# Patient Record
Sex: Male | Born: 1940
Health system: Southern US, Community
[De-identification: ages and names within clinical notes are randomized; demographics above are authoritative.]

## PROBLEM LIST (undated history)

## (undated) DIAGNOSIS — D494 Neoplasm of unspecified behavior of bladder: Secondary | ICD-10-CM

## (undated) DIAGNOSIS — C61 Malignant neoplasm of prostate: Secondary | ICD-10-CM

## (undated) DIAGNOSIS — A770 Spotted fever due to Rickettsia rickettsii: Secondary | ICD-10-CM

## (undated) DIAGNOSIS — C801 Malignant (primary) neoplasm, unspecified: Secondary | ICD-10-CM

## (undated) DIAGNOSIS — K08109 Complete loss of teeth, unspecified cause, unspecified class: Secondary | ICD-10-CM

## (undated) DIAGNOSIS — Z8619 Personal history of other infectious and parasitic diseases: Secondary | ICD-10-CM

## (undated) DIAGNOSIS — K219 Gastro-esophageal reflux disease without esophagitis: Secondary | ICD-10-CM

## (undated) DIAGNOSIS — M1990 Unspecified osteoarthritis, unspecified site: Secondary | ICD-10-CM

## (undated) DIAGNOSIS — H9192 Unspecified hearing loss, left ear: Secondary | ICD-10-CM

## (undated) DIAGNOSIS — Z973 Presence of spectacles and contact lenses: Secondary | ICD-10-CM

## (undated) DIAGNOSIS — I10 Essential (primary) hypertension: Secondary | ICD-10-CM

## (undated) DIAGNOSIS — B029 Zoster without complications: Secondary | ICD-10-CM

## (undated) DIAGNOSIS — R7303 Prediabetes: Secondary | ICD-10-CM

## (undated) DIAGNOSIS — Z974 Presence of external hearing-aid: Secondary | ICD-10-CM

## (undated) DIAGNOSIS — N401 Enlarged prostate with lower urinary tract symptoms: Secondary | ICD-10-CM

## (undated) HISTORY — PX: SHOULDER ARTHROSCOPY WITH ROTATOR CUFF REPAIR AND SUBACROMIAL DECOMPRESSION: SHX5686

## (undated) HISTORY — PX: TRANSURETHRAL RESECTION OF PROSTATE: SHX73

## (undated) HISTORY — PX: SHOULDER ARTHROSCOPY: SHX128

---
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
## 2008-06-14 ENCOUNTER — Emergency Department (HOSPITAL_COMMUNITY): Admission: EM | Admit: 2008-06-14 | Discharge: 2008-06-14 | Payer: Self-pay | Admitting: Emergency Medicine

## 2015-12-26 DIAGNOSIS — D2239 Melanocytic nevi of other parts of face: Secondary | ICD-10-CM | POA: Diagnosis not present

## 2015-12-26 DIAGNOSIS — D224 Melanocytic nevi of scalp and neck: Secondary | ICD-10-CM | POA: Diagnosis not present

## 2015-12-26 DIAGNOSIS — L821 Other seborrheic keratosis: Secondary | ICD-10-CM | POA: Diagnosis not present

## 2015-12-26 DIAGNOSIS — D225 Melanocytic nevi of trunk: Secondary | ICD-10-CM | POA: Diagnosis not present

## 2015-12-26 DIAGNOSIS — L570 Actinic keratosis: Secondary | ICD-10-CM | POA: Diagnosis not present

## 2015-12-26 DIAGNOSIS — Z85828 Personal history of other malignant neoplasm of skin: Secondary | ICD-10-CM | POA: Diagnosis not present

## 2015-12-26 DIAGNOSIS — L814 Other melanin hyperpigmentation: Secondary | ICD-10-CM | POA: Diagnosis not present

## 2015-12-26 DIAGNOSIS — C44519 Basal cell carcinoma of skin of other part of trunk: Secondary | ICD-10-CM | POA: Diagnosis not present

## 2016-02-12 DIAGNOSIS — E559 Vitamin D deficiency, unspecified: Secondary | ICD-10-CM | POA: Diagnosis not present

## 2016-02-12 DIAGNOSIS — I10 Essential (primary) hypertension: Secondary | ICD-10-CM | POA: Diagnosis not present

## 2016-02-12 DIAGNOSIS — R7301 Impaired fasting glucose: Secondary | ICD-10-CM | POA: Diagnosis not present

## 2016-02-14 DIAGNOSIS — K219 Gastro-esophageal reflux disease without esophagitis: Secondary | ICD-10-CM | POA: Diagnosis not present

## 2016-02-14 DIAGNOSIS — R7301 Impaired fasting glucose: Secondary | ICD-10-CM | POA: Diagnosis not present

## 2016-02-14 DIAGNOSIS — I10 Essential (primary) hypertension: Secondary | ICD-10-CM | POA: Diagnosis not present

## 2016-06-10 DIAGNOSIS — Z0100 Encounter for examination of eyes and vision without abnormal findings: Secondary | ICD-10-CM | POA: Diagnosis not present

## 2016-06-10 DIAGNOSIS — H524 Presbyopia: Secondary | ICD-10-CM | POA: Diagnosis not present

## 2016-06-24 DIAGNOSIS — Z85828 Personal history of other malignant neoplasm of skin: Secondary | ICD-10-CM | POA: Diagnosis not present

## 2016-06-24 DIAGNOSIS — L821 Other seborrheic keratosis: Secondary | ICD-10-CM | POA: Diagnosis not present

## 2016-06-24 DIAGNOSIS — L814 Other melanin hyperpigmentation: Secondary | ICD-10-CM | POA: Diagnosis not present

## 2016-06-24 DIAGNOSIS — L570 Actinic keratosis: Secondary | ICD-10-CM | POA: Diagnosis not present

## 2016-08-24 DIAGNOSIS — E559 Vitamin D deficiency, unspecified: Secondary | ICD-10-CM | POA: Diagnosis not present

## 2016-08-24 DIAGNOSIS — E782 Mixed hyperlipidemia: Secondary | ICD-10-CM | POA: Diagnosis not present

## 2016-08-24 DIAGNOSIS — R7301 Impaired fasting glucose: Secondary | ICD-10-CM | POA: Diagnosis not present

## 2016-08-26 DIAGNOSIS — I10 Essential (primary) hypertension: Secondary | ICD-10-CM | POA: Diagnosis not present

## 2016-08-26 DIAGNOSIS — Z6821 Body mass index (BMI) 21.0-21.9, adult: Secondary | ICD-10-CM | POA: Diagnosis not present

## 2016-08-26 DIAGNOSIS — R7301 Impaired fasting glucose: Secondary | ICD-10-CM | POA: Diagnosis not present

## 2016-08-26 DIAGNOSIS — K219 Gastro-esophageal reflux disease without esophagitis: Secondary | ICD-10-CM | POA: Diagnosis not present

## 2016-11-11 DIAGNOSIS — R05 Cough: Secondary | ICD-10-CM | POA: Diagnosis not present

## 2016-11-11 DIAGNOSIS — J060 Acute laryngopharyngitis: Secondary | ICD-10-CM | POA: Diagnosis not present

## 2016-12-29 DIAGNOSIS — D692 Other nonthrombocytopenic purpura: Secondary | ICD-10-CM | POA: Diagnosis not present

## 2016-12-29 DIAGNOSIS — D225 Melanocytic nevi of trunk: Secondary | ICD-10-CM | POA: Diagnosis not present

## 2016-12-29 DIAGNOSIS — L814 Other melanin hyperpigmentation: Secondary | ICD-10-CM | POA: Diagnosis not present

## 2016-12-29 DIAGNOSIS — L570 Actinic keratosis: Secondary | ICD-10-CM | POA: Diagnosis not present

## 2016-12-29 DIAGNOSIS — L821 Other seborrheic keratosis: Secondary | ICD-10-CM | POA: Diagnosis not present

## 2016-12-29 DIAGNOSIS — Z85828 Personal history of other malignant neoplasm of skin: Secondary | ICD-10-CM | POA: Diagnosis not present

## 2017-02-22 DIAGNOSIS — R7301 Impaired fasting glucose: Secondary | ICD-10-CM | POA: Diagnosis not present

## 2017-02-22 DIAGNOSIS — E559 Vitamin D deficiency, unspecified: Secondary | ICD-10-CM | POA: Diagnosis not present

## 2017-02-22 DIAGNOSIS — I10 Essential (primary) hypertension: Secondary | ICD-10-CM | POA: Diagnosis not present

## 2017-02-22 DIAGNOSIS — R972 Elevated prostate specific antigen [PSA]: Secondary | ICD-10-CM | POA: Diagnosis not present

## 2017-02-24 DIAGNOSIS — R7301 Impaired fasting glucose: Secondary | ICD-10-CM | POA: Diagnosis not present

## 2017-02-24 DIAGNOSIS — I10 Essential (primary) hypertension: Secondary | ICD-10-CM | POA: Diagnosis not present

## 2017-02-24 DIAGNOSIS — K219 Gastro-esophageal reflux disease without esophagitis: Secondary | ICD-10-CM | POA: Diagnosis not present

## 2017-02-24 DIAGNOSIS — Z0000 Encounter for general adult medical examination without abnormal findings: Secondary | ICD-10-CM | POA: Diagnosis not present

## 2017-02-24 DIAGNOSIS — Z6821 Body mass index (BMI) 21.0-21.9, adult: Secondary | ICD-10-CM | POA: Diagnosis not present

## 2017-06-10 DIAGNOSIS — H524 Presbyopia: Secondary | ICD-10-CM | POA: Diagnosis not present

## 2017-06-29 DIAGNOSIS — L814 Other melanin hyperpigmentation: Secondary | ICD-10-CM | POA: Diagnosis not present

## 2017-06-29 DIAGNOSIS — L578 Other skin changes due to chronic exposure to nonionizing radiation: Secondary | ICD-10-CM | POA: Diagnosis not present

## 2017-06-29 DIAGNOSIS — D692 Other nonthrombocytopenic purpura: Secondary | ICD-10-CM | POA: Diagnosis not present

## 2017-06-29 DIAGNOSIS — L821 Other seborrheic keratosis: Secondary | ICD-10-CM | POA: Diagnosis not present

## 2017-06-29 DIAGNOSIS — L570 Actinic keratosis: Secondary | ICD-10-CM | POA: Diagnosis not present

## 2017-06-29 DIAGNOSIS — L430 Hypertrophic lichen planus: Secondary | ICD-10-CM | POA: Diagnosis not present

## 2017-06-29 DIAGNOSIS — D225 Melanocytic nevi of trunk: Secondary | ICD-10-CM | POA: Diagnosis not present

## 2017-06-29 DIAGNOSIS — Z85828 Personal history of other malignant neoplasm of skin: Secondary | ICD-10-CM | POA: Diagnosis not present

## 2017-06-29 DIAGNOSIS — D485 Neoplasm of uncertain behavior of skin: Secondary | ICD-10-CM | POA: Diagnosis not present

## 2017-06-29 DIAGNOSIS — L720 Epidermal cyst: Secondary | ICD-10-CM | POA: Diagnosis not present

## 2017-08-25 DIAGNOSIS — I10 Essential (primary) hypertension: Secondary | ICD-10-CM | POA: Diagnosis not present

## 2017-08-25 DIAGNOSIS — R972 Elevated prostate specific antigen [PSA]: Secondary | ICD-10-CM | POA: Diagnosis not present

## 2017-08-25 DIAGNOSIS — R7301 Impaired fasting glucose: Secondary | ICD-10-CM | POA: Diagnosis not present

## 2017-08-27 DIAGNOSIS — R972 Elevated prostate specific antigen [PSA]: Secondary | ICD-10-CM | POA: Diagnosis not present

## 2017-08-27 DIAGNOSIS — I10 Essential (primary) hypertension: Secondary | ICD-10-CM | POA: Diagnosis not present

## 2017-08-27 DIAGNOSIS — K219 Gastro-esophageal reflux disease without esophagitis: Secondary | ICD-10-CM | POA: Diagnosis not present

## 2017-08-27 DIAGNOSIS — R7301 Impaired fasting glucose: Secondary | ICD-10-CM | POA: Diagnosis not present

## 2018-01-05 DIAGNOSIS — L821 Other seborrheic keratosis: Secondary | ICD-10-CM | POA: Diagnosis not present

## 2018-01-05 DIAGNOSIS — L570 Actinic keratosis: Secondary | ICD-10-CM | POA: Diagnosis not present

## 2018-01-05 DIAGNOSIS — Z85828 Personal history of other malignant neoplasm of skin: Secondary | ICD-10-CM | POA: Diagnosis not present

## 2018-01-05 DIAGNOSIS — D225 Melanocytic nevi of trunk: Secondary | ICD-10-CM | POA: Diagnosis not present

## 2018-01-05 DIAGNOSIS — L814 Other melanin hyperpigmentation: Secondary | ICD-10-CM | POA: Diagnosis not present

## 2018-02-23 DIAGNOSIS — R972 Elevated prostate specific antigen [PSA]: Secondary | ICD-10-CM | POA: Diagnosis not present

## 2018-02-23 DIAGNOSIS — R7301 Impaired fasting glucose: Secondary | ICD-10-CM | POA: Diagnosis not present

## 2018-02-23 DIAGNOSIS — I10 Essential (primary) hypertension: Secondary | ICD-10-CM | POA: Diagnosis not present

## 2018-02-23 DIAGNOSIS — E559 Vitamin D deficiency, unspecified: Secondary | ICD-10-CM | POA: Diagnosis not present

## 2018-02-25 DIAGNOSIS — K219 Gastro-esophageal reflux disease without esophagitis: Secondary | ICD-10-CM | POA: Diagnosis not present

## 2018-02-25 DIAGNOSIS — Z0000 Encounter for general adult medical examination without abnormal findings: Secondary | ICD-10-CM | POA: Diagnosis not present

## 2018-02-25 DIAGNOSIS — R972 Elevated prostate specific antigen [PSA]: Secondary | ICD-10-CM | POA: Diagnosis not present

## 2018-02-25 DIAGNOSIS — I10 Essential (primary) hypertension: Secondary | ICD-10-CM | POA: Diagnosis not present

## 2018-02-25 DIAGNOSIS — R7301 Impaired fasting glucose: Secondary | ICD-10-CM | POA: Diagnosis not present

## 2018-03-11 DIAGNOSIS — K219 Gastro-esophageal reflux disease without esophagitis: Secondary | ICD-10-CM | POA: Diagnosis not present

## 2018-03-11 DIAGNOSIS — R972 Elevated prostate specific antigen [PSA]: Secondary | ICD-10-CM | POA: Diagnosis not present

## 2018-03-11 DIAGNOSIS — I10 Essential (primary) hypertension: Secondary | ICD-10-CM | POA: Diagnosis not present

## 2018-03-11 DIAGNOSIS — Z0000 Encounter for general adult medical examination without abnormal findings: Secondary | ICD-10-CM | POA: Diagnosis not present

## 2018-03-11 DIAGNOSIS — R7301 Impaired fasting glucose: Secondary | ICD-10-CM | POA: Diagnosis not present

## 2018-03-11 DIAGNOSIS — Z6821 Body mass index (BMI) 21.0-21.9, adult: Secondary | ICD-10-CM | POA: Diagnosis not present

## 2018-03-11 DIAGNOSIS — R509 Fever, unspecified: Secondary | ICD-10-CM | POA: Diagnosis not present

## 2018-03-24 DIAGNOSIS — I10 Essential (primary) hypertension: Secondary | ICD-10-CM | POA: Diagnosis not present

## 2018-03-24 DIAGNOSIS — R7301 Impaired fasting glucose: Secondary | ICD-10-CM | POA: Diagnosis not present

## 2018-03-24 DIAGNOSIS — Z0000 Encounter for general adult medical examination without abnormal findings: Secondary | ICD-10-CM | POA: Diagnosis not present

## 2018-03-24 DIAGNOSIS — R972 Elevated prostate specific antigen [PSA]: Secondary | ICD-10-CM | POA: Diagnosis not present

## 2018-03-24 DIAGNOSIS — K219 Gastro-esophageal reflux disease without esophagitis: Secondary | ICD-10-CM | POA: Diagnosis not present

## 2018-03-27 ENCOUNTER — Encounter (HOSPITAL_COMMUNITY): Payer: Self-pay | Admitting: Emergency Medicine

## 2018-03-27 ENCOUNTER — Emergency Department (HOSPITAL_COMMUNITY)
Admission: EM | Admit: 2018-03-27 | Discharge: 2018-03-27 | Disposition: A | Discharge status: Home / Self Care | Payer: Medicare HMO | Attending: Emergency Medicine | Admitting: Emergency Medicine

## 2018-03-27 ENCOUNTER — Emergency Department (HOSPITAL_COMMUNITY): Payer: Medicare HMO

## 2018-03-27 ENCOUNTER — Other Ambulatory Visit: Payer: Self-pay

## 2018-03-27 DIAGNOSIS — R202 Paresthesia of skin: Secondary | ICD-10-CM | POA: Insufficient documentation

## 2018-03-27 DIAGNOSIS — Z79899 Other long term (current) drug therapy: Secondary | ICD-10-CM | POA: Diagnosis not present

## 2018-03-27 DIAGNOSIS — Z87891 Personal history of nicotine dependence: Secondary | ICD-10-CM | POA: Insufficient documentation

## 2018-03-27 DIAGNOSIS — I10 Essential (primary) hypertension: Secondary | ICD-10-CM | POA: Insufficient documentation

## 2018-03-27 DIAGNOSIS — R200 Anesthesia of skin: Secondary | ICD-10-CM | POA: Diagnosis present

## 2018-03-27 HISTORY — DX: Gastro-esophageal reflux disease without esophagitis: K21.9

## 2018-03-27 HISTORY — DX: Spotted fever due to Rickettsia rickettsii: A77.0

## 2018-03-27 HISTORY — DX: Essential (primary) hypertension: I10

## 2018-03-27 LAB — COMPREHENSIVE METABOLIC PANEL
ALBUMIN: 4.2 g/dL (ref 3.5–5.0)
ALK PHOS: 97 U/L (ref 38–126)
ALT: 19 U/L (ref 17–63)
AST: 17 U/L (ref 15–41)
Anion gap: 8 (ref 5–15)
BILIRUBIN TOTAL: 0.9 mg/dL (ref 0.3–1.2)
BUN: 14 mg/dL (ref 6–20)
CALCIUM: 9 mg/dL (ref 8.9–10.3)
CO2: 31 mmol/L (ref 22–32)
Chloride: 94 mmol/L — ABNORMAL LOW (ref 101–111)
Creatinine, Ser: 0.72 mg/dL (ref 0.61–1.24)
GFR calc Af Amer: >60 mL/min (ref >60)
Glucose, Bld: 117 mg/dL — ABNORMAL HIGH (ref 65–99)
Potassium: 3.5 mmol/L (ref 3.5–5.1)
Sodium: 133 mmol/L — ABNORMAL LOW (ref 135–145)
TOTAL PROTEIN: 7.1 g/dL (ref 6.5–8.1)

## 2018-03-27 LAB — CBC WITH DIFFERENTIAL/PLATELET
BASOS PCT: 0 %
Basophils Absolute: 0 10*3/uL (ref 0.0–0.1)
Eosinophils Absolute: 0.1 10*3/uL (ref 0.0–0.7)
Eosinophils Relative: 2 %
HEMATOCRIT: 45.3 % (ref 39.0–52.0)
HEMOGLOBIN: 15.2 g/dL (ref 13.0–17.0)
LYMPHS PCT: 28 %
Lymphs Abs: 1.7 10*3/uL (ref 0.7–4.0)
MCH: 30.2 pg (ref 26.0–34.0)
MCHC: 33.6 g/dL (ref 30.0–36.0)
MCV: 90.1 fL (ref 78.0–100.0)
MONOS PCT: 14 %
Monocytes Absolute: 0.9 10*3/uL (ref 0.1–1.0)
NEUTROS ABS: 3.5 10*3/uL (ref 1.7–7.7)
NEUTROS PCT: 56 %
Platelets: 337 10*3/uL (ref 150–400)
RBC: 5.03 MIL/uL (ref 4.22–5.81)
RDW: 13.2 % (ref 11.5–15.5)
WBC: 6.2 10*3/uL (ref 4.0–10.5)

## 2018-03-27 NOTE — Discharge Instructions (Addendum)
Blood work and CT scan of head and neck showed no life-threatening condition.  Continue your antibiotic.  Follow-up with primary care for further testing

## 2018-03-27 NOTE — ED Notes (Signed)
EDP at bedside  

## 2018-03-27 NOTE — ED Provider Notes (Signed)
[facility] EMERGENCY DEPARTMENT Provider Note   CSN: [id number] Arrival date & time: 03/27/18  0350     History   Chief Complaint Chief Complaint  Patient presents with  . Numbness    HPI Noah Burnett is a 77 y.o. male.  Patient presents with intermittent tingling/numbness in his left fingers/upper extremity for several days.  Past medical history significant for a tick bite on April 24 and subsequent fever and headache on May 2.  He had primary care doctor visits on May 2 May 16.  Blood work was drawn at that time.  He was started on doxycycline this past Thursday (3 days ago) because his "blood work was positive for RMSF".  No fever, chills, rash, headache at this time.  He is alert with no facial asymmetry, confusion, stiff neck, lower extremity weakness.  Family is wondering if this could be related to his recent diagnosis.  Severity of symptoms is mild to moderate.  Nothing makes symptoms better or worse.     Past Medical History:  Diagnosis Date  . GERD (gastroesophageal reflux disease)   . Hypertension   . [facility] spotted fever     There are no active problems to display for this patient.   Past Surgical History:  Procedure Laterality Date  . SHOULDER ARTHROSCOPY          Home Medications    Prior to Admission medications   Medication Sig Start Date End Date Taking? Authorizing Provider  amLODipine (NORVASC) 2.5 MG tablet Take 2.5 mg by mouth daily.  02/25/18  Yes [provider]  Cholecalciferol (VITAMIN D3) 1000 units CAPS Take 1 capsule by mouth daily.   Yes [provider]  doxycycline (ADOXA) 100 MG tablet Take 100 mg by mouth every 12 (twelve) hours. For 20 days. 03/24/18  Yes [provider]  hydrochlorothiazide (MICROZIDE) 12.5 MG capsule Take 12.5 mg by mouth daily.  02/25/18  Yes [provider]  omeprazole (PRILOSEC) 40 MG capsule Take 40 mg by mouth daily.  02/25/18  Yes [provider]     Family History History reviewed. No pertinent family history.  Social History Social History   Tobacco Use  . Smoking status: Former Research scientist (life sciences)  . Smokeless tobacco: Never Used  Substance Use Topics  . Alcohol use: Never    Frequency: Never  . Drug use: Never     Allergies   Patient has no known allergies.   Review of Systems Review of Systems  All other systems reviewed and are negative.    Physical Exam Updated Vital Signs BP 138/74   Pulse (!) 52   Temp (!) 97.3 F (36.3 C) (Oral)   Resp 16   Ht 5\' 7"  (1.702 m)   Wt 63.5 kg (140 lb)   SpO2 100%   BMI 21.93 kg/m   Physical Exam  Constitutional: He is oriented to person, place, and time. He appears well-developed and well-nourished.  HENT:  Head: Normocephalic and atraumatic.  Eyes: Conjunctivae are normal.  Neck: Neck supple.  Cardiovascular: Normal rate and regular rhythm.  Pulmonary/Chest: Effort normal and breath sounds normal.  Abdominal: Soft. Bowel sounds are normal.  Musculoskeletal: Normal range of motion.  Neurological: He is alert and oriented to person, place, and time.  Motor and sensory intact throughout.  Full range of motion of the left upper extremity.  Sensation intact.  Skin: Skin is warm and dry.  Psychiatric: He has a normal mood and affect. His behavior is normal.  Nursing note and vitals reviewed.    ED Treatments / Results  Labs (all labs ordered are listed, but only abnormal results are displayed) Labs Reviewed  COMPREHENSIVE METABOLIC PANEL - Abnormal; Notable for the following components:      Result Value   Sodium 133 (*)    Chloride 94 (*)    Glucose, Bld 117 (*)    All other components within normal limits  CBC WITH DIFFERENTIAL/PLATELET  ROCKY MTN SPOTTED FVR ABS PNL(IGG+IGM)  B. BURGDORFI ANTIBODIES    EKG EKG Interpretation  Date/Time:  Sunday Mar 27 2018 09:57:56 EDT Ventricular Rate:  63 PR Interval:    QRS Duration: 108 QT Interval:  426 QTC  Calculation: 437 R Axis:   63 Text Interpretation:  Sinus rhythm Baseline wander in lead(s) V1 Confirmed by Nat Christen [phone]) on 03/27/2018 10:32:53 AM   Radiology Ct Head Wo Contrast  Result Date: 03/27/2018 CLINICAL DATA:  Reports diagnosed with [place] spotted fever 1 week ago. Left arm numbness. EXAM: CT HEAD WITHOUT CONTRAST CT CERVICAL SPINE WITHOUT CONTRAST TECHNIQUE: Multidetector CT imaging of the head and cervical spine was performed following the standard protocol without intravenous contrast. Multiplanar CT image reconstructions of the cervical spine were also generated. COMPARISON:  None. FINDINGS: CT HEAD FINDINGS Brain: Ventricles, cisterns and other CSF spaces are within normal. There is mild chronic ischemic microvascular disease. Small old lacunar infarct over the inferior left lentiform nucleus. No mass, mass effect, shift of midline structures or acute hemorrhage. No evidence of acute infarction. Vascular: No hyperdense vessel or unexpected calcification. Skull: Normal. Negative for fracture or focal lesion. Sinuses/Orbits: No acute finding. Other: None. CT CERVICAL SPINE FINDINGS Alignment: Normal. Skull base and vertebrae: Vertebral body heights are normal. There is mild spondylosis throughout the cervical spine. Atlantoaxial articulation is within normal. There is uncovertebral joint spurring and mild facet arthropathy. No evidence of acute fracture or subluxation. Moderate bilateral neural foraminal narrowing at several levels due to adjacent bony spurring. Soft tissues and spinal canal: No prevertebral fluid or swelling. No visible canal hematoma. Disc levels:  Moderate disc space narrowing at the C5-6 level. Upper chest: Negative. Other: None. IMPRESSION: No acute intracranial findings. Mild chronic ischemic microvascular disease. No acute cervical spine findings. Mild spondylosis throughout the cervical spine with moderate bilateral neural foraminal narrowing at multiple  levels due to adjacent bony spurring. Moderate disc disease at the C5-6 level. Electronically Signed   By: Marin Olp M.D.   On: 03/27/2018 11:40   Ct Cervical Spine Wo Contrast  Result Date: 03/27/2018 CLINICAL DATA:  Reports diagnosed with [place] spotted fever 1 week ago. Left arm numbness. EXAM: CT HEAD WITHOUT CONTRAST CT CERVICAL SPINE WITHOUT CONTRAST TECHNIQUE: Multidetector CT imaging of the head and cervical spine was performed following the standard protocol without intravenous contrast. Multiplanar CT image reconstructions of the cervical spine were also generated. COMPARISON:  None. FINDINGS: CT HEAD FINDINGS Brain: Ventricles, cisterns and other CSF spaces are within normal. There is mild chronic ischemic microvascular disease. Small old lacunar infarct over the inferior left lentiform nucleus. No mass, mass effect, shift of midline structures or acute hemorrhage. No evidence of acute infarction. Vascular: No hyperdense vessel or unexpected calcification. Skull: Normal. Negative for fracture or focal lesion. Sinuses/Orbits: No acute finding. Other: None. CT CERVICAL SPINE FINDINGS Alignment: Normal. Skull base and vertebrae: Vertebral body heights are normal. There is mild spondylosis throughout the cervical spine. Atlantoaxial articulation is within normal. There is uncovertebral joint spurring and  mild facet arthropathy. No evidence of acute fracture or subluxation. Moderate bilateral neural foraminal narrowing at several levels due to adjacent bony spurring. Soft tissues and spinal canal: No prevertebral fluid or swelling. No visible canal hematoma. Disc levels:  Moderate disc space narrowing at the C5-6 level. Upper chest: Negative. Other: None. IMPRESSION: No acute intracranial findings. Mild chronic ischemic microvascular disease. No acute cervical spine findings. Mild spondylosis throughout the cervical spine with moderate bilateral neural foraminal narrowing at multiple levels due  to adjacent bony spurring. Moderate disc disease at the C5-6 level. Electronically Signed   By: Marin Olp M.D.   On: 03/27/2018 11:40    Procedures Procedures (including critical care time)  Medications Ordered in ED Medications - No data to display   Initial Impression / Assessment and Plan / ED Course  I have reviewed the triage vital signs and the nursing notes.  Pertinent labs & imaging results that were available during my care of the patient were reviewed by me and considered in my medical decision making (see chart for details).     Patient appears well.  He is concerned that his symptoms are related to the recent diagnosis of RMSF.  Blood work, CT head, CT cervical spine showed no acute pathology today.  This could be related to a cervical radiculopathy.  Could be sequelae of a stroke, but unlikely.  These findings were discussed with the patient and his children.  He will continue doxycycline and follow-up with his primary care doctor for possible MRI of his brain and/or cervical spine  Final Clinical Impressions(s) / ED Diagnoses   Final diagnoses:  Tingling of left upper extremity    ED Discharge Orders    None       Nat Christen, MD 03/27/18 1302

## 2018-03-27 NOTE — ED Notes (Signed)
EDP at bedside updating patient and family. 

## 2018-03-27 NOTE — ED Notes (Signed)
Patient transported to CT 

## 2018-03-27 NOTE — ED Triage Notes (Signed)
PT reports recent dx with [facility] Spotted Fever about a week ago.  States his left arm has been numb since his diagnosis and is unable to feel as well on that arm.  No focal neuro weakness noted.

## 2018-03-29 LAB — ROCKY MTN SPOTTED FVR ABS PNL(IGG+IGM)
RMSF IGM: 0.21 {index} (ref 0.00–0.89)
RMSF IgG: POSITIVE — AB

## 2018-03-29 LAB — RMSF, IGG, IFA: RMSF, IGG, IFA: 1:64 {titer} — ABNORMAL HIGH

## 2018-03-29 LAB — B. BURGDORFI ANTIBODIES: B burgdorferi Ab IgG+IgM: <0.91 {ISR} (ref 0.00–0.90)

## 2018-04-08 DIAGNOSIS — M25512 Pain in left shoulder: Secondary | ICD-10-CM | POA: Diagnosis not present

## 2018-06-17 DIAGNOSIS — H524 Presbyopia: Secondary | ICD-10-CM | POA: Diagnosis not present

## 2018-07-06 DIAGNOSIS — D485 Neoplasm of uncertain behavior of skin: Secondary | ICD-10-CM | POA: Diagnosis not present

## 2018-07-06 DIAGNOSIS — L739 Follicular disorder, unspecified: Secondary | ICD-10-CM | POA: Diagnosis not present

## 2018-07-06 DIAGNOSIS — L570 Actinic keratosis: Secondary | ICD-10-CM | POA: Diagnosis not present

## 2018-07-06 DIAGNOSIS — C44622 Squamous cell carcinoma of skin of right upper limb, including shoulder: Secondary | ICD-10-CM | POA: Diagnosis not present

## 2018-07-06 DIAGNOSIS — L814 Other melanin hyperpigmentation: Secondary | ICD-10-CM | POA: Diagnosis not present

## 2018-07-06 DIAGNOSIS — Z85828 Personal history of other malignant neoplasm of skin: Secondary | ICD-10-CM | POA: Diagnosis not present

## 2018-07-06 DIAGNOSIS — C44329 Squamous cell carcinoma of skin of other parts of face: Secondary | ICD-10-CM | POA: Diagnosis not present

## 2018-07-06 DIAGNOSIS — L821 Other seborrheic keratosis: Secondary | ICD-10-CM | POA: Diagnosis not present

## 2018-08-10 DIAGNOSIS — C44329 Squamous cell carcinoma of skin of other parts of face: Secondary | ICD-10-CM | POA: Diagnosis not present

## 2018-08-10 DIAGNOSIS — Z85828 Personal history of other malignant neoplasm of skin: Secondary | ICD-10-CM | POA: Diagnosis not present

## 2018-08-17 DIAGNOSIS — Z4802 Encounter for removal of sutures: Secondary | ICD-10-CM | POA: Diagnosis not present

## 2018-08-25 DIAGNOSIS — R509 Fever, unspecified: Secondary | ICD-10-CM | POA: Diagnosis not present

## 2018-08-25 DIAGNOSIS — K219 Gastro-esophageal reflux disease without esophagitis: Secondary | ICD-10-CM | POA: Diagnosis not present

## 2018-08-25 DIAGNOSIS — R7301 Impaired fasting glucose: Secondary | ICD-10-CM | POA: Diagnosis not present

## 2018-08-25 DIAGNOSIS — I10 Essential (primary) hypertension: Secondary | ICD-10-CM | POA: Diagnosis not present

## 2018-08-25 DIAGNOSIS — R972 Elevated prostate specific antigen [PSA]: Secondary | ICD-10-CM | POA: Diagnosis not present

## 2018-08-25 DIAGNOSIS — Z6821 Body mass index (BMI) 21.0-21.9, adult: Secondary | ICD-10-CM | POA: Diagnosis not present

## 2018-08-25 DIAGNOSIS — Z0000 Encounter for general adult medical examination without abnormal findings: Secondary | ICD-10-CM | POA: Diagnosis not present

## 2018-08-30 DIAGNOSIS — C4492 Squamous cell carcinoma of skin, unspecified: Secondary | ICD-10-CM | POA: Diagnosis not present

## 2018-08-30 DIAGNOSIS — Z6822 Body mass index (BMI) 22.0-22.9, adult: Secondary | ICD-10-CM | POA: Diagnosis not present

## 2018-08-30 DIAGNOSIS — K219 Gastro-esophageal reflux disease without esophagitis: Secondary | ICD-10-CM | POA: Diagnosis not present

## 2018-08-30 DIAGNOSIS — I10 Essential (primary) hypertension: Secondary | ICD-10-CM | POA: Diagnosis not present

## 2018-08-30 DIAGNOSIS — R972 Elevated prostate specific antigen [PSA]: Secondary | ICD-10-CM | POA: Diagnosis not present

## 2018-08-30 DIAGNOSIS — R7301 Impaired fasting glucose: Secondary | ICD-10-CM | POA: Diagnosis not present

## 2018-09-01 DIAGNOSIS — N402 Nodular prostate without lower urinary tract symptoms: Secondary | ICD-10-CM | POA: Diagnosis not present

## 2018-09-01 DIAGNOSIS — R972 Elevated prostate specific antigen [PSA]: Secondary | ICD-10-CM | POA: Diagnosis not present

## 2018-11-21 DIAGNOSIS — R972 Elevated prostate specific antigen [PSA]: Secondary | ICD-10-CM | POA: Diagnosis not present

## 2018-11-30 DIAGNOSIS — C61 Malignant neoplasm of prostate: Secondary | ICD-10-CM | POA: Diagnosis not present

## 2019-01-06 DIAGNOSIS — L570 Actinic keratosis: Secondary | ICD-10-CM | POA: Diagnosis not present

## 2019-01-06 DIAGNOSIS — Z85828 Personal history of other malignant neoplasm of skin: Secondary | ICD-10-CM | POA: Diagnosis not present

## 2019-01-06 DIAGNOSIS — L821 Other seborrheic keratosis: Secondary | ICD-10-CM | POA: Diagnosis not present

## 2019-01-06 DIAGNOSIS — C44519 Basal cell carcinoma of skin of other part of trunk: Secondary | ICD-10-CM | POA: Diagnosis not present

## 2019-01-06 DIAGNOSIS — L814 Other melanin hyperpigmentation: Secondary | ICD-10-CM | POA: Diagnosis not present

## 2019-01-06 DIAGNOSIS — D225 Melanocytic nevi of trunk: Secondary | ICD-10-CM | POA: Diagnosis not present

## 2019-03-06 DIAGNOSIS — K219 Gastro-esophageal reflux disease without esophagitis: Secondary | ICD-10-CM | POA: Diagnosis not present

## 2019-03-06 DIAGNOSIS — I10 Essential (primary) hypertension: Secondary | ICD-10-CM | POA: Diagnosis not present

## 2019-03-06 DIAGNOSIS — R972 Elevated prostate specific antigen [PSA]: Secondary | ICD-10-CM | POA: Diagnosis not present

## 2019-03-06 DIAGNOSIS — R7301 Impaired fasting glucose: Secondary | ICD-10-CM | POA: Diagnosis not present

## 2019-03-06 DIAGNOSIS — Z0001 Encounter for general adult medical examination with abnormal findings: Secondary | ICD-10-CM | POA: Diagnosis not present

## 2019-04-13 DIAGNOSIS — C61 Malignant neoplasm of prostate: Secondary | ICD-10-CM | POA: Diagnosis not present

## 2019-04-19 DIAGNOSIS — I10 Essential (primary) hypertension: Secondary | ICD-10-CM | POA: Diagnosis not present

## 2019-04-19 DIAGNOSIS — R7301 Impaired fasting glucose: Secondary | ICD-10-CM | POA: Diagnosis not present

## 2019-04-19 DIAGNOSIS — R972 Elevated prostate specific antigen [PSA]: Secondary | ICD-10-CM | POA: Diagnosis not present

## 2019-04-19 DIAGNOSIS — C61 Malignant neoplasm of prostate: Secondary | ICD-10-CM | POA: Diagnosis not present

## 2019-04-25 DIAGNOSIS — L258 Unspecified contact dermatitis due to other agents: Secondary | ICD-10-CM | POA: Diagnosis not present

## 2019-04-25 DIAGNOSIS — R7303 Prediabetes: Secondary | ICD-10-CM | POA: Diagnosis not present

## 2019-04-25 DIAGNOSIS — I10 Essential (primary) hypertension: Secondary | ICD-10-CM | POA: Diagnosis not present

## 2019-04-25 DIAGNOSIS — R972 Elevated prostate specific antigen [PSA]: Secondary | ICD-10-CM | POA: Diagnosis not present

## 2019-04-25 DIAGNOSIS — W450XXS Nail entering through skin, sequela: Secondary | ICD-10-CM | POA: Diagnosis not present

## 2019-04-25 DIAGNOSIS — W57XXXA Bitten or stung by nonvenomous insect and other nonvenomous arthropods, initial encounter: Secondary | ICD-10-CM | POA: Diagnosis not present

## 2019-04-25 DIAGNOSIS — C61 Malignant neoplasm of prostate: Secondary | ICD-10-CM | POA: Diagnosis not present

## 2019-04-25 DIAGNOSIS — K219 Gastro-esophageal reflux disease without esophagitis: Secondary | ICD-10-CM | POA: Diagnosis not present

## 2019-04-25 DIAGNOSIS — Z23 Encounter for immunization: Secondary | ICD-10-CM | POA: Diagnosis not present

## 2019-04-25 DIAGNOSIS — R7301 Impaired fasting glucose: Secondary | ICD-10-CM | POA: Diagnosis not present

## 2019-06-28 ENCOUNTER — Other Ambulatory Visit: Payer: Self-pay | Admitting: Urology

## 2019-06-28 DIAGNOSIS — C61 Malignant neoplasm of prostate: Secondary | ICD-10-CM

## 2019-07-11 DIAGNOSIS — L821 Other seborrheic keratosis: Secondary | ICD-10-CM | POA: Diagnosis not present

## 2019-07-11 DIAGNOSIS — L814 Other melanin hyperpigmentation: Secondary | ICD-10-CM | POA: Diagnosis not present

## 2019-07-11 DIAGNOSIS — L570 Actinic keratosis: Secondary | ICD-10-CM | POA: Diagnosis not present

## 2019-07-11 DIAGNOSIS — Z85828 Personal history of other malignant neoplasm of skin: Secondary | ICD-10-CM | POA: Diagnosis not present

## 2019-07-21 ENCOUNTER — Other Ambulatory Visit: Payer: Self-pay

## 2019-07-21 ENCOUNTER — Ambulatory Visit
Admission: EM | Admit: 2019-07-21 | Discharge: 2019-07-21 | Disposition: A | Discharge status: Home / Self Care | Payer: Medicare HMO | Attending: Emergency Medicine | Admitting: Emergency Medicine

## 2019-07-21 DIAGNOSIS — W57XXXA Bitten or stung by nonvenomous insect and other nonvenomous arthropods, initial encounter: Secondary | ICD-10-CM | POA: Diagnosis not present

## 2019-07-21 DIAGNOSIS — M791 Myalgia, unspecified site: Secondary | ICD-10-CM | POA: Diagnosis not present

## 2019-07-21 DIAGNOSIS — Z8619 Personal history of other infectious and parasitic diseases: Secondary | ICD-10-CM | POA: Diagnosis not present

## 2019-07-21 DIAGNOSIS — S30861A Insect bite (nonvenomous) of abdominal wall, initial encounter: Secondary | ICD-10-CM | POA: Diagnosis not present

## 2019-07-21 DIAGNOSIS — M2550 Pain in unspecified joint: Secondary | ICD-10-CM

## 2019-07-21 DIAGNOSIS — R519 Headache, unspecified: Secondary | ICD-10-CM

## 2019-07-21 DIAGNOSIS — R51 Headache: Secondary | ICD-10-CM | POA: Diagnosis not present

## 2019-07-21 HISTORY — DX: Malignant neoplasm of prostate: C61

## 2019-07-21 MED ORDER — DOXYCYCLINE HYCLATE 100 MG PO CAPS: 100.0000 mg | ORAL_CAPSULE | Freq: Two times a day (BID) | ORAL | 0 refills | Status: AC

## 2019-07-21 NOTE — ED Provider Notes (Signed)
[facility]   [id number] 07/21/19 Arrival Time: 1253  CC: tick bite  SUBJECTIVE:  Noah Burnett is a 78 y.o. male hx significant for cancer, GERD, HTN, RMSF, complains of removing a tick from his belly button 2 weeks ago.  Localizes the bite to abdomen.  Removed tick at home.  Reports previous hx of tick bite and was treated for RMSF.  Complains of feeling achy, HA, myalgias, and arthralgias.  Denies fever, chills, nausea, vomiting, dizziness, weakness, rash, or abdominal pain.   ROS: As per HPI.  All other pertinent ROS negative.     Past Medical History:  Diagnosis Date  . Cancer ([place])   . GERD (gastroesophageal reflux disease)   . Hypertension   . Prostate cancer ([place])   . [place] spotted fever    Past Surgical History:  Procedure Laterality Date  . SHOULDER ARTHROSCOPY     No Known Allergies No current facility-administered medications on file prior to encounter.    Current Outpatient Medications on File Prior to Encounter  Medication Sig Dispense Refill  . hydrochlorothiazide (MICROZIDE) 12.5 MG capsule Take 12.5 mg by mouth daily.   1  . omeprazole (PRILOSEC) 40 MG capsule Take 40 mg by mouth daily.   1  . [DISCONTINUED] amLODipine (NORVASC) 2.5 MG tablet Take 2.5 mg by mouth daily.   1   Social History   Socioeconomic History  . Marital status: Single    Spouse name: Not on file  . Number of children: Not on file  . Years of education: Not on file  . Highest education level: Not on file  Occupational History  . Not on file  Social Needs  . Financial resource strain: Not on file  . Food insecurity    Worry: Not on file    Inability: Not on file  . Transportation needs    Medical: Not on file    Non-medical: Not on file  Tobacco Use  . Smoking status: Former Research scientist (life sciences)  . Smokeless tobacco: Never Used  Substance and Sexual Activity  . Alcohol use: Never    Frequency: Never  . Drug use: Never  . Sexual activity: Not on file  Lifestyle   . Physical activity    Days per week: Not on file    Minutes per session: Not on file  . Stress: Not on file  Relationships  . Social Herbalist on phone: Not on file    Gets together: Not on file    Attends religious service: Not on file    Active member of club or organization: Not on file    Attends meetings of clubs or organizations: Not on file    Relationship status: Not on file  . Intimate partner violence    Fear of current or ex partner: Not on file    Emotionally abused: Not on file    Physically abused: Not on file    Forced sexual activity: Not on file  Other Topics Concern  . Not on file  Social History Narrative  . Not on file   No family history on file.  OBJECTIVE: Vitals:   07/21/19 1307  BP: 137/73  Pulse: 64  Resp: 18  Temp: 98.3 F (36.8 C)  TempSrc: Oral  SpO2: 97%    General appearance: alert; no distress Head: NCAT ENT: PERRL, EOMI grossly; oropharynx clear Lungs: clear to auscultation bilaterally Heart: regular rate and rhythm.  Radial pulse 2+ bilaterally Extremities: no edema Abdomen:  soft, nondistended, normal active bowel sounds; nontender to palpation; no guarding  Skin: warm and dry; small punctate lesion with mild surrounding erythema over lower anterior abdomen; no obvious rashes, bleeding, or discharge Psychological: alert and cooperative; normal mood and affect  ASSESSMENT & PLAN:  1. Tick bite, initial encounter   2. Arthralgia, unspecified joint   3. Myalgia   4. Acute nonintractable headache, unspecified headache type     Meds ordered this encounter  Medications  . doxycycline (VIBRAMYCIN) 100 MG capsule    Sig: Take 1 capsule (100 mg total) by mouth 2 (two) times daily for 14 days.    Dispense:  28 capsule    Refill:  0    Order Specific Question:   Supervising Provider    Answer:   Raylene Everts [id number]   Based on hx of tick bite and symptoms will treat for possible tick born illness Doxycycline  prescribed.  Take as directed and to completion To prevent tick bites, wear long sleeves, long pants, and light colors. Use insect repellent. Follow the instructions on the bottle. If the tick is biting, do not try to remove it with heat, alcohol, petroleum jelly, or fingernail polish. Use tweezers, curved forceps, or a tick-removal tool to grasp the tick. Gently pull up until the tick lets go. Do not twist or jerk the tick. Do not squeeze or crush the tick. Follow up with PCP next week for recheck and to ensure your symptoms are improving Return here or go to ER if you have any new or worsening symptoms (rash, nausea, vomiting, fever, chills, headache, fatigue, etc...)   Reviewed expectations re: course of current medical issues. Questions answered. Outlined signs and symptoms indicating need for more acute intervention. Patient verbalized understanding. After Visit Summary given.   Lestine Box, PA-C 07/21/19 1338

## 2019-07-21 NOTE — Discharge Instructions (Signed)
Based on hx of tick bite and symptoms will treat for possible tick born illness Doxycycline prescribed.  Take as directed and to completion To prevent tick bites, wear long sleeves, long pants, and light colors. Use insect repellent. Follow the instructions on the bottle. If the tick is biting, do not try to remove it with heat, alcohol, petroleum jelly, or fingernail polish. Use tweezers, curved forceps, or a tick-removal tool to grasp the tick. Gently pull up until the tick lets go. Do not twist or jerk the tick. Do not squeeze or crush the tick. Follow up with PCP next week for recheck and to ensure your symptoms are improving Return here or go to ER if you have any new or worsening symptoms (rash, nausea, vomiting, fever, chills, headache, fatigue, etc...)

## 2019-07-21 NOTE — ED Triage Notes (Signed)
Pt presents to UC w/ c/o feeling achy, headache, abdominal pain x1 week. Pt reports he pulled a tick from his bellybutton 2 weeks ago. Pt reports insect bite in groin area. Pt reports having [place] spotted fever this time last year.

## 2019-09-15 DIAGNOSIS — Z0000 Encounter for general adult medical examination without abnormal findings: Secondary | ICD-10-CM | POA: Diagnosis not present

## 2019-09-15 DIAGNOSIS — R7301 Impaired fasting glucose: Secondary | ICD-10-CM | POA: Diagnosis not present

## 2019-09-15 DIAGNOSIS — R972 Elevated prostate specific antigen [PSA]: Secondary | ICD-10-CM | POA: Diagnosis not present

## 2019-09-15 DIAGNOSIS — I10 Essential (primary) hypertension: Secondary | ICD-10-CM | POA: Diagnosis not present

## 2019-09-15 DIAGNOSIS — K219 Gastro-esophageal reflux disease without esophagitis: Secondary | ICD-10-CM | POA: Diagnosis not present

## 2019-10-19 ENCOUNTER — Ambulatory Visit
Admission: RE | Admit: 2019-10-19 | Discharge: 2019-10-19 | Disposition: A | Discharge status: Home / Self Care | Payer: Medicare HMO | Source: Ambulatory Visit | Attending: Urology | Admitting: Urology

## 2019-10-19 ENCOUNTER — Other Ambulatory Visit: Payer: Self-pay

## 2019-10-19 DIAGNOSIS — C61 Malignant neoplasm of prostate: Secondary | ICD-10-CM

## 2019-10-19 MED ORDER — GADOBENATE DIMEGLUMINE 529 MG/ML IV SOLN
13.0000 mL | Freq: Once | INTRAVENOUS | Status: AC | PRN
  Administered 2019-10-19: 13 mL via INTRAVENOUS

## 2019-10-23 ENCOUNTER — Other Ambulatory Visit: Payer: Self-pay

## 2019-10-23 ENCOUNTER — Encounter (INDEPENDENT_AMBULATORY_CARE_PROVIDER_SITE_OTHER): Payer: Self-pay | Admitting: Otolaryngology

## 2019-10-23 ENCOUNTER — Ambulatory Visit (INDEPENDENT_AMBULATORY_CARE_PROVIDER_SITE_OTHER): Payer: Medicare HMO | Admitting: Otolaryngology

## 2019-10-23 VITALS — Temp 97.9°F

## 2019-10-23 DIAGNOSIS — H903 Sensorineural hearing loss, bilateral: Secondary | ICD-10-CM | POA: Diagnosis not present

## 2019-10-23 DIAGNOSIS — H6123 Impacted cerumen, bilateral: Secondary | ICD-10-CM

## 2019-10-23 NOTE — Progress Notes (Signed)
HPI: Noah Burnett is a 78 y.o. male who presents for evaluation of cerumen buildup.  [facility].  He noticed sudden loss of hearing in the left ear over 20 years ago.  He has noticed gradual decline in his hearing.  No ear pain.  [facility] and referred here to have the ears cleaned first..  Past Medical History:  Diagnosis Date  . Cancer ([place])   . GERD (gastroesophageal reflux disease)   . Hypertension   . Prostate cancer ([place])   . [place] spotted fever    Past Surgical History:  Procedure Laterality Date  . SHOULDER ARTHROSCOPY     Social History   Socioeconomic History  . Marital status: Single    Spouse name: Not on file  . Number of children: Not on file  . Years of education: Not on file  . Highest education level: Not on file  Occupational History  . Not on file  Tobacco Use  . Smoking status: Former Smoker    Packs/day: 1.00    Years: 36.00    Pack years: 36.00    Types: Cigarettes    Start date: 1964    Quit date: 2000    Years since quitting: 20.9  . Smokeless tobacco: Never Used  Substance and Sexual Activity  . Alcohol use: Never  . Drug use: Never  . Sexual activity: Not on file  Other Topics Concern  . Not on file  Social History Narrative  . Not on file   Social Determinants of Health   Financial Resource Strain:   . Difficulty of Paying Living Expenses: Not on file  Food Insecurity:   . Worried About Charity fundraiser in the Last Year: Not on file  . Ran Out of Food in the Last Year: Not on file  Transportation Needs:   . Lack of Transportation (Medical): Not on file  . Lack of Transportation (Non-Medical): Not on file  Physical Activity:   . Days of Exercise per Week: Not on file  . Minutes of Exercise per Session: Not on file  Stress:   . Feeling of Stress : Not on file  Social Connections:   . Frequency of Communication with Friends and Family: Not on file  .  Frequency of Social Gatherings with Friends and Family: Not on file  . Attends Religious Services: Not on file  . Active Member of Clubs or Organizations: Not on file  . Attends Archivist Meetings: Not on file  . Marital Status: Not on file   No family history on file. No Known Allergies Prior to Admission medications   Medication Sig Start Date End Date Taking? Authorizing Provider  hydrochlorothiazide (MICROZIDE) 12.5 MG capsule Take 12.5 mg by mouth daily.  02/25/18  Yes [provider]  omeprazole (PRILOSEC) 40 MG capsule Take 40 mg by mouth daily.  02/25/18  Yes [provider]  amLODipine (NORVASC) 2.5 MG tablet Take 2.5 mg by mouth daily.  02/25/18 07/21/19  [provider]     Positive ROS: Negative  All other systems have been reviewed and were otherwise negative with the exception of those mentioned in the HPI and as above.  Physical Exam: Constitutional: Alert, well-appearing, no acute distress Ears: External ears without lesions or tenderness. Ear canals he has large amount of wax in the right ear canal that was cleaned with suction and curettes.  He had a small  amount of wax in the left ear canal that was cleaned with curettes.  Both TMs were clear. Nasal: External nose without lesions. Clear nasal passages Oral: Oropharynx clear. Neck: No palpable adenopathy or masses Respiratory: Breathing comfortably  Skin: No facial/neck lesions or rash noted.  Cerumen impaction removal  Date/Time: 10/23/2019 1:32 PM Performed by: Rozetta Nunnery, MD Authorized by: Rozetta Nunnery, MD   Consent:    Consent obtained:  Verbal   Consent given by:  Patient   Risks discussed:  Pain and bleeding Procedure details:    Location:  L ear and R ear   Procedure type: curette and suction   Post-procedure details:    Inspection:  TM intact and canal normal   Hearing quality:  Improved   Patient tolerance of procedure:  Tolerated well, no  immediate complications    Assessment: Cerumen impaction right worse than left. SNHL left worse than right  Plan: He will follow-up with audiology concerning possible hearing aids.  Radene Journey, MD2

## 2019-10-24 DIAGNOSIS — R7301 Impaired fasting glucose: Secondary | ICD-10-CM | POA: Diagnosis not present

## 2019-10-24 DIAGNOSIS — I10 Essential (primary) hypertension: Secondary | ICD-10-CM | POA: Diagnosis not present

## 2019-10-24 DIAGNOSIS — R7303 Prediabetes: Secondary | ICD-10-CM | POA: Diagnosis not present

## 2019-10-25 DIAGNOSIS — R972 Elevated prostate specific antigen [PSA]: Secondary | ICD-10-CM | POA: Diagnosis not present

## 2019-10-25 DIAGNOSIS — R351 Nocturia: Secondary | ICD-10-CM | POA: Diagnosis not present

## 2019-10-25 DIAGNOSIS — N403 Nodular prostate with lower urinary tract symptoms: Secondary | ICD-10-CM | POA: Diagnosis not present

## 2019-10-25 DIAGNOSIS — C61 Malignant neoplasm of prostate: Secondary | ICD-10-CM | POA: Diagnosis not present

## 2019-10-31 DIAGNOSIS — K219 Gastro-esophageal reflux disease without esophagitis: Secondary | ICD-10-CM | POA: Diagnosis not present

## 2019-10-31 DIAGNOSIS — R7301 Impaired fasting glucose: Secondary | ICD-10-CM | POA: Diagnosis not present

## 2019-10-31 DIAGNOSIS — R7303 Prediabetes: Secondary | ICD-10-CM | POA: Diagnosis not present

## 2019-10-31 DIAGNOSIS — C61 Malignant neoplasm of prostate: Secondary | ICD-10-CM | POA: Diagnosis not present

## 2019-10-31 DIAGNOSIS — I10 Essential (primary) hypertension: Secondary | ICD-10-CM | POA: Diagnosis not present

## 2019-10-31 DIAGNOSIS — R972 Elevated prostate specific antigen [PSA]: Secondary | ICD-10-CM | POA: Diagnosis not present

## 2020-01-03 DIAGNOSIS — D075 Carcinoma in situ of prostate: Secondary | ICD-10-CM | POA: Diagnosis not present

## 2020-01-03 DIAGNOSIS — C61 Malignant neoplasm of prostate: Secondary | ICD-10-CM | POA: Diagnosis not present

## 2020-01-03 DIAGNOSIS — N281 Cyst of kidney, acquired: Secondary | ICD-10-CM | POA: Diagnosis not present

## 2020-01-08 DIAGNOSIS — L570 Actinic keratosis: Secondary | ICD-10-CM | POA: Diagnosis not present

## 2020-01-08 DIAGNOSIS — L814 Other melanin hyperpigmentation: Secondary | ICD-10-CM | POA: Diagnosis not present

## 2020-01-08 DIAGNOSIS — L821 Other seborrheic keratosis: Secondary | ICD-10-CM | POA: Diagnosis not present

## 2020-01-08 DIAGNOSIS — L84 Corns and callosities: Secondary | ICD-10-CM | POA: Diagnosis not present

## 2020-01-08 DIAGNOSIS — Z85828 Personal history of other malignant neoplasm of skin: Secondary | ICD-10-CM | POA: Diagnosis not present

## 2020-01-17 DIAGNOSIS — C670 Malignant neoplasm of trigone of bladder: Secondary | ICD-10-CM | POA: Diagnosis not present

## 2020-01-17 DIAGNOSIS — C61 Malignant neoplasm of prostate: Secondary | ICD-10-CM | POA: Diagnosis not present

## 2020-01-18 ENCOUNTER — Other Ambulatory Visit: Payer: Self-pay | Admitting: Urology

## 2020-01-25 ENCOUNTER — Encounter (HOSPITAL_BASED_OUTPATIENT_CLINIC_OR_DEPARTMENT_OTHER): Payer: Self-pay | Admitting: Urology

## 2020-01-25 ENCOUNTER — Other Ambulatory Visit: Payer: Self-pay

## 2020-01-25 NOTE — Progress Notes (Signed)
Spoke w/ via phone for pre-op interview--- PT Lab needs dos---- Istat 8  And EKG             Lab results------ no COVID test ------ 01-29-2020 @ 0800 @AP  Arrive at ------- 0915 NPO after ------ [place] Medications to take morning of surgery ----- Prilosed  W/ sips of water Diabetic medication ----- does not take any Patient Special Instructions ----- n/a Pre-Op special Istructions ----- n/a Patient verbalized understanding of instructions that were given at this phone interview. Patient denies shortness of breath, chest pain, fever, cough a this phone interview.

## 2020-01-29 ENCOUNTER — Other Ambulatory Visit (HOSPITAL_COMMUNITY)
Admission: RE | Admit: 2020-01-29 | Discharge: 2020-01-29 | Disposition: A | Discharge status: Home / Self Care | Payer: Medicare HMO | Source: Ambulatory Visit | Attending: Urology | Admitting: Urology

## 2020-01-29 ENCOUNTER — Other Ambulatory Visit: Payer: Self-pay

## 2020-01-29 DIAGNOSIS — Z01812 Encounter for preprocedural laboratory examination: Secondary | ICD-10-CM | POA: Diagnosis not present

## 2020-01-29 DIAGNOSIS — Z20822 Contact with and (suspected) exposure to covid-19: Secondary | ICD-10-CM | POA: Insufficient documentation

## 2020-01-30 LAB — SARS CORONAVIRUS 2 (TAT 6-24 HRS): SARS Coronavirus 2: NEGATIVE

## 2020-01-31 NOTE — H&P (Signed)
CC: I have prostate cancer.  HPI: Noah Burnett is a 79 year-old male established patient who is here evaluation for treatment of prostate cancer.  His prostate cancer was diagnosed 11/21/2018. He does have the pathology report from his biopsy. His PSA at his time of diagnosis was 7.8. His most recent PSA is 7.7.   Noah Burnett returns today in f/u from his recent prostate biospy for his history of prostate cancer on active surveillance. His PSA is back up to 7.7. It was 7.8 prior to the biopsy. He had an 60mm right apical nodule on his exam. His IPSS is 6. An MRIP prior to this visit showed 3 small PIRADS 3 lesions. He has no associated signs or symptoms.   Path: T2a Nx Mx Gleason 7(3+4) prostate cancer with 2 cores of low volume gleason 7(3+4) disease at the right base on from the standard template and one from an ROI. He has a single core with Gleason 6 in the right apex with 5% involvement in the right apical ROI. This is essentially unchanged from his prior biopsy. He was noted to have a bladder lesion on the [place] and returns for cystoscopy today. He has >60 RBCs/HPF on UA today.   Prostate volume: 172ml which is up from 189ml   PSAD: 0.077   SHIM 23   IPSS 13   [facility] nomogram OCD 42% ECE 54% LNI 5% SVI 4%.     AUA Symptom Score: He never has the sensation of not emptying his bladder completely after finishing urinating. Less than 50% of the time he has to urinate again fewer than two hours after he has finished urinating. He does not have to stop and start again several times when he urinates. Less than 20% of the time he finds it difficult to postpone urination. Less than 20% of the time he has a weak urinary stream. He never has to push or strain to begin urination. He has to get up to urinate 2 times from the time he goes to bed until the time he gets up in the morning.   Calculated AUA Symptom Score: 6    ALLERGIES: None   MEDICATIONS: Hydrochlorothiazide 12.5 mg tablet  Omeprazole 40 mg  capsule,delayed release  Amlodipine Besylate 2.5 mg tablet  Diazepam 10 mg tablet 1-2 tablet PO Daily Take one hour prior to procedure.  Levofloxacin 750 mg tablet 1 po 1 hour prior to the procedure     GU PSH: Prostate Needle Biopsy - 01/03/2020, 11/21/2018     NON-GU PSH: Removal of skin cancer Shoulder Surgery (Unspecified) Surgical Pathology, Gross And Microscopic Examination For Prostate Needle - 01/03/2020, 11/21/2018     GU PMH: Bladder tumor/neoplasm, He has a small lesion in the area of the right UO that is worrisome for a bladder neoplasm. I will schedule cystoscopy at f/u. - 01/03/2020 Renal cyst, There is a RUP complex cyst that will need further imaging with either CT or MRI. I will make that determination once the prostate biopsy results are back. - 01/03/2020 Prostate Cancer, His PSA is back up a bit but below his prior biopsy level and the right apical nodule feels smaller. The MRIP showed 3 small PIRADS 3 lesions. He is due for surveillance biopsy so I will get it set up as an MR Fusion biopsy. Levaquin and Vailum sent. Risks reviewed. - 10/25/2019, He is doing well with a stable nodule and a slight decrease in the PSA. He has mild/moderate LUTS. He would like to  continue surveillance. I will have him return in 6 months with a PSA and MRIP. , - 04/19/2019, 79 yo with T2a Nx Mx Gleason 7(3+4) low volume favorable intermediate risk prostate cancer with mild/mod LUTS and minimal ED in otherwise good health. I discussed the options with him including AS, RALP and EXRT. His prostate is too large for seeds, cryo or HIFU. I reviewed the [facility] and [employer] nomogram data with him and feel his best options are either Active Surveillance or EXRT without ADT. He is most interested in surveillance at this time which I think is very appropriate. He will return in 3 months with a PSA. . , - 11/30/2018 Prostate nodule w/ LUTS (Stable) - 04/19/2019 Elevated PSA - 11/21/2018, He has an elevated PSA with a  prostate nodule. I am going to have him return for a prostate [place] and biopsy. I reviewed the risks of bleeding, infection or voiding difficulty. Levaquin sent. I will request his records from [facility]. , - 09/01/2018 Prostate nodule w/o LUTS - 09/01/2018    NON-GU PMH: GERD Hypertension Skin Cancer, History    FAMILY HISTORY: Colon Cancer - Mother Kidney Stones - Sister Prostate Cancer - Brother   SOCIAL HISTORY: Marital Status: Single Preferred Language: English; Race: White Current Smoking Status: Patient does not smoke anymore.   Tobacco Use Assessment Completed: Used Tobacco in last 30 days? Drinks 4+ caffeinated drinks per day.     Notes: 2 sons, 1 daughter    REVIEW OF SYSTEMS:    GU Review Male:   Patient reports get up at night to urinate, leakage of urine, and stream starts and stops. Patient denies frequent urination, hard to postpone urination, burning/ pain with urination, trouble starting your stream, have to strain to urinate , erection problems, and penile pain.  Gastrointestinal (Upper):   Patient denies nausea, vomiting, and indigestion/ heartburn.  Gastrointestinal (Lower):   Patient denies diarrhea and constipation.  Constitutional:   Patient denies fever, night sweats, weight loss, and fatigue.  Skin:   Patient denies itching and skin rash/ lesion.  Eyes:   Patient denies blurred vision and double vision.  Ears/ Nose/ Throat:   Patient denies sore throat and sinus problems.  Hematologic/Lymphatic:   Patient denies swollen glands and easy bruising.  Cardiovascular:   Patient denies leg swelling and chest pains.  Respiratory:   Patient denies cough and shortness of breath.  Endocrine:   Patient denies excessive thirst.  Musculoskeletal:   Patient denies back pain and joint pain.  Neurological:   Patient denies headaches and dizziness.  Psychologic:   Patient denies depression and anxiety.   VITAL SIGNS:      01/17/2020 01:28 PM  Weight 130 lb / 58.97  kg  Height 67 in / 170.18 cm  BP 193/81 mmHg  Pulse 68 /min  Temperature 98.4 F / 36.8 C  BMI 20.4 kg/m   MULTI-SYSTEM PHYSICAL EXAMINATION:    Constitutional: Well-nourished. No physical deformities. Normally developed. Good grooming.  Respiratory: Normal breath sounds. No labored breathing, no use of accessory muscles.   Cardiovascular: Regular rate and rhythm. No murmur, no gallop.     PAST DATA REVIEWED:  Source Of History:  Patient  Records Review:   Pathology Reports  Urine Test Review:   Urinalysis   10/20/19 04/13/19  PSA  Total PSA 7.70 ng/mL 7.24 ng/mL    PROCEDURES:         Flexible Cystoscopy - 52000  Risks, benefits, and some of the potential  complications of the procedure were discussed. 17ml of 2% lidocaine jelly was instilled intraurethrally.  Cipro 500mg  given for antibiotic prophylaxis.     Meatus:  Normal size. Normal location. Normal condition.  Urethra:  No strictures.  External Sphincter:  Normal.  Verumontanum:  Normal.  Prostate:  Obstructing. Enlarged median lobe. Severe hyperplasia.  Bladder Neck:  Non-obstructing.  Ureteral Orifices:  Normal location. Normal size. Normal shape. Effluxed clear urine.  Bladder:  Mild trabeculation. A trigone tumor. 1 cm tumor beneath the middle lobe. Normal mucosa. No stones.      The procedure was well tolerated and there were no complications.         Urinalysis w/Scope Dipstick Dipstick Cont'd Micro  Color: Yellow Bilirubin: Neg mg/dL WBC/hpf: NS (Not Seen)  Appearance: Clear Ketones: Neg mg/dL RBC/hpf: >60/hpf  Specific Gravity: 1.015 Blood: 3+ ery/uL Bacteria: NS (Not Seen)  pH: 7.5 Protein: Trace mg/dL Cystals: NS (Not Seen)  Glucose: Neg mg/dL Urobilinogen: 0.2 mg/dL Casts: NS (Not Seen)    Nitrites: Neg Trichomonas: Not Present    Leukocyte Esterase: Neg leu/uL Mucous: Not Present      Epithelial Cells: 0 - 5/hpf      Yeast: NS (Not Seen)      Sperm: Not Present    ASSESSMENT:      ICD-10  Details  1 GU:   Prostate Cancer - C61 Chronic, Stable - He has stable prostate cancer and will need a PSA in 6 months.   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 I will set him up for a TURBT and instillation of gemcitabine. I have reviewed the risks of the procedure in detail.    PLAN:           Schedule Return Visit/Planned Activity: Next Available Appointment - Schedule Surgery          Document

## 2020-02-01 ENCOUNTER — Ambulatory Visit (HOSPITAL_BASED_OUTPATIENT_CLINIC_OR_DEPARTMENT_OTHER): Payer: Medicare HMO | Admitting: Anesthesiology

## 2020-02-01 ENCOUNTER — Other Ambulatory Visit: Payer: Self-pay

## 2020-02-01 ENCOUNTER — Encounter (HOSPITAL_BASED_OUTPATIENT_CLINIC_OR_DEPARTMENT_OTHER): Payer: Self-pay | Admitting: Urology

## 2020-02-01 ENCOUNTER — Ambulatory Visit (HOSPITAL_BASED_OUTPATIENT_CLINIC_OR_DEPARTMENT_OTHER)
Admission: RE | Admit: 2020-02-01 | Discharge: 2020-02-01 | Disposition: A | Discharge status: Home / Self Care | Payer: Medicare HMO | Source: Ambulatory Visit | Attending: Urology | Admitting: Urology

## 2020-02-01 ENCOUNTER — Encounter (HOSPITAL_BASED_OUTPATIENT_CLINIC_OR_DEPARTMENT_OTHER)
Admission: RE | Disposition: A | Discharge status: Home / Self Care | Payer: Self-pay | Source: Ambulatory Visit | Attending: Urology

## 2020-02-01 DIAGNOSIS — C61 Malignant neoplasm of prostate: Secondary | ICD-10-CM | POA: Insufficient documentation

## 2020-02-01 DIAGNOSIS — C679 Malignant neoplasm of bladder, unspecified: Secondary | ICD-10-CM | POA: Diagnosis not present

## 2020-02-01 DIAGNOSIS — Z79899 Other long term (current) drug therapy: Secondary | ICD-10-CM | POA: Insufficient documentation

## 2020-02-01 DIAGNOSIS — M1990 Unspecified osteoarthritis, unspecified site: Secondary | ICD-10-CM | POA: Diagnosis not present

## 2020-02-01 DIAGNOSIS — C670 Malignant neoplasm of trigone of bladder: Secondary | ICD-10-CM | POA: Diagnosis not present

## 2020-02-01 DIAGNOSIS — K219 Gastro-esophageal reflux disease without esophagitis: Secondary | ICD-10-CM | POA: Insufficient documentation

## 2020-02-01 DIAGNOSIS — Z87891 Personal history of nicotine dependence: Secondary | ICD-10-CM | POA: Insufficient documentation

## 2020-02-01 DIAGNOSIS — D090 Carcinoma in situ of bladder: Secondary | ICD-10-CM | POA: Diagnosis not present

## 2020-02-01 DIAGNOSIS — D494 Neoplasm of unspecified behavior of bladder: Secondary | ICD-10-CM | POA: Diagnosis not present

## 2020-02-01 DIAGNOSIS — I10 Essential (primary) hypertension: Secondary | ICD-10-CM | POA: Insufficient documentation

## 2020-02-01 HISTORY — DX: Prediabetes: R73.03

## 2020-02-01 HISTORY — DX: Neoplasm of unspecified behavior of bladder: D49.4

## 2020-02-01 HISTORY — DX: Unspecified hearing loss, left ear: H91.92

## 2020-02-01 HISTORY — DX: Benign prostatic hyperplasia with lower urinary tract symptoms: N40.1

## 2020-02-01 HISTORY — DX: Complete loss of teeth, unspecified cause, unspecified class: K08.109

## 2020-02-01 HISTORY — PX: TRANSURETHRAL RESECTION OF BLADDER TUMOR WITH MITOMYCIN-C: SHX6459

## 2020-02-01 HISTORY — DX: Presence of spectacles and contact lenses: Z97.3

## 2020-02-01 HISTORY — DX: Personal history of other infectious and parasitic diseases: Z86.19

## 2020-02-01 HISTORY — DX: Unspecified osteoarthritis, unspecified site: M19.90

## 2020-02-01 HISTORY — DX: Presence of external hearing-aid: Z97.4

## 2020-02-01 LAB — POCT I-STAT, CHEM 8
BUN: 12 mg/dL (ref 8–23)
Calcium, Ion: 1.19 mmol/L (ref 1.15–1.40)
Chloride: 100 mmol/L (ref 98–111)
Creatinine, Ser: 0.7 mg/dL (ref 0.61–1.24)
Glucose, Bld: 105 mg/dL — ABNORMAL HIGH (ref 70–99)
HCT: 46 % (ref 39.0–52.0)
Hemoglobin: 15.6 g/dL (ref 13.0–17.0)
Potassium: 3.9 mmol/L (ref 3.5–5.1)
Sodium: 139 mmol/L (ref 135–145)
TCO2: 28 mmol/L (ref 22–32)

## 2020-02-01 SURGERY — TRANSURETHRAL RESECTION OF BLADDER TUMOR WITH MITOMYCIN-C
Anesthesia: General | Site: Bladder

## 2020-02-01 MED ORDER — CEFAZOLIN SODIUM-DEXTROSE 2-4 GM/100ML-% IV SOLN
INTRAVENOUS | Status: AC
  Filled 2020-02-01: qty 100

## 2020-02-01 MED ORDER — ONDANSETRON HCL 4 MG/2ML IJ SOLN
INTRAMUSCULAR | Status: DC | PRN
  Administered 2020-02-01: 4 mg via INTRAVENOUS

## 2020-02-01 MED ORDER — PROPOFOL 10 MG/ML IV BOLUS
INTRAVENOUS | Status: AC
  Filled 2020-02-01: qty 40

## 2020-02-01 MED ORDER — ACETAMINOPHEN 650 MG RE SUPP
650.0000 mg | RECTAL | Status: DC | PRN
  Filled 2020-02-01: qty 1

## 2020-02-01 MED ORDER — LACTATED RINGERS IV SOLN
INTRAVENOUS | Status: DC
  Filled 2020-02-01: qty 1000

## 2020-02-01 MED ORDER — CEFAZOLIN SODIUM-DEXTROSE 2-4 GM/100ML-% IV SOLN
2.0000 g | INTRAVENOUS | Status: AC
  Administered 2020-02-01: 2 g via INTRAVENOUS
  Filled 2020-02-01: qty 100

## 2020-02-01 MED ORDER — HYDROCODONE-ACETAMINOPHEN 5-325 MG PO TABS: 1.0000 | ORAL_TABLET | Freq: Four times a day (QID) | ORAL | 0 refills | Status: DC | PRN

## 2020-02-01 MED ORDER — FENTANYL CITRATE (PF) 100 MCG/2ML IJ SOLN
INTRAMUSCULAR | Status: DC | PRN
  Administered 2020-02-01: 50 ug via INTRAVENOUS

## 2020-02-01 MED ORDER — SODIUM CHLORIDE 0.9% FLUSH
3.0000 mL | Freq: Two times a day (BID) | INTRAVENOUS | Status: DC
  Filled 2020-02-01: qty 3

## 2020-02-01 MED ORDER — ACETAMINOPHEN 325 MG PO TABS
650.0000 mg | ORAL_TABLET | ORAL | Status: DC | PRN
  Filled 2020-02-01: qty 2

## 2020-02-01 MED ORDER — SODIUM CHLORIDE 0.9% FLUSH
3.0000 mL | INTRAVENOUS | Status: DC | PRN
  Filled 2020-02-01: qty 3

## 2020-02-01 MED ORDER — PROMETHAZINE HCL 25 MG/ML IJ SOLN
6.2500 mg | INTRAMUSCULAR | Status: DC | PRN
  Filled 2020-02-01: qty 1

## 2020-02-01 MED ORDER — FENTANYL CITRATE (PF) 100 MCG/2ML IJ SOLN
INTRAMUSCULAR | Status: AC
  Filled 2020-02-01: qty 2

## 2020-02-01 MED ORDER — CELECOXIB 200 MG PO CAPS
ORAL_CAPSULE | ORAL | Status: AC
  Filled 2020-02-01: qty 1

## 2020-02-01 MED ORDER — LIDOCAINE 2% (20 MG/ML) 5 ML SYRINGE
INTRAMUSCULAR | Status: DC | PRN
  Administered 2020-02-01: 80 mg via INTRAVENOUS

## 2020-02-01 MED ORDER — EPHEDRINE 5 MG/ML INJ
INTRAVENOUS | Status: AC
  Filled 2020-02-01: qty 10

## 2020-02-01 MED ORDER — FENTANYL CITRATE (PF) 100 MCG/2ML IJ SOLN
25.0000 ug | INTRAMUSCULAR | Status: DC | PRN
  Filled 2020-02-01: qty 1

## 2020-02-01 MED ORDER — MORPHINE SULFATE (PF) 2 MG/ML IV SOLN
1.0000 mg | INTRAVENOUS | Status: DC | PRN
  Filled 2020-02-01: qty 0.5

## 2020-02-01 MED ORDER — CELECOXIB 200 MG PO CAPS
200.0000 mg | ORAL_CAPSULE | Freq: Once | ORAL | Status: AC
  Administered 2020-02-01: 200 mg via ORAL
  Filled 2020-02-01: qty 1

## 2020-02-01 MED ORDER — SODIUM CHLORIDE 0.9 % IR SOLN
Status: DC | PRN
  Administered 2020-02-01 (×2): 3000 mL

## 2020-02-01 MED ORDER — PROPOFOL 10 MG/ML IV BOLUS
INTRAVENOUS | Status: DC | PRN
  Administered 2020-02-01: 150 mg via INTRAVENOUS

## 2020-02-01 MED ORDER — OXYCODONE HCL 5 MG PO TABS
5.0000 mg | ORAL_TABLET | ORAL | Status: DC | PRN
  Filled 2020-02-01: qty 2

## 2020-02-01 MED ORDER — ACETAMINOPHEN 500 MG PO TABS
ORAL_TABLET | ORAL | Status: AC
  Filled 2020-02-01: qty 2

## 2020-02-01 MED ORDER — SODIUM CHLORIDE 0.9 % IV SOLN
250.0000 mL | INTRAVENOUS | Status: DC | PRN
  Filled 2020-02-01: qty 250

## 2020-02-01 MED ORDER — DEXAMETHASONE SODIUM PHOSPHATE 10 MG/ML IJ SOLN
INTRAMUSCULAR | Status: DC | PRN
  Administered 2020-02-01: 4 mg via INTRAVENOUS

## 2020-02-01 MED ORDER — ACETAMINOPHEN 500 MG PO TABS
1000.0000 mg | ORAL_TABLET | Freq: Once | ORAL | Status: AC
  Administered 2020-02-01: 1000 mg via ORAL
  Filled 2020-02-01: qty 2

## 2020-02-01 MED ORDER — EPHEDRINE SULFATE-NACL 50-0.9 MG/10ML-% IV SOSY
PREFILLED_SYRINGE | INTRAVENOUS | Status: DC | PRN
  Administered 2020-02-01: 15 mg via INTRAVENOUS

## 2020-02-01 MED ORDER — GEMCITABINE CHEMO FOR BLADDER INSTILLATION 2000 MG
2000.0000 mg | Freq: Once | INTRAVENOUS | Status: DC
  Filled 2020-02-01: qty 52.6

## 2020-02-01 MED ORDER — MIDAZOLAM HCL 2 MG/2ML IJ SOLN
INTRAMUSCULAR | Status: AC
  Filled 2020-02-01: qty 2

## 2020-02-01 SURGICAL SUPPLY — 26 items
BAG DRAIN URO-CYSTO SKYTR STRL (DRAIN) ×2 IMPLANT
BAG URINE DRAIN 2000ML AR STRL (UROLOGICAL SUPPLIES) ×1 IMPLANT
BAG URINE LEG 500ML (DRAIN) IMPLANT
CATH FOLEY 2WAY SLVR  5CC 20FR (CATHETERS) ×1
CATH FOLEY 2WAY SLVR  5CC 22FR (CATHETERS)
CATH FOLEY 2WAY SLVR 5CC 20FR (CATHETERS) IMPLANT
CATH FOLEY 2WAY SLVR 5CC 22FR (CATHETERS) IMPLANT
CLOTH BEACON ORANGE TIMEOUT ST (SAFETY) ×2 IMPLANT
ELECT REM PT RETURN 9FT ADLT (ELECTROSURGICAL)
ELECTRODE REM PT RTRN 9FT ADLT (ELECTROSURGICAL) ×1 IMPLANT
GLOVE INDICATOR 6.5 STRL GRN (GLOVE) ×2 IMPLANT
GLOVE SURG SS PI 8.0 STRL IVOR (GLOVE) ×2 IMPLANT
GOWN SRG LRG 43XLVL 3 (GOWN DISPOSABLE) IMPLANT
GOWN STRL NON-REIN LRG LVL3 (GOWN DISPOSABLE) ×1
GOWN STRL REUS W/TWL XL LVL3 (GOWN DISPOSABLE) ×2 IMPLANT
HOLDER FOLEY CATH W/STRAP (MISCELLANEOUS) ×1 IMPLANT
IV NS IRRIG 3000ML ARTHROMATIC (IV SOLUTION) ×2 IMPLANT
KIT TURNOVER CYSTO (KITS) ×2 IMPLANT
LOOP CUT BIPOLAR 24F LRG (ELECTROSURGICAL) ×1 IMPLANT
MANIFOLD NEPTUNE II (INSTRUMENTS) ×2 IMPLANT
PACK CYSTO (CUSTOM PROCEDURE TRAY) ×2 IMPLANT
PLUG CATH AND CAP STER (CATHETERS) IMPLANT
SYR TOOMEY IRRIG 70ML (MISCELLANEOUS)
SYRINGE TOOMEY IRRIG 70ML (MISCELLANEOUS) IMPLANT
TUBE CONNECTING 12X1/4 (SUCTIONS) ×2 IMPLANT
TUBING UROLOGY SET (TUBING) ×2 IMPLANT

## 2020-02-01 NOTE — Interval H&P Note (Signed)
History and Physical Interval Note:  02/01/2020 10:57 AM  Noah Burnett  has presented today for surgery, with the diagnosis of BLADDER TUMOR.  The various methods of treatment have been discussed with the patient and family. After consideration of risks, benefits and other options for treatment, the patient has consented to  Procedure(s): TRANSURETHRAL RESECTION OF BLADDER TUMOR WITH GENCITABINE (N/A) as a surgical intervention.  The patient's history has been reviewed, patient examined, no change in status, stable for surgery.  I have reviewed the patient's chart and labs.  Questions were answered to the patient's satisfaction.     [name]

## 2020-02-01 NOTE — Discharge Instructions (Addendum)
Transurethral Resection of Bladder Tumor, Care After This sheet gives you information about how to care for yourself after your procedure. Your health care provider may also give you more specific instructions. If you have problems or questions, contact your health care provider. What can I expect after the procedure? After the procedure, it is common to have:  A small amount of blood in your urine for up to 2 weeks.  Soreness or mild pain from your catheter. After your catheter is removed, you may have mild soreness, especially when urinating.  Pain in your lower abdomen. Follow these instructions at home: Medicines   Take over-the-counter and prescription medicines only as told by your health care provider.  If you were prescribed an antibiotic medicine, take it as told by your health care provider. Do not stop taking the antibiotic even if you start to feel better.  Do not drive for 24 hours if you were given a sedative during your procedure.  Ask your health care provider if the medicine prescribed to you: ? Requires you to avoid driving or using heavy machinery. ? Can cause constipation. You may need to take these actions to prevent or treat constipation:  Take over-the-counter or prescription medicines.  Eat foods that are high in fiber, such as beans, whole grains, and fresh fruits and vegetables.  Limit foods that are high in fat and processed sugars, such as fried or sweet foods. Activity  Return to your normal activities as told by your health care provider. Ask your health care provider what activities are safe for you.  Do not lift anything that is heavier than 10 lb (4.5 kg), or the limit that you are told, until your health care provider says that it is safe.  Avoid intense physical activity for as long as told by your health care provider.  Rest as told by your health care provider.  Avoid sitting for a long time without moving. Get up to take short walks every  1-2 hours. This is important to improve blood flow and breathing. Ask for help if you feel weak or unsteady. General instructions   Do not drink alcohol for as long as told by your health care provider. This is especially important if you are taking prescription pain medicines.  Do not take baths, swim, or use a hot tub until your health care provider approves. Ask your health care provider if you may take showers. You may only be allowed to take sponge baths.  If you have a catheter, follow instructions from your health care provider about caring for your catheter and your drainage bag.  Drink enough fluid to keep your urine pale yellow.  Wear compression stockings as told by your health care provider. These stockings help to prevent blood clots and reduce swelling in your legs.  Keep all follow-up visits as told by your health care provider. This is important. ? You will need to be followed closely with regular checks of your bladder and urethra (cystoscopies) to make sure that the cancer does not come back. Contact a health care provider if:  You have pain that gets worse or does not improve with medicine.  You have blood in your urine for more than 2 weeks.  You have cloudy or bad-smelling urine.  You become constipated. Signs of constipation may include having: ? Fewer than three bowel movements in a week. ? Difficulty having a bowel movement. ? Stools that are dry, hard, or larger than normal.  You have  a fever. Get help right away if:  You have: ? Severe pain. ? Bright red blood in your urine. ? Blood clots in your urine. ? A lot of blood in your urine.  Your catheter has been removed and you are not able to urinate.  You have a catheter in place and the catheter is not draining urine. Summary  After your procedure, it is common to have a small amount of blood in your urine, soreness or mild pain from your catheter, and pain in your lower abdomen.  Take  over-the-counter and prescription medicines only as told by your health care provider.  Rest as told by your health care provider. Follow your health care provider's instructions about returning to normal activities. Ask what activities are safe for you.  If you have a catheter, follow instructions from your health care provider about caring for your catheter and your drainage bag.  Get help right away if you cannot urinate, you have severe pain, or you have bright red blood or blood clots in your urine.  You may remove the catheter in the morning and if you don't feel comfortable doing that, please call the office to have it removed.   This information is not intended to replace advice given to you by your health care provider. Make sure you discuss any questions you have with your health care provider. Document Revised: 05/26/2018 Document Reviewed: 05/26/2018 Elsevier Patient Education  [place] Instructions  Activity: Get plenty of rest for the remainder of the day. A responsible individual must stay with you for 24 hours following the procedure.  For the next 24 hours, DO NOT: -Drive a car -Paediatric nurse -Drink alcoholic beverages -Take any medication unless instructed by your physician -Make any legal decisions or sign important papers.  Meals: Start with liquid foods such as gelatin or soup. Progress to regular foods as tolerated. Avoid greasy, spicy, heavy foods. If nausea and/or vomiting occur, drink only clear liquids until the nausea and/or vomiting subsides. Call your physician if vomiting continues.  Special Instructions/Symptoms: Your throat may feel dry or sore from the anesthesia or the breathing tube placed in your throat during surgery. If this causes discomfort, gargle with warm salt water. The discomfort should disappear within 24 hours.  If you had a scopolamine patch placed behind your ear for the management of post-  operative nausea and/or vomiting:  1. The medication in the patch is effective for 72 hours, after which it should be removed.  Wrap patch in a tissue and discard in the trash. Wash hands thoroughly with soap and water. 2. You may remove the patch earlier than 72 hours if you experience unpleasant side effects which may include dry mouth, dizziness or visual disturbances. 3. Avoid touching the patch. Wash your hands with soap and water after contact with the patch.

## 2020-02-01 NOTE — Transfer of Care (Signed)
    Last Vitals:  Vitals Value Taken Time  BP 135/65 02/01/20 1149  Temp    Pulse 54 02/01/20 1153  Resp 17 02/01/20 1153  SpO2 100 % 02/01/20 1153  Vitals shown include unvalidated device data.  Last Pain:  Vitals:   02/01/20 0915  TempSrc: Oral        Immediate Anesthesia Transfer of Care Note  Patient: Noah Burnett  Procedure(s) Performed: Procedure(s) (LRB): TRANSURETHRAL RESECTION OF BLADDER TUMOR (N/A)  Patient Location: PACU  Anesthesia Type: General  Level of Consciousness: awake, alert  and oriented  Airway & Oxygen Therapy: Patient Spontanous Breathing and Patient connected to nasal cannula oxygen  Post-op Assessment: Report given to PACU RN and Post -op Vital signs reviewed and stable  Post vital signs: Reviewed and stable  Complications: No apparent anesthesia complications

## 2020-02-01 NOTE — Anesthesia Preprocedure Evaluation (Addendum)
Anesthesia Evaluation  Patient identified by MRN, date of birth, ID band Patient awake    Reviewed: Allergy & Precautions, NPO status , Patient's Chart, lab work & pertinent test results  Airway Mallampati: I  TM Distance: >3 FB Neck ROM: Full    Dental  (+) Upper Dentures, Lower Dentures, Dental Advisory Given   Pulmonary neg pulmonary ROS, former smoker,    Pulmonary exam normal        Cardiovascular hypertension, Pt. on medications Normal cardiovascular exam     Neuro/Psych negative neurological ROS     GI/Hepatic Neg liver ROS, GERD  Medicated,  Endo/Other  negative endocrine ROS  Renal/GU negative Renal ROS     Musculoskeletal negative musculoskeletal ROS (+)   Abdominal   Peds  Hematology negative hematology ROS (+)   Anesthesia Other Findings Day of surgery medications reviewed with the patient.  Reproductive/Obstetrics                            Anesthesia Physical Anesthesia Plan  ASA: II  Anesthesia Plan: General   Post-op Pain Management:    Induction: Intravenous  PONV Risk Score and Plan: 3 and Ondansetron, Dexamethasone and Diphenhydramine  Airway Management Planned: LMA  Additional Equipment:   Intra-op Plan:   Post-operative Plan: Extubation in OR  Informed Consent: I have reviewed the patients History and Physical, chart, labs and discussed the procedure including the risks, benefits and alternatives for the proposed anesthesia with the patient or authorized representative who has indicated his/her understanding and acceptance.     Dental advisory given  Plan Discussed with: Anesthesiologist and CRNA  Anesthesia Plan Comments:        Anesthesia Quick Evaluation

## 2020-02-01 NOTE — Anesthesia Procedure Notes (Signed)
Procedure Name: LMA Insertion Date/Time: 02/01/2020 11:11 AM Performed by: Mechele Claude, CRNA Pre-anesthesia Checklist: Patient identified, Emergency Drugs available, Suction available and Patient being monitored Patient Re-evaluated:Patient Re-evaluated prior to induction Oxygen Delivery Method: Circle system utilized Preoxygenation: Pre-oxygenation with 100% oxygen Induction Type: IV induction Ventilation: Mask ventilation without difficulty LMA: LMA inserted LMA Size: 4.0 Number of attempts: 1 Airway Equipment and Method: Bite block Placement Confirmation: positive ETCO2 Tube secured with: Tape Dental Injury: Teeth and Oropharynx as per pre-operative assessment

## 2020-02-01 NOTE — Op Note (Signed)
Procedure: 1.  Cystoscopy with transurethral resection of a 3 cm right trigone bladder tumor. 2.  Fulguration of bladder neck.  Preop diagnosis: Right trigone tumor.  Postop diagnosis: Same with involvement of the right ureteral orifice requiring resection and a vascular prostate middle lobe requiring fulguration.  Surgeon: Dr. [name].  Anesthesia: General.  Specimen: Bladder tumor chips.  Drains: 26 French Foley catheter.  EBL: 2 mL.  Complications: None.  Indications: Noah Burnett is a 79 year old male with a history of prostate cancer on surveillance who was found to have microhematuria on a recent urinalysis.  CT scanning demonstrated a possible tumor in the trigone and that was confirmed at cystoscopy with the tumor located on the trigone behind the large middle lobe of the prostate.  Procedure: Noah Burnett was taken operating room where general anesthetic was induced.  Noah Burnett was given 2 g of Ancef.  Noah Burnett was placed in lithotomy position and fitted with PAS hose.  Noah Burnett perineum and genitalia were prepped with Betadine solution Noah Burnett was draped in usual sterile fashion.  Cystoscopy was performed using the 63 [place] scope with a 30 and 70 degree lenses.  Examination revealed a normal urethra.  The external sphincter was intact.  The prostatic urethra was approximately 4 to 5 cm in length with trilobar hyperplasia with a large middle lobe with obstruction.  Examination of bladder demonstrated moderate to severe trabeculation with several cellules and small diverticuli.  Initially the tumor was not seen but with the aid of the 70 degree lens I was able to find it near the right trigone behind the middle lobe and then reassess it using the 30 degree lens.  The right ureteral orifice was not clearly seen and it was felt to be within the tumor mass.  Noah Burnett was also noted to have several peripheral papillary fronds with a total diameter of the tumor area of approximately 3 cm.  The left trigone and ureter were  unremarkable and no other mucosal lesions were identified in the bladder.  The urethra was then calibrated to 30 [place] with [employer] and a 26 French continuous-flow resectoscope sheath was inserted.  This was fitted with a Beatrix Fetters handle, a 30 degree lens and a bipolar loop.  Saline was used as the irrigant.  The tumor was then resected and to complete the resection, the right ureteral orifice was resected.  Some residual tumor on the periphery was fulgurated to complete the tumor destruction in the tumor bed was fulgurated with care being taken to avoid excessive cautery around the ureteral orifice.  Inspection of the ureteral orifice at the end of the procedure demonstrated what appeared to be a clean resection and I did not feel that the stent was indicated, particularly since removal of the stent in the office would be problematic due to the patient's very large middle lobe.  The bladder was evacuated free of tumor chips and final hemostasis was achieved in the tumor bed.  It did appear that muscle was resected but no bladder wall perforation was noted.  Because of the manipulation required to get to the tumor there was some bleeding from the very vascular middle lobe and that required fulguration of the anterior mucosa of the middle lobe as well as some additional fulguration of the proximal posterior lateral lobes.  Once hemostasis was achieved the resectoscope was removed and a 22 French Foley catheter was inserted.  The balloon was filled with 10 mL of sterile fluid and the catheter was placed  to straight drainage with clear return.  Noah Burnett was then taken down from lithotomy position, Noah Burnett anesthetic was reversed and Noah Burnett was moved recovery room in stable condition.  There were no complications.  Because of the need to resect the right ureteral orifice I felt that gemcitabine was not appropriate as it can complicate the healing process and potentially lead to a higher risk of ureteral stricture.

## 2020-02-01 NOTE — Anesthesia Postprocedure Evaluation (Signed)
Anesthesia Post Note  Patient: Noah Burnett  Procedure(s) Performed: TRANSURETHRAL RESECTION OF BLADDER TUMOR (N/A Bladder)     Patient location during evaluation: PACU Anesthesia Type: General Level of consciousness: sedated Pain management: pain level controlled Vital Signs Assessment: post-procedure vital signs reviewed and stable Respiratory status: spontaneous breathing and respiratory function stable Cardiovascular status: stable Postop Assessment: no apparent nausea or vomiting Anesthetic complications: no    Last Vitals:  Vitals:   02/01/20 1200 02/01/20 1215  BP: (!) 143/70 (!) 148/73  Pulse: (!) 57 (!) 51  Resp: 16 11  Temp:    SpO2: 99% 99%    Last Pain:  Vitals:   02/01/20 0915  TempSrc: Oral                 [name] DANIEL

## 2020-02-02 LAB — SURGICAL PATHOLOGY

## 2020-02-05 ENCOUNTER — Encounter (HOSPITAL_COMMUNITY): Payer: Self-pay | Admitting: Emergency Medicine

## 2020-02-05 ENCOUNTER — Emergency Department (HOSPITAL_COMMUNITY)
Admission: EM | Admit: 2020-02-05 | Discharge: 2020-02-05 | Disposition: A | Discharge status: Home / Self Care | Payer: Medicare HMO | Attending: Emergency Medicine | Admitting: Emergency Medicine

## 2020-02-05 ENCOUNTER — Other Ambulatory Visit: Payer: Self-pay

## 2020-02-05 DIAGNOSIS — I10 Essential (primary) hypertension: Secondary | ICD-10-CM | POA: Insufficient documentation

## 2020-02-05 DIAGNOSIS — Z79899 Other long term (current) drug therapy: Secondary | ICD-10-CM | POA: Diagnosis not present

## 2020-02-05 DIAGNOSIS — R339 Retention of urine, unspecified: Secondary | ICD-10-CM | POA: Diagnosis not present

## 2020-02-05 DIAGNOSIS — Z87891 Personal history of nicotine dependence: Secondary | ICD-10-CM | POA: Diagnosis not present

## 2020-02-05 LAB — URINALYSIS, ROUTINE W REFLEX MICROSCOPIC
Bacteria, UA: NONE SEEN
Bilirubin Urine: NEGATIVE
Glucose, UA: 50 mg/dL — AB
Ketones, ur: NEGATIVE mg/dL
Leukocytes,Ua: NEGATIVE
Nitrite: POSITIVE — AB
Protein, ur: 100 mg/dL — AB
RBC / HPF: >50 RBC/hpf — ABNORMAL HIGH (ref 0–5)
Specific Gravity, Urine: 1.008 (ref 1.005–1.030)
pH: 8 (ref 5.0–8.0)

## 2020-02-05 MED ORDER — CEPHALEXIN 500 MG PO CAPS: 500.0000 mg | ORAL_CAPSULE | Freq: Two times a day (BID) | ORAL | 0 refills | Status: DC

## 2020-02-05 MED ORDER — ACETAMINOPHEN 500 MG PO TABS
1000.0000 mg | ORAL_TABLET | Freq: Once | ORAL | Status: AC
  Administered 2020-02-05: 1000 mg via ORAL
  Filled 2020-02-05: qty 2

## 2020-02-05 NOTE — Discharge Instructions (Addendum)
Maintain catheter until you see your specialist early this week call for an appointment. Use Tylenol every 4-6 hours as needed for discomfort. Follow up culture result as you may not need antibiotics.  Return to see clinician if you develop uncontrolled pain, persistent vomiting, urine stops flowing out into the bag or new concerns.

## 2020-02-05 NOTE — ED Triage Notes (Signed)
Pt here with urinary retention x 3-4 hrs and bladder pain since TURP on 02/01/20.

## 2020-02-05 NOTE — ED Provider Notes (Signed)
[facility] EMERGENCY DEPARTMENT Provider Note   CSN: [id number] Arrival date & time: 02/05/20  0134     History Chief Complaint  Patient presents with  . Urinary Retention    Noah Burnett is a 79 y.o. male.  Patient with history of bladder tumor, high blood pressure, prostate enlargement, recent prostate procedure on Thursday presents with urinary retention and abdominal pain.  Patient is not urinated since 8:00 yesterday evening.  Patient had a catheter however removed it on Friday and was doing well until this evening.  No fevers chills or vomiting.  Gradually worsening abdominal/pelvic discomfort.  Patient has urologist to follow-up with.        Past Medical History:  Diagnosis Date  . Arthritis   . Bladder tumor   . Deafness, left   . Full dentures   . GERD (gastroesophageal reflux disease)   . History of [place] spotted fever    01-25-2020  per pt had in 2018 and was treated w/ no residual  . Hyperplasia of prostate with lower urinary tract symptoms (LUTS)   . Hypertension    followed by pcp   (01-25-2020  per pt had stress test approx. 2005, told normal , done in [place] somewhere)  . Pre-diabetes    followed by pcp ,   watches diet  . Prostate cancer [facility]) urologist--- dr Jeffie Pollock   dx by bx01-13-2020,  Gleason 3+4 ;  MRI 12-19-2018, vol 96.26;   active survillance  . Wears glasses   . Wears hearing aid in right ear     There are no problems to display for this patient.   Past Surgical History:  Procedure Laterality Date  . SHOULDER ARTHROSCOPY WITH ROTATOR CUFF REPAIR AND SUBACROMIAL DECOMPRESSION Right 05-20-2009  @[facility]   . TRANSURETHRAL RESECTION OF BLADDER TUMOR WITH MITOMYCIN-C N/A 02/01/2020   Procedure: TRANSURETHRAL RESECTION OF BLADDER TUMOR;  Surgeon: [name], MD;  Location: [facility];  Service: Urology;  Laterality: N/A;  . TRANSURETHRAL RESECTION OF PROSTATE         History reviewed. No pertinent family  history.  Social History   Tobacco Use  . Smoking status: Former Smoker    Packs/day: 1.00    Years: 36.00    Pack years: 36.00    Types: Cigarettes    Start date: 1964    Quit date: 2000    Years since quitting: 21.2  . Smokeless tobacco: Never Used  Substance Use Topics  . Alcohol use: Not Currently  . Drug use: Never    Home Medications Prior to Admission medications   Medication Sig Start Date End Date Taking? Authorizing Provider  amLODipine (NORVASC) 2.5 MG tablet Take 2.5 mg by mouth every evening.    [provider]  cephALEXin (KEFLEX) 500 MG capsule Take 1 capsule (500 mg total) by mouth 2 (two) times daily. 02/05/20   [name], MD  hydrochlorothiazide (MICROZIDE) 12.5 MG capsule Take 12.5 mg by mouth daily.  02/25/18   [provider]  HYDROcodone-acetaminophen (NORCO) 5-325 MG tablet Take 1 tablet by mouth every 6 (six) hours as needed for moderate pain. 02/01/20   [name], MD  omeprazole (PRILOSEC) 40 MG capsule Take 40 mg by mouth daily.  02/25/18   [provider]    Allergies    Haemophilus influenzae vaccines  Review of Systems   Review of Systems  Constitutional: Negative for chills and fever.  HENT: Negative for congestion.   Respiratory: Negative for shortness of  breath.   Cardiovascular: Negative for chest pain.  Gastrointestinal: Positive for abdominal pain. Negative for vomiting.  Genitourinary: Positive for difficulty urinating. Negative for dysuria and flank pain.  Musculoskeletal: Negative for back pain, neck pain and neck stiffness.  Skin: Negative for rash.  Neurological: Negative for light-headedness and headaches.    Physical Exam Updated Vital Signs BP 132/62   Pulse (!) 56   Temp 97.6 F (36.4 C) (Oral)   Resp 14   Ht 5\' 7"  (1.702 m)   Wt 60.3 kg   SpO2 95%   BMI 20.82 kg/m   Physical Exam Vitals and nursing note reviewed.  Constitutional:      Appearance: He is well-developed.  HENT:      Head: Normocephalic and atraumatic.  Eyes:     General:        Right eye: No discharge.        Left eye: No discharge.     Conjunctiva/sclera: Conjunctivae normal.  Neck:     Trachea: No tracheal deviation.  Cardiovascular:     Rate and Rhythm: Normal rate.  Pulmonary:     Effort: Pulmonary effort is normal.  Abdominal:     General: There is distension.     Palpations: Abdomen is soft.     Tenderness: There is abdominal tenderness (suprapubic). There is no guarding.  Musculoskeletal:     Cervical back: Normal range of motion and neck supple.  Skin:    General: Skin is warm.     Findings: No rash.  Neurological:     Mental Status: He is alert and oriented to person, place, and time.     ED Results / Procedures / Treatments   Labs (all labs ordered are listed, but only abnormal results are displayed) Labs Reviewed  URINALYSIS, ROUTINE W REFLEX MICROSCOPIC - Abnormal; Notable for the following components:      Result Value   Color, Urine AMBER (*)    APPearance CLOUDY (*)    Glucose, UA 50 (*)    Hgb urine dipstick MODERATE (*)    Protein, ur 100 (*)    Nitrite POSITIVE (*)    RBC / HPF >50 (*)    All other components within normal limits  URINE CULTURE    EKG None  Radiology No results found.  Procedures Procedures (including critical care time)  Medications Ordered in ED Medications  acetaminophen (TYLENOL) tablet 1,000 mg (1,000 mg Oral Given 02/05/20 0221)    ED Course  I have reviewed the triage vital signs and the nursing notes.  Pertinent labs & imaging results that were available during my care of the patient were reviewed by me and considered in my medical decision making (see chart for details).    MDM Rules/Calculators/A&P                     Patient presents with acute urinary retention with recent prostate procedure.  Nursing staff performed bladder scan with greater than 500 cc.  Instructed for catheter placement, will check urine and send  culture and discussed follow-up with patient's urologist this week.  Patient has no other symptoms aside from mild elevated blood pressure likely from pain vital signs normal. Urinalysis was reviewed showing hemoglobin and positive nitrite.  Plan to start Keflex tomorrow and follow-up urine culture specialist on Tuesday Wednesday.  Patient well-appearing while in the emergency room.  Final Clinical Impression(s) / ED Diagnoses Final diagnoses:  Urinary retention    Rx / DC  Orders ED Discharge Orders         Ordered    cephALEXin (KEFLEX) 500 MG capsule  2 times daily     02/05/20 [id number]           [name], MD 02/05/20 [phone]

## 2020-02-06 LAB — URINE CULTURE: Culture: NO GROWTH

## 2020-02-08 DIAGNOSIS — C670 Malignant neoplasm of trigone of bladder: Secondary | ICD-10-CM | POA: Diagnosis not present

## 2020-02-08 DIAGNOSIS — N403 Nodular prostate with lower urinary tract symptoms: Secondary | ICD-10-CM | POA: Diagnosis not present

## 2020-02-08 DIAGNOSIS — R338 Other retention of urine: Secondary | ICD-10-CM | POA: Diagnosis not present

## 2020-03-12 DIAGNOSIS — C61 Malignant neoplasm of prostate: Secondary | ICD-10-CM | POA: Diagnosis not present

## 2020-03-12 DIAGNOSIS — R338 Other retention of urine: Secondary | ICD-10-CM | POA: Diagnosis not present

## 2020-03-12 DIAGNOSIS — N281 Cyst of kidney, acquired: Secondary | ICD-10-CM | POA: Diagnosis not present

## 2020-03-12 DIAGNOSIS — C670 Malignant neoplasm of trigone of bladder: Secondary | ICD-10-CM | POA: Diagnosis not present

## 2020-03-15 ENCOUNTER — Other Ambulatory Visit: Payer: Self-pay | Admitting: *Deleted

## 2020-03-15 DIAGNOSIS — N281 Cyst of kidney, acquired: Secondary | ICD-10-CM

## 2020-03-20 DIAGNOSIS — I10 Essential (primary) hypertension: Secondary | ICD-10-CM | POA: Diagnosis not present

## 2020-03-20 DIAGNOSIS — R7301 Impaired fasting glucose: Secondary | ICD-10-CM | POA: Diagnosis not present

## 2020-03-20 DIAGNOSIS — K219 Gastro-esophageal reflux disease without esophagitis: Secondary | ICD-10-CM | POA: Diagnosis not present

## 2020-03-20 DIAGNOSIS — R972 Elevated prostate specific antigen [PSA]: Secondary | ICD-10-CM | POA: Diagnosis not present

## 2020-04-11 ENCOUNTER — Ambulatory Visit
Admission: RE | Admit: 2020-04-11 | Discharge: 2020-04-11 | Disposition: A | Discharge status: Home / Self Care | Payer: Medicare HMO | Source: Ambulatory Visit | Attending: Nurse Practitioner | Admitting: Nurse Practitioner

## 2020-04-11 ENCOUNTER — Other Ambulatory Visit: Payer: Self-pay

## 2020-04-11 DIAGNOSIS — N281 Cyst of kidney, acquired: Secondary | ICD-10-CM | POA: Diagnosis not present

## 2020-04-11 MED ORDER — GADOBENATE DIMEGLUMINE 529 MG/ML IV SOLN
12.0000 mL | Freq: Once | INTRAVENOUS | Status: AC | PRN
  Administered 2020-04-11: 12 mL via INTRAVENOUS

## 2020-05-03 DIAGNOSIS — R7303 Prediabetes: Secondary | ICD-10-CM | POA: Diagnosis not present

## 2020-05-03 DIAGNOSIS — C4492 Squamous cell carcinoma of skin, unspecified: Secondary | ICD-10-CM | POA: Diagnosis not present

## 2020-05-03 DIAGNOSIS — R7301 Impaired fasting glucose: Secondary | ICD-10-CM | POA: Diagnosis not present

## 2020-05-03 DIAGNOSIS — L258 Unspecified contact dermatitis due to other agents: Secondary | ICD-10-CM | POA: Diagnosis not present

## 2020-05-03 DIAGNOSIS — I10 Essential (primary) hypertension: Secondary | ICD-10-CM | POA: Diagnosis not present

## 2020-05-03 DIAGNOSIS — C61 Malignant neoplasm of prostate: Secondary | ICD-10-CM | POA: Diagnosis not present

## 2020-05-03 DIAGNOSIS — S80819A Abrasion, unspecified lower leg, initial encounter: Secondary | ICD-10-CM | POA: Diagnosis not present

## 2020-05-03 DIAGNOSIS — R509 Fever, unspecified: Secondary | ICD-10-CM | POA: Diagnosis not present

## 2020-05-03 DIAGNOSIS — K219 Gastro-esophageal reflux disease without esophagitis: Secondary | ICD-10-CM | POA: Diagnosis not present

## 2020-05-03 DIAGNOSIS — R972 Elevated prostate specific antigen [PSA]: Secondary | ICD-10-CM | POA: Diagnosis not present

## 2020-05-07 DIAGNOSIS — H524 Presbyopia: Secondary | ICD-10-CM | POA: Diagnosis not present

## 2020-05-07 DIAGNOSIS — Z0100 Encounter for examination of eyes and vision without abnormal findings: Secondary | ICD-10-CM | POA: Diagnosis not present

## 2020-05-08 DIAGNOSIS — I10 Essential (primary) hypertension: Secondary | ICD-10-CM | POA: Diagnosis not present

## 2020-05-08 DIAGNOSIS — K219 Gastro-esophageal reflux disease without esophagitis: Secondary | ICD-10-CM | POA: Diagnosis not present

## 2020-05-08 DIAGNOSIS — R7303 Prediabetes: Secondary | ICD-10-CM | POA: Diagnosis not present

## 2020-05-08 DIAGNOSIS — Z0001 Encounter for general adult medical examination with abnormal findings: Secondary | ICD-10-CM | POA: Diagnosis not present

## 2020-05-08 DIAGNOSIS — R972 Elevated prostate specific antigen [PSA]: Secondary | ICD-10-CM | POA: Diagnosis not present

## 2020-05-08 DIAGNOSIS — C61 Malignant neoplasm of prostate: Secondary | ICD-10-CM | POA: Diagnosis not present

## 2020-05-08 DIAGNOSIS — R7301 Impaired fasting glucose: Secondary | ICD-10-CM | POA: Diagnosis not present

## 2020-05-15 DIAGNOSIS — Z8551 Personal history of malignant neoplasm of bladder: Secondary | ICD-10-CM | POA: Diagnosis not present

## 2020-06-19 DIAGNOSIS — M25561 Pain in right knee: Secondary | ICD-10-CM | POA: Diagnosis not present

## 2020-07-04 DIAGNOSIS — Z0000 Encounter for general adult medical examination without abnormal findings: Secondary | ICD-10-CM | POA: Diagnosis not present

## 2020-07-04 DIAGNOSIS — R509 Fever, unspecified: Secondary | ICD-10-CM | POA: Diagnosis not present

## 2020-07-04 DIAGNOSIS — I10 Essential (primary) hypertension: Secondary | ICD-10-CM | POA: Diagnosis not present

## 2020-07-04 DIAGNOSIS — R972 Elevated prostate specific antigen [PSA]: Secondary | ICD-10-CM | POA: Diagnosis not present

## 2020-07-04 DIAGNOSIS — R7301 Impaired fasting glucose: Secondary | ICD-10-CM | POA: Diagnosis not present

## 2020-07-04 DIAGNOSIS — K219 Gastro-esophageal reflux disease without esophagitis: Secondary | ICD-10-CM | POA: Diagnosis not present

## 2020-07-04 DIAGNOSIS — Z6821 Body mass index (BMI) 21.0-21.9, adult: Secondary | ICD-10-CM | POA: Diagnosis not present

## 2020-07-10 DIAGNOSIS — L570 Actinic keratosis: Secondary | ICD-10-CM | POA: Diagnosis not present

## 2020-07-10 DIAGNOSIS — L578 Other skin changes due to chronic exposure to nonionizing radiation: Secondary | ICD-10-CM | POA: Diagnosis not present

## 2020-07-10 DIAGNOSIS — Z85828 Personal history of other malignant neoplasm of skin: Secondary | ICD-10-CM | POA: Diagnosis not present

## 2020-07-10 DIAGNOSIS — D224 Melanocytic nevi of scalp and neck: Secondary | ICD-10-CM | POA: Diagnosis not present

## 2020-07-10 DIAGNOSIS — L720 Epidermal cyst: Secondary | ICD-10-CM | POA: Diagnosis not present

## 2020-07-10 DIAGNOSIS — L821 Other seborrheic keratosis: Secondary | ICD-10-CM | POA: Diagnosis not present

## 2020-07-10 DIAGNOSIS — L814 Other melanin hyperpigmentation: Secondary | ICD-10-CM | POA: Diagnosis not present

## 2020-07-31 DIAGNOSIS — R7301 Impaired fasting glucose: Secondary | ICD-10-CM | POA: Diagnosis not present

## 2020-07-31 DIAGNOSIS — Z0000 Encounter for general adult medical examination without abnormal findings: Secondary | ICD-10-CM | POA: Diagnosis not present

## 2020-07-31 DIAGNOSIS — I10 Essential (primary) hypertension: Secondary | ICD-10-CM | POA: Diagnosis not present

## 2020-07-31 DIAGNOSIS — K219 Gastro-esophageal reflux disease without esophagitis: Secondary | ICD-10-CM | POA: Diagnosis not present

## 2020-07-31 DIAGNOSIS — R972 Elevated prostate specific antigen [PSA]: Secondary | ICD-10-CM | POA: Diagnosis not present

## 2020-07-31 DIAGNOSIS — R509 Fever, unspecified: Secondary | ICD-10-CM | POA: Diagnosis not present

## 2020-07-31 DIAGNOSIS — Z6821 Body mass index (BMI) 21.0-21.9, adult: Secondary | ICD-10-CM | POA: Diagnosis not present

## 2020-08-16 DIAGNOSIS — C61 Malignant neoplasm of prostate: Secondary | ICD-10-CM | POA: Diagnosis not present

## 2020-08-21 DIAGNOSIS — Z8551 Personal history of malignant neoplasm of bladder: Secondary | ICD-10-CM | POA: Diagnosis not present

## 2020-08-21 DIAGNOSIS — C61 Malignant neoplasm of prostate: Secondary | ICD-10-CM | POA: Diagnosis not present

## 2020-08-21 DIAGNOSIS — R972 Elevated prostate specific antigen [PSA]: Secondary | ICD-10-CM | POA: Diagnosis not present

## 2020-09-02 DIAGNOSIS — E785 Hyperlipidemia, unspecified: Secondary | ICD-10-CM | POA: Diagnosis not present

## 2020-09-02 DIAGNOSIS — J449 Chronic obstructive pulmonary disease, unspecified: Secondary | ICD-10-CM | POA: Diagnosis not present

## 2020-09-02 DIAGNOSIS — J019 Acute sinusitis, unspecified: Secondary | ICD-10-CM | POA: Diagnosis not present

## 2020-12-08 IMAGING — MR MR ABDOMEN WO/W CM
16 of 17 series · 45 of 48 positions shown · IV contrast (multihance)
Comparison: [HOSPITAL] renal ultrasound dated 03/12/2020

CLINICAL DATA: Renal cyst.  History of prostate cancer.

EXAM:
MRI ABDOMEN WITHOUT AND WITH CONTRAST
TECHNIQUE: Multiplanar multisequence MR imaging of the abdomen was performed
both before and after the administration of intravenous contrast.
CONTRAST:  12mL MULTIHANCE GADOBENATE DIMEGLUMINE 529 MG/ML IV SOLN

[Series 3: T2 · coronal · 5.0mm · 0.62mm/px · 2 of 16 slices shown (1 of 3)]
[im 1/16]
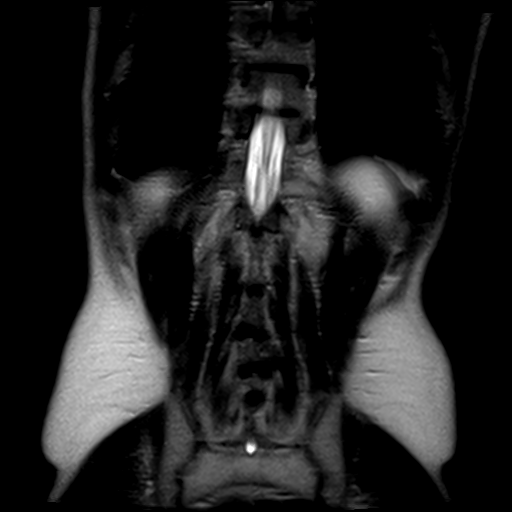
[im 16/16]
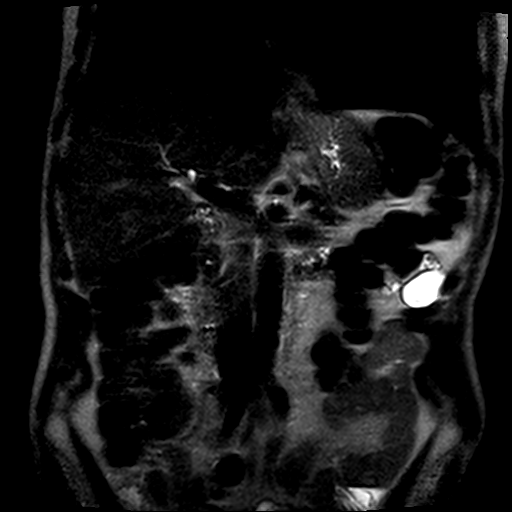

[Series 4: T2 · axial · 5.0mm · 0.62mm/px · z∈[-66,+66]mm · 2 of 23 slices shown (2 of 3)]
[im 1/23]
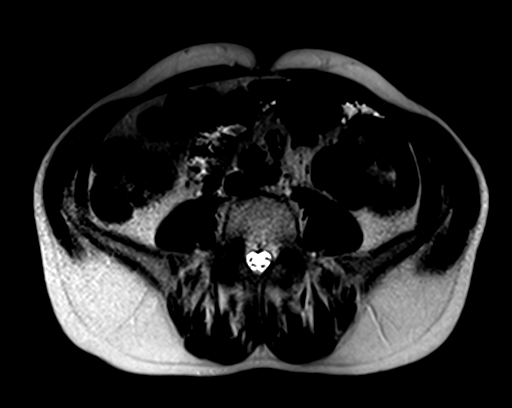
[im 23/23]
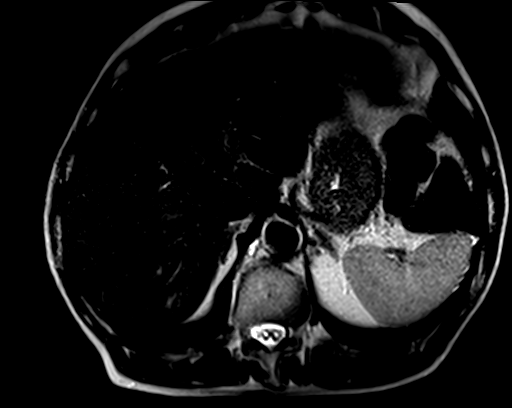

[Series 5: ep2d_diff_b50_500_800_p2_trig · axial · 6.0mm · 1.67mm/px · z∈[-66,+165]mm · 5 of 98 slices shown]
[im 1/98]
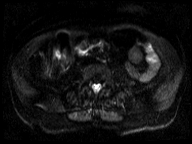
[im 25/98]
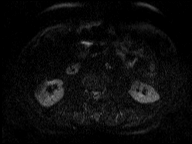
[im 49/98]
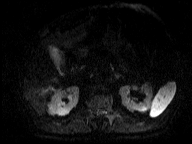
[im 73/98]
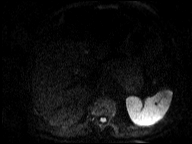
[im 98/98]
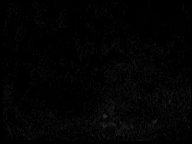

[Series 6: ep2d_diff_b50_500_800_p2_trig_adc · axial · 6.0mm · 1.67mm/px · z∈[-66,+165]mm · 2 of 33 slices shown]
[im 1/33]
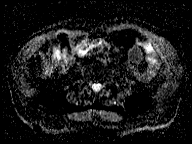
[im 33/33]
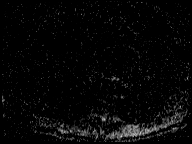

[Series 7: T2 · axial · 6.0mm · 1.00mm/px · z∈[-66,+165]mm · 2 of 33 slices shown (3 of 3)]
[im 1/33]
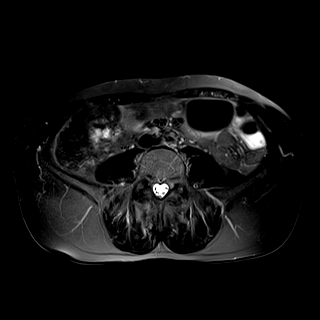
[im 33/33]
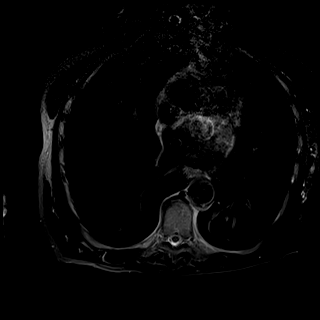

[Series 8: bSSFP · axial · 4.0mm · 0.62mm/px · z∈[-74,+74]mm · 2 of 38 slices shown]
[im 1/38]
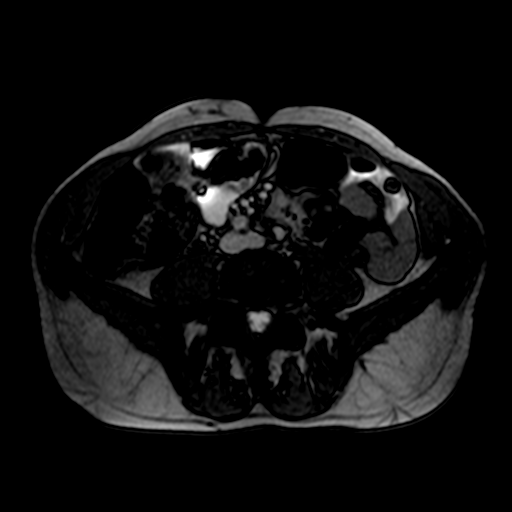
[im 38/38]
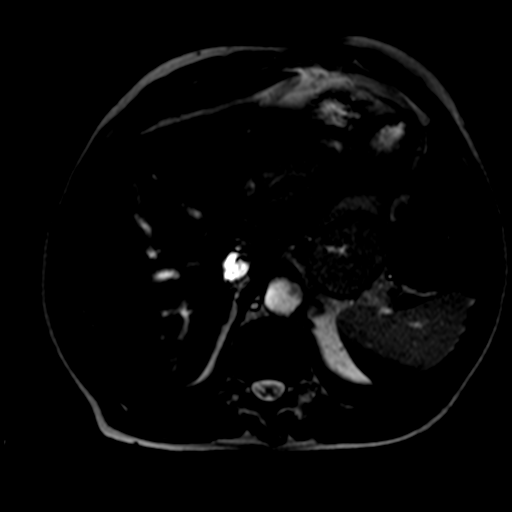

[Series 9: T1 · axial · 5.0mm · 0.62mm/px · z∈[-69,+69]mm · 3 of 48 slices shown]
[im 1/48]
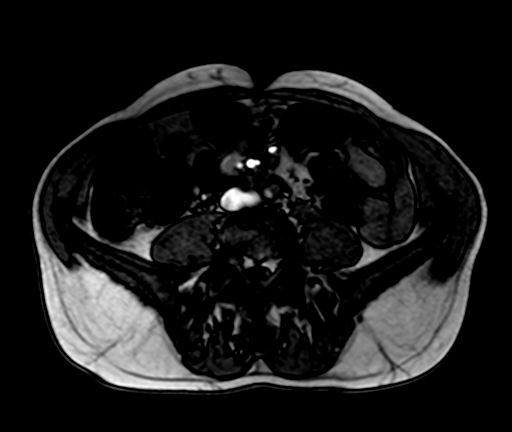
[im 24/48]
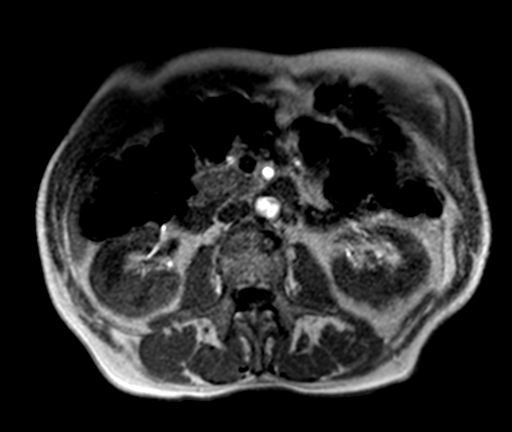
[im 48/48]
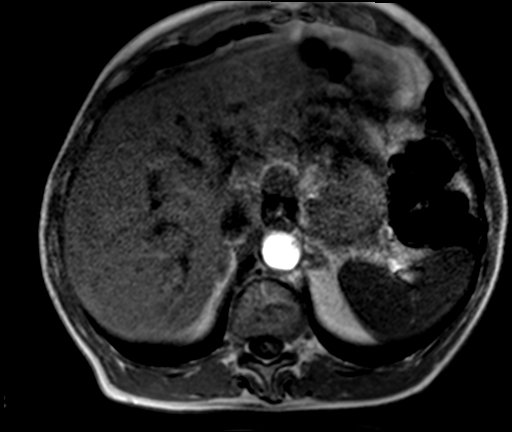

[Series 10: T1 dynamic · axial · non-contrast · 2.3mm · 1.25mm/px · z∈[-68,+67]mm · 3 of 60 slices shown]
[im 1/60]
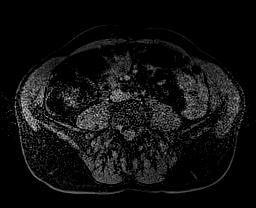
[im 30/60]
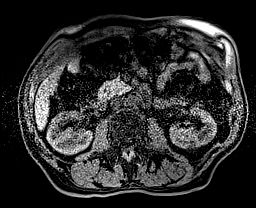
[im 60/60]
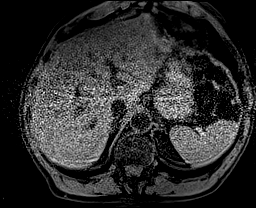

[Series 11: post 25 sec · axial · 2.3mm · 1.25mm/px · z∈[-68,+67]mm · 3 of 60 slices shown]
[im 1/60]
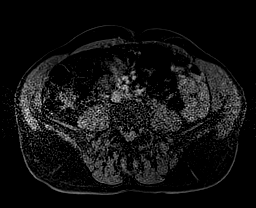
[im 30/60]
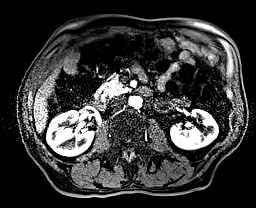
[im 60/60]
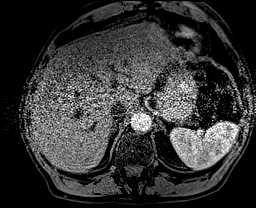

[Series 12: post 25 sec_sub · axial · 2.3mm · 1.25mm/px · z∈[-68,+67]mm · 3 of 60 slices shown]
[im 1/60]
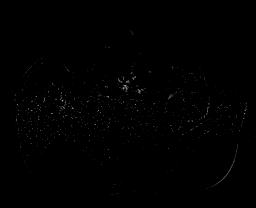
[im 30/60]
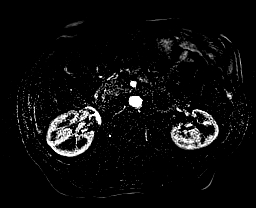
[im 60/60]
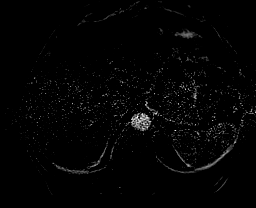

[Series 13: post 45 sec · axial · 2.3mm · 1.25mm/px · z∈[-68,+67]mm · 3 of 60 slices shown]
[im 1/60]
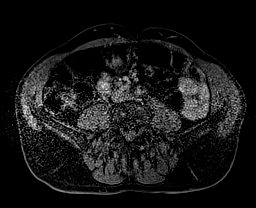
[im 30/60]
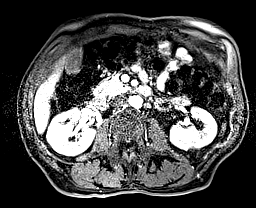
[im 60/60]
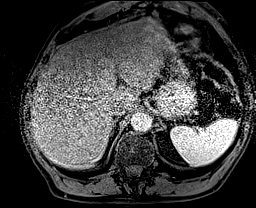

[Series 14: post 45 sec_sub · axial · 2.3mm · 1.25mm/px · z∈[-68,+67]mm · 3 of 60 slices shown]
[im 1/60]
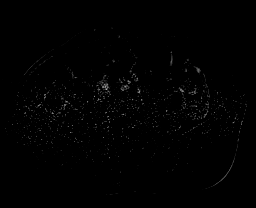
[im 30/60]
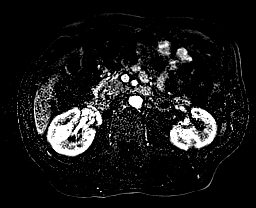
[im 60/60]
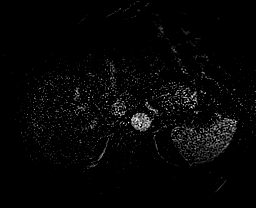

[Series 15: post 90 sec · axial · 2.3mm · 1.25mm/px · z∈[-68,+67]mm · 3 of 60 slices shown]
[im 1/60]
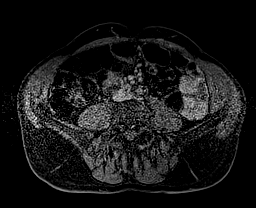
[im 30/60]
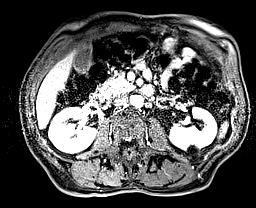
[im 60/60]
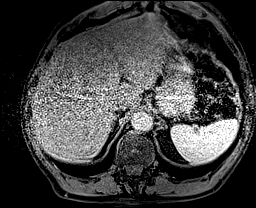

[Series 16: post 90 sec_sub · axial · 2.3mm · 1.25mm/px · z∈[-68,+67]mm · 3 of 60 slices shown]
[im 1/60]
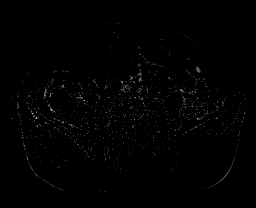
[im 30/60]
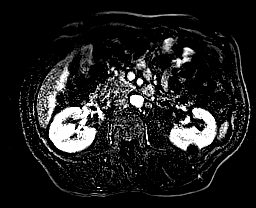
[im 60/60]
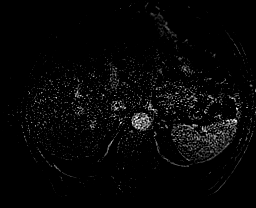

[Series 17: T1 dynamic post-contrast · coronal · 2.6mm · 0.62mm/px · 3 of 52 slices shown]
[im 1/52]
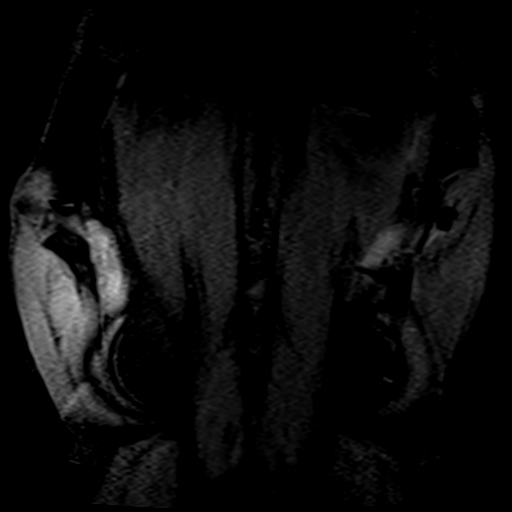
[im 26/52]
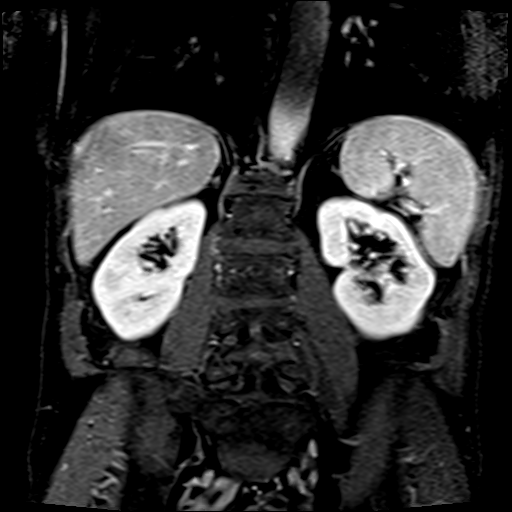
[im 52/52]
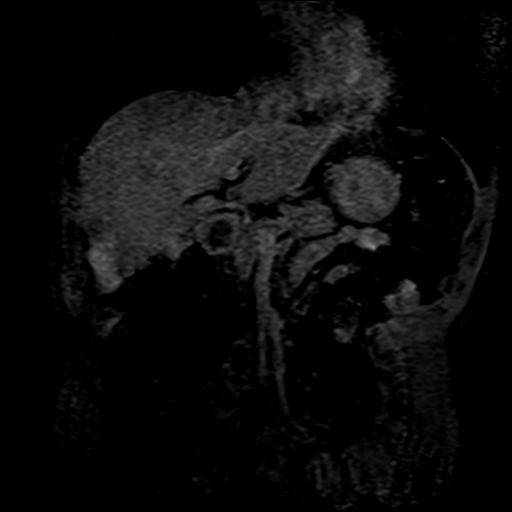

[Series 18: post axial 3+ · axial · 2.3mm · 1.25mm/px · z∈[-68,+67]mm · 3 of 60 slices shown]
[im 1/60]
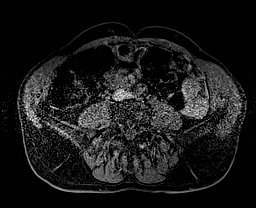
[im 30/60]
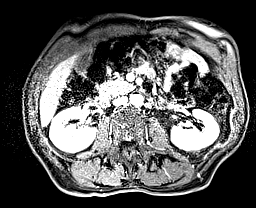
[im 60/60]
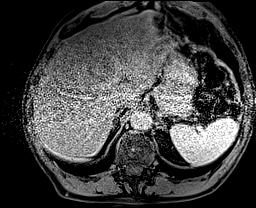

[45 of 48 positions shown; findings below may reference images not displayed]

FINDINGS: Lower chest: Lung bases are clear.

Hepatobiliary: 11 mm cyst in segment 2 (series 7/image 11), benign.
Liver is otherwise within normal limits.

Gallbladder is unremarkable. No intrahepatic or extrahepatic ductal
dilatation.

Pancreas:  Within normal limits.

Spleen:  Within normal limits.

Adrenals/Urinary Tract:  Adrenal glands are within normal limits.

1.9 cm cyst in the posterior left upper kidney (series 4/image 10),
benign (Bosniak I). 1.6 cm cyst in the posterior left upper kidney
(series 4/image 8), with a single thin septation (series 3/image 4),
benign (Bosniak II). Additional 5 mm cyst in the left upper kidney
(series 4/image 6), benign (Bosniak I).

2.5 cm cyst with a single thin septation in the posterior right
upper kidney (series 4/image 10), benign (Bosniak II). No enhancing
renal lesions. No hydronephrosis.

Stomach/Bowel: Stomach is within normal limits.

Visualized bowel is unremarkable.

Vascular/Lymphatic:  No evidence of abdominal aortic aneurysm.

No suspicious abdominal lymphadenopathy.

Other:  No abdominal ascites.

Musculoskeletal: No focal osseous lesions.
IMPRESSION: Bilateral renal cysts, measuring up to 2.5 cm in the posterior right
upper kidney, benign (Bosniak I-II). No enhancing renal lesions.

## 2021-01-08 DIAGNOSIS — L720 Epidermal cyst: Secondary | ICD-10-CM | POA: Diagnosis not present

## 2021-01-08 DIAGNOSIS — D225 Melanocytic nevi of trunk: Secondary | ICD-10-CM | POA: Diagnosis not present

## 2021-01-08 DIAGNOSIS — L438 Other lichen planus: Secondary | ICD-10-CM | POA: Diagnosis not present

## 2021-01-08 DIAGNOSIS — L853 Xerosis cutis: Secondary | ICD-10-CM | POA: Diagnosis not present

## 2021-01-08 DIAGNOSIS — Z85828 Personal history of other malignant neoplasm of skin: Secondary | ICD-10-CM | POA: Diagnosis not present

## 2021-01-08 DIAGNOSIS — L570 Actinic keratosis: Secondary | ICD-10-CM | POA: Diagnosis not present

## 2021-01-08 DIAGNOSIS — L821 Other seborrheic keratosis: Secondary | ICD-10-CM | POA: Diagnosis not present

## 2021-01-08 DIAGNOSIS — L814 Other melanin hyperpigmentation: Secondary | ICD-10-CM | POA: Diagnosis not present

## 2021-02-19 DIAGNOSIS — C61 Malignant neoplasm of prostate: Secondary | ICD-10-CM | POA: Diagnosis not present

## 2021-02-26 DIAGNOSIS — R3915 Urgency of urination: Secondary | ICD-10-CM | POA: Diagnosis not present

## 2021-02-26 DIAGNOSIS — Z8551 Personal history of malignant neoplasm of bladder: Secondary | ICD-10-CM | POA: Diagnosis not present

## 2021-02-26 DIAGNOSIS — C61 Malignant neoplasm of prostate: Secondary | ICD-10-CM | POA: Diagnosis not present

## 2021-02-26 DIAGNOSIS — N403 Nodular prostate with lower urinary tract symptoms: Secondary | ICD-10-CM | POA: Diagnosis not present

## 2021-04-02 DIAGNOSIS — C44529 Squamous cell carcinoma of skin of other part of trunk: Secondary | ICD-10-CM | POA: Diagnosis not present

## 2021-05-08 DIAGNOSIS — I10 Essential (primary) hypertension: Secondary | ICD-10-CM | POA: Diagnosis present

## 2021-05-09 DIAGNOSIS — R7301 Impaired fasting glucose: Secondary | ICD-10-CM | POA: Diagnosis not present

## 2021-05-09 DIAGNOSIS — I10 Essential (primary) hypertension: Secondary | ICD-10-CM | POA: Diagnosis not present

## 2021-05-09 DIAGNOSIS — C61 Malignant neoplasm of prostate: Secondary | ICD-10-CM | POA: Diagnosis not present

## 2021-05-14 DIAGNOSIS — L570 Actinic keratosis: Secondary | ICD-10-CM | POA: Diagnosis not present

## 2021-05-23 DIAGNOSIS — R972 Elevated prostate specific antigen [PSA]: Secondary | ICD-10-CM | POA: Diagnosis not present

## 2021-05-23 DIAGNOSIS — K219 Gastro-esophageal reflux disease without esophagitis: Secondary | ICD-10-CM | POA: Diagnosis not present

## 2021-05-23 DIAGNOSIS — R7303 Prediabetes: Secondary | ICD-10-CM | POA: Diagnosis not present

## 2021-05-23 DIAGNOSIS — I10 Essential (primary) hypertension: Secondary | ICD-10-CM | POA: Diagnosis not present

## 2021-05-23 DIAGNOSIS — C61 Malignant neoplasm of prostate: Secondary | ICD-10-CM | POA: Diagnosis not present

## 2021-05-23 DIAGNOSIS — C4492 Squamous cell carcinoma of skin, unspecified: Secondary | ICD-10-CM | POA: Diagnosis not present

## 2021-06-03 DIAGNOSIS — C61 Malignant neoplasm of prostate: Secondary | ICD-10-CM | POA: Diagnosis not present

## 2021-07-16 DIAGNOSIS — L821 Other seborrheic keratosis: Secondary | ICD-10-CM | POA: Diagnosis not present

## 2021-07-16 DIAGNOSIS — D224 Melanocytic nevi of scalp and neck: Secondary | ICD-10-CM | POA: Diagnosis not present

## 2021-07-16 DIAGNOSIS — L570 Actinic keratosis: Secondary | ICD-10-CM | POA: Diagnosis not present

## 2021-07-16 DIAGNOSIS — Z85828 Personal history of other malignant neoplasm of skin: Secondary | ICD-10-CM | POA: Diagnosis not present

## 2021-07-16 DIAGNOSIS — L814 Other melanin hyperpigmentation: Secondary | ICD-10-CM | POA: Diagnosis not present

## 2021-07-16 DIAGNOSIS — L720 Epidermal cyst: Secondary | ICD-10-CM | POA: Diagnosis not present

## 2021-07-16 DIAGNOSIS — C44519 Basal cell carcinoma of skin of other part of trunk: Secondary | ICD-10-CM | POA: Diagnosis not present

## 2021-08-27 DIAGNOSIS — Z8551 Personal history of malignant neoplasm of bladder: Secondary | ICD-10-CM | POA: Diagnosis not present

## 2021-08-27 DIAGNOSIS — C61 Malignant neoplasm of prostate: Secondary | ICD-10-CM | POA: Diagnosis not present

## 2021-09-03 DIAGNOSIS — R972 Elevated prostate specific antigen [PSA]: Secondary | ICD-10-CM | POA: Diagnosis not present

## 2021-09-03 DIAGNOSIS — N403 Nodular prostate with lower urinary tract symptoms: Secondary | ICD-10-CM | POA: Diagnosis not present

## 2021-09-03 DIAGNOSIS — C61 Malignant neoplasm of prostate: Secondary | ICD-10-CM | POA: Diagnosis not present

## 2021-09-03 DIAGNOSIS — R3915 Urgency of urination: Secondary | ICD-10-CM | POA: Diagnosis not present

## 2021-09-15 DIAGNOSIS — M25562 Pain in left knee: Secondary | ICD-10-CM | POA: Diagnosis not present

## 2021-11-20 DIAGNOSIS — R7303 Prediabetes: Secondary | ICD-10-CM | POA: Diagnosis present

## 2021-11-20 DIAGNOSIS — I10 Essential (primary) hypertension: Secondary | ICD-10-CM | POA: Diagnosis not present

## 2021-11-25 DIAGNOSIS — K219 Gastro-esophageal reflux disease without esophagitis: Secondary | ICD-10-CM | POA: Diagnosis not present

## 2021-11-25 DIAGNOSIS — R7303 Prediabetes: Secondary | ICD-10-CM | POA: Diagnosis not present

## 2021-11-25 DIAGNOSIS — I10 Essential (primary) hypertension: Secondary | ICD-10-CM | POA: Diagnosis not present

## 2021-11-25 DIAGNOSIS — C61 Malignant neoplasm of prostate: Secondary | ICD-10-CM | POA: Diagnosis not present

## 2021-12-03 DIAGNOSIS — C61 Malignant neoplasm of prostate: Secondary | ICD-10-CM | POA: Diagnosis not present

## 2021-12-16 DIAGNOSIS — I10 Essential (primary) hypertension: Secondary | ICD-10-CM | POA: Diagnosis not present

## 2022-01-13 DIAGNOSIS — L570 Actinic keratosis: Secondary | ICD-10-CM | POA: Diagnosis not present

## 2022-01-13 DIAGNOSIS — D485 Neoplasm of uncertain behavior of skin: Secondary | ICD-10-CM | POA: Diagnosis not present

## 2022-01-13 DIAGNOSIS — L814 Other melanin hyperpigmentation: Secondary | ICD-10-CM | POA: Diagnosis not present

## 2022-01-13 DIAGNOSIS — Z85828 Personal history of other malignant neoplasm of skin: Secondary | ICD-10-CM | POA: Diagnosis not present

## 2022-01-13 DIAGNOSIS — L821 Other seborrheic keratosis: Secondary | ICD-10-CM | POA: Diagnosis not present

## 2022-01-13 DIAGNOSIS — D225 Melanocytic nevi of trunk: Secondary | ICD-10-CM | POA: Diagnosis not present

## 2022-02-25 DIAGNOSIS — C61 Malignant neoplasm of prostate: Secondary | ICD-10-CM | POA: Diagnosis not present

## 2022-03-04 DIAGNOSIS — R3915 Urgency of urination: Secondary | ICD-10-CM | POA: Diagnosis not present

## 2022-03-04 DIAGNOSIS — Z8551 Personal history of malignant neoplasm of bladder: Secondary | ICD-10-CM | POA: Diagnosis not present

## 2022-03-05 ENCOUNTER — Other Ambulatory Visit: Payer: Self-pay | Admitting: Urology

## 2022-03-05 DIAGNOSIS — C61 Malignant neoplasm of prostate: Secondary | ICD-10-CM

## 2022-03-05 DIAGNOSIS — R972 Elevated prostate specific antigen [PSA]: Secondary | ICD-10-CM

## 2022-03-21 ENCOUNTER — Ambulatory Visit
Admission: RE | Admit: 2022-03-21 | Discharge: 2022-03-21 | Disposition: A | Discharge status: Home / Self Care | Payer: Medicare HMO | Source: Ambulatory Visit | Attending: Urology | Admitting: Urology

## 2022-03-21 DIAGNOSIS — C61 Malignant neoplasm of prostate: Secondary | ICD-10-CM

## 2022-03-21 DIAGNOSIS — R972 Elevated prostate specific antigen [PSA]: Secondary | ICD-10-CM | POA: Diagnosis not present

## 2022-03-21 MED ORDER — GADOBENATE DIMEGLUMINE 529 MG/ML IV SOLN
13.0000 mL | Freq: Once | INTRAVENOUS | Status: AC | PRN
  Administered 2022-03-21: 13 mL via INTRAVENOUS

## 2022-05-18 DIAGNOSIS — I10 Essential (primary) hypertension: Secondary | ICD-10-CM | POA: Diagnosis not present

## 2022-05-18 DIAGNOSIS — R7303 Prediabetes: Secondary | ICD-10-CM | POA: Diagnosis not present

## 2022-05-26 DIAGNOSIS — Z6820 Body mass index (BMI) 20.0-20.9, adult: Secondary | ICD-10-CM | POA: Diagnosis not present

## 2022-05-26 DIAGNOSIS — K219 Gastro-esophageal reflux disease without esophagitis: Secondary | ICD-10-CM | POA: Diagnosis not present

## 2022-05-26 DIAGNOSIS — C61 Malignant neoplasm of prostate: Secondary | ICD-10-CM | POA: Diagnosis not present

## 2022-05-26 DIAGNOSIS — I10 Essential (primary) hypertension: Secondary | ICD-10-CM | POA: Diagnosis not present

## 2022-05-26 DIAGNOSIS — R7303 Prediabetes: Secondary | ICD-10-CM | POA: Diagnosis not present

## 2022-06-04 DIAGNOSIS — H524 Presbyopia: Secondary | ICD-10-CM | POA: Diagnosis not present

## 2022-06-15 DIAGNOSIS — C61 Malignant neoplasm of prostate: Secondary | ICD-10-CM | POA: Diagnosis not present

## 2022-06-22 DIAGNOSIS — Z8551 Personal history of malignant neoplasm of bladder: Secondary | ICD-10-CM | POA: Diagnosis not present

## 2022-06-22 DIAGNOSIS — R3912 Poor urinary stream: Secondary | ICD-10-CM | POA: Diagnosis not present

## 2022-06-22 DIAGNOSIS — N403 Nodular prostate with lower urinary tract symptoms: Secondary | ICD-10-CM | POA: Diagnosis not present

## 2022-06-22 DIAGNOSIS — R972 Elevated prostate specific antigen [PSA]: Secondary | ICD-10-CM | POA: Diagnosis not present

## 2022-06-22 DIAGNOSIS — C61 Malignant neoplasm of prostate: Secondary | ICD-10-CM | POA: Diagnosis not present

## 2022-07-14 DIAGNOSIS — Z0100 Encounter for examination of eyes and vision without abnormal findings: Secondary | ICD-10-CM | POA: Diagnosis not present

## 2022-07-16 DIAGNOSIS — L821 Other seborrheic keratosis: Secondary | ICD-10-CM | POA: Diagnosis not present

## 2022-07-16 DIAGNOSIS — L570 Actinic keratosis: Secondary | ICD-10-CM | POA: Diagnosis not present

## 2022-07-16 DIAGNOSIS — L814 Other melanin hyperpigmentation: Secondary | ICD-10-CM | POA: Diagnosis not present

## 2022-07-16 DIAGNOSIS — Z85828 Personal history of other malignant neoplasm of skin: Secondary | ICD-10-CM | POA: Diagnosis not present

## 2022-07-16 DIAGNOSIS — D044 Carcinoma in situ of skin of scalp and neck: Secondary | ICD-10-CM | POA: Diagnosis not present

## 2022-10-12 DIAGNOSIS — M25512 Pain in left shoulder: Secondary | ICD-10-CM | POA: Diagnosis not present

## 2022-10-12 DIAGNOSIS — M7502 Adhesive capsulitis of left shoulder: Secondary | ICD-10-CM | POA: Diagnosis not present

## 2022-11-20 DIAGNOSIS — I10 Essential (primary) hypertension: Secondary | ICD-10-CM | POA: Diagnosis not present

## 2022-11-20 DIAGNOSIS — R7303 Prediabetes: Secondary | ICD-10-CM | POA: Diagnosis not present

## 2022-11-26 DIAGNOSIS — Z87891 Personal history of nicotine dependence: Secondary | ICD-10-CM | POA: Diagnosis not present

## 2022-11-26 DIAGNOSIS — I10 Essential (primary) hypertension: Secondary | ICD-10-CM | POA: Diagnosis not present

## 2022-11-26 DIAGNOSIS — Z0000 Encounter for general adult medical examination without abnormal findings: Secondary | ICD-10-CM | POA: Diagnosis not present

## 2022-11-26 DIAGNOSIS — K219 Gastro-esophageal reflux disease without esophagitis: Secondary | ICD-10-CM | POA: Diagnosis not present

## 2022-11-26 DIAGNOSIS — R7303 Prediabetes: Secondary | ICD-10-CM | POA: Diagnosis not present

## 2022-11-26 DIAGNOSIS — Z79899 Other long term (current) drug therapy: Secondary | ICD-10-CM | POA: Diagnosis not present

## 2022-11-26 DIAGNOSIS — C61 Malignant neoplasm of prostate: Secondary | ICD-10-CM | POA: Diagnosis not present

## 2023-01-15 DIAGNOSIS — L570 Actinic keratosis: Secondary | ICD-10-CM | POA: Diagnosis not present

## 2023-01-15 DIAGNOSIS — C44529 Squamous cell carcinoma of skin of other part of trunk: Secondary | ICD-10-CM | POA: Diagnosis not present

## 2023-01-15 DIAGNOSIS — Z85828 Personal history of other malignant neoplasm of skin: Secondary | ICD-10-CM | POA: Diagnosis not present

## 2023-01-15 DIAGNOSIS — L814 Other melanin hyperpigmentation: Secondary | ICD-10-CM | POA: Diagnosis not present

## 2023-01-15 DIAGNOSIS — D045 Carcinoma in situ of skin of trunk: Secondary | ICD-10-CM | POA: Diagnosis not present

## 2023-01-15 DIAGNOSIS — L720 Epidermal cyst: Secondary | ICD-10-CM | POA: Diagnosis not present

## 2023-01-15 DIAGNOSIS — L821 Other seborrheic keratosis: Secondary | ICD-10-CM | POA: Diagnosis not present

## 2023-01-20 DIAGNOSIS — C61 Malignant neoplasm of prostate: Secondary | ICD-10-CM | POA: Diagnosis not present

## 2023-01-25 DIAGNOSIS — D045 Carcinoma in situ of skin of trunk: Secondary | ICD-10-CM | POA: Diagnosis not present

## 2023-01-27 DIAGNOSIS — Z8551 Personal history of malignant neoplasm of bladder: Secondary | ICD-10-CM | POA: Diagnosis not present

## 2023-01-27 DIAGNOSIS — R3915 Urgency of urination: Secondary | ICD-10-CM | POA: Diagnosis not present

## 2023-01-27 DIAGNOSIS — C61 Malignant neoplasm of prostate: Secondary | ICD-10-CM | POA: Diagnosis not present

## 2023-01-27 DIAGNOSIS — N403 Nodular prostate with lower urinary tract symptoms: Secondary | ICD-10-CM | POA: Diagnosis not present

## 2023-03-11 ENCOUNTER — Other Ambulatory Visit: Payer: Self-pay | Admitting: Urology

## 2023-03-11 DIAGNOSIS — C61 Malignant neoplasm of prostate: Secondary | ICD-10-CM

## 2023-03-11 DIAGNOSIS — N403 Nodular prostate with lower urinary tract symptoms: Secondary | ICD-10-CM

## 2023-03-18 DIAGNOSIS — H25811 Combined forms of age-related cataract, right eye: Secondary | ICD-10-CM | POA: Diagnosis not present

## 2023-03-18 DIAGNOSIS — H25812 Combined forms of age-related cataract, left eye: Secondary | ICD-10-CM | POA: Diagnosis not present

## 2023-03-18 DIAGNOSIS — H353131 Nonexudative age-related macular degeneration, bilateral, early dry stage: Secondary | ICD-10-CM | POA: Diagnosis not present

## 2023-04-01 DIAGNOSIS — H25812 Combined forms of age-related cataract, left eye: Secondary | ICD-10-CM | POA: Diagnosis not present

## 2023-04-15 DIAGNOSIS — H25811 Combined forms of age-related cataract, right eye: Secondary | ICD-10-CM | POA: Diagnosis not present

## 2023-04-15 DIAGNOSIS — H2511 Age-related nuclear cataract, right eye: Secondary | ICD-10-CM | POA: Diagnosis not present

## 2023-04-19 DIAGNOSIS — C61 Malignant neoplasm of prostate: Secondary | ICD-10-CM | POA: Diagnosis not present

## 2023-04-30 ENCOUNTER — Encounter: Payer: Self-pay | Admitting: Urology

## 2023-05-05 ENCOUNTER — Ambulatory Visit
Admission: RE | Admit: 2023-05-05 | Discharge: 2023-05-05 | Disposition: A | Discharge status: Home / Self Care | Payer: Medicare HMO | Source: Ambulatory Visit | Attending: Urology | Admitting: Urology

## 2023-05-05 DIAGNOSIS — C61 Malignant neoplasm of prostate: Secondary | ICD-10-CM

## 2023-05-05 DIAGNOSIS — N400 Enlarged prostate without lower urinary tract symptoms: Secondary | ICD-10-CM | POA: Diagnosis not present

## 2023-05-05 DIAGNOSIS — N403 Nodular prostate with lower urinary tract symptoms: Secondary | ICD-10-CM

## 2023-05-05 MED ORDER — GADOPICLENOL 0.5 MMOL/ML IV SOLN
6.0000 mL | Freq: Once | INTRAVENOUS | Status: AC | PRN
  Administered 2023-05-05: 6 mL via INTRAVENOUS

## 2023-05-11 DIAGNOSIS — Z0100 Encounter for examination of eyes and vision without abnormal findings: Secondary | ICD-10-CM | POA: Diagnosis not present

## 2023-05-19 DIAGNOSIS — R3912 Poor urinary stream: Secondary | ICD-10-CM | POA: Diagnosis not present

## 2023-05-19 DIAGNOSIS — Z8551 Personal history of malignant neoplasm of bladder: Secondary | ICD-10-CM | POA: Diagnosis not present

## 2023-05-19 DIAGNOSIS — C61 Malignant neoplasm of prostate: Secondary | ICD-10-CM | POA: Diagnosis not present

## 2023-05-19 DIAGNOSIS — N403 Nodular prostate with lower urinary tract symptoms: Secondary | ICD-10-CM | POA: Diagnosis not present

## 2023-05-21 DIAGNOSIS — I10 Essential (primary) hypertension: Secondary | ICD-10-CM | POA: Diagnosis not present

## 2023-05-21 DIAGNOSIS — R7303 Prediabetes: Secondary | ICD-10-CM | POA: Diagnosis not present

## 2023-05-27 DIAGNOSIS — C61 Malignant neoplasm of prostate: Secondary | ICD-10-CM | POA: Diagnosis not present

## 2023-05-27 DIAGNOSIS — I10 Essential (primary) hypertension: Secondary | ICD-10-CM | POA: Diagnosis not present

## 2023-05-27 DIAGNOSIS — Z6820 Body mass index (BMI) 20.0-20.9, adult: Secondary | ICD-10-CM | POA: Diagnosis not present

## 2023-05-27 DIAGNOSIS — Z713 Dietary counseling and surveillance: Secondary | ICD-10-CM | POA: Diagnosis not present

## 2023-05-27 DIAGNOSIS — K219 Gastro-esophageal reflux disease without esophagitis: Secondary | ICD-10-CM | POA: Diagnosis not present

## 2023-05-27 DIAGNOSIS — Z79899 Other long term (current) drug therapy: Secondary | ICD-10-CM | POA: Diagnosis not present

## 2023-05-27 DIAGNOSIS — R7303 Prediabetes: Secondary | ICD-10-CM | POA: Diagnosis not present

## 2023-05-27 DIAGNOSIS — Z87891 Personal history of nicotine dependence: Secondary | ICD-10-CM | POA: Diagnosis not present

## 2023-06-03 DIAGNOSIS — H903 Sensorineural hearing loss, bilateral: Secondary | ICD-10-CM | POA: Diagnosis not present

## 2023-06-03 DIAGNOSIS — Z822 Family history of deafness and hearing loss: Secondary | ICD-10-CM | POA: Diagnosis not present

## 2023-06-03 DIAGNOSIS — Z570 Occupational exposure to noise: Secondary | ICD-10-CM | POA: Diagnosis not present

## 2023-07-15 DIAGNOSIS — L821 Other seborrheic keratosis: Secondary | ICD-10-CM | POA: Diagnosis not present

## 2023-07-15 DIAGNOSIS — L720 Epidermal cyst: Secondary | ICD-10-CM | POA: Diagnosis not present

## 2023-07-15 DIAGNOSIS — L570 Actinic keratosis: Secondary | ICD-10-CM | POA: Diagnosis not present

## 2023-07-15 DIAGNOSIS — L814 Other melanin hyperpigmentation: Secondary | ICD-10-CM | POA: Diagnosis not present

## 2023-07-15 DIAGNOSIS — Z85828 Personal history of other malignant neoplasm of skin: Secondary | ICD-10-CM | POA: Diagnosis not present

## 2023-07-15 DIAGNOSIS — C44329 Squamous cell carcinoma of skin of other parts of face: Secondary | ICD-10-CM | POA: Diagnosis not present

## 2023-11-23 DIAGNOSIS — I10 Essential (primary) hypertension: Secondary | ICD-10-CM | POA: Diagnosis not present

## 2023-11-23 DIAGNOSIS — R7303 Prediabetes: Secondary | ICD-10-CM | POA: Diagnosis not present

## 2023-11-30 DIAGNOSIS — I10 Essential (primary) hypertension: Secondary | ICD-10-CM | POA: Diagnosis not present

## 2023-11-30 DIAGNOSIS — K219 Gastro-esophageal reflux disease without esophagitis: Secondary | ICD-10-CM | POA: Diagnosis not present

## 2023-11-30 DIAGNOSIS — R7303 Prediabetes: Secondary | ICD-10-CM | POA: Diagnosis not present

## 2023-11-30 DIAGNOSIS — C61 Malignant neoplasm of prostate: Secondary | ICD-10-CM | POA: Diagnosis not present

## 2023-12-08 ENCOUNTER — Ambulatory Visit (HOSPITAL_COMMUNITY)
Admission: RE | Admit: 2023-12-08 | Discharge: 2023-12-08 | Disposition: A | Discharge status: Home / Self Care | Payer: Medicare HMO | Source: Ambulatory Visit | Attending: Urology | Admitting: Urology

## 2023-12-08 ENCOUNTER — Other Ambulatory Visit (HOSPITAL_COMMUNITY): Payer: Self-pay | Admitting: Urology

## 2023-12-08 DIAGNOSIS — M8580 Other specified disorders of bone density and structure, unspecified site: Secondary | ICD-10-CM | POA: Diagnosis not present

## 2023-12-08 DIAGNOSIS — M47816 Spondylosis without myelopathy or radiculopathy, lumbar region: Secondary | ICD-10-CM | POA: Diagnosis not present

## 2023-12-08 DIAGNOSIS — R319 Hematuria, unspecified: Secondary | ICD-10-CM | POA: Diagnosis not present

## 2023-12-08 DIAGNOSIS — M47814 Spondylosis without myelopathy or radiculopathy, thoracic region: Secondary | ICD-10-CM | POA: Diagnosis not present

## 2023-12-08 DIAGNOSIS — M5136 Other intervertebral disc degeneration, lumbar region with discogenic back pain only: Secondary | ICD-10-CM | POA: Diagnosis not present

## 2023-12-08 DIAGNOSIS — M545 Low back pain, unspecified: Secondary | ICD-10-CM | POA: Insufficient documentation

## 2024-01-20 DIAGNOSIS — Z85828 Personal history of other malignant neoplasm of skin: Secondary | ICD-10-CM | POA: Diagnosis not present

## 2024-01-20 DIAGNOSIS — L814 Other melanin hyperpigmentation: Secondary | ICD-10-CM | POA: Diagnosis not present

## 2024-01-20 DIAGNOSIS — L821 Other seborrheic keratosis: Secondary | ICD-10-CM | POA: Diagnosis not present

## 2024-01-20 DIAGNOSIS — D0439 Carcinoma in situ of skin of other parts of face: Secondary | ICD-10-CM | POA: Diagnosis not present

## 2024-01-20 DIAGNOSIS — L570 Actinic keratosis: Secondary | ICD-10-CM | POA: Diagnosis not present

## 2024-01-20 DIAGNOSIS — D692 Other nonthrombocytopenic purpura: Secondary | ICD-10-CM | POA: Diagnosis not present

## 2024-01-20 DIAGNOSIS — D485 Neoplasm of uncertain behavior of skin: Secondary | ICD-10-CM | POA: Diagnosis not present

## 2024-01-20 DIAGNOSIS — D044 Carcinoma in situ of skin of scalp and neck: Secondary | ICD-10-CM | POA: Diagnosis not present

## 2024-05-31 DIAGNOSIS — Z8551 Personal history of malignant neoplasm of bladder: Secondary | ICD-10-CM | POA: Diagnosis not present

## 2024-05-31 DIAGNOSIS — C61 Malignant neoplasm of prostate: Secondary | ICD-10-CM | POA: Diagnosis not present

## 2024-05-31 DIAGNOSIS — R351 Nocturia: Secondary | ICD-10-CM | POA: Diagnosis not present

## 2024-05-31 DIAGNOSIS — N402 Nodular prostate without lower urinary tract symptoms: Secondary | ICD-10-CM | POA: Diagnosis not present

## 2024-06-05 DIAGNOSIS — I10 Essential (primary) hypertension: Secondary | ICD-10-CM | POA: Diagnosis not present

## 2024-06-05 DIAGNOSIS — R7303 Prediabetes: Secondary | ICD-10-CM | POA: Diagnosis not present

## 2024-06-08 DIAGNOSIS — R7303 Prediabetes: Secondary | ICD-10-CM | POA: Diagnosis not present

## 2024-06-08 DIAGNOSIS — K219 Gastro-esophageal reflux disease without esophagitis: Secondary | ICD-10-CM | POA: Diagnosis not present

## 2024-06-08 DIAGNOSIS — C61 Malignant neoplasm of prostate: Secondary | ICD-10-CM | POA: Diagnosis not present

## 2024-06-08 DIAGNOSIS — I10 Essential (primary) hypertension: Secondary | ICD-10-CM | POA: Diagnosis not present

## 2024-06-08 DIAGNOSIS — Z87891 Personal history of nicotine dependence: Secondary | ICD-10-CM | POA: Diagnosis not present

## 2024-06-16 ENCOUNTER — Other Ambulatory Visit (HOSPITAL_COMMUNITY): Payer: Self-pay | Admitting: Internal Medicine

## 2024-06-16 DIAGNOSIS — R109 Unspecified abdominal pain: Secondary | ICD-10-CM

## 2024-06-17 ENCOUNTER — Ambulatory Visit (HOSPITAL_BASED_OUTPATIENT_CLINIC_OR_DEPARTMENT_OTHER)
Admission: RE | Admit: 2024-06-17 | Discharge: 2024-06-17 | Disposition: A | Discharge status: Home / Self Care | Source: Ambulatory Visit | Attending: Internal Medicine | Admitting: Internal Medicine

## 2024-06-17 DIAGNOSIS — R109 Unspecified abdominal pain: Secondary | ICD-10-CM | POA: Diagnosis not present

## 2024-06-17 DIAGNOSIS — K7689 Other specified diseases of liver: Secondary | ICD-10-CM | POA: Diagnosis not present

## 2024-06-17 DIAGNOSIS — N289 Disorder of kidney and ureter, unspecified: Secondary | ICD-10-CM | POA: Diagnosis not present

## 2024-06-17 DIAGNOSIS — R1084 Generalized abdominal pain: Secondary | ICD-10-CM | POA: Diagnosis not present

## 2024-06-17 DIAGNOSIS — N281 Cyst of kidney, acquired: Secondary | ICD-10-CM | POA: Diagnosis not present

## 2024-06-20 ENCOUNTER — Emergency Department (HOSPITAL_COMMUNITY)
Admission: EM | Admit: 2024-06-20 | Discharge: 2024-06-20 | Disposition: A | Discharge status: Home / Self Care | Source: Ambulatory Visit | Attending: Emergency Medicine | Admitting: Emergency Medicine

## 2024-06-20 ENCOUNTER — Other Ambulatory Visit (HOSPITAL_COMMUNITY): Payer: Self-pay | Admitting: Internal Medicine

## 2024-06-20 ENCOUNTER — Emergency Department (HOSPITAL_COMMUNITY)

## 2024-06-20 DIAGNOSIS — E876 Hypokalemia: Secondary | ICD-10-CM | POA: Insufficient documentation

## 2024-06-20 DIAGNOSIS — E871 Hypo-osmolality and hyponatremia: Secondary | ICD-10-CM | POA: Insufficient documentation

## 2024-06-20 DIAGNOSIS — Z79899 Other long term (current) drug therapy: Secondary | ICD-10-CM | POA: Diagnosis not present

## 2024-06-20 DIAGNOSIS — N400 Enlarged prostate without lower urinary tract symptoms: Secondary | ICD-10-CM | POA: Diagnosis not present

## 2024-06-20 DIAGNOSIS — K2980 Duodenitis without bleeding: Secondary | ICD-10-CM | POA: Diagnosis not present

## 2024-06-20 DIAGNOSIS — K449 Diaphragmatic hernia without obstruction or gangrene: Secondary | ICD-10-CM | POA: Diagnosis not present

## 2024-06-20 DIAGNOSIS — K529 Noninfective gastroenteritis and colitis, unspecified: Secondary | ICD-10-CM

## 2024-06-20 DIAGNOSIS — R109 Unspecified abdominal pain: Secondary | ICD-10-CM | POA: Diagnosis not present

## 2024-06-20 DIAGNOSIS — Z8546 Personal history of malignant neoplasm of prostate: Secondary | ICD-10-CM | POA: Diagnosis not present

## 2024-06-20 DIAGNOSIS — R739 Hyperglycemia, unspecified: Secondary | ICD-10-CM | POA: Insufficient documentation

## 2024-06-20 DIAGNOSIS — I10 Essential (primary) hypertension: Secondary | ICD-10-CM | POA: Diagnosis not present

## 2024-06-20 DIAGNOSIS — K7689 Other specified diseases of liver: Secondary | ICD-10-CM | POA: Diagnosis not present

## 2024-06-20 LAB — CBC WITH DIFFERENTIAL/PLATELET
Abs Granulocyte: 7.3 K/uL — ABNORMAL HIGH (ref 1.5–6.5)
Abs Immature Granulocytes: 0.04 K/uL (ref 0.00–0.07)
Basophils Absolute: 0 K/uL (ref 0.0–0.1)
Basophils Relative: 0 %
Eosinophils Absolute: 0.1 K/uL (ref 0.0–0.5)
Eosinophils Relative: 1 %
HCT: 43.4 % (ref 39.0–52.0)
Hemoglobin: 14.8 g/dL (ref 13.0–17.0)
Immature Granulocytes: 0 %
Lymphocytes Relative: 7 %
Lymphs Abs: 0.7 K/uL (ref 0.7–4.0)
MCH: 29.9 pg (ref 26.0–34.0)
MCHC: 34.1 g/dL (ref 30.0–36.0)
MCV: 87.7 fL (ref 80.0–100.0)
Monocytes Absolute: 1.1 K/uL — ABNORMAL HIGH (ref 0.1–1.0)
Monocytes Relative: 12 %
Neutro Abs: 7.3 K/uL (ref 1.7–7.7)
Neutrophils Relative %: 80 %
Platelets: 325 K/uL (ref 150–400)
RBC: 4.95 MIL/uL (ref 4.22–5.81)
RDW: 13.2 % (ref 11.5–15.5)
Smear Review: NORMAL
WBC: 9.3 K/uL (ref 4.0–10.5)
nRBC: 0 % (ref 0.0–0.2)

## 2024-06-20 LAB — LACTIC ACID, PLASMA: Lactic Acid, Venous: 1.3 mmol/L (ref 0.5–1.9)

## 2024-06-20 LAB — URINALYSIS, ROUTINE W REFLEX MICROSCOPIC
Bilirubin Urine: NEGATIVE
Glucose, UA: NEGATIVE mg/dL
Hgb urine dipstick: NEGATIVE
Ketones, ur: NEGATIVE mg/dL
Leukocytes,Ua: NEGATIVE
Nitrite: NEGATIVE
Protein, ur: NEGATIVE mg/dL
Specific Gravity, Urine: 1.021 (ref 1.005–1.030)
pH: 6 (ref 5.0–8.0)

## 2024-06-20 LAB — COMPREHENSIVE METABOLIC PANEL WITH GFR
ALT: 21 U/L (ref 0–44)
AST: 20 U/L (ref 15–41)
Albumin: 4 g/dL (ref 3.5–5.0)
Alkaline Phosphatase: 108 U/L (ref 38–126)
Anion gap: 15 (ref 5–15)
BUN: 11 mg/dL (ref 8–23)
CO2: 25 mmol/L (ref 22–32)
Calcium: 8.7 mg/dL — ABNORMAL LOW (ref 8.9–10.3)
Chloride: 89 mmol/L — ABNORMAL LOW (ref 98–111)
Creatinine, Ser: 0.79 mg/dL (ref 0.61–1.24)
GFR, Estimated: >60 mL/min (ref >60)
Glucose, Bld: 148 mg/dL — ABNORMAL HIGH (ref 70–99)
Potassium: 3.3 mmol/L — ABNORMAL LOW (ref 3.5–5.1)
Sodium: 129 mmol/L — ABNORMAL LOW (ref 135–145)
Total Bilirubin: 0.7 mg/dL (ref 0.0–1.2)
Total Protein: 7.2 g/dL (ref 6.5–8.1)

## 2024-06-20 LAB — MAGNESIUM: Magnesium: 1.9 mg/dL (ref 1.7–2.4)

## 2024-06-20 MED ORDER — ONDANSETRON HCL 4 MG PO TABS: 4.0000 mg | ORAL_TABLET | Freq: Four times a day (QID) | ORAL | 0 refills | Status: DC

## 2024-06-20 MED ORDER — IOHEXOL 300 MG/ML  SOLN
100.0000 mL | Freq: Once | INTRAMUSCULAR | Status: AC | PRN
  Administered 2024-06-20 (×2): 100 mL via INTRAVENOUS

## 2024-06-20 MED ORDER — PANTOPRAZOLE SODIUM 40 MG PO TBEC: 40.0000 mg | DELAYED_RELEASE_TABLET | Freq: Every day | ORAL | 1 refills | Status: DC

## 2024-06-20 MED ORDER — DICYCLOMINE HCL 20 MG PO TABS: 20.0000 mg | ORAL_TABLET | Freq: Two times a day (BID) | ORAL | 0 refills | Status: DC

## 2024-06-20 MED ORDER — AMOXICILLIN-POT CLAVULANATE 875-125 MG PO TABS: 1.0000 | ORAL_TABLET | Freq: Two times a day (BID) | ORAL | 0 refills | Status: DC

## 2024-06-20 MED ORDER — PIPERACILLIN-TAZOBACTAM 3.375 G IVPB 30 MIN
3.3750 g | Freq: Once | INTRAVENOUS | Status: AC
  Administered 2024-06-20 (×2): 3.375 g via INTRAVENOUS
  Filled 2024-06-20: qty 50

## 2024-06-20 NOTE — ED Triage Notes (Addendum)
 Pt comes with abd pain for the past 3 weeks with fevers of 102. Pt has never had anything like this before. Pt is peeing pooping okay.   Pt went to PCP Friday, did u/s but it was negative. Pt is waiting to do a CT.   Last Bm was this morning. Pt is A&Ox4.

## 2024-06-20 NOTE — Discharge Instructions (Addendum)
 Your testing today shows that you have some inflammation and likely infection of your small bowel, this is something that needs to be treated with an antibiotic.  We have prescribed this for you, it is called Augmentin  twice a day, drink plenty of clear liquids and make sure that you are following up with the GI specialist Dr. Cinderella or the GI specialist of your choice.  I have given you the phone number above, please call in the morning to make an appointment  Please change your acid reflux medicine to Protonix  1 capsule/day  Zofran  as needed for nausea every 6 hours  Thank you for allowing us  to treat you in the emergency department today.  After reviewing your examination and potential testing that was done it appears that you are safe to go home.  I would like for you to follow-up with your doctor within the next several days, have them obtain your records and follow-up with them to review all potential tests and results from your visit.  If you should develop severe or worsening symptoms return to the emergency department immediately

## 2024-06-20 NOTE — ED Provider Notes (Signed)
 [place] EMERGENCY DEPARTMENT AT [facility] Provider Note   CSN: [id number] Arrival date & time: 06/20/24  1714     Patient presents with: Nausea, Abdominal Pain, and Fever (102)   Noah Burnett is a 83 y.o. male.    Abdominal Pain Associated symptoms: fever   Fever  This patient is an 83 year old male with a history of hypertension, he has a history of acid reflux and has a history of a inguinal hernia in the past, he has never had any abdominal surgery.  He does have a history of a tumor removal off of his bladder which he was told was noncancerous but also has a history of malignant prostate cancer which up to this point they are not concerned about metastasizing.  He has recently seen his urologist within the last month.  He presents with approximately 1 month of intermittent abdominal pain, intermittent fevers, no vomiting or diarrhea, no consistent dysuria, no back pain no rashes no swelling no coughing no shortness of breath no chest pain.  He was seen by his family doctor, ultrasound revealed that there was some type of abnormal finding on one of his kidneys that was concerning and a CT scan was recommended, it has been ordered but not completed.  Because his abdominal pain was present today he requested to come to the ER.    They are reporting temperatures as high as 102 at home, he was placed on an antibiotic by his family doctor treating for possible infection although they report that they did not know specifically what they were treating but only because of the fever they started the antibiotic.  Prior to Admission medications   Medication Sig Start Date End Date Taking? Authorizing Provider  amLODipine (NORVASC) 2.5 MG tablet Take 2.5 mg by mouth every evening.    [provider]  cephALEXin  (KEFLEX ) 500 MG capsule Take 1 capsule (500 mg total) by mouth 2 (two) times daily. 02/05/20   Zavitz, Joshua, MD  hydrochlorothiazide (MICROZIDE) 12.5 MG capsule  Take 12.5 mg by mouth daily.  02/25/18   [provider]  HYDROcodone -acetaminophen  (NORCO) 5-325 MG tablet Take 1 tablet by mouth every 6 (six) hours as needed for moderate pain. 02/01/20   Wrenn, John, MD  omeprazole (PRILOSEC) 40 MG capsule Take 40 mg by mouth daily.  02/25/18   [provider]    Allergies: Haemophilus influenzae vaccines    Review of Systems  Constitutional:  Positive for fever.  Gastrointestinal:  Positive for abdominal pain.  All other systems reviewed and are negative.   Updated Vital Signs BP (!) 144/62 (BP Location: Right Arm)   Pulse 61   Temp 99.9 F (37.7 C) (Oral)   Resp 17   Ht 1.702 m (5' 7)   Wt 55.8 kg   SpO2 100%   BMI 19.26 kg/m   Physical Exam Vitals and nursing note reviewed.  Constitutional:      General: He is not in acute distress.    Appearance: He is well-developed.  HENT:     Head: Normocephalic and atraumatic.     Mouth/Throat:     Pharynx: No oropharyngeal exudate.  Eyes:     General: No scleral icterus.       Right eye: No discharge.        Left eye: No discharge.     Conjunctiva/sclera: Conjunctivae normal.     Pupils: Pupils are equal, round, and reactive to light.  Neck:  Thyroid: No thyromegaly.     Vascular: No JVD.  Cardiovascular:     Rate and Rhythm: Normal rate and regular rhythm.     Heart sounds: Normal heart sounds. No murmur heard.    No friction rub. No gallop.  Pulmonary:     Effort: Pulmonary effort is normal. No respiratory distress.     Breath sounds: Normal breath sounds. No wheezing or rales.  Abdominal:     General: Bowel sounds are normal. There is no distension.     Palpations: Abdomen is soft. There is no mass.     Tenderness: There is abdominal tenderness.     Comments: Mild mid and left-sided abdominal tenderness without guarding, no right-sided tenderness, no Murphy sign  Musculoskeletal:        General: No tenderness. Normal range of motion.     Cervical back: Normal  range of motion and neck supple.  Lymphadenopathy:     Cervical: No cervical adenopathy.  Skin:    General: Skin is warm and dry.     Findings: No erythema or rash.  Neurological:     Mental Status: He is alert.     Coordination: Coordination normal.  Psychiatric:        Behavior: Behavior normal.     (all labs ordered are listed, but only abnormal results are displayed) Labs Reviewed  CBC WITH DIFFERENTIAL/PLATELET - Abnormal; Notable for the following components:      Result Value   Monocytes Absolute 1.1 (*)    Abs Granulocyte 7.3 (*)    All other components within normal limits  COMPREHENSIVE METABOLIC PANEL WITH GFR - Abnormal; Notable for the following components:   Sodium 129 (*)    Potassium 3.3 (*)    Chloride 89 (*)    Glucose, Bld 148 (*)    Calcium 8.7 (*)    All other components within normal limits  MAGNESIUM  URINALYSIS, ROUTINE W REFLEX MICROSCOPIC  LACTIC ACID, PLASMA  LACTIC ACID, PLASMA    EKG: None  Radiology: No results found.   Procedures   Medications Ordered in the ED - No data to display                                  Medical Decision Making Amount and/or Complexity of Data Reviewed Labs: ordered. Radiology: ordered.  Risk Prescription drug management.    This patient presents to the ED for concern of abdominal pain with intermittent fevers, this involves an extensive number of treatment options, and is a complaint that carries with it a high risk of complications and morbidity.  The differential diagnosis includes diverticulitis, abscess, appendicitis, pyelonephritis, cystitis, fevers could be coming from other source such as a lymphoproliferative type illness, denies any rashes or tick bites but would consider Lyme's type exposure   Co morbidities / Chronic conditions that complicate the patient evaluation  Hypertension   Additional history obtained:  Additional history obtained from EMR External records from outside  source obtained and reviewed including medical record including prior ultrasound performed at outside facility   Lab Tests:  I Ordered, and personally interpreted labs.  The pertinent results include: Urinalysis without acute findings, lactate is normal, CBC is normal without leukocytosis, metabolic panel shows mild hyponatremia and hypokalemia and mild hyperglycemia, magnesium is normal   Imaging Studies ordered:  I ordered imaging studies including CT scan of the abdomen and pelvis with contrast I independently visualized  and interpreted imaging which showed there is bowel wall thickening of the 1st, 2nd and 3rd parts of the duodenum, thought to be infectious or inflammatory duodenitis, the patient likely needs upper endoscopy, he does not have a fever or leukocytosis, no perforation, no abscess I agree with the radiologist interpretation   Cardiac Monitoring: / EKG:  The patient was maintained on a cardiac monitor.  I personally viewed and interpreted the cardiac monitored which showed an underlying rhythm of: Normal sinus rhythm   Problem List / ED Course / Critical interventions / Medication management  Patient was given Zosyn , he is felt okay to go home, minimal abdominal pain on exam, nonsurgical abdomen I ordered medication including Zosyn  Reevaluation of the patient after these medicines showed that the patient proved I have reviewed the patients home medicines and have made adjustments as needed   Consultations Obtained:  Outpatient referral to gastroenterology  Social Determinants of Health:  Considered admission but the patient has done well and is okay with discharge Elderly   Test / Admission - Considered:   I have discussed with the patient at the bedside the results, and the meaning of these results.  They have had opportunity to ask questions,  expressed their understanding to the need for follow-up with primary care physician  Home with Augmentin  and  Zofran  and Protonix       Final diagnoses:  None    ED Discharge Orders     None          Cleotilde Rogue, MD 06/20/24 2210

## 2024-06-26 DIAGNOSIS — K5649 Other impaction of intestine: Secondary | ICD-10-CM | POA: Diagnosis not present

## 2024-06-26 DIAGNOSIS — R338 Other retention of urine: Secondary | ICD-10-CM | POA: Diagnosis not present

## 2024-06-26 DIAGNOSIS — K567 Ileus, unspecified: Secondary | ICD-10-CM | POA: Diagnosis not present

## 2024-06-26 DIAGNOSIS — K9189 Other postprocedural complications and disorders of digestive system: Secondary | ICD-10-CM | POA: Diagnosis not present

## 2024-06-26 DIAGNOSIS — K259 Gastric ulcer, unspecified as acute or chronic, without hemorrhage or perforation: Secondary | ICD-10-CM | POA: Diagnosis not present

## 2024-06-26 DIAGNOSIS — K2980 Duodenitis without bleeding: Secondary | ICD-10-CM | POA: Diagnosis not present

## 2024-06-26 DIAGNOSIS — R109 Unspecified abdominal pain: Secondary | ICD-10-CM | POA: Diagnosis not present

## 2024-06-26 DIAGNOSIS — E876 Hypokalemia: Secondary | ICD-10-CM | POA: Diagnosis not present

## 2024-06-26 DIAGNOSIS — I700 Atherosclerosis of aorta: Secondary | ICD-10-CM | POA: Diagnosis not present

## 2024-06-26 DIAGNOSIS — I10 Essential (primary) hypertension: Secondary | ICD-10-CM | POA: Diagnosis not present

## 2024-06-26 DIAGNOSIS — N401 Enlarged prostate with lower urinary tract symptoms: Secondary | ICD-10-CM | POA: Diagnosis not present

## 2024-06-26 DIAGNOSIS — R001 Bradycardia, unspecified: Secondary | ICD-10-CM | POA: Diagnosis not present

## 2024-06-26 DIAGNOSIS — K6389 Other specified diseases of intestine: Secondary | ICD-10-CM | POA: Diagnosis not present

## 2024-06-26 DIAGNOSIS — K529 Noninfective gastroenteritis and colitis, unspecified: Secondary | ICD-10-CM | POA: Diagnosis not present

## 2024-06-26 DIAGNOSIS — E871 Hypo-osmolality and hyponatremia: Secondary | ICD-10-CM | POA: Diagnosis present

## 2024-06-26 DIAGNOSIS — Z8546 Personal history of malignant neoplasm of prostate: Secondary | ICD-10-CM | POA: Diagnosis not present

## 2024-06-26 DIAGNOSIS — K5289 Other specified noninfective gastroenteritis and colitis: Secondary | ICD-10-CM | POA: Diagnosis not present

## 2024-06-26 DIAGNOSIS — Z8711 Personal history of peptic ulcer disease: Secondary | ICD-10-CM | POA: Diagnosis not present

## 2024-06-26 DIAGNOSIS — K269 Duodenal ulcer, unspecified as acute or chronic, without hemorrhage or perforation: Secondary | ICD-10-CM | POA: Diagnosis not present

## 2024-06-26 DIAGNOSIS — Z8551 Personal history of malignant neoplasm of bladder: Secondary | ICD-10-CM | POA: Diagnosis not present

## 2024-06-26 DIAGNOSIS — R140 Abdominal distension (gaseous): Secondary | ICD-10-CM | POA: Diagnosis not present

## 2024-06-26 DIAGNOSIS — R112 Nausea with vomiting, unspecified: Secondary | ICD-10-CM | POA: Diagnosis not present

## 2024-06-26 DIAGNOSIS — K219 Gastro-esophageal reflux disease without esophagitis: Secondary | ICD-10-CM | POA: Diagnosis not present

## 2024-06-26 DIAGNOSIS — K315 Obstruction of duodenum: Secondary | ICD-10-CM | POA: Diagnosis not present

## 2024-06-26 DIAGNOSIS — D62 Acute posthemorrhagic anemia: Secondary | ICD-10-CM | POA: Diagnosis not present

## 2024-06-26 DIAGNOSIS — Z87891 Personal history of nicotine dependence: Secondary | ICD-10-CM | POA: Diagnosis not present

## 2024-06-26 DIAGNOSIS — R1084 Generalized abdominal pain: Secondary | ICD-10-CM | POA: Diagnosis not present

## 2024-06-26 DIAGNOSIS — R1013 Epigastric pain: Secondary | ICD-10-CM | POA: Diagnosis not present

## 2024-06-26 DIAGNOSIS — N281 Cyst of kidney, acquired: Secondary | ICD-10-CM | POA: Diagnosis not present

## 2024-06-26 DIAGNOSIS — Z4659 Encounter for fitting and adjustment of other gastrointestinal appliance and device: Secondary | ICD-10-CM | POA: Diagnosis not present

## 2024-06-26 DIAGNOSIS — K311 Adult hypertrophic pyloric stenosis: Secondary | ICD-10-CM | POA: Diagnosis not present

## 2024-06-26 DIAGNOSIS — K56699 Other intestinal obstruction unspecified as to partial versus complete obstruction: Secondary | ICD-10-CM | POA: Diagnosis not present

## 2024-06-26 DIAGNOSIS — K449 Diaphragmatic hernia without obstruction or gangrene: Secondary | ICD-10-CM | POA: Diagnosis not present

## 2024-06-26 DIAGNOSIS — K55029 Acute infarction of small intestine, extent unspecified: Secondary | ICD-10-CM | POA: Diagnosis not present

## 2024-06-26 DIAGNOSIS — Y848 Other medical procedures as the cause of abnormal reaction of the patient, or of later complication, without mention of misadventure at the time of the procedure: Secondary | ICD-10-CM | POA: Diagnosis not present

## 2024-06-26 DIAGNOSIS — K3189 Other diseases of stomach and duodenum: Secondary | ICD-10-CM | POA: Diagnosis not present

## 2024-06-27 DIAGNOSIS — K269 Duodenal ulcer, unspecified as acute or chronic, without hemorrhage or perforation: Secondary | ICD-10-CM | POA: Diagnosis not present

## 2024-06-27 DIAGNOSIS — K529 Noninfective gastroenteritis and colitis, unspecified: Secondary | ICD-10-CM | POA: Diagnosis not present

## 2024-06-27 DIAGNOSIS — K449 Diaphragmatic hernia without obstruction or gangrene: Secondary | ICD-10-CM | POA: Diagnosis not present

## 2024-06-27 DIAGNOSIS — K3189 Other diseases of stomach and duodenum: Secondary | ICD-10-CM | POA: Diagnosis not present

## 2024-06-27 DIAGNOSIS — K311 Adult hypertrophic pyloric stenosis: Secondary | ICD-10-CM | POA: Diagnosis present

## 2024-06-27 DIAGNOSIS — R109 Unspecified abdominal pain: Secondary | ICD-10-CM | POA: Diagnosis not present

## 2024-06-27 DIAGNOSIS — E871 Hypo-osmolality and hyponatremia: Secondary | ICD-10-CM | POA: Diagnosis not present

## 2024-06-27 DIAGNOSIS — N281 Cyst of kidney, acquired: Secondary | ICD-10-CM | POA: Diagnosis not present

## 2024-06-28 ENCOUNTER — Encounter: Payer: Self-pay | Admitting: Internal Medicine

## 2024-06-28 DIAGNOSIS — R109 Unspecified abdominal pain: Secondary | ICD-10-CM | POA: Diagnosis not present

## 2024-06-28 DIAGNOSIS — Z4659 Encounter for fitting and adjustment of other gastrointestinal appliance and device: Secondary | ICD-10-CM | POA: Diagnosis not present

## 2024-06-29 DIAGNOSIS — K259 Gastric ulcer, unspecified as acute or chronic, without hemorrhage or perforation: Secondary | ICD-10-CM | POA: Diagnosis not present

## 2024-06-29 DIAGNOSIS — K3189 Other diseases of stomach and duodenum: Secondary | ICD-10-CM | POA: Diagnosis not present

## 2024-06-29 DIAGNOSIS — K449 Diaphragmatic hernia without obstruction or gangrene: Secondary | ICD-10-CM | POA: Diagnosis not present

## 2024-06-29 DIAGNOSIS — R109 Unspecified abdominal pain: Secondary | ICD-10-CM | POA: Diagnosis not present

## 2024-06-29 DIAGNOSIS — R001 Bradycardia, unspecified: Secondary | ICD-10-CM | POA: Diagnosis not present

## 2024-06-30 DIAGNOSIS — R109 Unspecified abdominal pain: Secondary | ICD-10-CM | POA: Diagnosis not present

## 2024-07-01 DIAGNOSIS — E871 Hypo-osmolality and hyponatremia: Secondary | ICD-10-CM | POA: Diagnosis not present

## 2024-07-02 DIAGNOSIS — E871 Hypo-osmolality and hyponatremia: Secondary | ICD-10-CM | POA: Diagnosis not present

## 2024-07-02 DIAGNOSIS — R001 Bradycardia, unspecified: Secondary | ICD-10-CM | POA: Diagnosis not present

## 2024-07-20 DIAGNOSIS — Z980 Intestinal bypass and anastomosis status: Secondary | ICD-10-CM | POA: Diagnosis not present

## 2024-07-20 DIAGNOSIS — K3189 Other diseases of stomach and duodenum: Secondary | ICD-10-CM | POA: Diagnosis not present

## 2024-07-24 DIAGNOSIS — Z980 Intestinal bypass and anastomosis status: Secondary | ICD-10-CM

## 2024-08-03 DIAGNOSIS — K269 Duodenal ulcer, unspecified as acute or chronic, without hemorrhage or perforation: Secondary | ICD-10-CM | POA: Diagnosis not present

## 2024-08-03 DIAGNOSIS — K449 Diaphragmatic hernia without obstruction or gangrene: Secondary | ICD-10-CM | POA: Diagnosis not present

## 2024-08-03 DIAGNOSIS — K3189 Other diseases of stomach and duodenum: Secondary | ICD-10-CM | POA: Diagnosis not present

## 2024-08-03 DIAGNOSIS — K263 Acute duodenal ulcer without hemorrhage or perforation: Secondary | ICD-10-CM | POA: Diagnosis not present

## 2024-08-03 DIAGNOSIS — K2980 Duodenitis without bleeding: Secondary | ICD-10-CM | POA: Diagnosis not present

## 2024-08-09 DIAGNOSIS — L0889 Other specified local infections of the skin and subcutaneous tissue: Secondary | ICD-10-CM | POA: Diagnosis not present

## 2024-08-09 DIAGNOSIS — B029 Zoster without complications: Secondary | ICD-10-CM | POA: Diagnosis not present

## 2024-08-17 DIAGNOSIS — Z9889 Other specified postprocedural states: Secondary | ICD-10-CM | POA: Diagnosis not present

## 2024-08-17 DIAGNOSIS — K3189 Other diseases of stomach and duodenum: Secondary | ICD-10-CM | POA: Diagnosis not present

## 2024-08-29 ENCOUNTER — Emergency Department (HOSPITAL_COMMUNITY)
Admission: EM | Admit: 2024-08-29 | Discharge: 2024-08-29 | Disposition: A | Discharge status: Home / Self Care | Attending: Emergency Medicine | Admitting: Emergency Medicine

## 2024-08-29 ENCOUNTER — Encounter (HOSPITAL_COMMUNITY): Payer: Self-pay

## 2024-08-29 ENCOUNTER — Other Ambulatory Visit: Payer: Self-pay

## 2024-08-29 ENCOUNTER — Emergency Department (HOSPITAL_COMMUNITY)

## 2024-08-29 DIAGNOSIS — R410 Disorientation, unspecified: Secondary | ICD-10-CM | POA: Diagnosis not present

## 2024-08-29 DIAGNOSIS — E871 Hypo-osmolality and hyponatremia: Secondary | ICD-10-CM | POA: Diagnosis not present

## 2024-08-29 DIAGNOSIS — I10 Essential (primary) hypertension: Secondary | ICD-10-CM | POA: Diagnosis not present

## 2024-08-29 DIAGNOSIS — R5383 Other fatigue: Secondary | ICD-10-CM | POA: Diagnosis not present

## 2024-08-29 DIAGNOSIS — R531 Weakness: Secondary | ICD-10-CM | POA: Insufficient documentation

## 2024-08-29 DIAGNOSIS — Z8546 Personal history of malignant neoplasm of prostate: Secondary | ICD-10-CM | POA: Diagnosis not present

## 2024-08-29 DIAGNOSIS — R4182 Altered mental status, unspecified: Secondary | ICD-10-CM | POA: Diagnosis not present

## 2024-08-29 DIAGNOSIS — R001 Bradycardia, unspecified: Secondary | ICD-10-CM | POA: Diagnosis not present

## 2024-08-29 HISTORY — DX: Zoster without complications: B02.9

## 2024-08-29 LAB — URINALYSIS, COMPLETE (UACMP) WITH MICROSCOPIC
Bacteria, UA: NONE SEEN
Bilirubin Urine: NEGATIVE
Glucose, UA: NEGATIVE mg/dL
Hgb urine dipstick: NEGATIVE
Ketones, ur: NEGATIVE mg/dL
Leukocytes,Ua: NEGATIVE
Nitrite: NEGATIVE
Protein, ur: NEGATIVE mg/dL
Specific Gravity, Urine: 1.023 (ref 1.005–1.030)
pH: 5 (ref 5.0–8.0)

## 2024-08-29 LAB — URINALYSIS, ROUTINE W REFLEX MICROSCOPIC
Bilirubin Urine: NEGATIVE
Glucose, UA: NEGATIVE mg/dL
Hgb urine dipstick: NEGATIVE
Ketones, ur: NEGATIVE mg/dL
Leukocytes,Ua: NEGATIVE
Nitrite: NEGATIVE
Protein, ur: NEGATIVE mg/dL
Specific Gravity, Urine: 1.021 (ref 1.005–1.030)
pH: 5 (ref 5.0–8.0)

## 2024-08-29 LAB — CBC WITH DIFFERENTIAL/PLATELET
Abs Immature Granulocytes: 0.02 K/uL (ref 0.00–0.07)
Basophils Absolute: 0 K/uL (ref 0.0–0.1)
Basophils Relative: 0 %
Eosinophils Absolute: 0.3 K/uL (ref 0.0–0.5)
Eosinophils Relative: 5 %
HCT: 39.4 % (ref 39.0–52.0)
Hemoglobin: 13.8 g/dL (ref 13.0–17.0)
Immature Granulocytes: 0 %
Lymphocytes Relative: 18 %
Lymphs Abs: 1.2 K/uL (ref 0.7–4.0)
MCH: 30.5 pg (ref 26.0–34.0)
MCHC: 35 g/dL (ref 30.0–36.0)
MCV: 87.2 fL (ref 80.0–100.0)
Monocytes Absolute: 0.8 K/uL (ref 0.1–1.0)
Monocytes Relative: 13 %
Neutro Abs: 4 K/uL (ref 1.7–7.7)
Neutrophils Relative %: 64 %
Platelets: 304 K/uL (ref 150–400)
RBC: 4.52 MIL/uL (ref 4.22–5.81)
RDW: 15.8 % — ABNORMAL HIGH (ref 11.5–15.5)
WBC: 6.4 K/uL (ref 4.0–10.5)
nRBC: 0 % (ref 0.0–0.2)

## 2024-08-29 LAB — COMPREHENSIVE METABOLIC PANEL WITH GFR
ALT: 12 U/L (ref 0–44)
AST: 14 U/L — ABNORMAL LOW (ref 15–41)
Albumin: 4.1 g/dL (ref 3.5–5.0)
Alkaline Phosphatase: 78 U/L (ref 38–126)
Anion gap: 12 (ref 5–15)
BUN: 20 mg/dL (ref 8–23)
CO2: 27 mmol/L (ref 22–32)
Calcium: 9 mg/dL (ref 8.9–10.3)
Chloride: 87 mmol/L — ABNORMAL LOW (ref 98–111)
Creatinine, Ser: 0.83 mg/dL (ref 0.61–1.24)
GFR, Estimated: >60 mL/min (ref >60)
Glucose, Bld: 107 mg/dL — ABNORMAL HIGH (ref 70–99)
Potassium: 3.8 mmol/L (ref 3.5–5.1)
Sodium: 126 mmol/L — ABNORMAL LOW (ref 135–145)
Total Bilirubin: 1.1 mg/dL (ref 0.0–1.2)
Total Protein: 6.3 g/dL — ABNORMAL LOW (ref 6.5–8.1)

## 2024-08-29 LAB — MAGNESIUM: Magnesium: 2.1 mg/dL (ref 1.7–2.4)

## 2024-08-29 MED ORDER — SODIUM CHLORIDE 0.9 % IV BOLUS
1000.0000 mL | Freq: Once | INTRAVENOUS | Status: AC
  Administered 2024-08-29: 1000 mL via INTRAVENOUS

## 2024-08-29 NOTE — ED Notes (Signed)
 ED Provider at bedside.

## 2024-08-29 NOTE — Discharge Instructions (Signed)
 Your lab tests, imaging and your exam is reassuring today.  Your sodium level is low today but it appears to have been for the past several months, so this is not likely the source of your symptoms this week.  You have received some sodium here in the IV fluids we gave you.

## 2024-08-29 NOTE — ED Notes (Signed)
 Patient transported to CT via stretcher.

## 2024-08-29 NOTE — ED Triage Notes (Signed)
 Pt arrived via POV from home with his daughter [name] who brings the Pt to [facility] for evaluation of worsening confusion, AMS, weakness, fatigue and reports symptoms began shortly after getting over a Shingles infection. Family report the Pts Shingles have now all dried up.

## 2024-08-31 DIAGNOSIS — K3189 Other diseases of stomach and duodenum: Secondary | ICD-10-CM | POA: Diagnosis not present

## 2024-08-31 NOTE — ED Provider Notes (Signed)
 [place] [facility]ERGENC[facility] DEPARTMENT AT [facility] Provider Note   CSN: [id number] Arrival date & time: 08/29/24  1509     Patient presents with: Altered Mental Status   Noah Burnett is a 83 y.o. male with a history including hypertension, history of prostate cancer, GERD, recent shingles flare across to his left forehead and scalp who is also being worked up for a duodenal mass at [facility], family and outside records indicate he has had 2 biopsies both benign but he is being scheduled for third as they suspect this is a cancer, presenting for generalized weakness and episodes of confusion per children at bedside.  They describe 2 distinct episodes this week, on Sunday he walked out to the mailbox to get his mail not realizing it was Sunday and then yesterday he thought it was Sunday and got upset when he could not find football games on TV.  He does have continued discomfort at the site of his shingles which is helped by Tylenol .  He endorses sleeping well but has had poor p.o. intake since July when this duodenal mass was originally diagnosed.  Family states he has had poor p.o. intake and is not hydrating either and is concerned about being dehydrated.  He denies nausea or vomiting, simply states he has no appetite.  He denies abdominal pain, also denies chest pain or shortness of breath.  Noted upon first arrival he is bradycardic, he and family confirmed that this is his baseline.  He denies dizziness with standing, shortness of breath, palpitations.   The history is provided by the patient and a relative (son and daughter at bedside).       Prior to Admission medications   Medication Sig Start Date End Date Taking? Authorizing Provider  acetaminophen  (TYLENOL ) 500 MG tablet Take 1,000 mg by mouth every 6 (six) hours as needed for headache or mild pain (pain score 1-3).   Yes [provider]  bisacodyl (DULCOLAX) 5 MG EC tablet Take 5 mg by mouth daily as needed for  moderate constipation.   Yes [provider]  hydrochlorothiazide (HYDRODIURIL) 12.5 MG tablet Take 12.5 mg by mouth daily. 02/16/23  Yes [provider]  magnesium hydroxide (MILK OF MAGNESIA) 400 MG/5ML suspension Take 15 mLs by mouth daily as needed for mild constipation.   Yes [provider]  omeprazole (PRILOSEC) 40 MG capsule Take 40 mg by mouth daily. 02/16/23  Yes [provider]  valACYclovir (VALTREX) 1000 MG tablet Take 1,000 mg by mouth 3 (three) times daily. Patient not taking: Reported on 08/29/2024 08/09/24   [provider]    Allergies: Haemophilus influenzae vaccines    Review of Systems  Constitutional:  Positive for activity change, appetite change and fatigue. Negative for fever.  HENT:  Negative for congestion and sore throat.   Eyes: Negative.   Respiratory:  Negative for chest tightness and shortness of breath.   Cardiovascular:  Negative for chest pain.  Gastrointestinal:  Negative for abdominal pain, nausea and vomiting.  Genitourinary: Negative.   Musculoskeletal:  Negative for arthralgias, joint swelling and neck pain.  Skin: Negative.  Negative for rash and wound.  Neurological:  Positive for weakness. Negative for dizziness, light-headedness, numbness and headaches.  Psychiatric/Behavioral: Negative.      Updated Vital Signs BP (!) 155/70   Pulse (!) 46   Temp 98 F (36.7 C)   Resp 16   Ht 5' 7 (1.702 m)   Wt 51.3 kg   SpO2  100%   BMI 17.70 kg/m   Physical Exam Vitals and nursing note reviewed.  Constitutional:      Appearance: Normal appearance. He is well-developed.     Comments: Patient is awake and oriented x 3 during his ED stay.  He does look fatigued.  HENT:     Head: Normocephalic and atraumatic.     Comments: Scaly, dry appearing skin left forehead and into his scalp at location from his recent shingles, no scabs or other lesions still present.    Mouth/Throat:     Mouth: Mucous membranes are  moist.  Eyes:     Conjunctiva/sclera: Conjunctivae normal.  Cardiovascular:     Rate and Rhythm: Regular rhythm. Bradycardia present.     Heart sounds: Normal heart sounds.  Pulmonary:     Effort: Pulmonary effort is normal.     Breath sounds: Normal breath sounds. No wheezing.  Abdominal:     General: Bowel sounds are normal.     Palpations: Abdomen is soft.     Tenderness: There is no abdominal tenderness.  Musculoskeletal:        General: Normal range of motion.     Cervical back: Normal range of motion.  Skin:    General: Skin is warm and dry.  Neurological:     Mental Status: He is alert.     (all labs ordered are listed, but only abnormal results are displayed) Labs Reviewed  URINALYSIS, ROUTINE W REFLEX MICROSCOPIC - Abnormal; Notable for the following components:      Result Value   Color, Urine AMBER (*)    APPearance HAZY (*)    All other components within normal limits  CBC WITH DIFFERENTIAL/PLATELET - Abnormal; Notable for the following components:   RDW 15.8 (*)    All other components within normal limits  COMPREHENSIVE METABOLIC PANEL WITH GFR - Abnormal; Notable for the following components:   Sodium 126 (*)    Chloride 87 (*)    Glucose, Bld 107 (*)    Total Protein 6.3 (*)    AST 14 (*)    All other components within normal limits  URINALYSIS, COMPLETE (UACMP) WITH MICROSCOPIC - Abnormal; Notable for the following components:   Color, Urine AMBER (*)    APPearance HAZY (*)    All other components within normal limits  MAGNESIUM    EKG: None  Radiology: CT Head Wo Contrast Result Date: 08/29/2024 EXAM: CT HEAD WITHOUT CONTRAST 08/29/2024 07:59:42 PM TECHNIQUE: CT of the head was performed without the administration of intravenous contrast. Automated exposure control, iterative reconstruction, and/or weight based adjustment of the mA/kV was utilized to reduce the radiation dose to as low as reasonably achievable. COMPARISON: 03/27/2018 CLINICAL  HISTORY: Mental status change, unknown cause. Worsening confusion, AMS, weakness, and fatigue, reportedly beginning after a Shingles infection. FINDINGS: BRAIN AND VENTRICLES: No acute hemorrhage. No evidence of acute infarct. No hydrocephalus. No extra-axial collection. No mass effect or midline shift. Age-related cerebral volume loss and mild periventricular and deep cerebral white matter disease. Moderate calcific atheromatous disease within the carotid siphons. ORBITS: No acute abnormality. Bilateral lens replacement noted. SINUSES: No acute abnormality. SOFT TISSUES AND SKULL: No acute soft tissue abnormality. No skull fracture. IMPRESSION: 1. No acute intracranial abnormality. Electronically signed by: Norman Gatlin MD 08/29/2024 08:05 PM EDT RP Workstation: [id number]     Procedures   Medications Ordered in the ED  sodium chloride  0.9 % bolus 1,000 mL (0 mLs Intravenous Stopped 08/29/24 2236)  Medical Decision Making Patient presenting with complaint of generalized fatigue and weakness, slowly worsening condition since he was diagnosed with duodenal mass in July.  According to family at bedside before July he was mowing his lawn very active, now he is very sedentary with no interest in activities including loss of appetite.  Although the endorse several episodes where he was confused to the day of the week does not have any confusion here.  Differential diagnosis would include electrolyte abnormality, dehydration, infection, deconditioning.  Imaging and labs as outlined below.  He does have hyponatremia at 126 today, however review of his labs from [facility] suggest this is chronic and not an acutely new condition, favoring this is not the source of his weakness.  However he was given normal saline 1 L during his ED stay which should have helped to improve this number.  We discussed pros and cons of admission versus follow-up with his provider at [facility] which he  is scheduled to see in 2 days.  He was offered admission but he prefers to go home so that he does not have to reschedule his appointment at [facility].  Family agrees with his decision and I think this is reasonable.  Amount and/or Complexity of Data Reviewed Labs: ordered.    Details: Labs were reviewed including urinalysis, no infection, magnesium normal at 2.1, potassium normal at 3.8, his total protein is 6.3, sodium of 126, this was 136 weeks ago. Radiology: ordered.    Details: CT head is negative for acute intracranial abnormalities. Discussion of management or test interpretation with external provider(s): EKG was not completed today, however review of recent EKGs with [facility] confirming rates in the 48-51 range.  Risk Decision regarding hospitalization.        Final diagnoses:  Chronic hyponatremia  Weakness    ED Discharge Orders     None          Birdena Mliss RIGGERS 08/31/24 1346    Towana Ozell BROCKS, MD 09/01/24 [phone]

## 2024-09-04 DIAGNOSIS — K219 Gastro-esophageal reflux disease without esophagitis: Secondary | ICD-10-CM | POA: Diagnosis not present

## 2024-09-04 DIAGNOSIS — K3189 Other diseases of stomach and duodenum: Secondary | ICD-10-CM | POA: Diagnosis not present

## 2024-09-04 DIAGNOSIS — I700 Atherosclerosis of aorta: Secondary | ICD-10-CM | POA: Diagnosis not present

## 2024-09-04 DIAGNOSIS — R531 Weakness: Secondary | ICD-10-CM | POA: Diagnosis not present

## 2024-09-04 DIAGNOSIS — I10 Essential (primary) hypertension: Secondary | ICD-10-CM | POA: Diagnosis not present

## 2024-09-04 DIAGNOSIS — Z8619 Personal history of other infectious and parasitic diseases: Secondary | ICD-10-CM | POA: Diagnosis not present

## 2024-09-04 DIAGNOSIS — E871 Hypo-osmolality and hyponatremia: Secondary | ICD-10-CM | POA: Diagnosis not present

## 2024-09-04 DIAGNOSIS — K5900 Constipation, unspecified: Secondary | ICD-10-CM | POA: Diagnosis not present

## 2024-09-04 DIAGNOSIS — E43 Unspecified severe protein-calorie malnutrition: Secondary | ICD-10-CM | POA: Diagnosis not present

## 2024-09-04 NOTE — Progress Notes (Signed)
 Surgery Follow-Up Visit  Reason for Visit:  1. Duodenal mass      Problem List[1]   Subjective Noah Burnett. is a 83 y.o. male with past medical history of prostate cancer (currently observed), tobacco abuse, HTN, and GERD who presents to the surgical oncology clinic for follow-up of gastric outlet obstruction due to duodenal mass concerning for malignancy.  He is now s/p 07/03/24 robotic gastrojejunostomy, performed during 8/18 - 8/31 admission.    Noah Burnett was expereincing intermittent abdominal pain and nausea with emesis for one month and ultimately presented to the hospital with progressive symptoms.  He underwent a CT scan that was concerning for a mass involving the second part of the duodenum/periampullary mass with potential involvement of the pancreatic head.  He then underwent an EGD that showed a hiatal hernia and extrinsic compression of the second portion of the duodenum with possible intraluminal neoplasm visualized.  Multiple biopsies were obtained with pathology resulting a severely inflamed and ulcerated duodenal mucosa without evidence of malignancy.   There remained a high suspicion for malignancy and he underwent an EGD with EUS guided biopsy on 06/29/24.  Pathology of the duodenal lesion resulted extensive necrosis. Surgical Oncology consulted and he underwent a robotic gastrojejunostomy on 8/25.  Return of bowel function was delayed and he underwent PICC placement and TPN administered for several days until diet could be advanced.  He was discharged on 8/31.    08/31/2024: He returns to clinic for follow-up. He is not doing very well. Patient reports worsening weakness and fatigue. Denies abdominal pain, nausea, emesis and tolerates diet but has low appetite and has lost some weight since last time I saw him. He is not drinking the protein shakes. His level of energy has progressively been declining and he is using a walker to ambulate. His ambulation is limited and  just to the house.    Home: [place] [id number]        Objective  BP (!) 101/52 (BP Location: Left arm, Patient Position: Sitting)   Pulse 67   Temp 97 F (36.1 C) (Temporal)   Ht 1.676 m (5' 6)   Wt 48.2 kg (106 lb 4.8 oz)   SpO2 93%   BMI 17.16 kg/m   Physical Exam Vitals reviewed.  Constitutional:      General: He is not in acute distress.    Appearance: Normal appearance. He is not ill-appearing.  HENT:     Head: Normocephalic and atraumatic.  Eyes:     General: No scleral icterus. Abdominal:     General: Abdomen is flat. There is no distension.     Palpations: Abdomen is soft. There is no mass.     Tenderness: There is no abdominal tenderness. There is no guarding or rebound.     Comments: Lap incisions healing well, glue in place   Skin:    General: Skin is warm and dry.     Coloration: Skin is not jaundiced.  Neurological:     General: No focal deficit present.     Mental Status: He is alert and oriented to person, place, and time.       Laboratory Studies Lab Results  Component Value Date   WBC 6.00 08/31/2024   HGB 13.6 (L) 08/31/2024   HCT 39.2 (L) 08/31/2024   PLT 287 08/31/2024   NA 126 (L) 08/31/2024   K 3.6 08/31/2024   CREATININE 0.74 08/31/2024   BUN 18 08/31/2024   ALBUMIN 3.6 08/31/2024  BILITOT 0.9 08/31/2024   ALT 8 08/31/2024   AST 10 (L) 08/31/2024   CEA 1.6 06/27/2024   CA199 24 08/03/2024     Additional Studies 8/21 EUS FNA  Final Diagnosis  A.  DUODENAL LESION, CORE BIOPSY WITH TOUCH PREP:              Extensive necrosis.              See comment.   8/21 EGD  Final Diagnosis  A. Small intestine, duodenum, biopsy: Acute ulcerative duodenitis.  No dysplasia or carcinoma is seen. Immunohistochemistry for CMV and H. pylori was performed to assess the etiology of the injury and the result is NEGATIVE.     8/19 Duodenum bx  Final Diagnosis  A.  Duodenal mass, biopsies: Fragments of severely inflamed and ulcerated  duodenal mucosa. No evidence of carcinoma or any neoplasm identified.    Assessment/Plan Noah Milian Mykael Batz. is a 83 y.o. male with past medical history of prostate cancer (currently observed), tobacco abuse, HTN, and GERD who presents to the surgical oncology clinic for follow-up of gastric outlet obstruction due to duodenal mass concerning for malignancy.  He is now s/p 07/03/24 robotic gastrojejunostomy, performed during 8/18 - 8/31 admission.    He is not doing very well. Patient reports worsening weakness and fatigue. Denies abdominal pain, nausea, emesis and tolerates diet but has low appetite and has lost some weight since last time I saw him. He is not drinking the protein shakes. His level of energy has progressively been declining and he is using a walker to ambulate. His ambulation is limited and just to the house.   We discussed again that although all so far biopsies indicate ulcerative duodenitis with some necrosis, our suspicion based on previous imaging and endoscopic findings appearance is high for malignant duodenal mass and we need to ensure malignant etiology is absolutely ruled out. I recommended to focus on nutrition and protein uptake and continue ambulating to try to improve functional status. I have ordered a new MRI abdomen/pelvis and new PET CT to assess evolution of duodenal lesion/findings. We discussed, if there is improvement in the imaging findings that would indicate duodenal lesion likely not malignant. If there is progression and significant lymphadenopathy we will attempt re-biopsy, endoscopic vs IR biopsy of target lymph nodes to obtain diagnosis. Patient appeared to understand and agreed with this plan. All questions were answered to patient's satisfaction. I am planning to see this patient again in 3-4 weeks after completion of this workup to discuss next steps. Patient appeared to understand and agreed with this plan. All questions were answered to patient's  satisfaction.   All questions answered and he and his children are in agreement with plan.   No problem-specific Assessment & Plan notes found for this encounter.   Orders Placed This Encounter  Procedures  . MRI Abdomen And Pelvis W And WO Contrast  . PET CT ANY SITE NOT SPECIFIED  . CBC without Differential  . Comprehensive Metabolic Panel  . CBC without Differential     Follow-up Plan Return in about 3 weeks (around 09/21/2024) for with MRI, with PET, to see Dr Bertrand in 3 weeks .   Future Appointments  Date Time Provider Department Center  09/06/2024  5:30 PM [facility] MRI1 [facility]  09/14/2024  4:00 PM [facility] PETINJ1 [facility] PET RT [facility]  09/14/2024  5:00 PM [facility] PET1 [facility] PET RT [facility]  10/12/2024  3:45  PM Eleftherios Bertrand, MD [id number]             [1] Patient Active Problem List Diagnosis  . Hyponatremia  . Abdominal pain  . Nausea and vomiting  . Enteritis  . Gastric outlet obstruction (CMD)  . Duodenal mass  . H/O bypass gastrojejunostomy

## 2024-09-06 DIAGNOSIS — K3189 Other diseases of stomach and duodenum: Secondary | ICD-10-CM | POA: Diagnosis not present

## 2024-09-06 DIAGNOSIS — N281 Cyst of kidney, acquired: Secondary | ICD-10-CM | POA: Diagnosis not present

## 2024-09-06 DIAGNOSIS — K7689 Other specified diseases of liver: Secondary | ICD-10-CM | POA: Diagnosis not present

## 2024-09-06 DIAGNOSIS — K769 Liver disease, unspecified: Secondary | ICD-10-CM | POA: Diagnosis not present

## 2024-09-06 DIAGNOSIS — R601 Generalized edema: Secondary | ICD-10-CM | POA: Diagnosis not present

## 2024-09-08 ENCOUNTER — Other Ambulatory Visit: Payer: Self-pay

## 2024-09-08 ENCOUNTER — Encounter (HOSPITAL_COMMUNITY): Payer: Self-pay

## 2024-09-08 ENCOUNTER — Emergency Department (HOSPITAL_COMMUNITY)

## 2024-09-08 ENCOUNTER — Inpatient Hospital Stay (HOSPITAL_COMMUNITY)
Admission: EM | Admit: 2024-09-08 | Discharge: 2024-09-21 | DRG: 073 | Disposition: A | Discharge status: Rehabilitation Facility | Attending: Family Medicine | Admitting: Family Medicine

## 2024-09-08 DIAGNOSIS — R64 Cachexia: Secondary | ICD-10-CM | POA: Diagnosis not present

## 2024-09-08 DIAGNOSIS — K319 Disease of stomach and duodenum, unspecified: Secondary | ICD-10-CM | POA: Diagnosis not present

## 2024-09-08 DIAGNOSIS — J9601 Acute respiratory failure with hypoxia: Secondary | ICD-10-CM | POA: Diagnosis not present

## 2024-09-08 DIAGNOSIS — E222 Syndrome of inappropriate secretion of antidiuretic hormone: Secondary | ICD-10-CM | POA: Diagnosis not present

## 2024-09-08 DIAGNOSIS — N139 Obstructive and reflux uropathy, unspecified: Secondary | ICD-10-CM | POA: Diagnosis present

## 2024-09-08 DIAGNOSIS — R1310 Dysphagia, unspecified: Secondary | ICD-10-CM | POA: Diagnosis not present

## 2024-09-08 DIAGNOSIS — R7303 Prediabetes: Secondary | ICD-10-CM | POA: Diagnosis not present

## 2024-09-08 DIAGNOSIS — R5381 Other malaise: Secondary | ICD-10-CM | POA: Diagnosis not present

## 2024-09-08 DIAGNOSIS — I609 Nontraumatic subarachnoid hemorrhage, unspecified: Secondary | ICD-10-CM | POA: Diagnosis not present

## 2024-09-08 DIAGNOSIS — B0229 Other postherpetic nervous system involvement: Secondary | ICD-10-CM | POA: Diagnosis not present

## 2024-09-08 DIAGNOSIS — G9341 Metabolic encephalopathy: Secondary | ICD-10-CM | POA: Diagnosis present

## 2024-09-08 DIAGNOSIS — I7789 Other specified disorders of arteries and arterioles: Secondary | ICD-10-CM | POA: Diagnosis not present

## 2024-09-08 DIAGNOSIS — Z8719 Personal history of other diseases of the digestive system: Secondary | ICD-10-CM

## 2024-09-08 DIAGNOSIS — E871 Hypo-osmolality and hyponatremia: Secondary | ICD-10-CM | POA: Diagnosis not present

## 2024-09-08 DIAGNOSIS — N401 Enlarged prostate with lower urinary tract symptoms: Secondary | ICD-10-CM | POA: Diagnosis not present

## 2024-09-08 DIAGNOSIS — B0189 Other varicella complications: Secondary | ICD-10-CM | POA: Diagnosis present

## 2024-09-08 DIAGNOSIS — Z538 Procedure and treatment not carried out for other reasons: Secondary | ICD-10-CM | POA: Diagnosis present

## 2024-09-08 DIAGNOSIS — R197 Diarrhea, unspecified: Secondary | ICD-10-CM | POA: Diagnosis not present

## 2024-09-08 DIAGNOSIS — R54 Age-related physical debility: Secondary | ICD-10-CM | POA: Diagnosis present

## 2024-09-08 DIAGNOSIS — I69022 Dysarthria following nontraumatic subarachnoid hemorrhage: Secondary | ICD-10-CM | POA: Diagnosis not present

## 2024-09-08 DIAGNOSIS — Z9884 Bariatric surgery status: Secondary | ICD-10-CM | POA: Diagnosis not present

## 2024-09-08 DIAGNOSIS — R627 Adult failure to thrive: Secondary | ICD-10-CM | POA: Diagnosis not present

## 2024-09-08 DIAGNOSIS — G9340 Encephalopathy, unspecified: Secondary | ICD-10-CM | POA: Diagnosis not present

## 2024-09-08 DIAGNOSIS — Z743 Need for continuous supervision: Secondary | ICD-10-CM | POA: Diagnosis not present

## 2024-09-08 DIAGNOSIS — R140 Abdominal distension (gaseous): Secondary | ICD-10-CM | POA: Diagnosis not present

## 2024-09-08 DIAGNOSIS — R059 Cough, unspecified: Secondary | ICD-10-CM | POA: Diagnosis not present

## 2024-09-08 DIAGNOSIS — Z87891 Personal history of nicotine dependence: Secondary | ICD-10-CM | POA: Diagnosis not present

## 2024-09-08 DIAGNOSIS — I69091 Dysphagia following nontraumatic subarachnoid hemorrhage: Secondary | ICD-10-CM | POA: Diagnosis not present

## 2024-09-08 DIAGNOSIS — N179 Acute kidney failure, unspecified: Secondary | ICD-10-CM | POA: Diagnosis not present

## 2024-09-08 DIAGNOSIS — I6381 Other cerebral infarction due to occlusion or stenosis of small artery: Secondary | ICD-10-CM | POA: Diagnosis present

## 2024-09-08 DIAGNOSIS — K269 Duodenal ulcer, unspecified as acute or chronic, without hemorrhage or perforation: Secondary | ICD-10-CM | POA: Diagnosis present

## 2024-09-08 DIAGNOSIS — R4182 Altered mental status, unspecified: Secondary | ICD-10-CM | POA: Diagnosis not present

## 2024-09-08 DIAGNOSIS — K219 Gastro-esophageal reflux disease without esophagitis: Secondary | ICD-10-CM | POA: Diagnosis not present

## 2024-09-08 DIAGNOSIS — E785 Hyperlipidemia, unspecified: Secondary | ICD-10-CM | POA: Diagnosis not present

## 2024-09-08 DIAGNOSIS — I69098 Other sequelae following nontraumatic subarachnoid hemorrhage: Secondary | ICD-10-CM | POA: Diagnosis not present

## 2024-09-08 DIAGNOSIS — T508X5A Adverse effect of diagnostic agents, initial encounter: Secondary | ICD-10-CM | POA: Diagnosis not present

## 2024-09-08 DIAGNOSIS — Z8546 Personal history of malignant neoplasm of prostate: Secondary | ICD-10-CM | POA: Diagnosis not present

## 2024-09-08 DIAGNOSIS — Z681 Body mass index (BMI) 19 or less, adult: Secondary | ICD-10-CM | POA: Diagnosis not present

## 2024-09-08 DIAGNOSIS — R410 Disorientation, unspecified: Secondary | ICD-10-CM | POA: Diagnosis not present

## 2024-09-08 DIAGNOSIS — E860 Dehydration: Secondary | ICD-10-CM | POA: Diagnosis not present

## 2024-09-08 DIAGNOSIS — B020 Zoster encephalitis: Principal | ICD-10-CM | POA: Diagnosis present

## 2024-09-08 DIAGNOSIS — Z980 Intestinal bypass and anastomosis status: Secondary | ICD-10-CM

## 2024-09-08 DIAGNOSIS — Z4682 Encounter for fitting and adjustment of non-vascular catheter: Secondary | ICD-10-CM | POA: Diagnosis not present

## 2024-09-08 DIAGNOSIS — B0111 Varicella encephalitis and encephalomyelitis: Secondary | ICD-10-CM | POA: Diagnosis not present

## 2024-09-08 DIAGNOSIS — G9389 Other specified disorders of brain: Secondary | ICD-10-CM | POA: Diagnosis present

## 2024-09-08 DIAGNOSIS — E43 Unspecified severe protein-calorie malnutrition: Secondary | ICD-10-CM | POA: Diagnosis present

## 2024-09-08 DIAGNOSIS — Z7189 Other specified counseling: Secondary | ICD-10-CM | POA: Diagnosis not present

## 2024-09-08 DIAGNOSIS — Z79899 Other long term (current) drug therapy: Secondary | ICD-10-CM | POA: Diagnosis not present

## 2024-09-08 DIAGNOSIS — G0490 Encephalitis and encephalomyelitis, unspecified: Secondary | ICD-10-CM | POA: Diagnosis not present

## 2024-09-08 DIAGNOSIS — K559 Vascular disorder of intestine, unspecified: Secondary | ICD-10-CM | POA: Diagnosis not present

## 2024-09-08 DIAGNOSIS — E876 Hypokalemia: Secondary | ICD-10-CM | POA: Diagnosis not present

## 2024-09-08 DIAGNOSIS — R338 Other retention of urine: Secondary | ICD-10-CM | POA: Diagnosis not present

## 2024-09-08 DIAGNOSIS — R29707 NIHSS score 7: Secondary | ICD-10-CM | POA: Diagnosis not present

## 2024-09-08 DIAGNOSIS — Z974 Presence of external hearing-aid: Secondary | ICD-10-CM

## 2024-09-08 DIAGNOSIS — R0989 Other specified symptoms and signs involving the circulatory and respiratory systems: Secondary | ICD-10-CM | POA: Diagnosis not present

## 2024-09-08 DIAGNOSIS — H9192 Unspecified hearing loss, left ear: Secondary | ICD-10-CM | POA: Diagnosis present

## 2024-09-08 DIAGNOSIS — R29810 Facial weakness: Secondary | ICD-10-CM | POA: Diagnosis not present

## 2024-09-08 DIAGNOSIS — I10 Essential (primary) hypertension: Secondary | ICD-10-CM | POA: Diagnosis not present

## 2024-09-08 DIAGNOSIS — B028 Zoster with other complications: Secondary | ICD-10-CM | POA: Diagnosis not present

## 2024-09-08 DIAGNOSIS — I69019 Unspecified symptoms and signs involving cognitive functions following nontraumatic subarachnoid hemorrhage: Secondary | ICD-10-CM | POA: Diagnosis not present

## 2024-09-08 DIAGNOSIS — I682 Cerebral arteritis in other diseases classified elsewhere: Secondary | ICD-10-CM | POA: Diagnosis not present

## 2024-09-08 DIAGNOSIS — K567 Ileus, unspecified: Secondary | ICD-10-CM | POA: Diagnosis not present

## 2024-09-08 DIAGNOSIS — Z515 Encounter for palliative care: Secondary | ICD-10-CM

## 2024-09-08 DIAGNOSIS — I69034 Monoplegia of upper limb following nontraumatic subarachnoid hemorrhage affecting left non-dominant side: Secondary | ICD-10-CM | POA: Diagnosis not present

## 2024-09-08 DIAGNOSIS — A86 Unspecified viral encephalitis: Secondary | ICD-10-CM | POA: Diagnosis not present

## 2024-09-08 DIAGNOSIS — K311 Adult hypertrophic pyloric stenosis: Secondary | ICD-10-CM | POA: Diagnosis not present

## 2024-09-08 DIAGNOSIS — I351 Nonrheumatic aortic (valve) insufficiency: Secondary | ICD-10-CM | POA: Diagnosis not present

## 2024-09-08 DIAGNOSIS — R001 Bradycardia, unspecified: Secondary | ICD-10-CM | POA: Diagnosis not present

## 2024-09-08 DIAGNOSIS — R414 Neurologic neglect syndrome: Secondary | ICD-10-CM | POA: Diagnosis not present

## 2024-09-08 DIAGNOSIS — R471 Dysarthria and anarthria: Secondary | ICD-10-CM | POA: Diagnosis not present

## 2024-09-08 DIAGNOSIS — I776 Arteritis, unspecified: Secondary | ICD-10-CM | POA: Diagnosis not present

## 2024-09-08 DIAGNOSIS — I700 Atherosclerosis of aorta: Secondary | ICD-10-CM | POA: Diagnosis not present

## 2024-09-08 DIAGNOSIS — K469 Unspecified abdominal hernia without obstruction or gangrene: Secondary | ICD-10-CM | POA: Diagnosis not present

## 2024-09-08 DIAGNOSIS — I639 Cerebral infarction, unspecified: Secondary | ICD-10-CM | POA: Diagnosis not present

## 2024-09-08 DIAGNOSIS — R569 Unspecified convulsions: Secondary | ICD-10-CM | POA: Diagnosis not present

## 2024-09-08 DIAGNOSIS — R198 Other specified symptoms and signs involving the digestive system and abdomen: Secondary | ICD-10-CM | POA: Diagnosis not present

## 2024-09-08 DIAGNOSIS — M318 Other specified necrotizing vasculopathies: Secondary | ICD-10-CM | POA: Diagnosis not present

## 2024-09-08 DIAGNOSIS — N281 Cyst of kidney, acquired: Secondary | ICD-10-CM | POA: Diagnosis present

## 2024-09-08 DIAGNOSIS — Z887 Allergy status to serum and vaccine status: Secondary | ICD-10-CM

## 2024-09-08 LAB — CBC WITH DIFFERENTIAL/PLATELET
Abs Immature Granulocytes: 0.03 K/uL (ref 0.00–0.07)
Basophils Absolute: 0 K/uL (ref 0.0–0.1)
Basophils Relative: 1 %
Eosinophils Absolute: 0.4 K/uL (ref 0.0–0.5)
Eosinophils Relative: 5 %
HCT: 41.5 % (ref 39.0–52.0)
Hemoglobin: 14.2 g/dL (ref 13.0–17.0)
Immature Granulocytes: 0 %
Lymphocytes Relative: 17 %
Lymphs Abs: 1.3 K/uL (ref 0.7–4.0)
MCH: 30.3 pg (ref 26.0–34.0)
MCHC: 34.2 g/dL (ref 30.0–36.0)
MCV: 88.7 fL (ref 80.0–100.0)
Monocytes Absolute: 0.9 K/uL (ref 0.1–1.0)
Monocytes Relative: 13 %
Neutro Abs: 4.9 K/uL (ref 1.7–7.7)
Neutrophils Relative %: 64 %
Platelets: 229 K/uL (ref 150–400)
RBC: 4.68 MIL/uL (ref 4.22–5.81)
RDW: 16.2 % — ABNORMAL HIGH (ref 11.5–15.5)
WBC: 7.5 K/uL (ref 4.0–10.5)
nRBC: 0 % (ref 0.0–0.2)

## 2024-09-08 LAB — COMPREHENSIVE METABOLIC PANEL WITH GFR
ALT: 12 U/L (ref 0–44)
AST: 14 U/L — ABNORMAL LOW (ref 15–41)
Albumin: 4 g/dL (ref 3.5–5.0)
Alkaline Phosphatase: 88 U/L (ref 38–126)
Anion gap: 10 (ref 5–15)
BUN: 11 mg/dL (ref 8–23)
CO2: 29 mmol/L (ref 22–32)
Calcium: 8.7 mg/dL — ABNORMAL LOW (ref 8.9–10.3)
Chloride: 90 mmol/L — ABNORMAL LOW (ref 98–111)
Creatinine, Ser: 0.74 mg/dL (ref 0.61–1.24)
GFR, Estimated: >60 mL/min (ref >60)
Glucose, Bld: 96 mg/dL (ref 70–99)
Potassium: 3.4 mmol/L — ABNORMAL LOW (ref 3.5–5.1)
Sodium: 128 mmol/L — ABNORMAL LOW (ref 135–145)
Total Bilirubin: 0.8 mg/dL (ref 0.0–1.2)
Total Protein: 6 g/dL — ABNORMAL LOW (ref 6.5–8.1)

## 2024-09-08 LAB — URINALYSIS, ROUTINE W REFLEX MICROSCOPIC
Bilirubin Urine: NEGATIVE
Glucose, UA: NEGATIVE mg/dL
Hgb urine dipstick: NEGATIVE
Ketones, ur: NEGATIVE mg/dL
Leukocytes,Ua: NEGATIVE
Nitrite: NEGATIVE
Protein, ur: NEGATIVE mg/dL
Specific Gravity, Urine: 1.009 (ref 1.005–1.030)
pH: 8 (ref 5.0–8.0)

## 2024-09-08 LAB — MAGNESIUM: Magnesium: 2.1 mg/dL (ref 1.7–2.4)

## 2024-09-08 LAB — TSH: TSH: 3.75 u[IU]/mL (ref 0.350–4.500)

## 2024-09-08 LAB — LIPASE, BLOOD: Lipase: 27 U/L (ref 11–51)

## 2024-09-08 MED ORDER — ENSURE PLUS HIGH PROTEIN PO LIQD: 237.0000 mL | Freq: Two times a day (BID) | ORAL | Status: DC

## 2024-09-08 MED ORDER — ONDANSETRON HCL 4 MG/2ML IJ SOLN
4.0000 mg | Freq: Four times a day (QID) | INTRAMUSCULAR | Status: DC | PRN
  Administered 2024-09-17 – 2024-09-18 (×2): 4 mg via INTRAVENOUS
  Filled 2024-09-08 (×2): qty 2

## 2024-09-08 MED ORDER — POLYETHYLENE GLYCOL 3350 17 G PO PACK
17.0000 g | PACK | Freq: Every day | ORAL | Status: DC
  Administered 2024-09-08 – 2024-09-19 (×10): 17 g via ORAL
  Filled 2024-09-08 (×13): qty 1

## 2024-09-08 MED ORDER — ONDANSETRON HCL 4 MG PO TABS: 4.0000 mg | ORAL_TABLET | Freq: Four times a day (QID) | ORAL | Status: DC | PRN

## 2024-09-08 MED ORDER — ENOXAPARIN SODIUM 40 MG/0.4ML IJ SOSY
40.0000 mg | PREFILLED_SYRINGE | INTRAMUSCULAR | Status: DC
  Administered 2024-09-08: 40 mg via SUBCUTANEOUS
  Filled 2024-09-08: qty 0.4

## 2024-09-08 MED ORDER — PANTOPRAZOLE SODIUM 40 MG PO TBEC
40.0000 mg | DELAYED_RELEASE_TABLET | Freq: Every day | ORAL | Status: DC
  Administered 2024-09-08 – 2024-09-12 (×5): 40 mg via ORAL
  Filled 2024-09-08 (×5): qty 1

## 2024-09-08 MED ORDER — MIRTAZAPINE 15 MG PO TBDP
15.0000 mg | ORAL_TABLET | Freq: Every day | ORAL | Status: DC
  Administered 2024-09-08: 15 mg via ORAL
  Filled 2024-09-08: qty 1

## 2024-09-08 MED ORDER — LACTATED RINGERS IV BOLUS
1000.0000 mL | Freq: Once | INTRAVENOUS | Status: AC
  Administered 2024-09-08: 1000 mL via INTRAVENOUS

## 2024-09-08 MED ORDER — LACTATED RINGERS IV SOLN: INTRAVENOUS | Status: AC

## 2024-09-08 NOTE — ED Triage Notes (Signed)
 Pt BIB [facility] for failure to thrive. Pt has not been eating or drinking normally for a while. HR was in the 40s with EMS. Denies thinners and no BP meds  CBG 100

## 2024-09-08 NOTE — Progress Notes (Signed)
 Inocente, RN made aware that PICC would be placed 11-1.

## 2024-09-08 NOTE — ED Notes (Signed)
 Patient transported to CT

## 2024-09-08 NOTE — Progress Notes (Signed)
 Alert and oriented x 4 but family at bedside said that he has periods of confusion.  Able to give clear answers and converse clearly but has some intermittent mumbling. Had been independent until this decline.  Still living alone with family very near by.  Family brought in special supper for him.

## 2024-09-08 NOTE — ED Provider Notes (Signed)
 [place] EMERGENCY DEPARTMENT AT [facility] Provider Note   CSN: [id number] Arrival date & time: 09/08/24  1130     Patient presents with: Failure To Thrive   Noah Burnett is a 83 y.o. male.   HPI Patient with history of duodenal disease.  Cancer versus otherwise.  Had recent surgery with bypass.  Had been seen at [facility] for that.  Has come home and did well for a week but then worsened.  Had been seen in the ER also.  Patient cannot really provide much history.  Difficulty hearing at baseline but also much more confused per family members.   Past Medical History:  Diagnosis Date   Arthritis    Bladder tumor    Deafness, left    Full dentures    GERD (gastroesophageal reflux disease)    History of [place] spotted fever    01-25-2020  per pt had in 2018 and was treated w/ no residual   Hyperplasia of prostate with lower urinary tract symptoms (LUTS)    Hypertension    followed by pcp   (01-25-2020  per pt had stress test approx. 2005, told normal , done in [place] somewhere)   Pre-diabetes    followed by pcp ,   watches diet   Prostate cancer [facility]) urologist--- dr watt   dx by bx01-13-2020,  Gleason 3+4 ;  MRI 12-19-2018, vol 96.26;   active survillance   Shingles    Wears glasses    Wears hearing aid in right ear     Prior to Admission medications   Medication Sig Start Date End Date Taking? Authorizing Provider  acetaminophen  (TYLENOL ) 500 MG tablet Take 1,000 mg by mouth every 6 (six) hours as needed for headache or mild pain (pain score 1-3).    [provider]  bisacodyl (DULCOLAX) 5 MG EC tablet Take 5 mg by mouth daily as needed for moderate constipation.    [provider]  hydrochlorothiazide (HYDRODIURIL) 12.5 MG tablet Take 12.5 mg by mouth daily. 02/16/23   [provider]  magnesium hydroxide (MILK OF MAGNESIA) 400 MG/5ML suspension Take 15 mLs by mouth daily as needed for mild constipation.    [provider]  omeprazole (PRILOSEC) 40 MG capsule Take 40 mg by mouth daily. 02/16/23   [provider]  valACYclovir (VALTREX) 1000 MG tablet Take 1,000 mg by mouth 3 (three) times daily. Patient not taking: Reported on 08/29/2024 08/09/24   [provider]    Allergies: Haemophilus influenzae vaccines    Review of Systems  Updated Vital Signs BP (!) 165/63   Pulse (!) 45   Temp 98.6 F (37 C) (Rectal)   Resp 15   Wt 51.2 kg   SpO2 99%   BMI 17.68 kg/m   Physical Exam Vitals and nursing note reviewed.  HENT:     Mouth/Throat:     Mouth: Mucous membranes are dry.  Abdominal:     Tenderness: There is no abdominal tenderness.  Musculoskeletal:     Right lower leg: No edema.     Left lower leg: No edema.  Neurological:     Mental Status: He is alert.     (all labs ordered are listed, but only abnormal results are displayed) Labs Reviewed  COMPREHENSIVE METABOLIC PANEL WITH GFR - Abnormal; Notable for the following components:      Result Value   Sodium 128 (*)    Potassium 3.4 (*)    Chloride 90 (*)  Calcium 8.7 (*)    Total Protein 6.0 (*)    AST 14 (*)    All other components within normal limits  CBC WITH DIFFERENTIAL/PLATELET - Abnormal; Notable for the following components:   RDW 16.2 (*)    All other components within normal limits  TSH  LIPASE, BLOOD  MAGNESIUM  URINALYSIS, ROUTINE W REFLEX MICROSCOPIC    EKG: EKG Interpretation Date/Time:  Friday September 08 2024 11:45:48 EDT Ventricular Rate:  46 PR Interval:  205 QRS Duration:  108 QT Interval:  436 QTC Calculation: 382 R Axis:   51  Text Interpretation: Sinus or ectopic atrial bradycardia Low voltage, extremity leads Abnormal T, consider ischemia, lateral leads Minimal ST elevation, lateral leads Confirmed by Patsey Lot [phone]) on 09/08/2024 11:58:18 AM  Radiology: CT Head Wo Contrast Result Date: 09/08/2024 EXAM: CT HEAD WITHOUT CONTRAST 09/08/2024 02:16:00 PM  TECHNIQUE: CT of the head was performed without the administration of intravenous contrast. Automated exposure control, iterative reconstruction, and/or weight based adjustment of the mA/kV was utilized to reduce the radiation dose to as low as reasonably achievable. COMPARISON: 08/29/2024 CLINICAL HISTORY: Head trauma, minor (Age >= 65y). FINDINGS: BRAIN AND VENTRICLES: No acute hemorrhage. No evidence of acute infarct. No hydrocephalus. No extra-axial collection. No mass effect or midline shift. Age-related volume loss. Mild to moderate periventricular and deep cerebral white matter hypodensities, likely chronic small vessel ischemic disease. ORBITS: Status post bilateral lens replacement. SINUSES: No acute abnormality. SOFT TISSUES AND SKULL: No acute soft tissue abnormality. No skull fracture. VASCULATURE: Moderate vascular calcifications. IMPRESSION: 1. No acute intracranial abnormality. 2. Age-related volume loss. 3. Mild to moderate periventricular and deep cerebral white matter hypodensities, likely chronic small vessel ischemic disease. Electronically signed by: Franky Stanford MD 09/08/2024 03:03 PM EDT RP Workstation: [id number]   DG Abdomen Acute W/Chest Result Date: 09/08/2024 EXAM: [facility] XRAY VIEWS OF THE ABDOMEN AND _VIEWS_ VIEW(S) OF THE CHEST 09/08/2024 12:19:28 PM COMPARISON: 06/20/2024 CLINICAL HISTORY: obstruction FINDINGS: LUNGS AND PLEURA: No consolidation or pulmonary edema. No pleural effusion or pneumothorax. HEART AND MEDIASTINUM: No acute abnormality of the cardiac and mediastinal silhouettes. BOWEL: The bowel gas pattern is nonspecific. No bowel obstruction. PERITONEUM AND SOFT TISSUES: No abnormal calcifications. No free air. BONES: No acute osseous abnormality. IMPRESSION: 1. No radiographic evidence of bowel obstruction or pneumoperitoneum. Electronically signed by: Lynwood Seip MD 09/08/2024 12:44 PM EDT RP Workstation: [id number]     Procedures   Medications  Ordered in the ED  lactated ringers  bolus 1,000 mL (0 mLs Intravenous Stopped 09/08/24 1427)                                    Medical Decision Making Amount and/or Complexity of Data Reviewed Labs: ordered. Radiology: ordered.   Patient with decreased mental status.  Had duodenal abnormalities postsurgery at [facility].  Now has decreased oral intake and decreased mental status with it.  Has somewhat stable laboratory abnormalities.  Has a hyponatremia it is actually a little better than prior.  However with increasing confusion decreased oral intake and previous need for TPN I think patient benefit from admission in the hospital for further evaluation and treatment.  X-ray does not show obstruction.  Benign exam from abdominal pathology standpoint.  Will discuss with hospitalist.     Final diagnoses:  Encephalopathy, unspecified type    ED Discharge Orders     None  Patsey Lot, MD 09/08/24 [phone]

## 2024-09-08 NOTE — H&P (Signed)
 History and Physical    Patient: Noah Burnett FMW:[id number] DOB: June 07, 1941 DOA: 09/08/2024 DOS: the patient was seen and examined on 09/08/2024 PCP: [name], MD  Patient coming from: Home  Chief Complaint:  Chief Complaint  Patient presents with   Failure To Thrive   HPI: KAIREN Burnett is a 83 y.o. 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  This was diagnosed at [facility].  He had an EGD that showed an hiatal hernia and compression of the second portion of the duodenum with a possible intraluminal neoplasm.  Biopsies were obtained which showed severely inflamed and ulcerated duodenal mucosa, but no evidence of malignancy.  A second EGD was done with ultrasound guidance, but again pathology of the lesion extensive necrosis.  Surgical oncology was consulted and he underwent a robotic gastrojejunostomy on 8/25.  He did have a delayed return of bowel function and had a PICC line with TPN for several days until he had advancement of his diet.  Since his procedure, he has had a fairly steady decline with decreasing appetite, significant weight loss, inability to walk.  He had follow-up with surgical oncology.  He had a repeat MRI which did not show the duodenal mass.  Today, the patient began to exhibit confusion and had an unwitnessed fall.  He was brought in to the hospital for evaluation.  Here, the patient continues to be altered with no clear speech.  He mumbles and is unable to give any meaningful history.  The history is obtained by the patient's son who relayed all the above information.  There is no fevers, chills, nausea, vomiting.  He does not complain of any abdominal pain.  In the ED, the patient had an abdominal x-ray which showed no evidence of obstruction.  Labs showed chronic and stable hyponatremia.  Kidney function is normal with a creatinine of 0.74.  Review of Systems: As mentioned in  the history of present illness. All other systems reviewed and are negative. Past Medical History:  Diagnosis Date   Arthritis    Bladder tumor    Deafness, left    Full dentures    GERD (gastroesophageal reflux disease)    History of [place] spotted fever    01-25-2020  per pt had in 2018 and was treated w/ no residual   Hyperplasia of prostate with lower urinary tract symptoms (LUTS)    Hypertension    followed by pcp   (01-25-2020  per pt had stress test approx. 2005, told normal , done in [place] somewhere)   Pre-diabetes    followed by pcp ,   watches diet   Prostate cancer [facility]) urologist--- dr watt   dx by bx01-13-2020,  Gleason 3+4 ;  MRI 12-19-2018, vol 96.26;   active survillance   Shingles    Wears glasses    Wears hearing aid in right ear    Past Surgical History:  Procedure Laterality Date   SHOULDER ARTHROSCOPY WITH ROTATOR CUFF REPAIR AND SUBACROMIAL DECOMPRESSION Right 05-20-2009  @[facility]    TRANSURETHRAL RESECTION OF BLADDER TUMOR WITH MITOMYCIN -C N/A 02/01/2020   Procedure: TRANSURETHRAL RESECTION OF BLADDER TUMOR;  Surgeon: Watt Norleen, MD;  Location[facility];  Service: Urology;  Laterality: N/A;   TRANSURETHRAL RESECTION OF PROSTATE     Social History:  reports that he quit smoking about 25 years ago. His smoking use included cigarettes. He started smoking about 61 years  ago. He has a 36 pack-year smoking history. He has been exposed to tobacco smoke. He has never used smokeless tobacco. He reports that he does not currently use alcohol. He reports that he does not use drugs.  Allergies  Allergen Reactions   Haemophilus Influenzae Vaccines Other (See Comments)    I get deathly sick    History reviewed. No pertinent family history.  Prior to Admission medications   Medication Sig Start Date End Date Taking? Authorizing Provider  acetaminophen  (TYLENOL ) 500 MG tablet Take 1,000 mg by mouth every 6 (six) hours as needed for  headache or mild pain (pain score 1-3).   Yes [provider]  docusate sodium (COLACE) 100 MG capsule Take 100 mg by mouth 2 (two) times daily.   Yes [provider]  hydrochlorothiazide (HYDRODIURIL) 12.5 MG tablet Take 12.5 mg by mouth daily. 02/16/23  Yes [provider]  megestrol (MEGACE) 40 MG/ML suspension Take 400 mg by mouth every morning. 09/04/24  Yes [provider]  omeprazole (PRILOSEC) 40 MG capsule Take 40 mg by mouth daily. 02/16/23  Yes [provider]  polyethylene glycol (MIRALAX / GLYCOLAX) 17 g packet Take 17 g by mouth daily.   Yes [provider]  valACYclovir (VALTREX) 1000 MG tablet Take 1,000 mg by mouth 3 (three) times daily. Patient not taking: Reported on 08/29/2024 08/09/24   [provider]    Physical Exam: Vitals:   09/08/24 1400 09/08/24 1445 09/08/24 1515 09/08/24 1626  BP: (!) 167/68 (!) 165/63 133/65 124/65  Pulse: (!) 46 (!) 45 (!) 45 (!) 44  Resp: 14 15 12 17   Temp:    97.6 F (36.4 C)  TempSrc:    Axillary  SpO2: 99% 99% 100% 100%  Weight:    45.6 kg  Height:    5' 7 (1.702 m)   General: Elderly male. Awake and alert and oriented x3. No acute cardiopulmonary distress.  HEENT: Normocephalic atraumatic.  Right and left ears normal in appearance.  Pupils equal, round, reactive to light. Extraocular muscles are intact. Sclerae anicteric and noninjected.  Moist mucosal membranes. No mucosal lesions.  Neck: Neck supple without lymphadenopathy. No carotid bruits. No masses palpated.  Cardiovascular: Regular rate with normal S1-S2 sounds. No murmurs, rubs, gallops auscultated. No JVD.  Respiratory: Good respiratory effort with no wheezes, rales, rhonchi. Lungs clear to auscultation bilaterally.  No accessory muscle use. Abdomen: Soft, nontender, nondistended. Active bowel sounds. No masses or hepatosplenomegaly  Skin: No rashes, lesions, or ulcerations.  Dry, warm to touch. 2+ dorsalis pedis and  radial pulses. Musculoskeletal: No calf or leg pain. All major joints not erythematous nontender.  No upper or lower joint deformation.  Good ROM.  No contractures  Psychiatric: Intact judgment and insight. Pleasant and cooperative. Neurologic: No focal neurological deficits. Strength is 5/5 and symmetric in upper and lower extremities.  Cranial nerves II through XII are grossly intact.  Data Reviewed: Labs and imaging reviewed by me  Assessment and Plan: No notes have been filed under this hospital service. Service: Hospitalist  Principal Problem:   Failure to thrive in adult Active Problems:   Essential hypertension   Gastric outlet obstruction   H/O bypass gastrojejunostomy   Hyponatremia   Prediabetes  Failure to thrive in adult Patient has been started on Megace by PCP for the failure to thrive. Will trial Remeron to see if this will be helpful I had a very frank and open conversation with the patient's family.  They  would like to have a PICC line placed and to try TPN to see if nutrition will improve his mentation and allow him to start eating again on his own.  We discussed that there are potential problems with TPN including the need for PICC line.  There is significant risk of long-term PICC lines including infection, DBE's, etc.  Certainly TPN is not a long-term solution. Despite these risks, the patient's family wishes us  to trial this. Nutrition consult Consult PICC team Gastric outlet obstruction with history of bypass gastrojejunostomy No abdominal pain or nausea and vomiting. Last MRI did not demonstrate the duodenal mass Hypertension Not on medications Hyponatremia Stable   Advance Care Planning:   Code Status: Full Code confirmed by patient's family.  I did have a frank conversation about the impact of ACLS and the risks of doing ACLS.  Consults: None  Family Communication: Patient's son  Severity of Illness: The appropriate patient status for this patient  is INPATIENT. Inpatient status is judged to be reasonable and necessary in order to provide the required intensity of service to ensure the patient's safety. The patient's presenting symptoms, physical exam findings, and initial radiographic and laboratory data in the context of their chronic comorbidities is felt to place them at high risk for further clinical deterioration. Furthermore, it is not anticipated that the patient will be medically stable for discharge from the hospital within 2 midnights of admission.   * I certify that at the point of admission it is my clinical judgment that the patient will require inpatient hospital care spanning beyond 2 midnights from the point of admission due to high intensity of service, high risk for further deterioration and high frequency of surveillance required.*  Author: Afua Hoots J Rileigh Kawashima, DO 09/08/2024 4:56 PM  For on call review [email or web address].

## 2024-09-09 ENCOUNTER — Inpatient Hospital Stay (HOSPITAL_COMMUNITY)

## 2024-09-09 DIAGNOSIS — E871 Hypo-osmolality and hyponatremia: Secondary | ICD-10-CM

## 2024-09-09 DIAGNOSIS — I639 Cerebral infarction, unspecified: Secondary | ICD-10-CM

## 2024-09-09 DIAGNOSIS — I10 Essential (primary) hypertension: Secondary | ICD-10-CM | POA: Diagnosis not present

## 2024-09-09 DIAGNOSIS — G0490 Encephalitis and encephalomyelitis, unspecified: Secondary | ICD-10-CM

## 2024-09-09 DIAGNOSIS — R627 Adult failure to thrive: Secondary | ICD-10-CM | POA: Diagnosis not present

## 2024-09-09 DIAGNOSIS — I776 Arteritis, unspecified: Secondary | ICD-10-CM

## 2024-09-09 DIAGNOSIS — R29707 NIHSS score 7: Secondary | ICD-10-CM

## 2024-09-09 LAB — CBC
HCT: 48.2 % (ref 39.0–52.0)
Hemoglobin: 16 g/dL (ref 13.0–17.0)
MCH: 30.4 pg (ref 26.0–34.0)
MCHC: 33.2 g/dL (ref 30.0–36.0)
MCV: 91.6 fL (ref 80.0–100.0)
Platelets: 199 K/uL (ref 150–400)
RBC: 5.26 MIL/uL (ref 4.22–5.81)
RDW: 16.5 % — ABNORMAL HIGH (ref 11.5–15.5)
WBC: 10.1 K/uL (ref 4.0–10.5)
nRBC: 0 % (ref 0.0–0.2)

## 2024-09-09 LAB — BASIC METABOLIC PANEL WITH GFR
Anion gap: 14 (ref 5–15)
BUN: 9 mg/dL (ref 8–23)
CO2: 22 mmol/L (ref 22–32)
Calcium: 8.6 mg/dL — ABNORMAL LOW (ref 8.9–10.3)
Chloride: 92 mmol/L — ABNORMAL LOW (ref 98–111)
Creatinine, Ser: 0.67 mg/dL (ref 0.61–1.24)
GFR, Estimated: >60 mL/min (ref >60)
Glucose, Bld: 81 mg/dL (ref 70–99)
Potassium: 3.7 mmol/L (ref 3.5–5.1)
Sodium: 128 mmol/L — ABNORMAL LOW (ref 135–145)

## 2024-09-09 LAB — CORTISOL-AM, BLOOD: Cortisol - AM: 12.5 ug/dL (ref 6.7–22.6)

## 2024-09-09 LAB — T4, FREE: Free T4: 0.92 ng/dL (ref 0.61–1.12)

## 2024-09-09 LAB — GLUCOSE, CAPILLARY
Glucose-Capillary: 68 mg/dL — ABNORMAL LOW (ref 70–99)
Glucose-Capillary: 141 mg/dL — ABNORMAL HIGH (ref 70–99)

## 2024-09-09 LAB — VITAMIN B12: Vitamin B-12: 267 pg/mL (ref 180–914)

## 2024-09-09 LAB — TSH: TSH: 4.632 u[IU]/mL — ABNORMAL HIGH (ref 0.350–4.500)

## 2024-09-09 MED ORDER — LACTATED RINGERS IV SOLN: INTRAVENOUS | Status: DC

## 2024-09-09 MED ORDER — DEXTROSE 5 % IV SOLN
10.0000 mg/kg | Freq: Two times a day (BID) | INTRAVENOUS | Status: DC
  Administered 2024-09-09 – 2024-09-12 (×7): 455 mg via INTRAVENOUS
  Filled 2024-09-09 (×10): qty 9.1

## 2024-09-09 MED ORDER — BOOST / RESOURCE BREEZE PO LIQD CUSTOM
1.0000 | Freq: Three times a day (TID) | ORAL | Status: DC
  Administered 2024-09-11 – 2024-09-14 (×3): 1 via ORAL

## 2024-09-09 MED ORDER — DEXTROSE 50 % IV SOLN
12.5000 g | INTRAVENOUS | Status: AC
  Administered 2024-09-09: 12.5 g via INTRAVENOUS

## 2024-09-09 MED ORDER — TRAVASOL 10 % IV SOLN
INTRAVENOUS | Status: DC
  Filled 2024-09-09: qty 302.4

## 2024-09-09 MED ORDER — ENOXAPARIN SODIUM 30 MG/0.3ML IJ SOSY
30.0000 mg | PREFILLED_SYRINGE | INTRAMUSCULAR | Status: DC
  Administered 2024-09-09 – 2024-09-20 (×12): 30 mg via SUBCUTANEOUS
  Filled 2024-09-09 (×12): qty 0.3

## 2024-09-09 MED ORDER — STROKE: EARLY STAGES OF RECOVERY BOOK
Freq: Once | Status: AC
  Filled 2024-09-09: qty 1

## 2024-09-09 MED ORDER — GADOBUTROL 1 MMOL/ML IV SOLN
5.0000 mL | Freq: Once | INTRAVENOUS | Status: AC | PRN
  Administered 2024-09-09: 5 mL via INTRAVENOUS

## 2024-09-09 MED ORDER — SENNA 8.6 MG PO TABS
2.0000 | ORAL_TABLET | Freq: Every day | ORAL | Status: DC
  Administered 2024-09-09 – 2024-09-21 (×10): 17.2 mg via ORAL
  Filled 2024-09-09 (×12): qty 2

## 2024-09-09 MED ORDER — ADULT MULTIVITAMIN W/MINERALS CH
1.0000 | ORAL_TABLET | Freq: Every day | ORAL | Status: DC
  Administered 2024-09-10 – 2024-09-21 (×9): 1 via ORAL
  Filled 2024-09-09 (×12): qty 1

## 2024-09-09 MED ORDER — INSULIN ASPART 100 UNIT/ML IJ SOLN: 0.0000 [IU] | Freq: Three times a day (TID) | INTRAMUSCULAR | Status: DC

## 2024-09-09 MED ORDER — ACETAMINOPHEN 325 MG PO TABS
650.0000 mg | ORAL_TABLET | Freq: Four times a day (QID) | ORAL | Status: DC | PRN
Start: 2024-09-09 — End: 2024-09-21
  Administered 2024-09-17: 650 mg via ORAL
  Filled 2024-09-09: qty 2

## 2024-09-09 MED ORDER — DEXTROSE 50 % IV SOLN
INTRAVENOUS | Status: AC
  Filled 2024-09-09: qty 50

## 2024-09-09 NOTE — Progress Notes (Signed)
 Secure chat with Dr Evonnie re PICC order. Order canceled due to TNA is canceled.

## 2024-09-09 NOTE — Progress Notes (Signed)
 Pt arrived to the floor via carelink. Pts is A+OX2. Dr. Dennise and Dr. Voncile have been notified of pts arrival. Tele placed on pt. Pts rate is sinus brady. Condom catheter is intact with yellow clear urine. IV in R AC running LR at 155ml/hr. Pts daughter arrived shortyly after and has been updated on plan.

## 2024-09-09 NOTE — Progress Notes (Signed)
 Pharmacy Antibiotic Note  Noah Burnett is a 82 y.o. male admitted on 09/08/2024 with failure to thrive and confusion.  Pharmacy has been consulted for acyclovir dosing.  MRI shows multiple areas of subcortical enhancement R>L parieto-occipital lobe. New concern for encephalitis vs vasculitis vs demyelinating disease. Patient to be started on acyclovir and transferred to [facility] for neuro eval when bed available.   Plan: Acyclovir 10mg /kg q12 hours LR at 100ml/hr for hydration  Height: 5' 7 (170.2 cm) Weight: 45.6 kg (100 lb 8.5 oz) IBW/kg (Calculated) : 66.1  Temp (24hrs), Avg:97.8 F (36.6 C), Min:97.4 F (36.3 C), Max:98.2 F (36.8 C)  Recent Labs  Lab 09/08/24 1229 09/09/24 0333  WBC 7.5 10.1  CREATININE 0.74 0.67    Estimated Creatinine Clearance: 46.7 mL/min (by C-G formula based on SCr of 0.67 mg/dL).    Allergies  Allergen Reactions   Haemophilus Influenzae Vaccines Other (See Comments)    I get deathly sick    Thank you for allowing pharmacy to be a part of this patient's care.  Dempsey Blush PharmD., BCPS Clinical Pharmacist 09/09/2024 1:34 PM

## 2024-09-09 NOTE — Plan of Care (Signed)
  Problem: Education: Goal: Knowledge of General Education information will improve Description: Including pain rating scale, medication(s)/side effects and non-pharmacologic comfort measures Outcome: Progressing   Problem: Health Behavior/Discharge Planning: Goal: Ability to manage health-related needs will improve Outcome: Progressing   Problem: Clinical Measurements: Goal: Ability to maintain clinical measurements within normal limits will improve Outcome: Progressing Goal: Will remain free from infection Outcome: Progressing Goal: Diagnostic test results will improve Outcome: Progressing Goal: Respiratory complications will improve Outcome: Progressing Goal: Cardiovascular complication will be avoided Outcome: Progressing   Problem: Activity: Goal: Risk for activity intolerance will decrease Outcome: Progressing   Problem: Nutrition: Goal: Adequate nutrition will be maintained Outcome: Progressing   Problem: Coping: Goal: Level of anxiety will decrease Outcome: Progressing   Problem: Elimination: Goal: Will not experience complications related to bowel motility Outcome: Progressing Goal: Will not experience complications related to urinary retention Outcome: Progressing   Problem: Pain Managment: Goal: General experience of comfort will improve and/or be controlled Outcome: Progressing   Problem: Safety: Goal: Ability to remain free from injury will improve Outcome: Progressing   Problem: Skin Integrity: Goal: Risk for impaired skin integrity will decrease Outcome: Progressing   Problem: Nutrition Goal: Patient maintains adequate hydration Outcome: Progressing Goal: Patient maintains weight Outcome: Progressing Goal: Patient/Family demonstrates understanding of diet Outcome: Progressing Goal: Patient/Family independently completes tube feeding Outcome: Progressing Goal: Patient will have no more than 5 lb weight change during LOS Outcome:  Progressing Goal: Patient will utilize adaptive techniques to administer nutrition Outcome: Progressing Goal: Patient will verbalize dietary restrictions Outcome: Progressing

## 2024-09-09 NOTE — Hospital Course (Addendum)
 83 year old male with a history of prostate cancer (on observation), tobacco abuse, hypertension, GERD, duodenal mass resulting in gastric L obstruction status post robotic gastrojejunostomy 07/03/2024 presenting with decreased oral intake, generalized weakness, and confusion.  Patient had hospital admission at [facility] from 8/18 to 07/09/24 when he presented with nausea and vomiting.  He was found to have questionable obstruction and duodenal mass. He had an EGD that showed an hiatal hernia and compression of the second portion of the duodenum with a possible intraluminal neoplasm. Biopsies were obtained which showed severely inflamed and ulcerated duodenal mucosa, but no evidence of malignancy. A second EGD was done with ultrasound guidance, but again pathology of the lesion extensive necrosis. Surgical oncology was consulted and he underwent a robotic gastrojejunostomy on 8/25. He did have a delayed return of bowel function and had a PICC line with TPN for several days until he had advancement of his diet. Since his hospitalization, he has had a fairly steady functional decline with decreasing appetite, significant weight loss, and generalized weakness.  After discharge, the patient was tolerating a diet and still driving and walking independently.  Unfortunately, the patient developed shingles in the last week of September 2025.  He went to see ophthalmology, and he was cleared from their standpoint.  He finished a course of Valtrex.  He continues to have some postherpetic neuralgia.  Daughter states that since his shingles episode, the patient has had a functional decline with decreasing appetite, significant weight loss, and generalized weakness.  The patient has followed up with surgical oncology on 08/31/2024.  At that time the patient had reported generalized weakness and fatigue, but he did not have any vomiting or abdominal pain at that time.  He had poor p.o. intake and loss of appetite at that  time.  Repeat MRI of the abdomen and pelvis was obtained.  This was performed on 09/06/2024 which showed no specific MR findings to suggest a duodenal mass.  There was Generalized edema-like signal in the retroperitoneum is presumed reactive in the setting of known duodenal ulceration.  There were multiple benign appearing hepatic lesions presumed to be cysts or hemangioma.  There was Bosniak 2 renal cysts.     The patient has not complained of any specific issues.  There has been no fevers, chills, headache, chest pain, shortness breath, nausea, vomiting, diarrhea, abdominal pain.  In fact he has been struggling with constipation. Daughter states that in the past week he has had increasing confusion and generalized weakness with continued decreased oral intake.  In the ED, the patient was afebrile hemodynamically stable with oxygen saturation 100% room air.  WBC 7.5, hemoglobin 14.2, platelet 229.  Sodium 128, potassium 3.4, bicarbonate 29, serum creatinine 0.74.  UA was negative for pyuria.  CT of the brain was negative for acute findings.  Acute abdominal series was negative for obstruction or pneumoperitoneum. The patient was started on IV fluids.  PICC line was requested for TPN.

## 2024-09-09 NOTE — Progress Notes (Signed)
 Patient CBG 68, gave dextrose  12.5mg  IV , Dr. Evonnie notified , patient sleeping more this shift, daughter asked if we had given him anything, patient only had senna and protonix  this morning with very few sips of water. Otherwise his appetite has been very poor, and awaiting PICC line placement to initiate TPN .

## 2024-09-09 NOTE — Progress Notes (Signed)
 PHARMACY - TOTAL PARENTERAL NUTRITION CONSULT NOTE   Indication: Failure to thrive, unsuccessful toleration of enteral feeding  Patient Measurements: Height: 5' 7 (170.2 cm) Weight: 45.6 kg (100 lb 8.5 oz) IBW/kg (Calculated) : 66.1 TPN AdjBW (KG): 45.6 Body mass index is 15.75 kg/m. Usual Weight: 51kg  Assessment: 83 year old male with a history of prostate cancer (on observation), tobacco abuse, hypertension, GERD, duodenal mass resulting in gastric L obstruction status post robotic gastrojejunostomy 07/03/2024 presenting with decreased oral intake, generalized weakness, and confusion.   Patient admitted to [facility] on 8/18>8/31 with obstruction and duodenal mass, s/p robotic gastrojejunostomy 8/25. Delayed bowel function and he was placed on TPN until function returned. Patient now with failure to thrive, discussion had with family to try a short term course of TPN.   Glucose / Insulin: 81-96, no insulin need at this time Electrolytes: k 3.7, Ca 8.6, Mag 2.1 Renal: Scr 0.67 Hepatic: AST/ALT 14/12,  Intake / Output; MIVF: LR at 75, reduce to 45 when tpn starts GI Imaging:10/31 abd xray - no bowel obstruction GI Surgeries / Procedures: s/p robotic gastrojejunostomy 8/25  Central access: pending for 11/1 TPN start date: pending for 11/1  Nutritional Goals: Goal TPN rate is 50 mL/hr (provides 50 g of protein and 1400 kcals per day)  RD Assessment:pending   Current Nutrition:  Regular diet  Plan:  Start TPN at 30mL/hr at 1800 Electrolytes in TPN: Na 46mEq/L, K 60mEq/L, Ca 26mEq/L, Mg 50mEq/L, and Phos 15mmol/L. Max acetate Add standard MVI and trace elements to TPN Initiate Sensitive q8h SSI and adjust as needed  Reduce MIVF to 45 mL/hr at 1800 Monitor TPN labs on Mon/Thurs, and in am  Dempsey Blush PharmD., BCPS Clinical Pharmacist 09/09/2024 9:40 AM

## 2024-09-09 NOTE — Progress Notes (Signed)
 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  Patient had hospital admission at [facility] from 8/18 to 07/09/24 when he presented with nausea and vomiting.  He was found to have questionable obstruction and duodenal mass. He had an EGD that showed an hiatal hernia and compression of the second portion of the duodenum with a possible intraluminal neoplasm. Biopsies were obtained which showed severely inflamed and ulcerated duodenal mucosa, but no evidence of malignancy. A second EGD was done with ultrasound guidance, but again pathology of the lesion extensive necrosis. Surgical oncology was consulted and he underwent a robotic gastrojejunostomy on 8/25. He did have a delayed return of bowel function and had a PICC line with TPN for several days until he had advancement of his diet. Since his hospitalization, he has had a fairly steady functional decline with decreasing appetite, significant weight loss, and generalized weakness.  After discharge, the patient was tolerating a diet and still driving and walking independently.  Unfortunately, the patient developed shingles in the last week of September 2025.  He went to see ophthalmology, and he was cleared from their standpoint.  He finished a course of Valtrex.  He continues to have some postherpetic neuralgia.  Daughter states that since his shingles episode, the patient has had a functional decline with decreasing appetite, significant weight loss, and generalized weakness.  The patient has followed up with surgical oncology on 08/31/2024.  At that time the patient had reported generalized  weakness and fatigue, but he did not have any vomiting or abdominal pain at that time.  He had poor p.o. intake and loss of appetite at that time.  Repeat MRI of the abdomen and pelvis was obtained.  This was performed on 09/06/2024 which showed no specific MR findings to suggest a duodenal mass.  There was Generalized edema-like signal in the retroperitoneum is presumed reactive in the setting of known duodenal ulceration.  There were multiple benign appearing hepatic lesions presumed to be cysts or hemangioma.  There was Bosniak 2 renal cysts.     The patient has not complained of any specific issues.  There has been no fevers, chills, headache, chest pain, shortness breath, nausea, vomiting, diarrhea, abdominal pain.  In fact he has been struggling with constipation. Daughter states that in the past week he has had increasing confusion and generalized weakness with continued decreased oral intake.  In the ED, the patient was afebrile hemodynamically stable with oxygen saturation 100% room air.  WBC 7.5, hemoglobin 14.2, platelet 229.  Sodium 128, potassium 3.4, bicarbonate 29, serum creatinine 0.74.  UA was negative for pyuria.  CT of the brain was negative for acute findings.  Acute abdominal series was negative for obstruction or pneumoperitoneum. The patient was started on IV fluids.  PICC line was requested for TPN.   Assessment/Plan:  Failure to thrive - I had an honest discussion with the patient's daughter regarding the risks/benefits of TPN - I discussed that this is not a good long-term solution or option - We did agree to short-term trial while inpatient to see if patient shows any clinical improvement - PICC line requested - B12 - Folic acid - TSH -  am cortisol -UA neg for pyuria - PT evaluation  Acute metabolic encephalopathy - Multifactorial including dehydration, hyponatremia, and possible infection - MRI brain - UA negative for pyuria - Workup as discussed  above  Essential hypertension - Patient's HCTZ has been discontinued  Constipation - Start MiraLAX and senna,         Family Communication:  daiughter 11/1  Consultants:  none  Code Status:  FULL   DVT Prophylaxis:  [place] Lovenox   Procedures: As Listed in Progress Note Above  Antibiotics: None     Subjective: Patient denies any chest pain, shortness breath, abdominal pain, headache.  There is no nausea or vomiting.  Objective: Vitals:   09/08/24 1626 09/08/24 1945 09/09/24 0053 09/09/24 0436  BP: 124/65 112/73 (!) 133/90 139/63  Pulse: (!) 44 (!) 48 (!) 51 (!) 49  Resp: 17 18 18 17   Temp: 97.6 F (36.4 C) 97.7 F (36.5 C) (!) 97.4 F (36.3 C) 98 F (36.7 C)  TempSrc: Axillary Oral Oral Oral  SpO2: 100% 100% 100% 98%  Weight: 45.6 kg     Height: 5' 7 (1.702 m)       Intake/Output Summary (Last 24 hours) at 09/09/2024 0748 Last data filed at 09/09/2024 0500 Gross per 24 hour  Intake 2055.13 ml  Output 600 ml  Net 1455.13 ml   Weight change:  Exam:  General:  Pt is alert, follows commands appropriately, not in acute distress HEENT: No icterus, No thrush, No neck mass, [place]/AT Cardiovascular: RRR, S1/S2, no rubs, no gallops Respiratory: Fine bibasilar crackles but no wheezing Abdomen: Soft/+BS, non tender, non distended, no guarding Extremities: No edema, No lymphangitis, No petechiae, No rashes, no synovitis   Data Reviewed: I have personally reviewed following labs and imaging studies Basic Metabolic Panel: Recent Labs  Lab 09/08/24 1229 09/09/24 0333  NA 128* 128*  K 3.4* 3.7  CL 90* 92*  CO2 29 22  GLUCOSE 96 81  BUN 11 9  CREATININE 0.74 0.67  CALCIUM 8.7* 8.6*  MG 2.1  --    Liver Function Tests: Recent Labs  Lab 09/08/24 1229  AST 14*  ALT 12  ALKPHOS 88  BILITOT 0.8  PROT 6.0*  ALBUMIN 4.0   Recent Labs  Lab 09/08/24 1229  LIPASE 27   No results for input(s): AMMONIA in the last 168 hours. Coagulation  Profile: No results for input(s): INR, PROTIME in the last 168 hours. CBC: Recent Labs  Lab 09/08/24 1229 09/09/24 0333  WBC 7.5 10.1  NEUTROABS 4.9  --   HGB 14.2 16.0  HCT 41.5 48.2  MCV 88.7 91.6  PLT 229 199   Cardiac Enzymes: No results for input(s): CKTOTAL, CKMB, CKMBINDEX, TROPONINI in the last 168 hours. BNP: Invalid input(s): POCBNP CBG: No results for input(s): GLUCAP in the last 168 hours. HbA1C: No results for input(s): HGBA1C in the last 72 hours. Urine analysis:    Component Value Date/Time   COLORURINE YELLOW 09/08/2024 1356   APPEARANCEUR CLEAR 09/08/2024 1356   LABSPEC 1.009 09/08/2024 1356   PHURINE 8.0 09/08/2024 1356   GLUCOSEU NEGATIVE 09/08/2024 1356   HGBUR NEGATIVE 09/08/2024 1356   BILIRUBINUR NEGATIVE 09/08/2024 1356   KETONESUR NEGATIVE 09/08/2024 1356   PROTEINUR NEGATIVE 09/08/2024 1356   NITRITE NEGATIVE 09/08/2024 1356   LEUKOCYTESUR NEGATIVE 09/08/2024 1356   Sepsis Labs: @LABRCNTIP (procalcitonin:4,lacticidven:4) )No results found for this or any previous visit (from the past 240 hours).   Scheduled Meds:  enoxaparin (LOVENOX) injection  40  mg Subcutaneous Q24H   feeding supplement  237 mL Oral BID BM   mirtazapine  15 mg Oral QHS   pantoprazole   40 mg Oral Daily   polyethylene glycol  17 g Oral Daily   Continuous Infusions:  lactated ringers  100 mL/hr at 09/09/24 0305    Procedures/Studies: US  EKG SITE RITE Result Date: 09/08/2024 If Site Rite image not attached, placement could not be confirmed due to current cardiac rhythm.  CT Head Wo Contrast Result Date: 09/08/2024 EXAM: CT HEAD WITHOUT CONTRAST 09/08/2024 02:16:00 PM TECHNIQUE: CT of the head was performed without the administration of intravenous contrast. Automated exposure control, iterative reconstruction, and/or weight based adjustment of the mA/kV was utilized to reduce the radiation dose to as low as reasonably achievable. COMPARISON:  08/29/2024 CLINICAL HISTORY: Head trauma, minor (Age >= 65y). FINDINGS: BRAIN AND VENTRICLES: No acute hemorrhage. No evidence of acute infarct. No hydrocephalus. No extra-axial collection. No mass effect or midline shift. Age-related volume loss. Mild to moderate periventricular and deep cerebral white matter hypodensities, likely chronic small vessel ischemic disease. ORBITS: Status post bilateral lens replacement. SINUSES: No acute abnormality. SOFT TISSUES AND SKULL: No acute soft tissue abnormality. No skull fracture. VASCULATURE: Moderate vascular calcifications. IMPRESSION: 1. No acute intracranial abnormality. 2. Age-related volume loss. 3. Mild to moderate periventricular and deep cerebral white matter hypodensities, likely chronic small vessel ischemic disease. Electronically signed by: Franky Stanford MD 09/08/2024 03:03 PM EDT RP Workstation: [id number]   DG Abdomen Acute W/Chest Result Date: 09/08/2024 EXAM: [facility] SUPINE XRAY VIEWS OF THE ABDOMEN AND _VIEWS_ VIEW(S) OF THE CHEST 09/08/2024 12:19:28 PM COMPARISON: 06/20/2024 CLINICAL HISTORY: obstruction FINDINGS: LUNGS AND PLEURA: No consolidation or pulmonary edema. No pleural effusion or pneumothorax. HEART AND MEDIASTINUM: No acute abnormality of the cardiac and mediastinal silhouettes. BOWEL: The bowel gas pattern is nonspecific. No bowel obstruction. PERITONEUM AND SOFT TISSUES: No abnormal calcifications. No free air. BONES: No acute osseous abnormality. IMPRESSION: 1. No radiographic evidence of bowel obstruction or pneumoperitoneum. Electronically signed by: Lynwood Seip MD 09/08/2024 12:44 PM EDT RP Workstation: [id number]   CT Head Wo Contrast Result Date: 08/29/2024 EXAM: CT HEAD WITHOUT CONTRAST 08/29/2024 07:59:42 PM TECHNIQUE: CT of the head was performed without the administration of intravenous contrast. Automated exposure control, iterative reconstruction, and/or weight based adjustment of the mA/kV was utilized to reduce the  radiation dose to as low as reasonably achievable. COMPARISON: 03/27/2018 CLINICAL HISTORY: Mental status change, unknown cause. Worsening confusion, AMS, weakness, and fatigue, reportedly beginning after a Shingles infection. FINDINGS: BRAIN AND VENTRICLES: No acute hemorrhage. No evidence of acute infarct. No hydrocephalus. No extra-axial collection. No mass effect or midline shift. Age-related cerebral volume loss and mild periventricular and deep cerebral white matter disease. Moderate calcific atheromatous disease within the carotid siphons. ORBITS: No acute abnormality. Bilateral lens replacement noted. SINUSES: No acute abnormality. SOFT TISSUES AND SKULL: No acute soft tissue abnormality. No skull fracture. IMPRESSION: 1. No acute intracranial abnormality. Electronically signed by: Norman Gatlin MD 08/29/2024 08:05 PM EDT RP Workstation: [id number]    Alm Schneider, DO  [facility]  If 7PM-7AM, please contact night-coverage [email or web address] Password [email or web address] 09/09/2024, 7:48 AM   LOS: 1 day

## 2024-09-09 NOTE — Progress Notes (Signed)
   09/09/24 1238  Vitals  Temp 98.2 F (36.8 C)  Temp Source Oral  BP (!) 108/53  MAP (mmHg) 69  BP Location Left Arm  BP Method Automatic  Patient Position (if appropriate) Lying  Pulse Rate (!) 49  Pulse Rate Source Dinamap  Resp 17  MEWS COLOR  MEWS Score Color Green  Oxygen Therapy  SpO2 99 %  O2 Device Room Air  MEWS Score  MEWS Temp 0  MEWS Systolic 0  MEWS Pulse 1  MEWS RR 0  MEWS LOC 0  MEWS Score 1

## 2024-09-09 NOTE — Progress Notes (Addendum)
 Revisited patient at 1238PM Pt is sleeping.  Awakens easily to voice.     Subjective: Denies cp, sob, headache, abd pain, n/v, vision loss.  Vitals:   09/08/24 1945 09/09/24 0053 09/09/24 0436 09/09/24 1238  BP: 112/73 (!) 133/90 139/63 (!) 108/53  Pulse: (!) 48 (!) 51 (!) 49 (!) 49  Resp: 18 18 17 17   Temp: 97.7 F (36.5 C) (!) 97.4 F (36.3 C) 98 F (36.7 C) 98.2 F (36.8 C)  TempSrc: Oral Oral Oral Oral  SpO2: 100% 100% 98% 99%  Weight:      Height:       CV--RRR Lung--CTA Abd--soft+BS/NT -Neuro:  CN II-XII intact, strength 4/5 in RUE, RLE, strength 4/5 LUE, LLE; sensation intact bilateral; no dysmetria; babinski equivocal    Assessment/Plan: Acute metabolic Encephalopathy -follows commands -A&O x 2 -communicative although occasionally mumbles. -MR brain shows multiple areas of subcortical enhancement R>L parieto-occipital lobe.  There is concerned for encephalitis vs vasculitis vs demyelinating disease -case discussed with neurology, Drs. Voncile and [facility] -Neurology agrees to consult  -pt transferring to [facility] for neurology consult and LP -acyclovir started -daughter updated and agrees with transfer -I also told daughter that I will cancel TPN plans for now until there is more clarity regarding his clinical picture     Alm Schneider, DO [facility]

## 2024-09-09 NOTE — Progress Notes (Signed)
 Recheck CBG is now 141

## 2024-09-09 NOTE — Procedures (Signed)
 LUMBAR PUNCTURE (SPINAL TAP) PROCEDURE NOTE  Indication: Meningitis versus encephalitis   Proceduralists: Eligio Lav, MD   Risks, benefits and alternatives of the procedure were dicussed with the patient including but not limited to post-LP headache, bleeding, infection, weakness/numbness of legs(radiculopathy), death.    Consent obtained from: Daughter [name] Wingate   Procedure Note The patient was prepped and draped, and using sterile technique a 20 gauge quinke spinal needle was inserted in the L4-5 space.  Multiple attempts made.  Unfortunately no CSF yielded.  Procedure aborted after 4 tries.  Patient tolerated well We will request fluoroscopy guided LP    -- Eligio Lav, MD Neurologist [facility] Pager: [phone]

## 2024-09-09 NOTE — Progress Notes (Signed)
 Patient transported via [facility] to [facility] , report called to Burnard , CHARITY FUNDRAISER on 3W

## 2024-09-09 NOTE — Progress Notes (Signed)
 Initial Nutrition Assessment  DOCUMENTATION CODES:   Underweight  INTERVENTION:   D/C Ensure, patient is refusing. Boost Breeze po TID, each supplement provides 250 kcal and 9 grams of protein. Add Magic cup TID with meals, each supplement provides 290 kcal and 9 grams of protein. MVI with minerals daily. Patient would benefit from supplemental nutrition support; recommend Cortrak placement and initiation of tube feeding: Osmolite 1.5 at 20 ml/h, increase by 10 ml every 12 hours to goal rate of 40 ml/h with Prosource TF20 60 ml daily would provide 1520 kcal, 80 gm protein, 732 ml free water daily. Patient is at increased risk of refeeding syndrome. If TF started, recommend monitor magnesium, potassium, and phosphorus daily for at least 3 days, MD to replete as needed and add Thiamine 100 mg daily for 7 days.  NUTRITION DIAGNOSIS:   Increased nutrient needs related to acute illness as evidenced by estimated needs.  GOAL:   Patient will meet greater than or equal to 90% of their needs  MONITOR:   PO intake, Supplement acceptance  REASON FOR ASSESSMENT:   Consult Assessment of nutrition requirement/status  ASSESSMENT:   83 yo male admitted with failure to thrive. 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  Patient required TPN after gastric surgery in August at an outside hospital d/t delayed return of bowel function. His family would like a trial of TPN to see if it helps him regain mental status and start eating better. This has not been started and is now on hold with plans to transfer to [facility] for Neurology consult and LP d/t MRI results concerning for encephalitis vs vasculitis vs demyelinating disease.  Weight history reviewed. Patient with 18% weight loss within the past 3 months. He is underweight with BMI =15.75. Suspect malnutrition, but  unable to obtain enough information at this time for identification of malnutrition.   Currently on a regular diet with Ensure Plus High Protein BID.  Meal intakes not recorded. Patient is refusing Ensure supplements.  Labs reviewed.  Na 128 CBG: 68-141  Medications reviewed and include protonix , miralax, senokot. IVF: LR at 100 ml/h  NUTRITION - FOCUSED PHYSICAL EXAM:  Unable to complete  Diet Order:   Diet Order             Diet regular Room service appropriate? Yes; Fluid consistency: Thin  Diet effective now                   EDUCATION NEEDS:   Not appropriate for education at this time  Skin:  Skin Assessment: Reviewed RN Assessment  Last BM:  10/31  Height:   Ht Readings from Last 1 Encounters:  09/08/24 5' 7 (1.702 m)    Weight:   Wt Readings from Last 1 Encounters:  09/08/24 45.6 kg    Ideal Body Weight:  67.3 kg  BMI:  Body mass index is 15.75 kg/m.  Estimated Nutritional Needs:   Kcal:  1500-1700  Protein:  75-90 gm  Fluid:  1.5-1.7 L   Suzen HUNT RD, LDN, CNSC Contact via secure chat. If unavailable, use group chat RD Inpatient.

## 2024-09-09 NOTE — Plan of Care (Signed)
   Problem: Education: Goal: Knowledge of General Education information will improve Description Including pain rating scale, medication(s)/side effects and non-pharmacologic comfort measures Outcome: Progressing   Problem: Health Behavior/Discharge Planning: Goal: Ability to manage health-related needs will improve Outcome: Progressing

## 2024-09-09 NOTE — Consult Note (Signed)
 NEUROLOGY CONSULT NOTE   Date of service: September 09, 2024 Patient Name: Noah Burnett MRN:  [id number] DOB:  January 12, 1941 Chief Complaint:  AMS, failure to thrive Requesting Provider: Evonnie Lenis, MD  History of Present Illness  Noah Burnett is a 83 y.o. 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  He had GI procedures done in August at [facility], had delayed return of bowel function and had PICC line for TPN for several days. He also had shingles of the face and the eye which was treated with antibiotics for which he finished his course of Valtrex.  About a week after finishing Valtrex, that is when family noticed generalized decline weakness and decreased p.o. intake.  It is in the last 67 days that he has been extremely more lethargic and confused and was found down on the ground confused without clothes which made the family bring him to [facility]  As part of the workup for failure to thrive and altered mental status, an MRI of the brain was obtained which shows the changes documented below.  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  LKW: Multiple days ago Modified rankin score: 4-Needs assistance to walk and tend to bodily needs, may be 5 Not a candidate for any procedure due to being outside the window  NIHSS components Score: Comment  1a Level of Conscious 0[]  1[x]  2[]  3[]      1b LOC Questions 0[x]  1[]  2[]       1c LOC Commands 0[x]  1[]  2[]       2 Best Gaze 0[x]  1[]  2[]       3 Visual 0[x]  1[]  2[]  3[]      4 Facial Palsy 0[x]  1[]  2[]  3[]      5a Motor Arm - left 0[]  1[x]  2[]  3[]  4[]  UN[]    5b Motor Arm - Right 0[]  1[x]   2[]  3[]  4[]  UN[]    6a Motor Leg - Left 0[]  1[x]  2[]  3[]  4[]  UN[]    6b Motor Leg - Right 0[]  1[x]  2[]  3[]  4[]  UN[]    7 Limb Ataxia 0[x]  1[]  2[]  UN[]      8 Sensory 0[x]  1[]  2[]  UN[]      9 Best Language 0[]  1[x]  2[]  3[]      10 Dysarthria 0[]  1[x]  2[]  UN[]      11 Extinct. and Inattention 0[x]  1[]  2[]       TOTAL: 7      ROS  Unable to perform due to altered mentation  Past History   Past Medical History:  Diagnosis Date   Arthritis    Bladder tumor    Deafness, left    Full dentures    GERD (gastroesophageal reflux disease)    History of [place] spotted fever    01-25-2020  per pt had in 2018 and was treated w/ no residual   Hyperplasia of prostate with lower urinary tract symptoms (LUTS)    Hypertension    followed by pcp   (01-25-2020  per pt had stress test approx. 2005, told normal , done in [place] somewhere)   Pre-diabetes    followed by pcp ,   watches diet   Prostate cancer [facility]) urologist--- dr watt   dx by bx01-13-2020,  Gleason 3+4 ;  MRI 12-19-2018, vol 96.26;   active survillance   Shingles    Wears glasses    Wears hearing aid in right ear     Past Surgical History:  Procedure Laterality Date   SHOULDER ARTHROSCOPY WITH ROTATOR CUFF REPAIR AND SUBACROMIAL DECOMPRESSION Right 05-20-2009  @[facility]    TRANSURETHRAL RESECTION OF BLADDER TUMOR WITH MITOMYCIN -C N/A 02/01/2020   Procedure: TRANSURETHRAL RESECTION OF BLADDER TUMOR;  Surgeon: Watt Rush, MD;  Location: [facility];  Service: Urology;  Laterality: N/A;   TRANSURETHRAL RESECTION OF PROSTATE      Family History: History reviewed. No pertinent family history.  Social History  reports that he quit smoking about 25 years ago. His smoking use included cigarettes. He started smoking about 61 years ago. He has a 36 pack-year smoking history. He has been exposed to tobacco smoke. He has never used smokeless tobacco. He reports that he does not currently use alcohol. He reports that he  does not use drugs.  Allergies  Allergen Reactions   Haemophilus Influenzae Vaccines Other (See Comments)    I get deathly sick    Medications   Current Facility-Administered Medications:    acetaminophen  (TYLENOL ) tablet 650 mg, 650 mg, Oral, Q6H PRN, Tat, David, MD   acyclovir (ZOVIRAX) 455 mg in dextrose  5 % 100 mL IVPB, 10 mg/kg, Intravenous, Q12H, Tanda Dempsey SAUNDERS, RPH, Last Rate: 109.1 mL/hr at 09/09/24 1507, 455 mg at 09/09/24 1507   enoxaparin (LOVENOX) injection 30 mg, 30 mg, Subcutaneous, Q24H, Wilson, Dempsey SAUNDERS, RPH   feeding supplement (BOOST / RESOURCE BREEZE) liquid 1 Container, 1 Container, Oral, TID BM, Evonnie Lenis, MD   lactated ringers  infusion, , Intravenous, Continuous, Dennise Lavada POUR, MD, Last Rate: 100 mL/hr at 09/09/24 1340, Rate Change at 09/09/24 1340   multivitamin with minerals tablet 1 tablet, 1 tablet, Oral, Daily, Tat, David, MD   ondansetron  (ZOFRAN ) tablet 4 mg, 4 mg, Oral, Q6H PRN **OR** ondansetron  (ZOFRAN ) injection 4 mg, 4 mg, Intravenous, Q6H PRN, Stinson, Jacob J, DO   pantoprazole  (PROTONIX ) EC tablet 40 mg, 40 mg, Oral, Daily, Stinson, Jacob J, DO, 40 mg at 09/09/24 0845   polyethylene glycol (MIRALAX / GLYCOLAX) packet 17 g, 17 g, Oral, Daily, Stinson, Jacob J, DO, 17 g at 09/09/24 0845   senna (SENOKOT) tablet 17.2 mg, 2 tablet, Oral, Daily, Tat, David, MD, 17.2 mg at 09/09/24 0849  Vitals   Vitals:   09/09/24 0053 09/09/24 0436 09/09/24 1238 09/09/24 1657  BP: (!) 133/90 139/63 (!) 108/53 120/63  Pulse: (!) 51 (!) 49 (!) 49 (!) 53  Resp: 18 17 17 15   Temp: (!) 97.4 F (36.3 C) 98 F (36.7 C) 98.2 F (36.8 C) (!) 97.3 F (36.3 C)  TempSrc: Oral Oral Oral Oral  SpO2: 100% 98% 99% 100%  Weight:      Height:        Body mass index is 15.75 kg/m.   Physical Exam   General: Cachectic looking man somewhat disheveled HEENT: Normocephalic atraumatic Lungs: Clear  Neurologic Examination  Somewhat drowsy Opens eyes to voice Follow  simple commands Was able to tell his name, his age, said  month of [place] I give him some credit for since it is just 1 November. When asked where he is, he said and reads well-that is where he was prior to transferring to [place]. Naming somewhat impaired, comprehension intact but fluency also somewhat impaired. Mild dysarthria Cranial nerves II to XII grossly intact  Motor examination has symmetric drift in all fours Withdraws to noxious stimulation in all fours  Labs/Imaging/Neurodiagnostic studies   CBC:  Recent Labs  Lab October 05, 2024 1229 09/09/24 0333  WBC 7.5 10.1  NEUTROABS 4.9  --   HGB 14.2 16.0  HCT 41.5 48.2  MCV 88.7 91.6  PLT 229 199   Basic Metabolic Panel:  Lab Results  Component Value Date   NA 128 (L) 09/09/2024   K 3.7 09/09/2024   CO2 22 09/09/2024   GLUCOSE 81 09/09/2024   BUN 9 09/09/2024   CREATININE 0.67 09/09/2024   CALCIUM 8.6 (L) 09/09/2024   GFRNONAA >60 09/09/2024   GFRAA >60 03/27/2018   MR brain: Multifocal subcortical contrast-enhancement greater in the right parieto-occipital fissure but present at multiple subcortical locations in both hemisphere.  Small areas of diffusion abnormality at the anterior insula on the left, left frontal operculum, right frontal operculum and in multiple locations on both corona radiata and some lesions appearing more acute than others based on lower ADC values.  There are multiple possibilities with above findings: Differentials include infectious versus inflammatory encephalitis or vasculitis and CSF sampling should be considered.  Repeated embolic showers with started infarcts of different ages also possible.  Demyelinating disease less likely  ASSESSMENT   Noah Burnett is a 83 y.o. male presenting for progressive confusion after having gone through multiple procedures for GI issues, noted to be more confused over the last few days.  MR imaging of the brain is concerning for multiple lesions some of  them enhancing some of them nonenhancing with differentials that include encephalitis versus infectious etiology versus embolic infarcts. A bedside LP was attempted-unsuccessful. Will send for IR guided LP tomorrow. Given concern for a duodenal mass, paraneoplastic encephalitis should be in the differentials also. Given recent shingles infection for which she was treated, a viral encephalitis should be considered as well.  He is on acyclovir already for encephalitic coverage  Impression: Progressive worsening altered mental status with a broad differential category to include: - Stroke - Vasculitis - Encephalitis/infectious etiology - Inflammatory etiology - Paraneoplastic/autoimmune   RECOMMENDATIONS  Continue acyclovir Fluoroscopy guided LP I would also do stroke risk factor workup to include 2D echocardiogram, lipid panel, A1c, frequent neurochecks and telemetry. Will try to get a EEG as well tomorrow Send serum autoimmune and paraneoplastic panel Also sent CSF autoimmune and paraneoplastic panel once CSF is obtained Will hold off on steroids for now Close neuromonitoring Plan was discussed with patient's daughter at bedside. Plan was discussed with Dr. DAT  ______________________________________________________________________    Signed, Eligio Lav, MD [facility]

## 2024-09-10 ENCOUNTER — Inpatient Hospital Stay (HOSPITAL_COMMUNITY)

## 2024-09-10 DIAGNOSIS — R4182 Altered mental status, unspecified: Secondary | ICD-10-CM | POA: Diagnosis not present

## 2024-09-10 DIAGNOSIS — R569 Unspecified convulsions: Secondary | ICD-10-CM

## 2024-09-10 DIAGNOSIS — K311 Adult hypertrophic pyloric stenosis: Secondary | ICD-10-CM | POA: Diagnosis not present

## 2024-09-10 DIAGNOSIS — I639 Cerebral infarction, unspecified: Secondary | ICD-10-CM | POA: Diagnosis not present

## 2024-09-10 DIAGNOSIS — Z515 Encounter for palliative care: Secondary | ICD-10-CM

## 2024-09-10 DIAGNOSIS — E871 Hypo-osmolality and hyponatremia: Secondary | ICD-10-CM | POA: Diagnosis not present

## 2024-09-10 DIAGNOSIS — I10 Essential (primary) hypertension: Secondary | ICD-10-CM | POA: Diagnosis not present

## 2024-09-10 DIAGNOSIS — I6389 Other cerebral infarction: Secondary | ICD-10-CM | POA: Diagnosis not present

## 2024-09-10 DIAGNOSIS — I776 Arteritis, unspecified: Secondary | ICD-10-CM | POA: Diagnosis not present

## 2024-09-10 DIAGNOSIS — Z7189 Other specified counseling: Secondary | ICD-10-CM | POA: Diagnosis not present

## 2024-09-10 DIAGNOSIS — R627 Adult failure to thrive: Secondary | ICD-10-CM | POA: Diagnosis not present

## 2024-09-10 DIAGNOSIS — I351 Nonrheumatic aortic (valve) insufficiency: Secondary | ICD-10-CM

## 2024-09-10 DIAGNOSIS — G0490 Encephalitis and encephalomyelitis, unspecified: Secondary | ICD-10-CM | POA: Diagnosis not present

## 2024-09-10 DIAGNOSIS — R29707 NIHSS score 7: Secondary | ICD-10-CM | POA: Diagnosis not present

## 2024-09-10 LAB — CSF CELL COUNT WITH DIFFERENTIAL
RBC Count, CSF: 0 /mm3
RBC Count, CSF: 12 /mm3 — ABNORMAL HIGH
Tube #: 1
Tube #: 4
WBC, CSF: 1 /mm3 (ref 0–5)
WBC, CSF: 2 /mm3 (ref 0–5)

## 2024-09-10 LAB — MENINGITIS/ENCEPHALITIS PANEL (CSF)

## 2024-09-10 LAB — CBC WITH DIFFERENTIAL/PLATELET
Abs Immature Granulocytes: 0.04 K/uL (ref 0.00–0.07)
Basophils Absolute: 0 K/uL (ref 0.0–0.1)
Basophils Relative: 0 %
Eosinophils Absolute: 0.3 K/uL (ref 0.0–0.5)
Eosinophils Relative: 3 %
HCT: 42.1 % (ref 39.0–52.0)
Hemoglobin: 14.6 g/dL (ref 13.0–17.0)
Immature Granulocytes: 1 %
Lymphocytes Relative: 14 %
Lymphs Abs: 1.2 K/uL (ref 0.7–4.0)
MCH: 30.3 pg (ref 26.0–34.0)
MCHC: 34.7 g/dL (ref 30.0–36.0)
MCV: 87.3 fL (ref 80.0–100.0)
Monocytes Absolute: 0.7 K/uL (ref 0.1–1.0)
Monocytes Relative: 9 %
Neutro Abs: 6.2 K/uL (ref 1.7–7.7)
Neutrophils Relative %: 73 %
Platelets: 214 K/uL (ref 150–400)
RBC: 4.82 MIL/uL (ref 4.22–5.81)
RDW: 16.6 % — ABNORMAL HIGH (ref 11.5–15.5)
WBC: 8.5 K/uL (ref 4.0–10.5)
nRBC: 0 % (ref 0.0–0.2)

## 2024-09-10 LAB — COMPREHENSIVE METABOLIC PANEL WITH GFR
ALT: 11 U/L (ref 0–44)
AST: 14 U/L — ABNORMAL LOW (ref 15–41)
Albumin: 3 g/dL — ABNORMAL LOW (ref 3.5–5.0)
Alkaline Phosphatase: 65 U/L (ref 38–126)
Anion gap: 12 (ref 5–15)
BUN: 9 mg/dL (ref 8–23)
CO2: 24 mmol/L (ref 22–32)
Calcium: 8.3 mg/dL — ABNORMAL LOW (ref 8.9–10.3)
Chloride: 91 mmol/L — ABNORMAL LOW (ref 98–111)
Creatinine, Ser: 0.73 mg/dL (ref 0.61–1.24)
GFR, Estimated: >60 mL/min (ref >60)
Glucose, Bld: 95 mg/dL (ref 70–99)
Potassium: 4.1 mmol/L (ref 3.5–5.1)
Sodium: 127 mmol/L — ABNORMAL LOW (ref 135–145)
Total Bilirubin: 1.1 mg/dL (ref 0.0–1.2)
Total Protein: 5.6 g/dL — ABNORMAL LOW (ref 6.5–8.1)

## 2024-09-10 LAB — PROTEIN AND GLUCOSE, CSF
Glucose, CSF: 44 mg/dL (ref 40–70)
Total  Protein, CSF: 65 mg/dL — ABNORMAL HIGH (ref 15–45)

## 2024-09-10 LAB — ECHOCARDIOGRAM COMPLETE
Area-P 1/2: 2.48 cm2
Est EF: 55
Height: 67 in
S' Lateral: 2.8 cm
Weight: 1608.48 [oz_av]

## 2024-09-10 LAB — CK: Total CK: 30 U/L — ABNORMAL LOW (ref 49–397)

## 2024-09-10 LAB — PROTIME-INR
INR: 1 (ref 0.8–1.2)
Prothrombin Time: 14.2 s (ref 11.4–15.2)

## 2024-09-10 LAB — C-REACTIVE PROTEIN: CRP: 4.1 mg/dL — ABNORMAL HIGH (ref <1.0)

## 2024-09-10 LAB — LIPID PANEL
Cholesterol: 130 mg/dL (ref 0–200)
HDL: 44 mg/dL (ref >40)
LDL Cholesterol: 75 mg/dL (ref 0–99)
Total CHOL/HDL Ratio: 3 ratio
Triglycerides: 55 mg/dL (ref <150)
VLDL: 11 mg/dL (ref 0–40)

## 2024-09-10 LAB — HEMOGLOBIN A1C
Hgb A1c MFr Bld: 5.6 % (ref 4.8–5.6)
Mean Plasma Glucose: 114.02 mg/dL

## 2024-09-10 LAB — TSH: TSH: 6.172 u[IU]/mL — ABNORMAL HIGH (ref 0.350–4.500)

## 2024-09-10 LAB — PROCALCITONIN: Procalcitonin: <0.1 ng/mL

## 2024-09-10 LAB — PHOSPHORUS: Phosphorus: 3.6 mg/dL (ref 2.5–4.6)

## 2024-09-10 LAB — SODIUM, URINE, RANDOM: Sodium, Ur: 137 mmol/L

## 2024-09-10 LAB — OSMOLALITY, URINE: Osmolality, Ur: 490 mosm/kg (ref 300–900)

## 2024-09-10 LAB — MAGNESIUM: Magnesium: 1.7 mg/dL (ref 1.7–2.4)

## 2024-09-10 MED ORDER — ROSUVASTATIN CALCIUM 5 MG PO TABS
10.0000 mg | ORAL_TABLET | Freq: Every day | ORAL | Status: DC
  Administered 2024-09-10 – 2024-09-21 (×9): 10 mg via ORAL
  Filled 2024-09-10 (×11): qty 2

## 2024-09-10 MED ORDER — ASPIRIN 81 MG PO TBEC
81.0000 mg | DELAYED_RELEASE_TABLET | Freq: Every day | ORAL | Status: DC
  Administered 2024-09-10 – 2024-09-12 (×3): 81 mg via ORAL
  Filled 2024-09-10 (×4): qty 1

## 2024-09-10 MED ORDER — IOHEXOL 350 MG/ML SOLN
75.0000 mL | Freq: Once | INTRAVENOUS | Status: AC | PRN
  Administered 2024-09-10: 75 mL via INTRAVENOUS

## 2024-09-10 MED ORDER — LIDOCAINE 1 % OPTIME INJ - NO CHARGE
5.0000 mL | Freq: Once | INTRAMUSCULAR | Status: AC
  Administered 2024-09-10: 4 mL via INTRADERMAL
  Filled 2024-09-10: qty 6

## 2024-09-10 NOTE — Evaluation (Signed)
 Occupational Therapy Evaluation Patient Details Name: Noah Burnett MRN: [id number] DOB: 1941-03-04 Today's Date: 09/10/2024   History of Present Illness   83 y.o. male presents to [facility] 09/08/24 after unwitnessed fall and AMS. Admitted with failure to thrive after multiple GI procedures. MRI brain concerning for multiple lesions with differentials that include encephalitis versus infectious etiology versus embolic infarcts. EEG showed encephalopathy. PMHx: HTN, prediabetes, GERD, gastric outlet obstruction w/ history of bypass gastrojejunostomy     Clinical Impressions Pt ind at baseline, living alone, but has required assist for ADLs and use of RW for mobility for 2 weeks prior to admission, per family also needed family assist during the daytime. Pt currently needs up to max A for ADLs, mod A for bed mobility and mod +2 for standing from EOB and taking 3-4 side steps to L. Pt with impaired cognition, needs multimodal cues to follow single step commands throughout session. Pt presenting with impairments listed below, will follow acutely. Patient will benefit from intensive inpatient follow-up therapy, >3 hours/day to maximize safety/ind with ADL/functional mobility.      If plan is discharge home, recommend the following:   Two people to help with walking and/or transfers;A lot of help with bathing/dressing/bathroom;Assistance with cooking/housework;Direct supervision/assist for medications management;Direct supervision/assist for financial management;Assist for transportation;Help with stairs or ramp for entrance;Supervision due to cognitive status     Functional Status Assessment   Patient has had a recent decline in their functional status and demonstrates the ability to make significant improvements in function in a reasonable and predictable amount of time.     Equipment Recommendations   Other (comment) (defer)     Recommendations for Other Services   PT consult;Rehab  consult     Precautions/Restrictions   Precautions Precautions: Fall Restrictions Weight Bearing Restrictions Per Provider Order: No     Mobility Bed Mobility Overal bed mobility: Needs Assistance Bed Mobility: Sit to Supine, Supine to Sit     Supine to sit: Mod assist Sit to supine: Mod assist        Transfers Overall transfer level: Needs assistance Equipment used: 2 person hand held assist Transfers: Sit to/from Stand Sit to Stand: Mod assist, +2 physical assistance                  Balance Overall balance assessment: Needs assistance Sitting-balance support: Feet supported Sitting balance-Leahy Scale: Fair Sitting balance - Comments: anterior lean     Standing balance-Leahy Scale: Poor Standing balance comment: reliant on external support                           ADL either performed or assessed with clinical judgement   ADL Overall ADL's : Needs assistance/impaired Eating/Feeding: NPO   Grooming: Sitting;Moderate assistance   Upper Body Bathing: Moderate assistance;Bed level   Lower Body Bathing: Maximal assistance;Bed level   Upper Body Dressing : Moderate assistance;Bed level   Lower Body Dressing: Maximal assistance;Bed level   Toilet Transfer: Moderate assistance;+2 for physical assistance   Toileting- Clothing Manipulation and Hygiene: Maximal assistance       Functional mobility during ADLs: Moderate assistance;+2 for physical assistance       Vision   Additional Comments: will further assess     Perception Perception: Not tested       Praxis Praxis: Not tested       Pertinent Vitals/Pain Pain Assessment Pain Assessment: No/denies pain     Extremity/Trunk Assessment Upper Extremity  Assessment Upper Extremity Assessment: Difficult to assess due to impaired cognition;Right hand dominant (noted decr grasp in R hand compared to L but pt also with impaired command follow)   Lower Extremity Assessment Lower  Extremity Assessment: Defer to PT evaluation   Cervical / Trunk Assessment Cervical / Trunk Assessment: Kyphotic (not baseline)   Communication Communication Communication: Impaired Factors Affecting Communication: Reduced clarity of speech;Difficulty expressing self (mumbling 1-2 words during session, speech unclear)   Cognition Arousal: Alert Behavior During Therapy: Flat affect Cognition: Cognition impaired   Orientation impairments: Situation, Time Awareness: Online awareness impaired Memory impairment (select all impairments): Short-term memory Attention impairment (select first level of impairment): Sustained attention Executive functioning impairment (select all impairments): Initiation OT - Cognition Comments: incr time and cues to follow basic /single step commands during session                 Following commands: Impaired Following commands impaired: Follows one step commands inconsistently     Cueing  General Comments   Cueing Techniques: Verbal cues  VSS   Exercises     Shoulder Instructions      Home Living Family/patient expects to be discharged to:: Private residence Living Arrangements: Alone Available Help at Discharge: Family;Available PRN/intermittently (has someone sitting with him during the day) Type of Home: House Home Access: Stairs to enter [employer] of Steps: 1   Home Layout: One level     Bathroom Shower/Tub: Walk-in shower         Home Equipment: Pharmacist, Hospital (2 wheels);Wheelchair - manual          Prior Functioning/Environment Prior Level of Function : Needs assist;Driving;History of Falls (last six months)             Mobility Comments: RW for the last 2 weeks, no AD prior to that, fall morning prior to coming to ED ADLs Comments: ind with ADLs 2 weeks ago; family does meals, drives    OT Problem List: Decreased strength;Decreased range of motion;Decreased activity tolerance;Impaired  balance (sitting and/or standing);Decreased cognition;Decreased safety awareness;Decreased coordination;Impaired UE functional use   OT Treatment/Interventions: Self-care/ADL training;Therapeutic exercise;Energy conservation;DME and/or AE instruction;Therapeutic activities;Balance training;Patient/family education;Cognitive remediation/compensation      OT Goals(Current goals can be found in the care plan section)   Acute Rehab OT Goals Patient Stated Goal: pt unable to state OT Goal Formulation: Patient unable to participate in goal setting Time For Goal Achievement: 09/24/24 Potential to Achieve Goals: Good ADL Goals Pt Will Perform Upper Body Dressing: sitting;with contact guard assist Pt Will Perform Lower Body Dressing: with contact guard assist;sitting/lateral leans;sit to/from stand Pt Will Transfer to Toilet: with contact guard assist;ambulating;regular height toilet Additional ADL Goal #1: pt will follow single step command with 75% accuracy in prep for ADLs   OT Frequency:  Min 2X/week    Co-evaluation PT/OT/SLP Co-Evaluation/Treatment: Yes Reason for Co-Treatment: Complexity of the patient's impairments (multi-system involvement);Necessary to address cognition/behavior during functional activity;To address functional/ADL transfers   OT goals addressed during session: ADL's and self-care;Strengthening/ROM      AM-PAC OT 6 Clicks Daily Activity     Outcome Measure Help from another person eating meals?: A Lot Help from another person taking care of personal grooming?: A Lot Help from another person toileting, which includes using toliet, bedpan, or urinal?: A Lot Help from another person bathing (including washing, rinsing, drying)?: A Lot Help from another person to put on and taking off regular upper body clothing?: A Lot Help from another  person to put on and taking off regular lower body clothing?: A Lot 6 Click Score: 12   End of Session Equipment Utilized  During Treatment: Gait belt Nurse Communication: Mobility status  Activity Tolerance: Patient tolerated treatment well Patient left: in bed;with call bell/phone within reach;with bed alarm set  OT Visit Diagnosis: Unsteadiness on feet (R26.81);Other abnormalities of gait and mobility (R26.89);Muscle weakness (generalized) (M62.81)                Time: 8951-8891 OT Time Calculation (min): 20 min Charges:  OT General Charges $OT Visit: 1 Visit OT Evaluation $OT Eval Moderate Complexity: 1 Mod  Kadence Mimbs K, OTD, OTR/L SecureChat Preferred Acute Rehab [phone]   Andraya Frigon K Koonce 09/10/2024, 12:09 PM

## 2024-09-10 NOTE — Plan of Care (Signed)
 Family at his side.  [place] he feels better today and son said he even ate a little better yesterday evening, as he had not been having an appetite lately.  Waiting to go have his LP.    Problem: Education: Goal: Knowledge of secondary prevention will improve (MUST DOCUMENT ALL) Outcome: Progressing   Problem: Education: Goal: Knowledge of patient specific risk factors will improve (DELETE if not current risk factor) Outcome: Progressing   Problem: Nutrition: Goal: Risk of aspiration will decrease Outcome: Progressing   Problem: Nutrition: Goal: Dietary intake will improve Outcome: Progressing   Problem: Skin Integrity: Goal: Risk for impaired skin integrity will decrease Outcome: Progressing

## 2024-09-10 NOTE — Progress Notes (Signed)
 NEUROLOGY CONSULT FOLLOW UP NOTE   Date of service: September 10, 2024 Patient Name: Noah Burnett MRN:  [id number] DOB:  Dec 26, 1940  Interval Hx/subjective  Seen and examined.  No changes overnight Fluoroscopy guided LP completed  Vitals   Vitals:   09/09/24 2018 09/09/24 2347 09/10/24 0357 09/10/24 1215  BP: 108/62 (!) 141/67 (!) 147/58 (!) 112/57  Pulse: 81 (!) 48 60 (!) 53  Resp: 17 17 17 18   Temp: 98.1 F (36.7 C) 97.8 F (36.6 C) 99.9 F (37.7 C) 98.3 F (36.8 C)  TempSrc: Oral Oral Oral Oral  SpO2: 99% 98% 99% 100%  Weight:      Height:         Body mass index is 15.75 kg/m.  Physical Exam   General: Comfortable sleeping in bed HEENT: Normocephalic atraumatic Lungs: Clear Neurological exam Sleeping in bed, opens eyes to voice. Able to follow simple commands Was able to say his name, age and month. Knows he is in the hospital Poor attention concentration No gross aphasia Cranial nerves II to XII intact Motor examination with symmetric strength in all fours with minimal drift Sensation: Withdraws to noxious stimulation in all fours.   Medications  Current Facility-Administered Medications:     stroke: early stages of recovery book, , Does not apply, Once, Voncile Isles, MD   acetaminophen  (TYLENOL ) tablet 650 mg, 650 mg, Oral, Q6H PRN, Tat, Alm, MD   acyclovir (ZOVIRAX) 455 mg in dextrose  5 % 100 mL IVPB, 10 mg/kg, Intravenous, Q12H, Tanda Dempsey SAUNDERS, [employer] [facility], Last Rate: 109.1 mL/hr at 09/10/24 0611, 455 mg at 09/10/24 0611   enoxaparin (LOVENOX) injection 30 mg, 30 mg, Subcutaneous, Q24H, Tanda Dempsey SAUNDERS, RPH, 30 mg at 09/09/24 2147   feeding supplement (BOOST / RESOURCE BREEZE) liquid 1 Container, 1 Container, Oral, TID BM, Evonnie Alm, MD   lactated ringers  infusion, , Intravenous, Continuous, Dennise Lavada POUR, MD, Last Rate: 75 mL/hr at 09/09/24 2025, New Bag at 09/09/24 2025   lidocaine  (XYLOCAINE ) injection 1 % - NO CHARGE, 5 mL, Intradermal, Once,  Voncile Isles, MD   multivitamin with minerals tablet 1 tablet, 1 tablet, Oral, Daily, Tat, Alm, MD   ondansetron  (ZOFRAN ) tablet 4 mg, 4 mg, Oral, Q6H PRN **OR** ondansetron  (ZOFRAN ) injection 4 mg, 4 mg, Intravenous, Q6H PRN, Stinson, Jacob J, DO   pantoprazole  (PROTONIX ) EC tablet 40 mg, 40 mg, Oral, Daily, Stinson, Jacob J, DO, 40 mg at 09/09/24 0845   polyethylene glycol (MIRALAX / GLYCOLAX) packet 17 g, 17 g, Oral, Daily, Stinson, Jacob J, DO, 17 g at 09/09/24 0845   senna (SENOKOT) tablet 17.2 mg, 2 tablet, Oral, Daily, Tat, David, MD, 17.2 mg at 09/09/24 0849  Labs and Diagnostic Imaging   CBC:  Recent Labs  Lab 09/08/24 1229 09/09/24 0333 09/10/24 0255  WBC 7.5 10.1 8.5  NEUTROABS 4.9  --  6.2  HGB 14.2 16.0 14.6  HCT 41.5 48.2 42.1  MCV 88.7 91.6 87.3  PLT 229 199 214    Basic Metabolic Panel:  Lab Results  Component Value Date   NA 127 (L) 09/10/2024   K 4.1 09/10/2024   CO2 24 09/10/2024   GLUCOSE 95 09/10/2024   BUN 9 09/10/2024   CREATININE 0.73 09/10/2024   CALCIUM 8.3 (L) 09/10/2024   GFRNONAA >60 09/10/2024   GFRAA >60 03/27/2018   Lipid Panel:  Lab Results  Component Value Date   LDLCALC 75 09/10/2024   HgbA1c:  Lab Results  Component Value Date  HGBA1C 5.6 09/10/2024    CSF results: Tube #1: 2 WBCs, 12 RBCs, protein 65, glucose 44 Tube #4: 1 WBC, 0 RBC Meningitis encephalitis panel pending Cytology pending Flow cytometry pending Autoimmune panel/paraneoplastic panel pending VDRL pending CSF Gram stain pending  MRI brain: multiple subcortical contrast-enhancing areas greater in the right parieto-occipital fissure but present at multiple subcortical locations in both MCAs. Small areas of diffusion abnormality at the anterior insula on the left, left frontal operculum and right frontal operculum in multiple locations in both coronary radiata and some lesions appearing more acute than others based on lower ADC values. Differentials include  infectious versus inflammatory encephalitis, vasculitis with recent embolic showers and a demyelinating disease being lower on the differentials  Routine EEG: Intermittent generalized slowing  Assessment   JOSIEL GAHM is a 83 y.o. male presenting for progressive confusion after having gone through some procedures for GI issues for duodenal mass-not sure if there is any malignancy-family reports no malignancy.  MRI was performed which is concerning for enhancing and nonenhancing lesions with broad differential which includes inflammatory, infectious, paraneoplastic processes. Also of note-had shingles infection which was treated, so a viral encephalitis should also be considered. Already on acyclovir for viral encephalitis coverage. As of right now, only glucose, protein and cell count have been reported.  Cell count is not suspicious for an infectious etiology.  He does have increased protein in CSF.   Recommendations  For the encephalopathy - Await full testing results from CSF - Continue acyclovir - No need for antiseizure medications-no evidence of seizure and EEG unremarkable - Consider steroids for autoimmune/inflammatory process such as vasculitis or autoimmune encephalitis if the infectious results are negative  For the differential of stroke on the MRI - 2D echo pending, A1c at goal below 6, LDL near goal.  Goal is less than 70, his LDL is 75. -Consider statin - He has had his fluoro-guided LP, can start him on aspirin 81 - Encephalopathy and strokes may be able to tie down together with 1 diagnosis of vasculitis, especially VZV vasculitis given recent shingles.  If meningitis/encephalitis panel is negative for VZV, I would also send separate VZV PCR  Plan discussed with son and daughter at bedside Plan discussed with Dr. Cherlyn  ______________________________________________________________________   Signed, Eligio Lav, MD [facility]

## 2024-09-10 NOTE — Procedures (Signed)
 PROCEDURE SUMMARY:  Successful fluoroscopic guided lumbar puncture at the level of L5-S1.  Opening pressure was 13 cm H2O ~15 mL clear colorless fluid collected and sent for labs.  No immediate complications.  Pt tolerated well.   EBL = none  Please see full dictation in imaging section of Epic for procedure details.   Electronically Signed: Alyxandria Wentz M Iriel Nason, PA-C 09/10/2024, 1:19 PM

## 2024-09-10 NOTE — Progress Notes (Signed)
 SLP Cancellation Note  Patient Details Name: KERBY BORNER MRN: [id number] DOB: Jul 27, 1941   Cancelled treatment:       Reason Eval/Treat Not Completed: Patient at procedure or test/unavailable.  Pt is currently OTF for procedure.  SLP will f/u as schedule allows.    Earnie SQUIBB Gael Delude 09/10/2024, 1:00 PM

## 2024-09-10 NOTE — Evaluation (Signed)
 Physical Therapy Evaluation Patient Details Name: Noah Burnett MRN: [id number] DOB: [birth year] Today's Date: 09/10/2024  History of Present Illness  83 y.o. male presents to [facility] 09/08/24 after unwitnessed fall and AMS. Admitted with failure to thrive after multiple GI procedures. MRI brain concerning for multiple lesions with differentials that include encephalitis versus infectious etiology versus embolic infarcts. EEG showed encephalopathy. PMHx: HTN, prediabetes, GERD, gastric outlet obstruction w/ history of bypass gastrojejunostomy   Clinical Impression  Per family, pt has had a recent decline in mobility in the past two weeks with pt ambulating with a RW with one fall prior to admit. History taken from family as pt was drowsy and intermittently following commands. Pt would attempt to communicate via head nods with occasional mumbling. Able to perform bed mobility with ModA with ModA/MinA for seated balance. Pt initially had a posterior lean progressing to an anterior lean after standing. Pt required ModAx2 via [facility] to stand with ability to slowly takes steps towards HOB. Noted B LE fatigue and instability when stepping with a  forward flexed posture. At this time, recommending >3hrs post acute rehab to decrease caregiver burden and maximize rehab potential. Acute PT to follow.       If plan is discharge home, recommend the following: A lot of help with walking and/or transfers;A lot of help with bathing/dressing/bathroom;Assistance with cooking/housework;Direct supervision/assist for medications management;Direct supervision/assist for financial management;Assist for transportation;Help with stairs or ramp for entrance   Can travel by private vehicle   No     Equipment Recommendations Other (comment) (TBD with progress)     Functional Status Assessment Patient has had a recent decline in their functional status and demonstrates the ability to make significant improvements in function in  a reasonable and predictable amount of time.     Precautions / Restrictions Precautions Precautions: Fall Recall of Precautions/Restrictions: Impaired Restrictions Weight Bearing Restrictions Per Provider Order: No      Mobility  Bed Mobility Overal bed mobility: Needs Assistance Bed Mobility: Sit to Supine, Supine to Sit    Supine to sit: Mod assist Sit to supine: Mod assist   General bed mobility comments: able to slowly bring LE's towards EOB with assist to complete. Cues to roll and assist to raise trunk. ModA for return to supine for LE management    Transfers Overall transfer level: Needs assistance Equipment used: 2 person hand held assist Transfers: Sit to/from Stand Sit to Stand: Mod assist, +2 physical assistance    General transfer comment: ModAx2 via [facility] with assist to boost-up and steady. Able to take a few steps towards [facility] with increased time and effort. Noted B LE fatigue with slight instability observed    Ambulation/Gait  General Gait Details: Deferred 2/2 level of alertness     Modified Rankin (Stroke Patients Only) Modified Rankin (Stroke Patients Only) Pre-Morbid Rankin Score: Moderately severe disability Modified Rankin: Severe disability     Balance Overall balance assessment: Needs assistance Sitting-balance support: Feet supported Sitting balance-Leahy Scale: Poor Sitting balance - Comments: initially ModA progressing to MinA. Posterior lean upon sitting upright with anterior lean after standing Postural control: Posterior lean, Other (comment) (anterior) Standing balance support: Bilateral upper extremity supported, During functional activity, Reliant on assistive device for balance Standing balance-Leahy Scale: Zero Standing balance comment: reliant on ModAx2        Pertinent Vitals/Pain Pain Assessment Pain Assessment: No/denies pain    Home Living Family/patient expects to be discharged to:: Private residence Living  Arrangements: Alone Available  Help at Discharge: Family;Available PRN/intermittently (has someone sitting with him during the day) Type of Home: House Home Access: Stairs to enter Entrance Stairs-Rails: None Entrance Stairs-Number of Steps: 1   Home Layout: One level Home Equipment: Pharmacist, Hospital (2 wheels);Wheelchair - manual      Prior Function Prior Level of Function : Needs assist;Driving;History of Falls (last six months)      Mobility Comments: RW for the last 2 weeks, no AD prior to that, fall morning prior to coming to ED ADLs Comments: ind with ADLs 2 weeks ago; family does meals, drives     Extremity/Trunk Assessment   Upper Extremity Assessment Upper Extremity Assessment: Defer to OT evaluation    Lower Extremity Assessment Lower Extremity Assessment: Difficult to assess due to impaired cognition;Generalized weakness    Cervical / Trunk Assessment Cervical / Trunk Assessment: Kyphotic (not baseline)  Communication   Communication Communication: Impaired Factors Affecting Communication: Reduced clarity of speech;Difficulty expressing self (mumbling 1-2 words during session, speech unclear)    Cognition Arousal: Lethargic Behavior During Therapy: Flat affect   PT - Cognitive impairments: Difficult to assess Difficult to assess due to: Level of arousal    PT - Cognition Comments: Limited verbalizations with pt occasionally nodding yes/no. When attempting to communicate, pt would mumble with incomprehensible speech Following commands: Impaired Following commands impaired: Follows one step commands inconsistently     Cueing Cueing Techniques: Verbal cues     General Comments General comments (skin integrity, edema, etc.): VSS     PT Assessment Patient needs continued PT services  PT Problem List Decreased strength;Decreased balance;Decreased activity tolerance;Decreased mobility;Decreased cognition;Decreased knowledge of use of DME;Decreased  safety awareness       PT Treatment Interventions DME instruction;Gait training;Functional mobility training;Therapeutic activities;Therapeutic exercise;Balance training;Neuromuscular re-education;Patient/family education    PT Goals (Current goals can be found in the Care Plan section)  Acute Rehab PT Goals Patient Stated Goal: unable to state goal PT Goal Formulation: Patient unable to participate in goal setting Time For Goal Achievement: 09/24/24 Potential to Achieve Goals: Fair    Frequency Min 2X/week     Co-evaluation   Reason for Co-Treatment: Complexity of the patient's impairments (multi-system involvement);Necessary to address cognition/behavior during functional activity;To address functional/ADL transfers PT goals addressed during session: Mobility/safety with mobility;Balance OT goals addressed during session: ADL's and self-care;Strengthening/ROM       AM-PAC PT 6 Clicks Mobility  Outcome Measure Help needed turning from your back to your side while in a flat bed without using bedrails?: A Lot Help needed moving from lying on your back to sitting on the side of a flat bed without using bedrails?: A Lot Help needed moving to and from a bed to a chair (including a wheelchair)?: Total Help needed standing up from a chair using your arms (e.g., wheelchair or bedside chair)?: Total Help needed to walk in hospital room?: Total Help needed climbing 3-5 steps with a railing? : Total 6 Click Score: 8    End of Session Equipment Utilized During Treatment: Gait belt Activity Tolerance: Patient limited by lethargy;Patient limited by fatigue Patient left: in bed;with call bell/phone within reach;with chair alarm set;with family/visitor present Nurse Communication: Mobility status PT Visit Diagnosis: Unsteadiness on feet (R26.81);Other abnormalities of gait and mobility (R26.89);Muscle weakness (generalized) (M62.81);History of falling (Z91.81)    Time: 8952-8892 PT  Time Calculation (min) (ACUTE ONLY): 20 min   Charges:   PT Evaluation $PT Eval Moderate Complexity: 1 Mod   PT General Charges $$  ACUTE PT VISIT: 1 Visit       Kate ORN, PT, DPT Secure Chat Preferred  Rehab Office [phone]   Kate BRAVO Wendolyn 09/10/2024, 12:53 PM

## 2024-09-10 NOTE — Consult Note (Addendum)
 Palliative Medicine Inpatient Consult Note  Consulting Provider: Stinson, Jacob J, DO   Reason for consult:   Palliative Care Consult Services Palliative Medicine Consult  Reason for Consult? 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 Palliative care has been asked to support goals of care conversations.   Clinical Assessment/Goals of Care:  *Please note that this is a verbal dictation therefore any spelling or grammatical errors are due to the Dragon Medical One system interpretation.  I have reviewed medical records including EPIC notes, labs and imaging, received report from bedside RN, assessed the patient who is lying in bed, quite sleepy this morning. Has no nonverbal signs of distress.    I met with Noah Burnett, Henry son to further discuss diagnosis prognosis, GOC, EOL wishes, disposition and options.   I introduced Palliative Medicine as specialized medical care for people living with serious illness. It focuses on providing relief from the symptoms and stress of a serious illness. The goal is to improve quality of life for both the patient and the family.  Medical History Review and Understanding:  A review of Noah Burnett's past medical history significant for left ear deafness, prostate cancer, hypertension, GERD, duodenal mass requiring resection in August of this year, prediabetes, and shingles was completed.  Social History:  Noah Burnett is originally from [place], [place] .  He lives in [place] now.  His son shares that they have 110 acres which have been divided between the patient's three children.  He also has six grandchildren and three great-grandchildren.  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  His son shares he gets the greatest joy out of working.  He is a man of faith practicing within [employer] -the [employer] denomination.  Functional and Nutritional State:  Proceeding ER visit on 10/21 Noah Burnett was quite function despite surgical intervention for necrotic duodenal mass.   He had endured shingles which caused more physical decline and confusion however his son notes that prior to this Noah Burnett was full functional of all bADLs and was still utilizing his stand up atmos energy.   He had driven himself to the doctors that last week of September and his symptoms appeared more pronounced around 26-Aug-2024- he has an increase in mobility and gait issues. He was able to eat well prior to all of this.   Advance Directives:  A detailed discussion was had today regarding advanced directives.  Calab had never created formal advanced directives per his family. At this time in the setting of his insight colopathy he would be unable to.  Code Status:  Concepts specific to code status, artifical feeding and hydration, continued IV antibiotics and rehospitalization was had.  The difference between a aggressive medical intervention path  and a palliative comfort care path for this patient at this time was had.   Noah shares that he has spoken to his dad on numerous occasions regarding cardiopulmonary resuscitation in the past his father has been fairly assertive on the desire to do everything to improve his health.  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  He shares for now he would like to honor his father's wish to be full code/full scope of care until he understands there is reason to transition in a different  healthcare direction.  Discussion:  Noah and I discussed the circumstances leading to hospitalization as well as the complex hospitalization Noah Burnett had at [facility].  He shares that his father was able to recover fairly well from that as he was there from the end of July to the end of August went home had building energy back up and was eating and drinking significantly well.  Noah notes that after [name] had gotten shingles there had been a precipitous decline in his mobility, cognition and secondary to that his nutritional intake.  Noah does note since being here the patient does have an increase in what he is eating and drinking.  He shares not only himself but his brother and sister will be coming by regularly to assist with meals.  Noah shares it is very difficult for he and his family to conceptualize how sick Noah Burnett is and his illness is thought to be related to his shingles.  He is presently being treated with acyclovir and CSF studies are being processed.  Best case and worst-case scenario's were reviewed in brief.  Noah and his family are hopeful for improvement in cognition and mobility and nutrition.  He understands if these are neglected to be seen or if Juelz worsens in the oncoming days that additional conversations and decisions will need to be made.  He feels he has a good understanding from working in a healthcare facility though does note that his siblings are struggling with Noah Burnett's poor health at this time.  I emphasized if additional emotional support is needed that the palliative care team is here to help.  I also shared that our goals are to honor what is important to Noah Burnett and provide education to his family under the present circumstances.  We agreed to allow time for potential outcomes.   Discussed the importance of continued conversation with family and their  medical providers regarding overall plan of care and treatment options, ensuring decisions are  within the context of the patients values and GOCs.  Decision Maker: Noah Burnett (son) [phone]  SUMMARY OF RECOMMENDATIONS   Full code/full scope of care  Allowing time for outcomes --> Patients family would like to see if Noah Burnett can make improvement(s)  Appreciate neurology support in terms of what to expect moving forward pending additional diagnostic studies  Patient's family plan to be present to support one-on-one feedings  Emotional support provided  Ongoing palliative care support  Code Status/Advance Care Planning: FULL CODE  Palliative Prophylaxis:  Aspiration, Bowel Regimen, Delirium Protocol, Frequent Pain Assessment, Oral Care, Palliative Wound Care, and Turn Reposition  Additional Recommendations (Limitations, Scope, Preferences): Continue current care  Psycho-social/Spiritual:  Desire for further Chaplaincy support: Not presently Additional Recommendations: Discussion regarding herpes related encephalitis   Prognosis: Worrisome at this time given the level of somnolence patient is endorsing in combination with his underlying chronic disease burden  Discharge Planning: To be determined  Vitals:   09/09/24 2347 09/10/24 0357  BP: (!) 141/67 (!) 147/58  Pulse: (!) 48 60  Resp: 17 17  Temp: 97.8 F (36.6 C) 99.9 F (37.7 C)  SpO2: 98% 99%    Intake/Output Summary (Last 24 hours) at 09/10/2024 0730 Last data filed at 09/10/2024 0400 Gross per 24  hour  Intake 441.92 ml  Output 775 ml  Net -333.08 ml   Last Weight  Most recent update: 09/08/2024  4:29 PM    Weight  45.6 kg (100 lb 8.5 oz)            LABS: CBC:    Component Value Date/Time   WBC 8.5 09/10/2024 0255   HGB 14.6 09/10/2024 0255   HCT 42.1 09/10/2024 0255   PLT 214 09/10/2024 0255   MCV 87.3 09/10/2024 0255   NEUTROABS 6.2 09/10/2024 0255   LYMPHSABS 1.2 09/10/2024 0255   MONOABS 0.7 09/10/2024 0255   EOSABS 0.3 09/10/2024 0255   BASOSABS 0.0 09/10/2024 0255    Comprehensive Metabolic Panel:    Component Value Date/Time   NA 127 (L) 09/10/2024 0255   K 4.1 09/10/2024 0255   CL 91 (L) 09/10/2024 0255   CO2 24 09/10/2024 0255   BUN 9 09/10/2024 0255   CREATININE 0.73 09/10/2024 0255   GLUCOSE 95 09/10/2024 0255   CALCIUM 8.3 (L) 09/10/2024 0255   AST 14 (L) 09/10/2024 0255   ALT 11 09/10/2024 0255   ALKPHOS 65 09/10/2024 0255   BILITOT 1.1 09/10/2024 0255   PROT 5.6 (L) 09/10/2024 0255   ALBUMIN 3.0 (L) 09/10/2024 0255    Gen: Elderly Caucasian male chronically ill in appearance HEENT: Dry mucous membranes CV: Regular rate and rhythm PULM: On room air breathing is even and nonlabored ABD: soft/nontender EXT: No edema Neuro: Somnolent  PPS: 30%   This conversation/these recommendations were discussed with patient primary care team, Dr. Cherlyn  ______________________________________________________ Rosaline Becton [facility] Palliative Medicine Team Team Cell Phone: [phone] Please utilize secure chat with additional questions, if there is no response within 30 minutes please call the above phone number  Total Time: 75 Billing based on MDM: High  Palliative Medicine Team providers are available by phone from 7am to 7pm daily and can be reached through the team cell phone.  Should this patient require assistance outside of these hours, please call the patient's attending physician.

## 2024-09-10 NOTE — Progress Notes (Signed)
 EEG complete - results pending

## 2024-09-10 NOTE — Progress Notes (Signed)
 Inpatient Rehab Admissions Coordinator Note:   Per therapy patient was screened for CIR candidacy by Murel Shenberger SHAUNNA Yvone Cohens, CCC-SLP. At this time, pt appears to be a potential candidate for CIR. I will place an order for rehab consult for full assessment, per our protocol.  Please contact me any with questions.SABRA Tinnie Yvone Cohens, MS, CCC-SLP Admissions Coordinator [phone] 09/10/24 5:00 PM

## 2024-09-10 NOTE — Procedures (Signed)
 Patient Name: Noah Burnett  MRN: [id number]  Epilepsy Attending: Arlin MALVA Krebs  Referring Physician/Provider: Voncile Isles, MD  Date: 09/10/2024 Duration: 25.59 mins  Patient history: 83yo M with ams. EEG to evaluate for seizure  Level of alertness: Awake, asleep  AEDs during EEG study: None  Technical aspects: This EEG study was done with scalp electrodes positioned according to the 10-20 International system of electrode placement. Electrical activity was reviewed with band pass filter of 1-70Hz , sensitivity of 7 uV/mm, display speed of 69mm/sec with a 60Hz  notched filter applied as appropriate. EEG data were recorded continuously and digitally stored.  Video monitoring was available and reviewed as appropriate.  Description: The posterior dominant rhythm consists of 8-9Hz  activity of moderate voltage (25-35 uV) seen predominantly in posterior head regions, symmetric and reactive to eye opening and eye closing. Sleep was characterized by vertex waves, sleep spindles (12 to 14 Hz), maximal frontocentral region. There is intermittent generalized 3 to 6 Hz theta-delta slowing. Hyperventilation and photic stimulation were not performed.     ABNORMALITY - Intermittent slow, generalized  IMPRESSION: This study is suggestive of diffuse cerebral dysfunction (encephalopathy). No seizures or epileptiform discharges were seen throughout the recording.  Noah Burnett O Noah Burnett

## 2024-09-10 NOTE — Progress Notes (Signed)
 [facility]                                                                               Noah Burnett, is a 83 y.o. 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  Patient had hospital admission at [facility] from 8/18 to 07/09/24 when he presented with nausea and vomiting.  He was found to have questionable obstruction and duodenal mass. He had an EGD that showed an hiatal hernia and compression of the second portion of the duodenum with a possible intraluminal neoplasm. Biopsies were obtained which showed severely inflamed and ulcerated duodenal mucosa, but no evidence of malignancy. A second EGD was done with ultrasound guidance, but again pathology of the lesion extensive necrosis. Surgical oncology was consulted and he underwent a robotic gastrojejunostomy on 8/25. He did have a delayed return of bowel function and had a PICC line with TPN for several days until he had advancement of his diet. Since his hospitalization, he has had a fairly steady functional decline with decreasing appetite, significant weight loss, and generalized weakness.  After discharge, the patient was tolerating a diet and still driving and walking independently.  Unfortunately, the patient developed shingles in the last week of September 2025.  He went to see ophthalmology, and he was cleared from their standpoint.  He finished a course of Valtrex.  He continues to have some postherpetic neuralgia.  Daughter states that since his shingles episode, the patient has had a functional decline with decreasing appetite,  significant weight loss, and generalized weakness.  The patient has followed up with surgical oncology on 08/31/2024.  At that time the patient had reported generalized weakness and fatigue, but he did not have any vomiting or abdominal pain at that time.  He had poor p.o. intake and loss of appetite at that time.  Repeat MRI of the abdomen and pelvis was obtained.  This was performed on 09/06/2024 which showed no specific MR findings to suggest a duodenal mass.  There was Generalized edema-like signal in the retroperitoneum is presumed reactive in the setting of known duodenal ulceration.  There were multiple benign appearing hepatic lesions presumed to be cysts or hemangioma.  There was Bosniak 2 renal cysts.     The patient has not complained of any specific issues.  There has been no fevers, chills, headache, chest pain, shortness breath, nausea, vomiting, diarrhea, abdominal pain.  In fact he has been struggling with constipation. Daughter states that in the past week he has had increasing confusion and generalized weakness with continued decreased oral intake.  UA was negative for pyuria.  CT of the brain was negative for acute findings.  Acute abdominal series was negative for obstruction or pneumoperitoneum. The patient was started on IV fluids.     Assessment & Plan  Assessment and Plan:   Failure to thrive/acute metabolic encephalopathy MRI of the brain shows multiple areas of subcortical enhancement in the parieto-occipital lobe.  Differential include encephalitis versus vasculitis versus demyelinating disease Neurology consulted and patient was transferred from [facility] to [facility]. Patient underwent fluoroscopic guided lumbar puncture and fluid sent for analysis for evaluation of meningitis and encephalitis. Procalcitonin is negative, CRP is 4.1     Hyponatremia History of chronic hyponatremia. Sodium in August was 129.  Patient was admitted with a sodium of 126 on  10/21. TSH slightly elevated. Check serum osmolality, urine osmolality, urine sodium/. Check sodium levels in the morning     GERD Continue with PPI    Hypertension Blood pressure parameters are optimal    RN Pressure Injury Documentation:    Malnutrition Type:  Nutrition Problem: Increased nutrient needs Etiology: acute illness   Malnutrition Characteristics:  Signs/Symptoms: estimated needs   Nutrition Interventions:  Interventions: Boost Breeze, Magic cup, MVI  Estimated body mass index is 15.75 kg/m as calculated from the following:   Height as of this encounter: 5' 7 (1.702 m).   Weight as of this encounter: 45.6 kg.  Code Status: full code.  DVT Prophylaxis:  enoxaparin (LOVENOX) injection 30 mg Start: 09/09/24 2200   Level of Care: Level of care: Telemetry Family Communication: son at bedside.   Disposition Plan:     Remains inpatient appropriate:  pending further work up   Procedures:  LP  Consultants:   Neurology at bedside.   Antimicrobials:   Anti-infectives (From admission, onward)    Start     Dose/Rate Route Frequency Ordered Stop   09/09/24 1500  acyclovir (ZOVIRAX) 455 mg in dextrose  5 % 100 mL IVPB        10 mg/kg  45.6 kg 109.1 mL/hr over 60 Minutes Intravenous Every 12 hours 09/09/24 1331          Medications  Scheduled Meds:  enoxaparin (LOVENOX) injection  30 mg Subcutaneous Q24H   feeding supplement  1 Container Oral TID BM   multivitamin with minerals  1 tablet Oral Daily   pantoprazole   40 mg Oral Daily   polyethylene glycol  17 g Oral Daily   senna  2 tablet Oral Daily   Continuous Infusions:  acyclovir (ZOVIRAX) 455 mg in dextrose  5 % 100 mL IVPB 455 mg (09/10/24 0611)   PRN Meds:.acetaminophen , ondansetron  **OR** ondansetron  (ZOFRAN ) IV    Subjective:   Noah Burnett was seen and examined today.  No events overnight.   Objective:   Vitals:   09/09/24 2018 09/09/24 2347 09/10/24 0357 09/10/24  1215  BP: 108/62 (!) 141/67 (!) 147/58 (!) 112/57  Pulse: 81 (!) 48 60 (!) 53  Resp: 17 17 17 18   Temp: 98.1 F (36.7 C) 97.8 F (36.6 C) 99.9 F (37.7 C) 98.3 F (36.8 C)  TempSrc: Oral Oral Oral Oral  SpO2: 99% 98% 99% 100%  Weight:      Height:        Intake/Output Summary (Last 24 hours) at 09/10/2024 1512 Last data filed at 09/10/2024 0400 Gross per 24 hour  Intake 441.92 ml  Output 775 ml  Net -333.08 ml   Filed Weights   09/08/24 1138 09/08/24 1626  Weight: 51.2 kg 45.6 kg     Exam General exam: Appears calm and comfortable  Respiratory system: Clear to auscultation. Respiratory effort normal. Cardiovascular system: S1 & S2 heard, RRR. Gastrointestinal system: Abdomen is soft bs+ Central nervous system:  somnolent, opens eyes to verbal cues, oriented to self only. Confused, not following much commands.  Extremities: no pedal edema.  Skin: No rashes,     Data Reviewed:  I have personally reviewed following labs and imaging studies   CBC Lab Results  Component Value Date   WBC 8.5 09/10/2024   RBC 4.82 09/10/2024   HGB 14.6 09/10/2024   HCT 42.1 09/10/2024   MCV 87.3 09/10/2024   MCH 30.3 09/10/2024   PLT 214 09/10/2024   MCHC 34.7 09/10/2024   RDW 16.6 (H) 09/10/2024   LYMPHSABS 1.2 09/10/2024   MONOABS 0.7 09/10/2024   EOSABS 0.3 09/10/2024   BASOSABS 0.0 09/10/2024     Last metabolic panel Lab Results  Component Value Date   NA 127 (L) 09/10/2024   K 4.1 09/10/2024   CL 91 (L) 09/10/2024   CO2 24 09/10/2024   BUN 9 09/10/2024   CREATININE 0.73 09/10/2024   GLUCOSE 95 09/10/2024   GFRNONAA >60 09/10/2024   GFRAA >60 03/27/2018   CALCIUM 8.3 (L) 09/10/2024   PHOS 3.6 09/10/2024   PROT 5.6 (L) 09/10/2024   ALBUMIN 3.0 (L) 09/10/2024   BILITOT 1.1 09/10/2024   ALKPHOS 65 09/10/2024   AST 14 (L) 09/10/2024   ALT 11 09/10/2024   ANIONGAP 12 09/10/2024    CBG (last 3)  Recent Labs    09/09/24 1201 09/09/24 1242  GLUCAP 68* 141*       Coagulation Profile: Recent Labs  Lab 09/10/24 0255  INR 1.0     Radiology Studies: DG FL GUIDED LUMBAR PUNCTURE Result Date: 09/10/2024 CLINICAL DATA:  83 year old male with a history of progressively worsening confusion, lethargy, and decreasing oral intake. Noticed to have worsening generalized declined following shingles outbreak. Request for lumbar puncture to rule out HSV encephalitis. EXAM: LUMBAR PUNCTURE UNDER FLUOROSCOPY PROCEDURE: An appropriate skin entry site was determined fluoroscopically. Operator donned sterile gloves and mask. Skin site was marked, then prepped with Betadine, draped in usual sterile fashion, and infiltrated locally with 1% lidocaine . A 20 gauge spinal needle advanced into the thecal sac at L5-S1 from a right interlaminar approach. Clear colorless CSF spontaneously returned, with opening pressure of 13 cm water. 15 ml CSF were collected and divided among 4 sterile vials for the requested laboratory studies. The needle was then removed. The patient tolerated the procedure well and there were no complications. FLUOROSCOPY: Radiation Exposure Index (as provided by the fluoroscopic device): 2.7 mGy Kerma IMPRESSION: Technically successful lumbar puncture under fluoroscopy. This exam was performed by [facility] PA-C, and was supervised and interpreted by Dr. Ree Molt. Electronically Signed   By: Ree Molt M.D.   On: 09/10/2024 14:00   EEG adult Result Date: 09/10/2024 Shelton Arlin KIDD, MD     09/10/2024  7:23 AM Patient Name: Noah Burnett MRN: [id number] Epilepsy Attending: Arlin KIDD Shelton Referring Physician/Provider: Voncile Isles, MD Date: 09/10/2024 Duration: 25.59 mins Patient history: 83yo M with ams. EEG to evaluate for seizure Level of alertness: Awake, asleep AEDs during EEG study: None Technical aspects: This EEG study was done with scalp electrodes positioned according to the 10-20 International system of electrode placement. Electrical  activity was reviewed with band pass filter of 1-70Hz , sensitivity of 7 uV/mm, display speed of 77mm/sec with a 60Hz  notched filter applied as appropriate. EEG data were recorded continuously and digitally stored.  Video monitoring was available and reviewed as appropriate. Description: The posterior dominant rhythm consists of 8-9Hz  activity of moderate voltage (25-35  uV) seen predominantly in posterior head regions, symmetric and reactive to eye opening and eye closing. Sleep was characterized by vertex waves, sleep spindles (12 to 14 Hz), maximal frontocentral region. There is intermittent generalized 3 to 6 Hz theta-delta slowing. Hyperventilation and photic stimulation were not performed.   ABNORMALITY - Intermittent slow, generalized IMPRESSION: This study is suggestive of diffuse cerebral dysfunction (encephalopathy). No seizures or epileptiform discharges were seen throughout the recording. Priyanka MALVA Krebs   CT ANGIO HEAD NECK W WO CM Result Date: 09/10/2024 EXAM: CTA Head and Neck with Intravenous Contrast. CT Head without Contrast. CLINICAL HISTORY: Neuro deficit, acute, stroke suspected. TECHNIQUE: Axial CTA images of the head and neck performed with intravenous contrast. MIP reconstructed images were created and reviewed. Axial computed tomography images of the head/brain performed without intravenous contrast. Note: Per PQRS, the description of internal carotid artery percent stenosis, including 0 percent or normal exam, is based on [employer] American Symptomatic Carotid Endarterectomy Trial (NASCET) criteria. Dose reduction technique was used including one or more of the following: automated exposure control, adjustment of mA and kV according to patient size, and/or iterative reconstruction. CONTRAST: Without and with; 75 mL (iohexol  (OMNIPAQUE ) 350 MG/ML injection 75 mL IOHEXOL  350 MG/ML SOLN). COMPARISON: MRI from 09/09/2024 and CT from 09/08/2024. FINDINGS: CT HEAD: BRAIN: Changes of atrophy with  chronic microvascular ischemic disease noted. Previously identified parenchymal changes are not well visualized by CT. No acute intracranial hemorrhage. No further acute large vessel territory infarct. No mass lesion or midline shift. No hydrocephalus or extra-axial fluid collection. Calcified atherosclerosis about the skull base. VENTRICLES: No hydrocephalus. ORBITS: Prior bilateral ocular lens replacement. SINUSES AND MASTOIDS: The paranasal sinuses and mastoid air cells are clear. CTA NECK: COMMON CAROTID ARTERIES: Atherosclerotic change about the right carotid bulb with 45% stenosis by NASCET criteria. Atherosclerotic change about the left carotid bulb with up to 55% by NASCET criteria. No dissection or occlusion. INTERNAL CAROTID ARTERIES: Atheromatous change present about the carotid siphons bilaterally, right worse than left. There is resultant moderate stenosis at the periclinoid right ICA (series 11, image 108). No significant stenosis about the left siphon. 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 No dissection or occlusion. VERTEBRAL ARTERIES: Both vertebral arteries were also noted. Left vertebral artery dominant. Atherosclerotic plaque at the origin of the right vertebral artery without significant stenosis. No dissection or occlusion. SUBCLAVIAN ARTERIES: Subclavian arteries noted. CTA HEAD: ANTERIOR CEREBRAL ARTERIES: A1 segments patent bilaterally. Normal anterior communicating artery complex. Both ACAs patent without significant stenosis. No occlusion. No aneurysm. MIDDLE CEREBRAL ARTERIES: No M1 stenosis. No proximal MCA branch occlusion or high-grade stenosis. Distal middle cerebral branches perfusion is symmetric. No significant beading or irregularity. No occlusion. No aneurysm. POSTERIOR CEREBRAL ARTERIES: Atheromatous irregularity about the V4 segments without significant stenosis. Both PICAs patent. Basilar patent without  stenosis. Superior cerebellar and posterior cerebral arteries patent bilaterally. No beading or irregularity. No occlusion. No aneurysm. BASILAR ARTERY: Basilar patent without stenosis. No occlusion. No aneurysm. OTHER: SOFT TISSUES: No acute finding. No masses or lymphadenopathy. Upper lobe dominant centrilobular emphysema. 8 mm irregular pulmonary nodule present at the right upper lobe (series 7, image 21). Mild aortic atherosclerosis. BONES: No worrisome osseous lesions. Exaggeration of the normal upper thoracic kyphosis. Patient is edentulous. Calcified atherosclerosis about the skull base. Major dural sinuses are grossly patent, allowing for timing of contrast bolus. IMPRESSION: 1. Stable head CT. Previously identified parenchymal changes identified on prior MRI  are not well visualized by CT. No other acute intracranial abnormality. 2. Negative CTA for large vessel occlusion or other emergent findings. No CTA evidence for active vasculitis. 3. Atherosclerotic disease about the carotid bifurcations bilaterally with 45% stenosis on the right and 55% stenosis on the left. 4. Moderate atheromatous stenosis at the paraclinoid right ICA. 5. 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 6. Emphysema. 7. 8 mm irregular right upper lobe pulmonary nodule detected on incomplete chest nation CT. Follow-up examination with prompt chest CT recommended for further evaluation. Electronically signed by: Morene Hoard MD 09/10/2024 01:24 AM EDT RP Workstation: [id number]   MR BRAIN W WO CONTRAST Result Date: 09/09/2024 EXAM: MRI BRAIN WITH AND WITHOUT CONTRAST 09/09/2024 09:54:31 AM TECHNIQUE: Multiplanar multisequence MRI of the head/brain was performed with and without the administration of 5 mL gadobutrol (GADAVIST) 1 MMOL/ML intravenous contrast. COMPARISON: None available. CLINICAL HISTORY: Mental status change, unknown cause; confusion in patient with hx of shingles one month ago. FINDINGS: BRAIN AND  VENTRICLES: There are small areas of diffusion abnormality at the anterior left insula, both frontal opercula, and in multiple locations of both corona radiata. Some of the lesions appear more acute than others based on lower ADC values. Multifocal hyperintense T2-weighted signal within the cerebral white matter, most commonly due to chronic small vessel disease. There are additional areas of white matter hyperintense T2-weighted signal with a more focal and rounded appearance than expected with the setting of chronic small vessel disease. Some of these areas also show mild contrast enhancement. Intrinsic hyperintense T1-weighted signal at the right parietooccipital sulcus. On the coronal T1-weighted postcontrast sequence, there is multifocal subcortical contrast enhancement greatest at the right parietooccipital fissure but present at multiple subcortical locations in both hemispheres. No acute intracranial hemorrhage. No mass effect or midline shift. No hydrocephalus. The sella is unremarkable. Mild volume loss. Normal flow voids. ORBITS: OK No acute abnormality. SINUSES: No acute abnormality. BONES AND SOFT TISSUES: Normal bone marrow signal and enhancement. No acute soft tissue abnormality. IMPRESSION: 1. Multifocal subcortical contrast enhancement, greatest at the right parietooccipital fissure but present at multiple subcortical locations in both hemispheres. 2. Small areas of diffusion abnormality at the anterior left insula, left frontal operculum, right frontal operculum, and in multiple locations of both corona radiata, with some lesions appearing more acute than others based on lower ADC values. 3. There are multiple possibilities raised by the above findings. Leading considerations are infectious/inflammatory encephalitis or vasculitis, and CSF sampling should be considered. Repeated embolic showers with scattered infarcts of different ages is also possible. Demyelinating disease is possible, but less  likely. Electronically signed by: Franky Stanford MD 09/09/2024 11:32 AM EDT RP Workstation: [id number]   US  EKG SITE RITE Result Date: 09/08/2024 If Site Rite image not attached, placement could not be confirmed due to current cardiac rhythm.      Elgie Butter M.D. [facility] 09/10/2024, 3:12 PM  Available via Epic secure chat 7am-7pm After 7 pm, please refer to night coverage provider listed on amion.

## 2024-09-10 NOTE — Plan of Care (Signed)
 Problem: Education: Goal: Knowledge of General Education information will improve Description: Including pain rating scale, medication(s)/side effects and non-pharmacologic comfort measures Outcome: Progressing   Problem: Health Behavior/Discharge Planning: Goal: Ability to manage health-related needs will improve Outcome: Progressing   Problem: Clinical Measurements: Goal: Ability to maintain clinical measurements within normal limits will improve Outcome: Progressing Goal: Will remain free from infection Outcome: Progressing Goal: Diagnostic test results will improve Outcome: Progressing Goal: Respiratory complications will improve Outcome: Progressing Goal: Cardiovascular complication will be avoided Outcome: Progressing   Problem: Activity: Goal: Risk for activity intolerance will decrease Outcome: Progressing   Problem: Nutrition: Goal: Adequate nutrition will be maintained Outcome: Progressing   Problem: Coping: Goal: Level of anxiety will decrease Outcome: Progressing   Problem: Elimination: Goal: Will not experience complications related to bowel motility Outcome: Progressing Goal: Will not experience complications related to urinary retention Outcome: Progressing   Problem: Pain Managment: Goal: General experience of comfort will improve and/or be controlled Outcome: Progressing   Problem: Safety: Goal: Ability to remain free from injury will improve Outcome: Progressing   Problem: Skin Integrity: Goal: Risk for impaired skin integrity will decrease Outcome: Progressing   Problem: Nutrition Goal: Patient maintains adequate hydration Outcome: Progressing Goal: Patient maintains weight Outcome: Progressing Goal: Patient/Family demonstrates understanding of diet Outcome: Progressing Goal: Patient/Family independently completes tube feeding Outcome: Progressing Goal: Patient will have no more than 5 lb weight change during LOS Outcome:  Progressing Goal: Patient will utilize adaptive techniques to administer nutrition Outcome: Progressing Goal: Patient will verbalize dietary restrictions Outcome: Progressing   Problem: Education: Goal: Ability to describe self-care measures that may prevent or decrease complications (Diabetes Survival Skills Education) will improve Outcome: Progressing Goal: Individualized Educational Video(s) Outcome: Progressing   Problem: Coping: Goal: Ability to adjust to condition or change in health will improve Outcome: Progressing   Problem: Fluid Volume: Goal: Ability to maintain a balanced intake and output will improve Outcome: Progressing   Problem: Health Behavior/Discharge Planning: Goal: Ability to identify and utilize available resources and services will improve Outcome: Progressing Goal: Ability to manage health-related needs will improve Outcome: Progressing   Problem: Metabolic: Goal: Ability to maintain appropriate glucose levels will improve Outcome: Progressing   Problem: Nutritional: Goal: Maintenance of adequate nutrition will improve Outcome: Progressing Goal: Progress toward achieving an optimal weight will improve Outcome: Progressing   Problem: Skin Integrity: Goal: Risk for impaired skin integrity will decrease Outcome: Progressing   Problem: Tissue Perfusion: Goal: Adequacy of tissue perfusion will improve Outcome: Progressing   Problem: Education: Goal: Knowledge of disease or condition will improve Outcome: Progressing Goal: Knowledge of secondary prevention will improve (MUST DOCUMENT ALL) Outcome: Progressing Goal: Knowledge of patient specific risk factors will improve (DELETE if not current risk factor) Outcome: Progressing   Problem: Ischemic Stroke/TIA Tissue Perfusion: Goal: Complications of ischemic stroke/TIA will be minimized Outcome: Progressing   Problem: Coping: Goal: Will verbalize positive feelings about self Outcome:  Progressing Goal: Will identify appropriate support needs Outcome: Progressing   Problem: Health Behavior/Discharge Planning: Goal: Ability to manage health-related needs will improve Outcome: Progressing Goal: Goals will be collaboratively established with patient/family Outcome: Progressing   Problem: Self-Care: Goal: Ability to participate in self-care as condition permits will improve Outcome: Progressing Goal: Verbalization of feelings and concerns over difficulty with self-care will improve Outcome: Progressing Goal: Ability to communicate needs accurately will improve Outcome: Progressing   Problem: Nutrition: Goal: Risk of aspiration will decrease Outcome: Progressing Goal: Dietary intake will improve Outcome: Progressing

## 2024-09-11 DIAGNOSIS — Z7189 Other specified counseling: Secondary | ICD-10-CM | POA: Diagnosis not present

## 2024-09-11 DIAGNOSIS — E43 Unspecified severe protein-calorie malnutrition: Secondary | ICD-10-CM | POA: Insufficient documentation

## 2024-09-11 DIAGNOSIS — R5381 Other malaise: Secondary | ICD-10-CM

## 2024-09-11 DIAGNOSIS — K319 Disease of stomach and duodenum, unspecified: Secondary | ICD-10-CM

## 2024-09-11 DIAGNOSIS — G9340 Encephalopathy, unspecified: Secondary | ICD-10-CM | POA: Diagnosis not present

## 2024-09-11 DIAGNOSIS — Z515 Encounter for palliative care: Secondary | ICD-10-CM | POA: Diagnosis not present

## 2024-09-11 DIAGNOSIS — K311 Adult hypertrophic pyloric stenosis: Secondary | ICD-10-CM | POA: Diagnosis not present

## 2024-09-11 DIAGNOSIS — I10 Essential (primary) hypertension: Secondary | ICD-10-CM | POA: Diagnosis not present

## 2024-09-11 DIAGNOSIS — E871 Hypo-osmolality and hyponatremia: Secondary | ICD-10-CM | POA: Diagnosis not present

## 2024-09-11 DIAGNOSIS — R627 Adult failure to thrive: Secondary | ICD-10-CM | POA: Diagnosis not present

## 2024-09-11 LAB — MAGNESIUM: Magnesium: 1.8 mg/dL (ref 1.7–2.4)

## 2024-09-11 LAB — CBC WITH DIFFERENTIAL/PLATELET
Abs Immature Granulocytes: 0.03 K/uL (ref 0.00–0.07)
Basophils Absolute: 0 K/uL (ref 0.0–0.1)
Basophils Relative: 1 %
Eosinophils Absolute: 0.1 K/uL (ref 0.0–0.5)
Eosinophils Relative: 2 %
HCT: 37.5 % — ABNORMAL LOW (ref 39.0–52.0)
Hemoglobin: 13.4 g/dL (ref 13.0–17.0)
Immature Granulocytes: 1 %
Lymphocytes Relative: 18 %
Lymphs Abs: 1.1 K/uL (ref 0.7–4.0)
MCH: 30.5 pg (ref 26.0–34.0)
MCHC: 35.7 g/dL (ref 30.0–36.0)
MCV: 85.4 fL (ref 80.0–100.0)
Monocytes Absolute: 0.7 K/uL (ref 0.1–1.0)
Monocytes Relative: 11 %
Neutro Abs: 4.2 K/uL (ref 1.7–7.7)
Neutrophils Relative %: 67 %
Platelets: 183 K/uL (ref 150–400)
RBC: 4.39 MIL/uL (ref 4.22–5.81)
RDW: 16.1 % — ABNORMAL HIGH (ref 11.5–15.5)
WBC: 6.1 K/uL (ref 4.0–10.5)
nRBC: 0 % (ref 0.0–0.2)

## 2024-09-11 LAB — CREATININE, URINE, RANDOM: Creatinine, Urine: 120 mg/dL

## 2024-09-11 LAB — COMPREHENSIVE METABOLIC PANEL WITH GFR
ALT: 9 U/L (ref 0–44)
AST: 12 U/L — ABNORMAL LOW (ref 15–41)
Albumin: 2.6 g/dL — ABNORMAL LOW (ref 3.5–5.0)
Alkaline Phosphatase: 58 U/L (ref 38–126)
Anion gap: 11 (ref 5–15)
BUN: 12 mg/dL (ref 8–23)
CO2: 21 mmol/L — ABNORMAL LOW (ref 22–32)
Calcium: 7.9 mg/dL — ABNORMAL LOW (ref 8.9–10.3)
Chloride: 93 mmol/L — ABNORMAL LOW (ref 98–111)
Creatinine, Ser: 0.67 mg/dL (ref 0.61–1.24)
GFR, Estimated: >60 mL/min (ref >60)
Glucose, Bld: 85 mg/dL (ref 70–99)
Potassium: 3.6 mmol/L (ref 3.5–5.1)
Sodium: 125 mmol/L — ABNORMAL LOW (ref 135–145)
Total Bilirubin: 1.6 mg/dL — ABNORMAL HIGH (ref 0.0–1.2)
Total Protein: 4.7 g/dL — ABNORMAL LOW (ref 6.5–8.1)

## 2024-09-11 LAB — C-REACTIVE PROTEIN: CRP: 7.2 mg/dL — ABNORMAL HIGH (ref <1.0)

## 2024-09-11 LAB — PROCALCITONIN: Procalcitonin: <0.1 ng/mL

## 2024-09-11 LAB — SODIUM: Sodium: 126 mmol/L — ABNORMAL LOW (ref 135–145)

## 2024-09-11 LAB — OSMOLALITY: Osmolality: 257 mosm/kg — ABNORMAL LOW (ref 275–295)

## 2024-09-11 LAB — NA AND K (SODIUM & POTASSIUM), RAND UR
Potassium Urine: 33 mmol/L
Sodium, Ur: 64 mmol/L

## 2024-09-11 MED ORDER — LACTATED RINGERS IV SOLN: INTRAVENOUS | Status: DC

## 2024-09-11 MED ORDER — SODIUM CHLORIDE 1 G PO TABS
1.0000 g | ORAL_TABLET | Freq: Two times a day (BID) | ORAL | Status: DC
  Administered 2024-09-11 – 2024-09-12 (×2): 1 g via ORAL
  Filled 2024-09-11 (×2): qty 1

## 2024-09-11 MED ORDER — CYANOCOBALAMIN 1000 MCG/ML IJ SOLN
1000.0000 ug | Freq: Once | INTRAMUSCULAR | Status: AC
  Administered 2024-09-11: 1000 ug via INTRAMUSCULAR
  Filled 2024-09-11: qty 1

## 2024-09-11 MED ORDER — LOPERAMIDE HCL 2 MG PO CAPS
2.0000 mg | ORAL_CAPSULE | ORAL | Status: DC | PRN
Start: 2024-09-11 — End: 2024-09-21
  Administered 2024-09-11: 2 mg via ORAL
  Filled 2024-09-11: qty 1

## 2024-09-11 MED ORDER — SODIUM CHLORIDE 0.9 % IV SOLN
INTRAVENOUS | Status: AC
Start: 2024-09-11 — End: 2024-09-11

## 2024-09-11 MED ORDER — FUROSEMIDE 20 MG PO TABS
20.0000 mg | ORAL_TABLET | Freq: Once | ORAL | Status: AC
  Administered 2024-09-11: 20 mg via ORAL
  Filled 2024-09-11: qty 1

## 2024-09-11 MED ORDER — VITAMIN B-12 100 MCG PO TABS
500.0000 ug | ORAL_TABLET | Freq: Every day | ORAL | Status: DC
  Administered 2024-09-12 – 2024-09-21 (×7): 500 ug via ORAL
  Filled 2024-09-11 (×9): qty 5

## 2024-09-11 MED ORDER — SODIUM CHLORIDE 0.9 % IV SOLN: INTRAVENOUS | Status: DC

## 2024-09-11 NOTE — Consult Note (Signed)
 Reason for Consult: Hyponatremia Referring Physician: Dr. Cherlyn  Chief Complaint: Failure to thrive  Assessment/Plan: Hyponatremia - with a chronic component with SNa running at baseline in the 126-129 range. Initial TSH 3.7 but repeat 6.2 FT4 nl., Ur Na 137 and urine osm 490. 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  - Will check urine Na, K,  Cr - Start NaCl tablets as well; need solute to excrete free water. We can try low dose Lasix to decrease concentration gradient as well.  - If he drops below 120 will need 3% but trying to avoid.  - Will follow closely with you  Hypertension - BP actually on the lower side Failure to thrive - present for over a month AMS -MRI of the brain showed areas of subcortical enhancement in the parietal occipital lobe seen by neurology, LP sent as well. Neurology treating for viral encephalitis empirically.   H/o prostate cancer H/o tobacco use as well    HPI: Noah Burnett is an 83 y.o. 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  She had a robotic gastrojejunostomy on 8/25 requiring TPN via PICC subsequently.  Patient reportedly has had decreased appetite, weight loss and inability to walk.  Reportedly repeat MRI which did not show the duodenal mass.  Patient brought in for failure to thrive as well as a unwitnessed fall. CT head was negative for large vessel occlusion, no e/o vasculitis, no other acute intracranial abnormality. Daughter denies any NSAID use but he has not been eating much at all for at least a  month. He also denies nausea, cough, sob, headaches.  ROS Pertinent items are noted in HPI.  Chemistry and CBC: Creatinine, Ser  Date/Time Value Ref Range Status  09/11/2024 02:58 AM 0.67 0.61 - 1.24 mg/dL Final  88/97/7974 97:44 AM 0.73 0.61 - 1.24 mg/dL Final  88/98/7974 96:66 AM 0.67 0.61 - 1.24 mg/dL Final  89/68/7974 87:70 PM 0.74 0.61 - 1.24 mg/dL Final  89/78/7974 95:93 PM 0.83 0.61 - 1.24 mg/dL Final  91/87/7974 94:53 PM 0.79 0.61 - 1.24 mg/dL Final  96/74/7978 90:52 AM 0.70 0.61 - 1.24 mg/dL Final  94/80/7980 89:96 AM 0.72 0.61 - 1.24 mg/dL Final   Recent Labs  Lab 09/08/24 1229 09/09/24 0333 09/10/24 0255 09/11/24 0258  NA 128* 128* 127* 125*  K 3.4* 3.7 4.1 3.6  CL 90* 92* 91* 93*  CO2 29 22 24  21*  GLUCOSE 96 81 95 85  BUN 11 9 9 12   CREATININE 0.74 0.67 0.73 0.67  CALCIUM 8.7* 8.6* 8.3* 7.9*  PHOS  --   --  3.6  --    Recent Labs  Lab 09/08/24 1229 09/09/24 0333 09/10/24 0255 09/11/24 0258  WBC 7.5 10.1 8.5 6.1  NEUTROABS 4.9  --  6.2 4.2  HGB 14.2 16.0 14.6 13.4  HCT 41.5 48.2 42.1 37.5*  MCV 88.7 91.6 87.3 85.4  PLT 229 199 214 183   Liver Function Tests: Recent Labs  Lab 09/08/24 1229 09/10/24 0255 09/11/24 0258  AST 14* 14* 12*  ALT 12 11 9   ALKPHOS 88 65  58  BILITOT 0.8 1.1 1.6*  PROT 6.0* 5.6* 4.7*  ALBUMIN 4.0 3.0* 2.6*   Recent Labs  Lab 09/08/24 1229  LIPASE 27   No results for input(s): AMMONIA in the last 168 hours. Cardiac Enzymes: Recent Labs  Lab 09/10/24 0255  CKTOTAL 30*   Iron Studies: No results for input(s): IRON, TIBC, TRANSFERRIN, FERRITIN in the last 72 hours. PT/INR: @LABRCNTIP (inr:5)  Xrays/Other Studies: ) Results for orders placed or performed during the hospital encounter of 09/08/24 (from the past 48 hours)  Glucose, capillary     Status: Abnormal   Collection Time: 09/09/24 12:01 PM  Result Value Ref Range   Glucose-Capillary 68 (L) 70 - 99 mg/dL    Comment: Glucose reference range  applies only to samples taken after fasting for at least 8 hours.  Glucose, capillary     Status: Abnormal   Collection Time: 09/09/24 12:42 PM  Result Value Ref Range   Glucose-Capillary 141 (H) 70 - 99 mg/dL    Comment: Glucose reference range applies only to samples taken after fasting for at least 8 hours.  Vitamin B12     Status: None   Collection Time: 09/09/24  6:20 PM  Result Value Ref Range   Vitamin B-12 267 180 - 914 pg/mL    Comment: (NOTE) This assay is not validated for testing neonatal or myeloproliferative syndrome specimens for Vitamin B12 levels. Performed at [facility], [address], [place], [place] [address]   TSH     Status: Abnormal   Collection Time: 09/09/24  6:20 PM  Result Value Ref Range   TSH 4.632 (H) 0.350 - 4.500 uIU/mL    Comment: Performed by a 3rd Generation assay with a functional sensitivity of <=0.01 uIU/mL. Performed at [facility], [address] [place], [place] [address]   Cortisol-am, blood     Status: None   Collection Time: 09/09/24  6:20 PM  Result Value Ref Range   Cortisol - AM 12.5 6.7 - 22.6 ug/dL    Comment: Performed at [facility] Lab, [address], [place], [place] [address]  T4, free     Status: None   Collection Time: 09/09/24  6:20 PM  Result Value Ref Range   Free T4 0.92 0.61 - 1.12 ng/dL    Comment: (NOTE) Biotin ingestion may interfere with free T4 tests. If the results are inconsistent with the TSH level, previous test results, or the clinical presentation, then consider biotin interference. If needed, order repeat testing after stopping biotin. Performed at [facility], [address] [place], [place] [address]   Phosphorus     Status: None   Collection Time: 09/10/24  2:55 AM  Result Value Ref Range   Phosphorus 3.6 2.5 - 4.6 mg/dL    Comment: Performed at [facility], [address], [place], [place] [address]  CK     Status: Abnormal   Collection Time: 09/10/24  2:55  AM  Result Value Ref Range   Total CK 30 (L) 49 - 397 U/L    Comment: Performed at [facility], [address], [place], [place] [address]  CBC with Differential/Platelet     Status: Abnormal   Collection Time: 09/10/24  2:55 AM  Result Value Ref Range   WBC 8.5 4.0 - 10.5 K/uL   RBC 4.82 4.22 - 5.81 MIL/uL   Hemoglobin 14.6 13.0 - 17.0 g/dL   HCT 57.8 60.9 - 47.9 %  MCV 87.3 80.0 - 100.0 fL   MCH 30.3 26.0 - 34.0 pg   MCHC 34.7 30.0 - 36.0 g/dL   RDW 83.3 (H) 88.4 - 84.4 %   Platelets 214 150 - 400 K/uL   nRBC 0.0 0.0 - 0.2 %   Neutrophils Relative % 73 %   Neutro Abs 6.2 1.7 - 7.7 K/uL   Lymphocytes Relative 14 %   Lymphs Abs 1.2 0.7 - 4.0 K/uL   Monocytes Relative 9 %   Monocytes Absolute 0.7 0.1 - 1.0 K/uL   Eosinophils Relative 3 %   Eosinophils Absolute 0.3 0.0 - 0.5 K/uL   Basophils Relative 0 %   Basophils Absolute 0.0 0.0 - 0.1 K/uL   Immature Granulocytes 1 %   Abs Immature Granulocytes 0.04 0.00 - 0.07 K/uL    Comment: Performed at [facility], [address] [place], [place] [address]  Comprehensive metabolic panel with GFR     Status: Abnormal   Collection Time: 09/10/24  2:55 AM  Result Value Ref Range   Sodium 127 (L) 135 - 145 mmol/L   Potassium 4.1 3.5 - 5.1 mmol/L   Chloride 91 (L) 98 - 111 mmol/L   CO2 24 22 - 32 mmol/L   Glucose, Bld 95 70 - 99 mg/dL    Comment: Glucose reference range applies only to samples taken after fasting for at least 8 hours.   BUN 9 8 - 23 mg/dL   Creatinine, Ser 9.26 0.61 - 1.24 mg/dL   Calcium 8.3 (L) 8.9 - 10.3 mg/dL   Total Protein 5.6 (L) 6.5 - 8.1 g/dL   Albumin 3.0 (L) 3.5 - 5.0 g/dL   AST 14 (L) 15 - 41 U/L   ALT 11 0 - 44 U/L   Alkaline Phosphatase 65 38 - 126 U/L   Total Bilirubin 1.1 0.0 - 1.2 mg/dL   GFR, Estimated >39 >39 mL/min    Comment: (NOTE) Calculated using the CKD-EPI Creatinine Equation (2021)    Anion gap 12 5 - 15    Comment: Performed at [facility] Lab, [address],  [place], [place] [address]  Magnesium     Status: None   Collection Time: 09/10/24  2:55 AM  Result Value Ref Range   Magnesium 1.7 1.7 - 2.4 mg/dL    Comment: Performed at [facility], [address], [place], [place] [address]  Procalcitonin     Status: None   Collection Time: 09/10/24  2:55 AM  Result Value Ref Range   Procalcitonin <0.10 ng/mL    Comment:        Interpretation: PCT (Procalcitonin) <= 0.5 ng/mL: Systemic infection (sepsis) is not likely. Local bacterial infection is possible. (NOTE)       Sepsis PCT Algorithm           Lower Respiratory Tract                                      Infection PCT Algorithm    ----------------------------     ----------------------------         PCT < 0.25 ng/mL                PCT < 0.10 ng/mL          Strongly encourage             Strongly discourage   discontinuation  of antibiotics    initiation of antibiotics    ----------------------------     -----------------------------       PCT 0.25 - 0.50 ng/mL            PCT 0.10 - 0.25 ng/mL               OR       >80% decrease in PCT            Discourage initiation of                                            antibiotics      Encourage discontinuation           of antibiotics    ----------------------------     -----------------------------         PCT >= 0.50 ng/mL              PCT 0.26 - 0.50 ng/mL               AND        <80% decrease in PCT             Encourage initiation of                                             antibiotics       Encourage continuation           of antibiotics    ----------------------------     -----------------------------        PCT >= 0.50 ng/mL                  PCT > 0.50 ng/mL               AND         increase in PCT                  Strongly encourage                                      initiation of antibiotics    Strongly encourage escalation           of antibiotics                                     -----------------------------                                            PCT <= 0.25 ng/mL                                                 OR                                        >  80% decrease in PCT                                      Discontinue / Do not initiate                                             antibiotics  Performed at [facility], [address], [place], [place] [address]   C-reactive protein     Status: Abnormal   Collection Time: 09/10/24  2:55 AM  Result Value Ref Range   CRP 4.1 (H) <1.0 mg/dL    Comment: Performed at [facility], [address], [place], [place] [address]  Protime-INR     Status: None   Collection Time: 09/10/24  2:55 AM  Result Value Ref Range   Prothrombin Time 14.2 11.4 - 15.2 seconds   INR 1.0 0.8 - 1.2    Comment: (NOTE) INR goal varies based on device and disease states. Performed at [facility], [address] [place], [place] [address]   Lipid panel     Status: None   Collection Time: 09/10/24  2:55 AM  Result Value Ref Range   Cholesterol 130 0 - 200 mg/dL   Triglycerides 55 <849 mg/dL   HDL 44 >59 mg/dL   Total CHOL/HDL Ratio 3.0 RATIO   VLDL 11 0 - 40 mg/dL   LDL Cholesterol 75 0 - 99 mg/dL    Comment:        Total Cholesterol/HDL:CHD Risk Coronary Heart Disease Risk Table                     Men   Women  1/2 Average Risk   3.4   3.3  Average Risk       5.0   4.4  2 X Average Risk   9.6   7.1  3 X Average Risk  23.4   11.0        Use the calculated Patient Ratio above and the CHD Risk Table to determine the patient's CHD Risk.        ATP III CLASSIFICATION (LDL):  <100     mg/dL   Optimal  899-870  mg/dL   Near or Above                    Optimal  130-159  mg/dL   Borderline  839-810  mg/dL   High  >809     mg/dL   Very High Performed at [facility] Lab, [address], [place], [place] [address]   Hemoglobin A1c     Status: None   Collection Time: 09/10/24  2:55 AM  Result Value Ref Range   Hgb A1c MFr Bld 5.6  4.8 - 5.6 %    Comment: (NOTE) Diagnosis of Diabetes The following HbA1c ranges recommended by the American Diabetes Association (ADA) may be used as an aid in the diagnosis of diabetes mellitus.  Hemoglobin             Suggested A1C NGSP%              Diagnosis  <5.7                   Non  Diabetic  5.7-6.4                Pre-Diabetic  >6.4                   Diabetic  <7.0                   Glycemic control for                       adults with diabetes.     Mean Plasma Glucose 114.02 mg/dL    Comment: Performed at [facility], [address] [place], [place] [address]  TSH     Status: Abnormal   Collection Time: 09/10/24  2:55 AM  Result Value Ref Range   TSH 6.172 (H) 0.350 - 4.500 uIU/mL    Comment: Performed by a 3rd Generation assay with a functional sensitivity of <=0.01 uIU/mL. Performed at [facility], [address], [place], [place] [address]   CSF culture w Gram Stain     Status: None (Preliminary result)   Collection Time: 09/10/24  7:10 AM   Specimen: CSF; Cerebrospinal Fluid  Result Value Ref Range   Specimen Description CSF    Special Requests NONE    Gram Stain      WBC PRESENT, PREDOMINANTLY MONONUCLEAR NO ORGANISMS SEEN CYTOSPIN SMEAR    Culture      NO GROWTH < 24 HOURS Performed at [facility], [address], [place], [place] [address]    Report Status PENDING   Meningitis/Encephalitis Panel (CSF)     Status: None   Collection Time: 09/10/24  7:10 AM  Result Value Ref Range   Cryptococcus neoformans/gattii (CSF) NOT DETECTED NOT DETECTED    Comment: (NOTE) Patients with a suspicion of cryptococcal meningitis should be tested  for cryptococcal antigen (CrAg).      Cytomegalovirus (CSF) NOT DETECTED NOT DETECTED   Enterovirus (CSF) NOT DETECTED NOT DETECTED   Escherichia coli K1 (CSF) NOT DETECTED NOT DETECTED    Comment: (NOTE) Only E. coli strains possessing the K1 capsular antigen will be detected.       Haemophilus influenzae (CSF) NOT DETECTED NOT DETECTED   Herpes simplex virus 1 (CSF) NOT DETECTED NOT DETECTED   Herpes simplex virus 2 (CSF) NOT DETECTED NOT DETECTED   Human herpesvirus 6 (CSF) NOT DETECTED NOT DETECTED   Human parechovirus (CSF) NOT DETECTED NOT DETECTED   Listeria monocytogenes (CSF) NOT DETECTED NOT DETECTED   Neisseria meningitis (CSF) NOT DETECTED NOT DETECTED    Comment: (NOTE) Only encapsulated strains of N. meningitidis will be detected.     Streptococcus agalactiae (CSF) NOT DETECTED NOT DETECTED   Streptococcus pneumoniae (CSF) NOT DETECTED NOT DETECTED   Varicella zoster virus (CSF) NOT DETECTED NOT DETECTED    Comment: Performed at [facility], [address], [place], [place] [address]  Protein and glucose, CSF     Status: Abnormal   Collection Time: 09/10/24 12:45 PM  Result Value Ref Range   Glucose, CSF 44 40 - 70 mg/dL   Total  Protein, CSF 65 (H) 15 - 45 mg/dL    Comment: Performed at [facility], [address], [place], [place] [address]  CSF cell count with differential collection tube #: 1     Status: Abnormal   Collection Time: 09/10/24 12:45 PM  Result Value Ref Range   Tube # 1  Color, CSF COLORLESS COLORLESS   Appearance, CSF CLEAR CLEAR   Supernatant NOT INDICATED    RBC Count, CSF 12 (H) 0 /cu mm   WBC, CSF 2 0 - 5 /cu mm   Other Cells, CSF TOO FEW TO COUNT, SMEAR AVAILABLE FOR REVIEW     Comment: RARE LYMPH, RARE MONO, RARE EOS Performed at [facility], [address], [place], [place] [address]   CSF cell count with differential collection tube #: 4     Status: None   Collection Time: 09/10/24 12:46 PM  Result Value Ref Range   Tube # 4    Color, CSF COLORLESS COLORLESS   Appearance, CSF CLEAR CLEAR   Supernatant NOT INDICATED    RBC Count, CSF 0 0 /cu mm   WBC, CSF 1 0 - 5 /cu mm   Other Cells, CSF TOO FEW TO COUNT, SMEAR AVAILABLE FOR REVIEW     Comment: RARE LYMPHS, RARE EOS, RARE MONO Performed  at [facility], [address], [place], [place] [address]   Sodium, urine, random     Status: None   Collection Time: 09/10/24  3:07 PM  Result Value Ref Range   Sodium, Ur 137 mmol/L    Comment: Performed at [facility], [address] [place], [place] [address]  Osmolality, urine     Status: None   Collection Time: 09/10/24  3:07 PM  Result Value Ref Range   Osmolality, Ur 490 300 - 900 mOsm/kg    Comment: Performed at [facility], [address], [place], [place] [address]  CBC with Differential/Platelet     Status: Abnormal   Collection Time: 09/11/24  2:58 AM  Result Value Ref Range   WBC 6.1 4.0 - 10.5 K/uL   RBC 4.39 4.22 - 5.81 MIL/uL   Hemoglobin 13.4 13.0 - 17.0 g/dL   HCT 62.4 (L) 60.9 - 47.9 %   MCV 85.4 80.0 - 100.0 fL   MCH 30.5 26.0 - 34.0 pg   MCHC 35.7 30.0 - 36.0 g/dL   RDW 83.8 (H) 88.4 - 84.4 %   Platelets 183 150 - 400 K/uL   nRBC 0.0 0.0 - 0.2 %   Neutrophils Relative % 67 %   Neutro Abs 4.2 1.7 - 7.7 K/uL   Lymphocytes Relative 18 %   Lymphs Abs 1.1 0.7 - 4.0 K/uL   Monocytes Relative 11 %   Monocytes Absolute 0.7 0.1 - 1.0 K/uL   Eosinophils Relative 2 %   Eosinophils Absolute 0.1 0.0 - 0.5 K/uL   Basophils Relative 1 %   Basophils Absolute 0.0 0.0 - 0.1 K/uL   Immature Granulocytes 1 %   Abs Immature Granulocytes 0.03 0.00 - 0.07 K/uL    Comment: Performed at [facility], [address] [place], [place] [address]  Comprehensive metabolic panel with GFR     Status: Abnormal   Collection Time: 09/11/24  2:58 AM  Result Value Ref Range   Sodium 125 (L) 135 - 145 mmol/L   Potassium 3.6 3.5 - 5.1 mmol/L   Chloride 93 (L) 98 - 111 mmol/L   CO2 21 (L) 22 - 32 mmol/L   Glucose, Bld 85 70 - 99 mg/dL    Comment: Glucose reference range applies only to samples taken after fasting for at least 8 hours.   BUN 12 8 - 23 mg/dL   Creatinine, Ser 9.32 0.61 - 1.24 mg/dL   Calcium  7.9 (L) 8.9 - 10.3 mg/dL   Total Protein 4.7 (L)  6.5 - 8.1 g/dL   Albumin 2.6 (L) 3.5 - 5.0 g/dL   AST 12 (L) 15 - 41 U/L   ALT 9 0 - 44 U/L   Alkaline Phosphatase 58 38 - 126 U/L   Total Bilirubin 1.6 (H) 0.0 - 1.2 mg/dL   GFR, Estimated >39 >39 mL/min    Comment: (NOTE) Calculated using the CKD-EPI Creatinine Equation (2021)    Anion gap 11 5 - 15    Comment: Performed at [facility], [address], [place], [place] [address]  Magnesium     Status: None   Collection Time: 09/11/24  2:58 AM  Result Value Ref Range   Magnesium 1.8 1.7 - 2.4 mg/dL    Comment: Performed at [facility], [address], [place], [place] [address]  Procalcitonin     Status: None   Collection Time: 09/11/24  2:58 AM  Result Value Ref Range   Procalcitonin <0.10 ng/mL    Comment:        Interpretation: PCT (Procalcitonin) <= 0.5 ng/mL: Systemic infection (sepsis) is not likely. Local bacterial infection is possible. (NOTE)       Sepsis PCT Algorithm           Lower Respiratory Tract                                      Infection PCT Algorithm    ----------------------------     ----------------------------         PCT < 0.25 ng/mL                PCT < 0.10 ng/mL          Strongly encourage             Strongly discourage   discontinuation of antibiotics    initiation of antibiotics    ----------------------------     -----------------------------       PCT 0.25 - 0.50 ng/mL            PCT 0.10 - 0.25 ng/mL               OR       >80% decrease in PCT            Discourage initiation of                                            antibiotics      Encourage discontinuation           of antibiotics    ----------------------------     -----------------------------         PCT >= 0.50 ng/mL              PCT 0.26 - 0.50 ng/mL               AND        <80% decrease in PCT             Encourage initiation of  antibiotics       Encourage continuation           of antibiotics     ----------------------------     -----------------------------        PCT >= 0.50 ng/mL                  PCT > 0.50 ng/mL               AND         increase in PCT                  Strongly encourage                                      initiation of antibiotics    Strongly encourage escalation           of antibiotics                                     -----------------------------                                           PCT <= 0.25 ng/mL                                                 OR                                        > 80% decrease in PCT                                      Discontinue / Do not initiate                                             antibiotics  Performed at [facility], [address] [place], [place] [address]   C-reactive protein     Status: Abnormal   Collection Time: 09/11/24  2:58 AM  Result Value Ref Range   CRP 7.2 (H) <1.0 mg/dL    Comment: Performed at [facility], [address], [place], [place] [address]  Osmolality     Status: Abnormal   Collection Time: 09/11/24  2:58 AM  Result Value Ref Range   Osmolality 257 (L) 275 - 295 mOsm/kg    Comment: Performed at [facility], [address] [place], [place] [address]   ECHOCARDIOGRAM COMPLETE Result Date: 09/10/2024    ECHOCARDIOGRAM REPORT   Patient Name:   Noah Burnett Date of Exam: 09/10/2024 Medical Rec #:  [id number]        Height:       67.0 in Accession #:    [id number]       Weight:  100.5 lb Date of Birth:  1941/06/06        BSA:          1.510 m Patient Age:    58 years         BP:           112/57 mmHg Patient Gender: M                HR:           59 bpm. Exam Location:  Inpatient Procedure: 2D Echo, 3D Echo, Cardiac Doppler, Color Doppler and Strain Analysis            (Both Spectral and Color Flow Doppler were utilized during            procedure). Indications:    Stroke  History:        Patient has no prior history of Echocardiogram examinations.                  Risk Factors:Hypertension.  Sonographer:    Philomena Daring Referring Phys: [id number] ASHISH ARORA  Sonographer Comments: Global longitudinal strain was attempted. IMPRESSIONS  1. Left ventricular ejection fraction, by estimation, is 55%. Left ventricular ejection fraction by 3D volume is 52 %. The left ventricle has normal function. The left ventricle has no regional wall motion abnormalities. Left ventricular diastolic parameters are consistent with Grade I diastolic dysfunction (impaired relaxation). The average left ventricular global longitudinal strain is -13.3 %.  2. Right ventricular systolic function is normal. The right ventricular size is normal. Tricuspid regurgitation signal is inadequate for assessing PA pressure.  3. The mitral valve is normal in structure. Trivial mitral valve regurgitation. No evidence of mitral stenosis.  4. The aortic valve is tricuspid. Aortic valve regurgitation is mild. No aortic stenosis is present.  5. The inferior vena cava is normal in size with greater than 50% respiratory variability, suggesting right atrial pressure of 3 mmHg. Comparison(s): No prior Echocardiogram. FINDINGS  Left Ventricle: Left ventricular ejection fraction, by estimation, is 55%. Left ventricular ejection fraction by 3D volume is 52 %. The left ventricle has normal function. The left ventricle has no regional wall motion abnormalities. The average left ventricular global longitudinal strain is -13.3 %. Strain was performed and the global longitudinal strain is indeterminate. The left ventricular internal cavity size was normal in size. There is no left ventricular hypertrophy. Left ventricular diastolic parameters are consistent with Grade I diastolic dysfunction (impaired relaxation). Normal left ventricular filling pressure. Right Ventricle: The right ventricular size is normal. No increase in right ventricular wall thickness. Right ventricular systolic function is normal. Tricuspid regurgitation  signal is inadequate for assessing PA pressure. Left [facility]: Left atrial size was normal in size. Right [facility]: Right atrial size was normal in size. Pericardium: There is no evidence of pericardial effusion. Mitral Valve: The mitral valve is normal in structure. Trivial mitral valve regurgitation. No evidence of mitral valve stenosis. Tricuspid Valve: The tricuspid valve is normal in structure. Tricuspid valve regurgitation is not demonstrated. No evidence of tricuspid stenosis. Aortic Valve: The aortic valve is tricuspid. Aortic valve regurgitation is mild. No aortic stenosis is present. Pulmonic Valve: The pulmonic valve was not well visualized. Pulmonic valve regurgitation is not visualized. No evidence of pulmonic stenosis. Aorta: The aortic root and ascending aorta are structurally normal, with no evidence of dilitation. Venous: The inferior vena cava is normal in size with greater than 50% respiratory variability, suggesting right atrial pressure of 3 mmHg. IAS/Shunts:  No atrial level shunt detected by color flow Doppler. Additional Comments: 3D was performed not requiring image post processing on an independent workstation and was abnormal.  LEFT VENTRICLE PLAX 2D LVIDd:         4.00 cm         Diastology LVIDs:         2.80 cm         LV e' medial:  4.57 cm/s LV PW:         1.10 cm         LV e' lateral: 7.94 cm/s LV IVS:        1.10 cm LVOT diam:     2.00 cm         2D Longitudinal LV SV:         59              Strain LV SV Index:   39              2D Strain GLS   -13.3 % LVOT Area:     3.14 cm        Avg:                                 3D Volume EF                                LV 3D EF:    Left                                             ventricul                                             ar                                             ejection                                             fraction                                             by 3D                                             volume is                                              52 %.  3D Volume EF:                                3D EF:        52 %                                LV EDV:       84 ml                                LV ESV:       40 ml                                LV SV:        43 ml RIGHT VENTRICLE             IVC RV Basal diam:  2.80 cm     IVC diam: 1.20 cm RV Mid diam:    2.50 cm RV S prime:     12.50 cm/s TAPSE (M-mode): 1.6 cm LEFT ATRIUM             Index        RIGHT ATRIUM          Index LA diam:        3.40 cm 2.25 cm/m   RA Area:     9.48 cm LA Vol (A2C):   35.1 ml 23.25 ml/m  RA Volume:   14.90 ml 9.87 ml/m LA Vol (A4C):   37.9 ml 25.11 ml/m LA Biplane Vol: 38.1 ml 25.24 ml/m  AORTIC VALVE LVOT Vmax:   95.40 cm/s LVOT Vmean:  61.300 cm/s LVOT VTI:    0.188 m  AORTA Ao Root diam: 3.20 cm Ao Asc diam:  3.00 cm MITRAL VALVE MV Area (PHT): 2.48 cm    SHUNTS MV Decel Time: 306 msec    Systemic VTI:  0.19 m MV A velocity: 84.40 cm/s  Systemic Diam: 2.00 cm Vishnu Priya Mallipeddi Electronically signed by Diannah Late Mallipeddi Signature Date/Time: 09/10/2024/4:39:28 PM    Final    DG FL GUIDED LUMBAR PUNCTURE Result Date: 09/10/2024 CLINICAL DATA:  83 year old male with a history of progressively worsening confusion, lethargy, and decreasing oral intake. Noticed to have worsening generalized declined following shingles outbreak. Request for lumbar puncture to rule out HSV encephalitis. EXAM: LUMBAR PUNCTURE UNDER FLUOROSCOPY PROCEDURE: An appropriate skin entry site was determined fluoroscopically. Operator donned sterile gloves and mask. Skin site was marked, then prepped with Betadine, draped in usual sterile fashion, and infiltrated locally with 1% lidocaine . A 20 gauge spinal needle advanced into the thecal sac at L5-S1 from a right interlaminar approach. Clear colorless CSF spontaneously returned, with opening pressure of 13 cm water. 15 ml CSF were collected and divided among 4  sterile vials for the requested laboratory studies. The needle was then removed. The patient tolerated the procedure well and there were no complications. FLUOROSCOPY: Radiation Exposure Index (as provided by the fluoroscopic device): 2.7 mGy Kerma IMPRESSION: Technically successful lumbar puncture under fluoroscopy. This exam was performed by [facility] PA-C, and was supervised and interpreted by Dr. Ree Molt. Electronically Signed   By: Ree Molt M.D.   On: 09/10/2024 14:00   EEG adult Result Date: 09/10/2024  Shelton Arlin KIDD, MD     09/10/2024  7:23 AM Patient Name: JOHNTAE BROXTERMAN MRN: [id number] Epilepsy Attending: Arlin KIDD Shelton Referring Physician/Provider: Voncile Isles, MD Date: 09/10/2024 Duration: 25.59 mins Patient history: 83yo M with ams. EEG to evaluate for seizure Level of alertness: Awake, asleep AEDs during EEG study: None Technical aspects: This EEG study was done with scalp electrodes positioned according to the 10-20 International system of electrode placement. Electrical activity was reviewed with band pass filter of 1-70Hz , sensitivity of 7 uV/mm, display speed of 59mm/sec with a 60Hz  notched filter applied as appropriate. EEG data were recorded continuously and digitally stored.  Video monitoring was available and reviewed as appropriate. Description: The posterior dominant rhythm consists of 8-9Hz  activity of moderate voltage (25-35 uV) seen predominantly in posterior head regions, symmetric and reactive to eye opening and eye closing. Sleep was characterized by vertex waves, sleep spindles (12 to 14 Hz), maximal frontocentral region. There is intermittent generalized 3 to 6 Hz theta-delta slowing. Hyperventilation and photic stimulation were not performed.   ABNORMALITY - Intermittent slow, generalized IMPRESSION: This study is suggestive of diffuse cerebral dysfunction (encephalopathy). No seizures or epileptiform discharges were seen throughout the recording. Priyanka  KIDD Shelton   CT ANGIO HEAD NECK W WO CM Result Date: 09/10/2024 EXAM: CTA Head and Neck with Intravenous Contrast. CT Head without Contrast. CLINICAL HISTORY: Neuro deficit, acute, stroke suspected. TECHNIQUE: Axial CTA images of the head and neck performed with intravenous contrast. MIP reconstructed images were created and reviewed. Axial computed tomography images of the head/brain performed without intravenous contrast. Note: Per PQRS, the description of internal carotid artery percent stenosis, including 0 percent or normal exam, is based on [employer] American Symptomatic Carotid Endarterectomy Trial (NASCET) criteria. Dose reduction technique was used including one or more of the following: automated exposure control, adjustment of mA and kV according to patient size, and/or iterative reconstruction. CONTRAST: Without and with; 75 mL (iohexol  (OMNIPAQUE ) 350 MG/ML injection 75 mL IOHEXOL  350 MG/ML SOLN). COMPARISON: MRI from 09/09/2024 and CT from 09/08/2024. FINDINGS: CT HEAD: BRAIN: Changes of atrophy with chronic microvascular ischemic disease noted. Previously identified parenchymal changes are not well visualized by CT. No acute intracranial hemorrhage. No further acute large vessel territory infarct. No mass lesion or midline shift. No hydrocephalus or extra-axial fluid collection. Calcified atherosclerosis about the skull base. VENTRICLES: No hydrocephalus. ORBITS: Prior bilateral ocular lens replacement. SINUSES AND MASTOIDS: The paranasal sinuses and mastoid air cells are clear. CTA NECK: COMMON CAROTID ARTERIES: Atherosclerotic change about the right carotid bulb with 45% stenosis by NASCET criteria. Atherosclerotic change about the left carotid bulb with up to 55% by NASCET criteria. No dissection or occlusion. INTERNAL CAROTID ARTERIES: Atheromatous change present about the carotid siphons bilaterally, right worse than left. There is resultant moderate stenosis at the periclinoid right ICA (series 11,  image 108). No significant stenosis about the left siphon. 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 No dissection or occlusion. VERTEBRAL ARTERIES: Both vertebral arteries were also noted. Left vertebral artery dominant. Atherosclerotic plaque at the origin of the right vertebral artery without significant stenosis. No dissection or occlusion. SUBCLAVIAN ARTERIES: Subclavian arteries noted. CTA HEAD: ANTERIOR CEREBRAL ARTERIES: A1 segments patent bilaterally. Normal anterior communicating artery complex. Both ACAs patent without significant stenosis. No occlusion. No aneurysm. MIDDLE CEREBRAL ARTERIES: No M1 stenosis. No proximal MCA branch occlusion or high-grade stenosis. Distal middle cerebral branches perfusion is symmetric. No significant  beading or irregularity. No occlusion. No aneurysm. POSTERIOR CEREBRAL ARTERIES: Atheromatous irregularity about the V4 segments without significant stenosis. Both PICAs patent. Basilar patent without stenosis. Superior cerebellar and posterior cerebral arteries patent bilaterally. No beading or irregularity. No occlusion. No aneurysm. BASILAR ARTERY: Basilar patent without stenosis. No occlusion. No aneurysm. OTHER: SOFT TISSUES: No acute finding. No masses or lymphadenopathy. Upper lobe dominant centrilobular emphysema. 8 mm irregular pulmonary nodule present at the right upper lobe (series 7, image 21). Mild aortic atherosclerosis. BONES: No worrisome osseous lesions. Exaggeration of the normal upper thoracic kyphosis. Patient is edentulous. Calcified atherosclerosis about the skull base. Major dural sinuses are grossly patent, allowing for timing of contrast bolus. IMPRESSION: 1. Stable head CT. Previously identified parenchymal changes identified on prior MRI are not well visualized by CT. No other acute intracranial abnormality. 2. Negative CTA for large vessel occlusion or other emergent findings. No CTA  evidence for active vasculitis. 3. Atherosclerotic disease about the carotid bifurcations bilaterally with 45% stenosis on the right and 55% stenosis on the left. 4. Moderate atheromatous stenosis at the paraclinoid right ICA. 5. 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 6. Emphysema. 7. 8 mm irregular right upper lobe pulmonary nodule detected on incomplete chest nation CT. Follow-up examination with prompt chest CT recommended for further evaluation. Electronically signed by: Morene Hoard MD 09/10/2024 01:24 AM EDT RP Workstation: [id number]    PMH:   Past Medical History:  Diagnosis Date   Arthritis    Bladder tumor    Deafness, left    Full dentures    GERD (gastroesophageal reflux disease)    History of [place] spotted fever    01-25-2020  per pt had in 2018 and was treated w/ no residual   Hyperplasia of prostate with lower urinary tract symptoms (LUTS)    Hypertension    followed by pcp   (01-25-2020  per pt had stress test approx. 2005, told normal , done in [place] somewhere)   Pre-diabetes    followed by pcp ,   watches diet   Prostate cancer [facility]) urologist--- dr watt   dx by bx01-13-2020,  Gleason 3+4 ;  MRI 12-19-2018, vol 96.26;   active survillance   Shingles    Wears glasses    Wears hearing aid in right ear     PSH:   Past Surgical History:  Procedure Laterality Date   SHOULDER ARTHROSCOPY WITH ROTATOR CUFF REPAIR AND SUBACROMIAL DECOMPRESSION Right 05-20-2009  @[facility]    TRANSURETHRAL RESECTION OF BLADDER TUMOR WITH MITOMYCIN -C N/A 02/01/2020   Procedure: TRANSURETHRAL RESECTION OF BLADDER TUMOR;  Surgeon: Watt Rush, MD;  Location[facility];  Service: Urology;  Laterality: N/A;   TRANSURETHRAL RESECTION OF PROSTATE      Allergies:  Allergies  Allergen Reactions   Haemophilus Influenzae Vaccines Other (See Comments)    I get deathly sick    Medications:   Prior to Admission medications   Medication Sig Start  Date End Date Taking? Authorizing Provider  acetaminophen  (TYLENOL ) 500 MG tablet Take 1,000 mg by mouth every 6 (six) hours as needed for headache or mild pain (pain score 1-3).   Yes [provider]  docusate sodium (COLACE) 100 MG capsule Take 100 mg by mouth 2 (two) times daily.   Yes [provider]  hydrochlorothiazide (HYDRODIURIL) 12.5 MG tablet Take 12.5 mg by mouth daily. 02/16/23  Yes [provider]  megestrol (MEGACE) 40 MG/ML suspension Take 400 mg by mouth every  morning. 09/04/24  Yes [provider]  omeprazole (PRILOSEC) 40 MG capsule Take 40 mg by mouth daily. 02/16/23  Yes [provider]  polyethylene glycol (MIRALAX / GLYCOLAX) 17 g packet Take 17 g by mouth daily.   Yes [provider]  valACYclovir (VALTREX) 1000 MG tablet Take 1,000 mg by mouth 3 (three) times daily. Patient not taking: Reported on 08/29/2024 08/09/24   [provider]    Discontinued Meds:   Medications Discontinued During This Encounter  Medication Reason   bisacodyl (DULCOLAX) 5 MG EC tablet Change in therapy   magnesium hydroxide (MILK OF MAGNESIA) 400 MG/5ML suspension Patient Preference   mirtazapine (REMERON SOL-TAB) disintegrating tablet 15 mg    enoxaparin (LOVENOX) injection 40 mg    lactated ringers  infusion    TPN ADULT (ION)    insulin aspart (novoLOG) injection 0-9 Units    feeding supplement (ENSURE PLUS HIGH PROTEIN) liquid 237 mL    lactated ringers  infusion     Social History:  reports that he quit smoking about 25 years ago. His smoking use included cigarettes. He started smoking about 61 years ago. He has a 36 pack-year smoking history. He has been exposed to tobacco smoke. He has never used smokeless tobacco. He reports that he does not currently use alcohol. He reports that he does not use drugs.  Family History:  History reviewed. No pertinent family history.  Blood pressure (!) 107/55, pulse (!) 48, temperature 98 F  (36.7 C), temperature source Oral, resp. rate 15, height 5' 7 (1.702 m), weight 45.6 kg, SpO2 100%. General appearance: alert and cooperative Head: Normocephalic, without obvious abnormality, atraumatic Neck: no adenopathy, no carotid bruit, no JVD, supple, symmetrical, trachea midline, and thyroid not enlarged, symmetric, no tenderness/mass/nodules Back: symmetric, no curvature. ROM normal. No CVA tenderness. Resp: clear to auscultation bilaterally Cardio: regular rate and rhythm GI: soft, non-tender; bowel sounds normal; no masses,  no organomegaly Extremities: edema none Pulses: 2+ and symmetric       Jolanta Cabeza, LYNWOOD ORN, MD 09/11/2024, 10:07 AM

## 2024-09-11 NOTE — PMR Pre-admission (Signed)
 PMR Admission Coordinator Pre-Admission Assessment  Patient: Noah Burnett is an 83 y.o., male MRN: [id number] DOB: 07-May-1941 Height: 5' 7 (170.2 cm) Weight: 45.6 kg  Insurance Information HMO: yes    PPO: yes     PCP:      IPA:      80/20:      OTHER:  PRIMARY: [employer] Medicare HMO/PPO      Policy#:  [id number]    Subscriber: Pt CM Name: ***      Phone#: ***     Fax#: *** Pre-Cert#: [id number]      Employer:  Benefits:  Phone #:      Name:  Eff. Date: 11/09/20     Deduct: 0      Out of Pocket Max: 5,900 [phone] met)      Life Max: n/a CIR: $300/day for days 1-6      SNF: $10/day Outpatient: $30/ visit      Home Health: $0  DME: 20%      Providers: in network SECONDARY:       Policy#:      Phone#:   Artist:       Phone#:   The Data Processing Manager" for patients in Inpatient Rehabilitation Facilities with attached "Privacy Act Statement-Health Care Records" was provided and verbally reviewed with: Pt.  Emergency Contact Information Contact Information     Name Relation Home Work [place] Daughter [phone]        Other Contacts     Name Relation Home Work [place] Son   [phone]       Current Medical History  Patient Admitting Diagnosis: Encephalopathy History of Present Illness: Noah Burnett is a 83 y.o. 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  This was diagnosed at [facility].   Surgical oncology was consulted and he underwent a robotic gastrojejunostomy on 07/03/24.  He did have a delayed return of bowel function and had a PICC line with TPN for several days until he had advancement of his diet.  Since his procedure, he has had a fairly steady decline with decreasing appetite, significant weight loss, inability to walk. Pt. Began to exhibit confusion and had an unwitnessed fall on 09/08/24.  He was brought in to  the [facility] on 09/08/24 for evaluation.    In the ED, the patient had an abdominal x-ray which showed no evidence of obstruction.  Labs showed chronic and stable hyponatremia.  Kidney function is normal with a creatinine of 0.74.  MRI of the brain shows multiple areas of subcortical enhancement in the parieto-occipital lobe. Differential include encephalitis versus vasculitis versus demyelinating disease. Neurology consulted and patient was transferred from [facility] to [facility] 09/09/24. Patient underwent fluoroscopic guided lumbar puncture on 09/11/2023 and fluid sent for analysis for evaluation of meningitis and encephalitis. Pt. Seen by PT/OT/SLP and they recommend CIR to assist return to PLOF. Complete NIHSS TOTAL: 4  Patient's medical record from [facility] has been reviewed by the rehabilitation admission coordinator and physician.  Past Medical History  Past Medical History:  Diagnosis Date   Arthritis    Bladder tumor    Deafness, left    Full dentures    GERD (gastroesophageal reflux disease)    History of [place] spotted fever    01-25-2020  per pt had in 2018 and was treated w/ no residual  Hyperplasia of prostate with lower urinary tract symptoms (LUTS)    Hypertension    followed by pcp   (01-25-2020  per pt had stress test approx. 2005, told normal , done in [place] somewhere)   Pre-diabetes    followed by pcp ,   watches diet   Prostate cancer [facility]) urologist--- dr watt   dx by bx01-13-2020,  Gleason 3+4 ;  MRI 12-19-2018, vol 96.26;   active survillance   Shingles    Wears glasses    Wears hearing aid in right ear     Has the patient had major surgery during 100 days prior to admission? Yes  Family History   family history is not on file.  Current Medications  Current Facility-Administered Medications:    0.9 %  sodium chloride  infusion, , Intravenous, Continuous, Cherlyn Labella, MD, Last Rate: 100 mL/hr at 09/11/24 1053,  New Bag at 09/11/24 1053   0.9 %  sodium chloride  infusion, , Intravenous, Continuous, Chen, Lydia D, [facility]   acetaminophen  (TYLENOL ) tablet 650 mg, 650 mg, Oral, Q6H PRN, Tat, David, MD   acyclovir (ZOVIRAX) 455 mg in dextrose  5 % 100 mL IVPB, 10 mg/kg, Intravenous, Q12H, Tanda Dempsey SAUNDERS, RPH, Last Rate: 109.1 mL/hr at 09/11/24 1113, 455 mg at 09/11/24 1113   aspirin EC tablet 81 mg, 81 mg, Oral, Daily, Arora, Ashish, MD, 81 mg at 09/11/24 1051   cyanocobalamin (VITAMIN B12) injection 1,000 mcg, 1,000 mcg, Intramuscular, Once, Akula, Vijaya, MD   enoxaparin (LOVENOX) injection 30 mg, 30 mg, Subcutaneous, Q24H, Tat, David, MD, 30 mg at 09/10/24 2033   feeding supplement (BOOST / RESOURCE BREEZE) liquid 1 Container, 1 Container, Oral, TID BM, Tat, David, MD, 1 Container at 09/11/24 1059   multivitamin with minerals tablet 1 tablet, 1 tablet, Oral, Daily, Tat, David, MD, 1 tablet at 09/11/24 1051   ondansetron  (ZOFRAN ) tablet 4 mg, 4 mg, Oral, Q6H PRN **OR** ondansetron  (ZOFRAN ) injection 4 mg, 4 mg, Intravenous, Q6H PRN, Tat, David, MD   pantoprazole  (PROTONIX ) EC tablet 40 mg, 40 mg, Oral, Daily, Tat, David, MD, 40 mg at 09/11/24 1051   polyethylene glycol (MIRALAX / GLYCOLAX) packet 17 g, 17 g, Oral, Daily, Tat, David, MD, 17 g at 09/11/24 1050   rosuvastatin (CRESTOR) tablet 10 mg, 10 mg, Oral, Daily, Arora, Ashish, MD, 10 mg at 09/11/24 1051   senna (SENOKOT) tablet 17.2 mg, 2 tablet, Oral, Daily, Tat, David, MD, 17.2 mg at 09/11/24 1051   sodium chloride  tablet 1 g, 1 g, Oral, BID WC, Melia Lynwood ORN, MD   [START ON 09/12/2024] vitamin B-12 (CYANOCOBALAMIN) tablet 500 mcg, 500 mcg, Oral, Daily, Akula, Vijaya, MD  Patients Current Diet:  Diet Order             Diet regular Room service appropriate? Yes; Fluid consistency: Thin  Diet effective now                   Precautions / Restrictions Precautions Precautions: Fall Restrictions Weight Bearing Restrictions Per Provider Order: No    Has the patient had 2 or more falls or a fall with injury in the past year? Yes  Prior Activity Level Community (5-7x/wk): Pt. active in the community PTA  Prior Functional Level Self Care: Did the patient need help bathing, dressing, using the toilet or eating? Independent  Indoor Mobility: Did the patient need assistance with walking from room to room (with or without device)? Independent  Stairs: Did the patient need assistance  with internal or external stairs (with or without device)? Independent  Functional Cognition: Did the patient need help planning regular tasks such as shopping or remembering to take medications? Independent  Patient Information Are you of Hispanic, Latino/a,or Spanish origin?: A. No, not of Hispanic, Latino/a, or Spanish origin What is your race?: A. White Do you need or want an interpreter to communicate with a doctor or health care staff?: 0. No Patient information obtained via proxy : yes  Patient's Response To:  Health Literacy and Transportation Is the patient able to respond to health literacy and transportation needs?: No Health Literacy - How often do you need to have someone help you when you read instructions, pamphlets, or other written material from your doctor or pharmacy?: Patient unable to respond In the past 12 months, has lack of transportation kept you from medical appointments or from getting medications?: No In the past 12 months, has lack of transportation kept you from meetings, work, or from getting things needed for daily living?: No Higher Education Careers Adviser obtained via proxy: yes  Journalist, Newspaper / Equipment Home Equipment: Shower seat, Agricultural Consultant (2 wheels), Wheelchair - manual  Prior Device Use: Indicate devices/aids used by the patient prior to current illness, exacerbation or injury? Walker  Current Functional Level Cognition  Arousal/Alertness: Awake/alert Overall Cognitive Status:  Impaired/Different from baseline Orientation Level: Oriented X4 Attention: Sustained Sustained Attention: Impaired Sustained Attention Impairment: Verbal basic, Functional basic Memory: Impaired Memory Impairment: Decreased short term memory, Decreased recall of new information, Retrieval deficit Decreased Short Term Memory: Verbal basic, Functional basic Awareness: Appears intact Problem Solving: Appears intact Safety/Judgment: Appears intact    Extremity Assessment (includes Sensation/Coordination)  Upper Extremity Assessment: Defer to OT evaluation  Lower Extremity Assessment: Difficult to assess due to impaired cognition, Generalized weakness    ADLs  Overall ADL's : Needs assistance/impaired Eating/Feeding: NPO Grooming: Sitting, Moderate assistance Upper Body Bathing: Moderate assistance, Bed level Lower Body Bathing: Maximal assistance, Bed level Upper Body Dressing : Moderate assistance, Bed level Lower Body Dressing: Maximal assistance, Bed level Toilet Transfer: Moderate assistance, +2 for physical assistance Toileting- Clothing Manipulation and Hygiene: Maximal assistance Functional mobility during ADLs: Moderate assistance, +2 for physical assistance    Mobility  Overal bed mobility: Needs Assistance Bed Mobility: Sit to Supine, Supine to Sit Supine to sit: Mod assist Sit to supine: Mod assist General bed mobility comments: able to slowly bring LE's towards EOB with assist to complete. Cues to roll and assist to raise trunk. ModA for return to supine for LE management    Transfers  Overall transfer level: Needs assistance Equipment used: 2 person hand held assist Transfers: Sit to/from Stand Sit to Stand: Mod assist, +2 physical assistance General transfer comment: ModAx2 via [facility] with assist to boost-up and steady. Able to take a few steps towards [facility] with increased time and effort. Noted B LE fatigue with slight instability observed    Ambulation / Gait /  Stairs / Wheelchair Mobility  Ambulation/Gait General Gait Details: Deferred 2/2 level of alertness    Posture / Balance Dynamic Sitting Balance Sitting balance - Comments: initially ModA progressing to MinA. Posterior lean upon sitting upright with anterior lean after standing Balance Overall balance assessment: Needs assistance Sitting-balance support: Feet supported Sitting balance-Leahy Scale: Poor Sitting balance - Comments: initially ModA progressing to MinA. Posterior lean upon sitting upright with anterior lean after standing Postural control: Posterior lean, Other (comment) (anterior) Standing balance support: Bilateral upper extremity supported, During functional  activity, Reliant on assistive device for balance Standing balance-Leahy Scale: Zero Standing balance comment: reliant on ModAx2    Special considerations/life events  Special service needs none   Previous Home Environment (from acute therapy documentation) Living Arrangements: Alone  Lives With: Alone Available Help at Discharge: Family, Available PRN/intermittently (they check in on him daily) Type of Home: House Home Layout: One level Home Access: Stairs to enter Entrance Stairs-Rails: None Entrance Stairs-Number of Steps: 1 Bathroom Shower/Tub: Health Visitor: Handicapped height Bathroom Accessibility: Yes How Accessible: Accessible via walker, Accessible via wheelchair Home Care Services: No  Discharge Living Setting Plans for Discharge Living Setting: Patient's home Type of Home at Discharge: House Discharge Home Layout: One level Discharge Home Access: Stairs to enter Entrance Stairs-Number of Steps: 1 Discharge Bathroom Shower/Tub: Walk-in shower Discharge Bathroom Toilet: Standard Discharge Bathroom Accessibility: Yes How Accessible: Accessible via walker, Accessible via wheelchair Does the patient have any problems obtaining your medications?: No  Social/Family/Support  Systems Patient Roles: Other (Comment) Contact Information: [phone] Anticipated Caregiver: [name] Wingate Ability/Limitations of Caregiver: Daughter and other family can do 24/7 min a Caregiver Availability: 24/7 Discharge Plan Discussed with Primary Caregiver: Yes Is Caregiver In Agreement with Plan?: Yes Does Caregiver/Family have Issues with Lodging/Transportation while Pt is in Rehab?: No  Goals Patient/Family Goal for Rehab: PT/OT min A, SLP mod A Expected length of stay: 12-14 days Pt/Family Agrees to Admission and willing to participate: Yes Program Orientation Provided & Reviewed with Pt/Caregiver Including Roles  & Responsibilities: Yes  Decrease burden of Care through IP rehab admission: Not anticipated  Possible need for SNF placement upon discharge: Not anticipated  Patient Condition: I have reviewed medical records from [facility], spoken with CM, and patient and daughter. I met with patient at the bedside for inpatient rehabilitation assessment.  Patient will benefit from ongoing PT, OT, and SLP, can actively participate in 3 hours of therapy a day 5 days of the week, and can make measurable gains during the admission.  Patient will also benefit from the coordinated team approach during an Inpatient Acute Rehabilitation admission.  The patient will receive intensive therapy as well as Rehabilitation physician, nursing, social worker, and care management interventions.  Due to safety, skin/wound care, disease management, medication administration, pain management, and patient education the patient requires 24 hour a day rehabilitation nursing.  The patient is currently Min A-mod A with mobility and basic ADLs.  Discharge setting and therapy post discharge at home with home health is anticipated.  Patient has agreed to participate in the Acute Inpatient Rehabilitation Program and will admit {Time; today/tomorrow:10263}.  Preadmission Screen Completed By:  Leita KATHEE Kleine, 09/11/2024 1:33 PM ______________________________________________________________________   Discussed status with Dr. PIERRETTE on *** at *** and received approval for admission today.  Admission Coordinator:  Leita KATHEE Kleine, CCC-SLP, time PIERRETTEPattricia ***   Assessment/Plan: Diagnosis: *** Does the need for close, 24 hr/day Medical supervision in concert with the patient's rehab needs make it unreasonable for this patient to be served in a less intensive setting? {yes_no_potentially:3041433} Co-Morbidities requiring supervision/potential complications: *** Due to {due un:6958565}, does the patient require 24 hr/day rehab nursing? {yes_no_potentially:3041433} Does the patient require coordinated care of a physician, rehab nurse, PT, OT, and SLP to address physical and functional deficits in the context of the above medical diagnosis(es)? {yes_no_potentially:3041433} Addressing deficits in the following areas: {deficits:3041436} Can the patient actively participate in an intensive therapy program of at least 3 hrs of therapy 5  days a week? {yes_no_potentially:3041433} The potential for patient to make measurable gains while on inpatient rehab is {potential:3041437} Anticipated functional outcomes upon discharge from inpatient rehab: {functional outcomes:304600100} PT, {functional outcomes:304600100} OT, {functional outcomes:304600100} SLP Estimated rehab length of stay to reach the above functional goals is: *** Anticipated discharge destination: {anticipated dc setting:21604} 10. Overall Rehab/Functional Prognosis: {potential:3041437}   MD Signature: ***

## 2024-09-11 NOTE — Progress Notes (Signed)
   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 Palliative care has been asked to support goals of care conversations.   Today's Discussion 09/11/2024  *Please note that this is a verbal dictation therefore any spelling or grammatical errors are due to the Dragon Medical One system interpretation.  Chart reviewed inclusive of vital signs, progress notes of Dr. Cherlyn, Avelina Clause - SLP, Andrez George, RN - TOC, Dr. Melia - Nephrology, laboratory results Na 125, K 3.6, CHL 93, CO2 21, Glu 85, BUN 12, Cr 0.67, Ca 7.9, Anion Gap 11, Mg 1.8, GFR > 60, and diagnostic images -- CT w/o acute bleeding.   I met with Fredrico at bedside this morning. He is awakened easily. He is able to state his name and is aware he is in the hospital though does not recall why he is here. 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   Charle  denies pain, shortness of breath, or nausea this morning. He is able to share he would eat if provided breakfast.   Have spoken to nursing staff, RN Sonya who shares that Wylder has had great improvements from a cognitive perspective. She notes the plan to mobilize him today.  Plan to transition to CIR if approved by insurance.   Questions and concerns addressed/Palliative Support Provided.   Objective Assessment: Vital Signs Vitals:   09/11/24 0405 09/11/24 0815  BP: 124/62 (!) 107/55  Pulse: (!) 42 (!) 48  Resp: 15 15  Temp: 97.7 F (36.5 C) 98 F (36.7 C)  SpO2: 100% 100%    Intake/Output Summary (Last 24 hours) at 09/11/2024 0919 Last data filed at 09/11/2024 0408 Gross per 24 hour  Intake 480 ml  Output 100 ml  Net  380 ml   Last Weight  Most recent update: 09/08/2024  4:29 PM    Weight  45.6 kg (100 lb 8.5 oz)            Gen: Elderly Caucasian male chronically ill in appearance HEENT: Dry mucous membranes CV: Regular rate and rhythm PULM: On room air breathing is even and nonlabored ABD: soft/nontender EXT: No edema Neuro: Arouses, aware of person and place  SUMMARY OF RECOMMENDATIONS   Full code/full scope of care   Allowing time for outcomes --> Patients family would like to see if Safwan can make improvement(s)   Appreciate neurology support and management for encephalopathy   Patient's family plan to be present to support one-on-one feedings   Emotional support provided  CIR candidacy pending insurance authorization    Ongoing palliative care support as needed ______________________________________________________________________________________ Rosaline Becton [place] Palliative Medicine Team Team Cell Phone: [phone] Please utilize secure chat with additional questions, if there is no response within 30 minutes please call the above phone number  Billing based on MDM: Moderate   Palliative Medicine Team providers are available by phone from 7am to 7pm daily and can be reached through the team cell phone.  Should this patient require assistance outside of these hours, please call the patient's attending physician.

## 2024-09-11 NOTE — Progress Notes (Signed)
 Inpatient Rehab Admissions Coordinator:   I met with Pt. And his daughter to discuss potential CIR admit. They are interested and daughter and other family can provide 24/7 assist at d/c. I will send his case to insurance today.   Leita Kleine, MS, CCC-SLP Rehab Admissions Coordinator  [phone] (celll) [phone] (office)

## 2024-09-11 NOTE — Evaluation (Signed)
 Speech Language Pathology Evaluation Patient Details Name: Noah Burnett MRN: [id number] DOB: 03/28/1941 Today's Date: 09/11/2024 Time: 9064-8990 SLP Time Calculation (min) (ACUTE ONLY): 34 min  Problem List:  Patient Active Problem List   Diagnosis Date Noted   Failure to thrive in adult 09/08/2024   H/O bypass gastrojejunostomy 07/24/2024   Gastric outlet obstruction 06/27/2024   Hyponatremia 06/26/2024   Prediabetes 11/20/2021   Essential hypertension 05/08/2021   Past Medical History:  Past Medical History:  Diagnosis Date   Arthritis    Bladder tumor    Deafness, left    Full dentures    GERD (gastroesophageal reflux disease)    History of [place] spotted fever    01-25-2020  per pt had in 2018 and was treated w/ no residual   Hyperplasia of prostate with lower urinary tract symptoms (LUTS)    Hypertension    followed by pcp   (01-25-2020  per pt had stress test approx. 2005, told normal , done in [place] somewhere)   Pre-diabetes    followed by pcp ,   watches diet   Prostate cancer [facility]) urologist--- dr watt   dx by bx01-13-2020,  Gleason 3+4 ;  MRI 12-19-2018, vol 96.26;   active survillance   Shingles    Wears glasses    Wears hearing aid in right ear    Past Surgical History:  Past Surgical History:  Procedure Laterality Date   SHOULDER ARTHROSCOPY WITH ROTATOR CUFF REPAIR AND SUBACROMIAL DECOMPRESSION Right 05-20-2009  @[facility]    TRANSURETHRAL RESECTION OF BLADDER TUMOR WITH MITOMYCIN -C N/A 02/01/2020   Procedure: TRANSURETHRAL RESECTION OF BLADDER TUMOR;  Surgeon: Watt Rush, MD;  Location: [facility];  Service: Urology;  Laterality: N/A;   TRANSURETHRAL RESECTION OF PROSTATE     HPI:  Noah Burnett is a 83 y.o. 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  This was diagnosed at [facility].  He had an EGD that showed an hiatal hernia and  compression of the second portion of the duodenum with a possible intraluminal neoplasm.  Biopsies were obtained which showed severely inflamed and ulcerated duodenal mucosa, but no evidence of malignancy.  A second EGD was done with ultrasound guidance, but again pathology of the lesion extensive necrosis.  Surgical oncology was consulted and he underwent a robotic gastrojejunostomy on 8/25.  He did have a delayed return of bowel function and had a PICC line with TPN for several days until he had advancement of his diet.  Since his procedure, he has had a fairly steady decline with decreasing appetite, significant weight loss, inability to walk.  He had follow-up with surgical oncology.  He had a repeat MRI which did not show the duodenal mass.  Today, the patient began to exhibit confusion and had an unwitnessed fall.  He was brought in to the hospital for evaluation.  Here, the patient continues to be altered with no clear speech.  He mumbles and is unable to give any meaningful history.  The history is obtained by the patient's son who relayed all the above information.  Daughter noted recent shingles dx resulting in encephalopathy per daughter's report.   Assessment / Plan / Recommendation Clinical Impression  Pt exhibited decreased processing/execution of information with majority of tasks administered including auditory comprehension, verbal expression and various sustained attention tasks.  Pt's intelligibility decreased d/t dysarthria/hypophonic vocal quality c/b decreased breath support for speech with intelligibility  for words-phrases being 50-75% accurate.  Pt exhibited decreased naming during responsive naming and deductive naming tasks with perseveration noted.    Pt was Ox4, followed 3 step directives, answered personal information questions and completed simple calculation tasks with longer execution time required.  Pt's daughter stated he was sharp prior to this hospitalization, so cognitive  deficits impacting performance during this evaluation.  Mental fatigue observed, so further cognitive assessment halted until next session.  Recommend ST f/u for cognitive/linguistic changes during acute stay.  May benefit from CIR referral.    SLP Assessment  SLP Recommendation/Assessment: Patient needs continued Speech Language Pathology Services SLP Visit Diagnosis: Dysarthria and anarthria (R47.1);Cognitive communication deficit (R41.841)     Assistance Recommended at Discharge  PRN  Functional Status Assessment Patient has had a recent decline in their functional status and demonstrates the ability to make significant improvements in function in a reasonable and predictable amount of time.  Frequency and Duration min 2x/week  1 week      SLP Evaluation Cognition  Overall Cognitive Status: Impaired/Different from baseline Arousal/Alertness: Awake/alert Orientation Level: Oriented X4 Attention: Sustained Sustained Attention: Impaired Sustained Attention Impairment: Verbal basic;Functional basic Memory: Impaired Memory Impairment: Decreased short term memory;Decreased recall of new information;Retrieval deficit Decreased Short Term Memory: Verbal basic;Functional basic Awareness: Appears intact Problem Solving: Appears intact Safety/Judgment: Appears intact       Comprehension  Auditory Comprehension Overall Auditory Comprehension: Impaired Yes/No Questions: Within Functional Limits Commands: Within Functional Limits Conversation: Simple Other Conversation Comments: Language processing delayed Interfering Components: Attention;Processing speed;Working Radio Broadcast Assistant: Dietitian: Not tested Reading Comprehension Reading Status: Not tested    Expression Expression Primary Mode of Expression: Verbal Verbal Expression Overall Verbal Expression: Impaired Level of Generative/Spontaneous  Verbalization: Sentence Repetition: No impairment Naming: Impairment Responsive: 76-100% accurate Confrontation: Within functional limits Convergent: 50-74% accurate Divergent: 50-74% accurate Pragmatics: Unable to assess Non-Verbal Means of Communication: Not applicable Written Expression Dominant Hand: Right Written Expression: Not tested   Oral / Motor  Oral Motor/Sensory Function Overall Oral Motor/Sensory Function: Mild impairment Facial ROM: Reduced right Facial Symmetry: Abnormal symmetry right Lingual Symmetry: Within Functional Limits Motor Speech Overall Motor Speech: Appears within functional limits for tasks assessed Respiration: Impaired Level of Impairment: Sentence Phonation: Low vocal intensity Resonance: Within functional limits Articulation: Impaired Level of Impairment: Word Intelligibility: Intelligibility reduced Word: 50-74% accurate Phrase: 50-74% accurate Sentence: 50-74% accurate Conversation: 50-74% accurate Motor Planning: Within functional limits Motor Speech Errors: Not applicable Interfering Components: Inadequate dentition Effective Techniques: Slow rate;Increased vocal intensity            Pat Nichele Slawson,M.S.,CCC-SLP 09/11/2024, 10:30 AM

## 2024-09-11 NOTE — Consult Note (Signed)
 [facility]  Total days of antibiotics2       Reason for Consult:altered mental status, improved on acyclovir    Referring Physician: arora  Principal Problem:   Failure to thrive in adult Active Problems:   Essential hypertension   Gastric outlet obstruction   H/O bypass gastrojejunostomy   Hyponatremia   Prediabetes    HPI: Noah Burnett is a 83 y.o. male with hx of HTN, prostate ca, GERD, duodenal mass s/p gastrojejunostomy with some associated failure to thrive who was diagnosed with left facial V1 distribution of shingles with eye involvement in late September, he was given 10 day course of antivirals and was improving initially. In the last 2 weeks he started to have worsening confusion and unwitness fall at home which brought him to the hospital. He underwent LP, CSF showing wbc 2 and TP 65 and glucose of 44. His ME panel is negative. He was initially started on acyclovir for which his mental status is starting to improve. ID asked to weigh in to whether this could still be VZV encephalitis despite negative PCR test.  Past Medical History:  Diagnosis Date   Arthritis    Bladder tumor    Deafness, left    Full dentures    GERD (gastroesophageal reflux disease)    History of [place] spotted fever    01-25-2020  per pt had in 2018 and was treated w/ no residual   Hyperplasia of prostate with lower urinary tract symptoms (LUTS)    Hypertension    followed by pcp   (01-25-2020  per pt had stress test approx. 2005, told normal , done in [place])   Pre-diabetes    followed by pcp ,   watches diet   Prostate cancer [facility]) urologist--- dr watt   dx by bx01-13-2020,  Gleason 3+4 ;  MRI 12-19-2018, vol 96.26;   active survillance   Shingles    Wears glasses    Wears hearing aid in right ear     Allergies:  Allergies  Allergen Reactions   Haemophilus Influenzae Vaccines Other (See Comments)    I get deathly sick     Current antibiotics:   MEDICATIONS:  aspirin EC  81 mg Oral Daily   enoxaparin (LOVENOX) injection  30 mg Subcutaneous Q24H   feeding supplement  1 Container Oral TID BM   multivitamin with minerals  1 tablet Oral Daily   pantoprazole   40 mg Oral Daily   polyethylene glycol  17 g Oral Daily   rosuvastatin  10 mg Oral Daily   senna  2 tablet Oral Daily    Social History   Tobacco Use   Smoking status: Former    Current packs/day: 0.00    Average packs/day: 1 pack/day for 36.0 years (36.0 ttl pk-yrs)    Types: Cigarettes    Start date: 1964    Quit date: 2000    Years since quitting: 25.8    Passive exposure: Past   Smokeless tobacco: Never  Vaping Use   Vaping status: Never Used  Substance Use Topics   Alcohol use: Not Currently   Drug use: Never    History reviewed. No pertinent family history.  Review of Systems -  Decreased appetite. No facial pain. 12 point ros is negative otherwise  OBJECTIVE: Temp:  [97.4 F (36.3 C)-99.4 F (37.4 C)] 98 F (36.7 C) (11/03 0815) Pulse Rate:  [42-64] 48 (11/03 0815) Resp:  [15-20] 15 (11/03  0815) BP: (106-124)/(55-72) 107/55 (11/03 0815) SpO2:  [100 %] 100 % (11/03 0815) Physical Exam  Constitutional: He is oriented to person, place, only. He appears stated age and under-nourished. No distress.  HENT:  Mouth/Throat: Oropharynx is clear and moist. No oropharyngeal exudate.  Cardiovascular: Normal rate, regular rhythm and normal heart sounds. Exam reveals no gallop and no friction rub.  No murmur heard.  Pulmonary/Chest: Effort normal and breath sounds normal. No respiratory distress. He has no wheezes.  Abdominal: Soft. Bowel sounds are normal. He exhibits no distension. There is no tenderness.  Lymphadenopathy:  He has no cervical adenopathy.  Neurological: He is alert and oriented to person, place, only.  Skin: Skin is warm and dry. No rash noted. No erythema. No rash to his face, some slight hyperpigmentation  scarring Psychiatric: He has a normal mood and affect. His behavior is normal.   LABS: Results for orders placed or performed during the hospital encounter of 09/08/24 (from the past 48 hours)  Glucose, capillary     Status: Abnormal   Collection Time: 09/09/24 12:01 PM  Result Value Ref Range   Glucose-Capillary 68 (L) 70 - 99 mg/dL    Comment: Glucose reference range applies only to samples taken after fasting for at least 8 hours.  Glucose, capillary     Status: Abnormal   Collection Time: 09/09/24 12:42 PM  Result Value Ref Range   Glucose-Capillary 141 (H) 70 - 99 mg/dL    Comment: Glucose reference range applies only to samples taken after fasting for at least 8 hours.  Vitamin B12     Status: None   Collection Time: 09/09/24  6:20 PM  Result Value Ref Range   Vitamin B-12 267 180 - 914 pg/mL    Comment: (NOTE) This assay is not validated for testing neonatal or myeloproliferative syndrome specimens for Vitamin B12 levels. Performed at [facility] Lab, [address] [place], [place] [address]   TSH     Status: Abnormal   Collection Time: 09/09/24  6:20 PM  Result Value Ref Range   TSH 4.632 (H) 0.350 - 4.500 uIU/mL    Comment: Performed by a 3rd Generation assay with a functional sensitivity of <=0.01 uIU/mL. Performed at [facility], [address] [place], [place] [address]   Cortisol-am, blood     Status: None   Collection Time: 09/09/24  6:20 PM  Result Value Ref Range   Cortisol - AM 12.5 6.7 - 22.6 ug/dL    Comment: Performed at [facility], [address], [place], [place] [address]  T4, free     Status: None   Collection Time: 09/09/24  6:20 PM  Result Value Ref Range   Free T4 0.92 0.61 - 1.12 ng/dL    Comment: (NOTE) Biotin ingestion may interfere with free T4 tests. If the results are inconsistent with the TSH level, previous test results, or the clinical presentation, then consider biotin interference. If needed, order repeat testing  after stopping biotin. Performed at [facility], [address], [place], [place] [address]   Phosphorus     Status: None   Collection Time: 09/10/24  2:55 AM  Result Value Ref Range   Phosphorus 3.6 2.5 - 4.6 mg/dL    Comment: Performed at [facility], [address] [place], [place] [address]  CK     Status: Abnormal   Collection Time: 09/10/24  2:55 AM  Result Value Ref Range  Total CK 30 (L) 49 - 397 U/L    Comment: Performed at [facility], [address] [place], [place] [address]  CBC with Differential/Platelet     Status: Abnormal   Collection Time: 09/10/24  2:55 AM  Result Value Ref Range   WBC 8.5 4.0 - 10.5 K/uL   RBC 4.82 4.22 - 5.81 MIL/uL   Hemoglobin 14.6 13.0 - 17.0 g/dL   HCT 57.8 60.9 - 47.9 %   MCV 87.3 80.0 - 100.0 fL   MCH 30.3 26.0 - 34.0 pg   MCHC 34.7 30.0 - 36.0 g/dL   RDW 83.3 (H) 88.4 - 84.4 %   Platelets 214 150 - 400 K/uL   nRBC 0.0 0.0 - 0.2 %   Neutrophils Relative % 73 %   Neutro Abs 6.2 1.7 - 7.7 K/uL   Lymphocytes Relative 14 %   Lymphs Abs 1.2 0.7 - 4.0 K/uL   Monocytes Relative 9 %   Monocytes Absolute 0.7 0.1 - 1.0 K/uL   Eosinophils Relative 3 %   Eosinophils Absolute 0.3 0.0 - 0.5 K/uL   Basophils Relative 0 %   Basophils Absolute 0.0 0.0 - 0.1 K/uL   Immature Granulocytes 1 %   Abs Immature Granulocytes 0.04 0.00 - 0.07 K/uL    Comment: Performed at [facility], [address], [place], [place] [address]  Comprehensive metabolic panel with GFR     Status: Abnormal   Collection Time: 09/10/24  2:55 AM  Result Value Ref Range   Sodium 127 (L) 135 - 145 mmol/L   Potassium 4.1 3.5 - 5.1 mmol/L   Chloride 91 (L) 98 - 111 mmol/L   CO2 24 22 - 32 mmol/L   Glucose, Bld 95 70 - 99 mg/dL    Comment: Glucose reference range applies only to samples taken after fasting for at least 8 hours.   BUN 9 8 - 23 mg/dL   Creatinine, Ser 9.26 0.61 - 1.24 mg/dL   Calcium 8.3 (L) 8.9 - 10.3 mg/dL   Total Protein 5.6  (L) 6.5 - 8.1 g/dL   Albumin 3.0 (L) 3.5 - 5.0 g/dL   AST 14 (L) 15 - 41 U/L   ALT 11 0 - 44 U/L   Alkaline Phosphatase 65 38 - 126 U/L   Total Bilirubin 1.1 0.0 - 1.2 mg/dL   GFR, Estimated >39 >39 mL/min    Comment: (NOTE) Calculated using the CKD-EPI Creatinine Equation (2021)    Anion gap 12 5 - 15    Comment: Performed at [facility], [address], [place], [place] [address]  Magnesium     Status: None   Collection Time: 09/10/24  2:55 AM  Result Value Ref Range   Magnesium 1.7 1.7 - 2.4 mg/dL    Comment: Performed at [facility], [address], [place], [place] [address]  Procalcitonin     Status: None   Collection Time: 09/10/24  2:55 AM  Result Value Ref Range   Procalcitonin <0.10 ng/mL    Comment:        Interpretation: PCT (Procalcitonin) <= 0.5 ng/mL: Systemic infection (sepsis) is not likely. Local bacterial infection is possible. (NOTE)       Sepsis PCT Algorithm           Lower Respiratory Tract  Infection PCT Algorithm    ----------------------------     ----------------------------         PCT < 0.25 ng/mL                PCT < 0.10 ng/mL          Strongly encourage             Strongly discourage   discontinuation of antibiotics    initiation of antibiotics    ----------------------------     -----------------------------       PCT 0.25 - 0.50 ng/mL            PCT 0.10 - 0.25 ng/mL               OR       >80% decrease in PCT            Discourage initiation of                                            antibiotics      Encourage discontinuation           of antibiotics    ----------------------------     -----------------------------         PCT >= 0.50 ng/mL              PCT 0.26 - 0.50 ng/mL               AND        <80% decrease in PCT             Encourage initiation of                                             antibiotics       Encourage continuation           of antibiotics     ----------------------------     -----------------------------        PCT >= 0.50 ng/mL                  PCT > 0.50 ng/mL               AND         increase in PCT                  Strongly encourage                                      initiation of antibiotics    Strongly encourage escalation           of antibiotics                                     -----------------------------                                           PCT <= 0.25 ng/mL  OR                                        > 80% decrease in PCT                                      Discontinue / Do not initiate                                             antibiotics  Performed at [facility], [address] [place], [place] [address]   C-reactive protein     Status: Abnormal   Collection Time: 09/10/24  2:55 AM  Result Value Ref Range   CRP 4.1 (H) <1.0 mg/dL    Comment: Performed at [facility], [address] [place], [place] [address]  Protime-INR     Status: None   Collection Time: 09/10/24  2:55 AM  Result Value Ref Range   Prothrombin Time 14.2 11.4 - 15.2 seconds   INR 1.0 0.8 - 1.2    Comment: (NOTE) INR goal varies based on device and disease states. Performed at [facility], [address], [place], [place] [address]   Lipid panel     Status: None   Collection Time: 09/10/24  2:55 AM  Result Value Ref Range   Cholesterol 130 0 - 200 mg/dL   Triglycerides 55 <849 mg/dL   HDL 44 >59 mg/dL   Total CHOL/HDL Ratio 3.0 RATIO   VLDL 11 0 - 40 mg/dL   LDL Cholesterol 75 0 - 99 mg/dL    Comment:        Total Cholesterol/HDL:CHD Risk Coronary Heart Disease Risk Table                     Men   Women  1/2 Average Risk   3.4   3.3  Average Risk       5.0   4.4  2 X Average Risk   9.6   7.1  3 X Average Risk  23.4   11.0        Use the calculated Patient Ratio above and the CHD Risk Table to determine the patient's CHD Risk.        ATP  III CLASSIFICATION (LDL):  <100     mg/dL   Optimal  899-870  mg/dL   Near or Above                    Optimal  130-159  mg/dL   Borderline  839-810  mg/dL   High  >809     mg/dL   Very High Performed at [facility], [address], [place], [place] [address]   Hemoglobin A1c     Status: None   Collection Time: 09/10/24  2:55 AM  Result Value Ref Range   Hgb A1c MFr Bld 5.6 4.8 - 5.6 %    Comment: (NOTE) Diagnosis of Diabetes The following HbA1c ranges recommended by the American Diabetes Association (ADA) may be used as an aid in the diagnosis of diabetes mellitus.  Hemoglobin  Suggested A1C NGSP%              Diagnosis  <5.7                   Non Diabetic  5.7-6.4                Pre-Diabetic  >6.4                   Diabetic  <7.0                   Glycemic control for                       adults with diabetes.     Mean Plasma Glucose 114.02 mg/dL    Comment: Performed at [facility], [address], [place], [place] [address]  TSH     Status: Abnormal   Collection Time: 09/10/24  2:55 AM  Result Value Ref Range   TSH 6.172 (H) 0.350 - 4.500 uIU/mL    Comment: Performed by a 3rd Generation assay with a functional sensitivity of <=0.01 uIU/mL. Performed at [facility], [address] [place], [place] [address]   CSF culture w Gram Stain     Status: None (Preliminary result)   Collection Time: 09/10/24  7:10 AM   Specimen: CSF; Cerebrospinal Fluid  Result Value Ref Range   Specimen Description CSF    Special Requests NONE    Gram Stain      WBC PRESENT, PREDOMINANTLY MONONUCLEAR NO ORGANISMS SEEN CYTOSPIN SMEAR    Culture      NO GROWTH < 24 HOURS Performed at [facility], [address], [place], [place] [address]    Report Status PENDING   Meningitis/Encephalitis Panel (CSF)     Status: None   Collection Time: 09/10/24  7:10 AM  Result Value Ref Range   Cryptococcus neoformans/gattii (CSF) NOT DETECTED NOT DETECTED     Comment: (NOTE) Patients with a suspicion of cryptococcal meningitis should be tested  for cryptococcal antigen (CrAg).      Cytomegalovirus (CSF) NOT DETECTED NOT DETECTED   Enterovirus (CSF) NOT DETECTED NOT DETECTED   Escherichia coli K1 (CSF) NOT DETECTED NOT DETECTED    Comment: (NOTE) Only E. coli strains possessing the K1 capsular antigen will be detected.      Haemophilus influenzae (CSF) NOT DETECTED NOT DETECTED   Herpes simplex virus 1 (CSF) NOT DETECTED NOT DETECTED   Herpes simplex virus 2 (CSF) NOT DETECTED NOT DETECTED   Human herpesvirus 6 (CSF) NOT DETECTED NOT DETECTED   Human parechovirus (CSF) NOT DETECTED NOT DETECTED   Listeria monocytogenes (CSF) NOT DETECTED NOT DETECTED   Neisseria meningitis (CSF) NOT DETECTED NOT DETECTED    Comment: (NOTE) Only encapsulated strains of N. meningitidis will be detected.     Streptococcus agalactiae (CSF) NOT DETECTED NOT DETECTED   Streptococcus pneumoniae (CSF) NOT DETECTED NOT DETECTED   Varicella zoster virus (CSF) NOT DETECTED NOT DETECTED    Comment: Performed at [facility], [address] [place], [place] [address]  Protein and glucose, CSF     Status: Abnormal   Collection Time: 09/10/24 12:45 PM  Result Value Ref Range   Glucose, CSF 44 40 - 70 mg/dL   Total  Protein, CSF 65 (H) 15 - 45 mg/dL    Comment: Performed at [facility], [address], [place],  [place] [address]  CSF cell count with differential collection tube #: 1     Status: Abnormal   Collection Time: 09/10/24 12:45 PM  Result Value Ref Range   Tube # 1    Color, CSF COLORLESS COLORLESS   Appearance, CSF CLEAR CLEAR   Supernatant NOT INDICATED    RBC Count, CSF 12 (H) 0 /cu mm   WBC, CSF 2 0 - 5 /cu mm   Other Cells, CSF TOO FEW TO COUNT, SMEAR AVAILABLE FOR REVIEW     Comment: RARE LYMPH, RARE MONO, RARE EOS Performed at [facility], [address] [place], [place] [address]   CSF cell count with differential  collection tube #: 4     Status: None   Collection Time: 09/10/24 12:46 PM  Result Value Ref Range   Tube # 4    Color, CSF COLORLESS COLORLESS   Appearance, CSF CLEAR CLEAR   Supernatant NOT INDICATED    RBC Count, CSF 0 0 /cu mm   WBC, CSF 1 0 - 5 /cu mm   Other Cells, CSF TOO FEW TO COUNT, SMEAR AVAILABLE FOR REVIEW     Comment: RARE LYMPHS, RARE EOS, RARE MONO Performed at [facility], [address] [place], [place] [address]   Sodium, urine, random     Status: None   Collection Time: 09/10/24  3:07 PM  Result Value Ref Range   Sodium, Ur 137 mmol/L    Comment: Performed at [facility], [address], [place], [place] [address]  Osmolality, urine     Status: None   Collection Time: 09/10/24  3:07 PM  Result Value Ref Range   Osmolality, Ur 490 300 - 900 mOsm/kg    Comment: Performed at [facility], [address] [place], [place] [address]  CBC with Differential/Platelet     Status: Abnormal   Collection Time: 09/11/24  2:58 AM  Result Value Ref Range   WBC 6.1 4.0 - 10.5 K/uL   RBC 4.39 4.22 - 5.81 MIL/uL   Hemoglobin 13.4 13.0 - 17.0 g/dL   HCT 62.4 (L) 60.9 - 47.9 %   MCV 85.4 80.0 - 100.0 fL   MCH 30.5 26.0 - 34.0 pg   MCHC 35.7 30.0 - 36.0 g/dL   RDW 83.8 (H) 88.4 - 84.4 %   Platelets 183 150 - 400 K/uL   nRBC 0.0 0.0 - 0.2 %   Neutrophils Relative % 67 %   Neutro Abs 4.2 1.7 - 7.7 K/uL   Lymphocytes Relative 18 %   Lymphs Abs 1.1 0.7 - 4.0 K/uL   Monocytes Relative 11 %   Monocytes Absolute 0.7 0.1 - 1.0 K/uL   Eosinophils Relative 2 %   Eosinophils Absolute 0.1 0.0 - 0.5 K/uL   Basophils Relative 1 %   Basophils Absolute 0.0 0.0 - 0.1 K/uL   Immature Granulocytes 1 %   Abs Immature Granulocytes 0.03 0.00 - 0.07 K/uL    Comment: Performed at [facility], [address] [place], [place] [address]  Comprehensive metabolic panel with GFR     Status: Abnormal   Collection Time: 09/11/24  2:58 AM  Result Value Ref Range    Sodium 125 (L) 135 - 145 mmol/L   Potassium 3.6 3.5 - 5.1 mmol/L   Chloride 93 (L) 98 - 111 mmol/L   CO2 21 (L) 22 - 32 mmol/L   Glucose, Bld 85 70 - 99 mg/dL  Comment: Glucose reference range applies only to samples taken after fasting for at least 8 hours.   BUN 12 8 - 23 mg/dL   Creatinine, Ser 9.32 0.61 - 1.24 mg/dL   Calcium 7.9 (L) 8.9 - 10.3 mg/dL   Total Protein 4.7 (L) 6.5 - 8.1 g/dL   Albumin 2.6 (L) 3.5 - 5.0 g/dL   AST 12 (L) 15 - 41 U/L   ALT 9 0 - 44 U/L   Alkaline Phosphatase 58 38 - 126 U/L   Total Bilirubin 1.6 (H) 0.0 - 1.2 mg/dL   GFR, Estimated >39 >39 mL/min    Comment: (NOTE) Calculated using the CKD-EPI Creatinine Equation (2021)    Anion gap 11 5 - 15    Comment: Performed at [facility] Lab, [address], [place], [place] [address]  Magnesium     Status: None   Collection Time: 09/11/24  2:58 AM  Result Value Ref Range   Magnesium 1.8 1.7 - 2.4 mg/dL    Comment: Performed at [facility], [address] [place], [place] [address]  Procalcitonin     Status: None   Collection Time: 09/11/24  2:58 AM  Result Value Ref Range   Procalcitonin <0.10 ng/mL    Comment:        Interpretation: PCT (Procalcitonin) <= 0.5 ng/mL: Systemic infection (sepsis) is not likely. Local bacterial infection is possible. (NOTE)       Sepsis PCT Algorithm           Lower Respiratory Tract                                      Infection PCT Algorithm    ----------------------------     ----------------------------         PCT < 0.25 ng/mL                PCT < 0.10 ng/mL          Strongly encourage             Strongly discourage   discontinuation of antibiotics    initiation of antibiotics    ----------------------------     -----------------------------       PCT 0.25 - 0.50 ng/mL            PCT 0.10 - 0.25 ng/mL               OR       >80% decrease in PCT            Discourage initiation of                                            antibiotics       Encourage discontinuation           of antibiotics    ----------------------------     -----------------------------         PCT >= 0.50 ng/mL              PCT 0.26 - 0.50 ng/mL               AND        <80% decrease in PCT             Encourage initiation of  antibiotics       Encourage continuation           of antibiotics    ----------------------------     -----------------------------        PCT >= 0.50 ng/mL                  PCT > 0.50 ng/mL               AND         increase in PCT                  Strongly encourage                                      initiation of antibiotics    Strongly encourage escalation           of antibiotics                                     -----------------------------                                           PCT <= 0.25 ng/mL                                                 OR                                        > 80% decrease in PCT                                      Discontinue / Do not initiate                                             antibiotics  Performed at [facility], [address], [place], [place] [address]   C-reactive protein     Status: Abnormal   Collection Time: 09/11/24  2:58 AM  Result Value Ref Range   CRP 7.2 (H) <1.0 mg/dL    Comment: Performed at [facility], [address] [place], [place] [address]  Osmolality     Status: Abnormal   Collection Time: 09/11/24  2:58 AM  Result Value Ref Range   Osmolality 257 (L) 275 - 295 mOsm/kg    Comment: Performed at [facility], [address], [place], [place] [address]    MICRO:  IMAGING: ECHOCARDIOGRAM COMPLETE Result Date: 09/10/2024    ECHOCARDIOGRAM REPORT   Patient Name:   DUANNE DUCHESNE Date of Exam: 09/10/2024 Medical Rec #:  [id number]        Height:       67.0 in Accession #:    [id number]       Weight:  100.5 lb Date of Birth:  01/15/1941        BSA:          1.510 m Patient Age:    80  years         BP:           112/57 mmHg Patient Gender: M                HR:           59 bpm. Exam Location:  Inpatient Procedure: 2D Echo, 3D Echo, Cardiac Doppler, Color Doppler and Strain Analysis            (Both Spectral and Color Flow Doppler were utilized during            procedure). Indications:    Stroke  History:        Patient has no prior history of Echocardiogram examinations.                 Risk Factors:Hypertension.  Sonographer:    Philomena Daring Referring Phys: [id number] ASHISH ARORA  Sonographer Comments: Global longitudinal strain was attempted. IMPRESSIONS  1. Left ventricular ejection fraction, by estimation, is 55%. Left ventricular ejection fraction by 3D volume is 52 %. The left ventricle has normal function. The left ventricle has no regional wall motion abnormalities. Left ventricular diastolic parameters are consistent with Grade I diastolic dysfunction (impaired relaxation). The average left ventricular global longitudinal strain is -13.3 %.  2. Right ventricular systolic function is normal. The right ventricular size is normal. Tricuspid regurgitation signal is inadequate for assessing PA pressure.  3. The mitral valve is normal in structure. Trivial mitral valve regurgitation. No evidence of mitral stenosis.  4. The aortic valve is tricuspid. Aortic valve regurgitation is mild. No aortic stenosis is present.  5. The inferior vena cava is normal in size with greater than 50% respiratory variability, suggesting right atrial pressure of 3 mmHg. Comparison(s): No prior Echocardiogram. FINDINGS  Left Ventricle: Left ventricular ejection fraction, by estimation, is 55%. Left ventricular ejection fraction by 3D volume is 52 %. The left ventricle has normal function. The left ventricle has no regional wall motion abnormalities. The average left ventricular global longitudinal strain is -13.3 %. Strain was performed and the global longitudinal strain is indeterminate. The left ventricular  internal cavity size was normal in size. There is no left ventricular hypertrophy. Left ventricular diastolic parameters are consistent with Grade I diastolic dysfunction (impaired relaxation). Normal left ventricular filling pressure. Right Ventricle: The right ventricular size is normal. No increase in right ventricular wall thickness. Right ventricular systolic function is normal. Tricuspid regurgitation signal is inadequate for assessing PA pressure. Left Atrium: Left atrial size was normal in size. Right Atrium: Right atrial size was normal in size. Pericardium: There is no evidence of pericardial effusion. Mitral Valve: The mitral valve is normal in structure. Trivial mitral valve regurgitation. No evidence of mitral valve stenosis. Tricuspid Valve: The tricuspid valve is normal in structure. Tricuspid valve regurgitation is not demonstrated. No evidence of tricuspid stenosis. Aortic Valve: The aortic valve is tricuspid. Aortic valve regurgitation is mild. No aortic stenosis is present. Pulmonic Valve: The pulmonic valve was not well visualized. Pulmonic valve regurgitation is not visualized. No evidence of pulmonic stenosis. Aorta: The aortic root and ascending aorta are structurally normal, with no evidence of dilitation. Venous: The inferior vena cava is normal in size with greater than 50% respiratory variability, suggesting right atrial pressure of 3 mmHg. IAS/Shunts:  No atrial level shunt detected by color flow Doppler. Additional Comments: 3D was performed not requiring image post processing on an independent workstation and was abnormal.  LEFT VENTRICLE PLAX 2D LVIDd:         4.00 cm         Diastology LVIDs:         2.80 cm         LV e' medial:  4.57 cm/s LV PW:         1.10 cm         LV e' lateral: 7.94 cm/s LV IVS:        1.10 cm LVOT diam:     2.00 cm         2D Longitudinal LV SV:         59              Strain LV SV Index:   39              2D Strain GLS   -13.3 % LVOT Area:     3.14 cm         Avg:                                 3D Volume EF                                LV 3D EF:    Left                                             ventricul                                             ar                                             ejection                                             fraction                                             by 3D                                             volume is                                             52 %.  3D Volume EF:                                3D EF:        52 %                                LV EDV:       84 ml                                LV ESV:       40 ml                                LV SV:        43 ml RIGHT VENTRICLE             IVC RV Basal diam:  2.80 cm     IVC diam: 1.20 cm RV Mid diam:    2.50 cm RV S prime:     12.50 cm/s TAPSE (M-mode): 1.6 cm LEFT ATRIUM             Index        RIGHT ATRIUM          Index LA diam:        3.40 cm 2.25 cm/m   RA Area:     9.48 cm LA Vol (A2C):   35.1 ml 23.25 ml/m  RA Volume:   14.90 ml 9.87 ml/m LA Vol (A4C):   37.9 ml 25.11 ml/m LA Biplane Vol: 38.1 ml 25.24 ml/m  AORTIC VALVE LVOT Vmax:   95.40 cm/s LVOT Vmean:  61.300 cm/s LVOT VTI:    0.188 m  AORTA Ao Root diam: 3.20 cm Ao Asc diam:  3.00 cm MITRAL VALVE MV Area (PHT): 2.48 cm    SHUNTS MV Decel Time: 306 msec    Systemic VTI:  0.19 m MV A velocity: 84.40 cm/s  Systemic Diam: 2.00 cm Vishnu Priya Mallipeddi Electronically signed by Diannah Late Mallipeddi Signature Date/Time: 09/10/2024/4:39:28 PM    Final    DG FL GUIDED LUMBAR PUNCTURE Result Date: 09/10/2024 CLINICAL DATA:  83 year old male with a history of progressively worsening confusion, lethargy, and decreasing oral intake. Noticed to have worsening generalized declined following shingles outbreak. Request for lumbar puncture to rule out HSV encephalitis. EXAM: LUMBAR PUNCTURE UNDER FLUOROSCOPY PROCEDURE: An appropriate skin entry site was determined  fluoroscopically. Operator donned sterile gloves and mask. Skin site was marked, then prepped with Betadine, draped in usual sterile fashion, and infiltrated locally with 1% lidocaine . A 20 gauge spinal needle advanced into the thecal sac at L5-S1 from a right interlaminar approach. Clear colorless CSF spontaneously returned, with opening pressure of 13 cm water. 15 ml CSF were collected and divided among 4 sterile vials for the requested laboratory studies. The needle was then removed. The patient tolerated the procedure well and there were no complications. FLUOROSCOPY: Radiation Exposure Index (as provided by the fluoroscopic device): 2.7 mGy Kerma IMPRESSION: Technically successful lumbar puncture under fluoroscopy. This exam was performed by [facility] PA-C, and was supervised and interpreted by Dr. Ree Molt. Electronically Signed   By: Ree Molt M.D.   On: 09/10/2024 14:00   EEG adult Result Date: 09/10/2024  [place] Arlin KIDD, MD     09/10/2024  7:23 AM Patient Name: ADELFO DIEBEL MRN: [id number] Epilepsy Attending: Arlin KIDD [place] Referring Physician/Provider: Voncile Isles, MD Date: 09/10/2024 Duration: 25.59 mins Patient history: 83yo M with ams. EEG to evaluate for seizure Level of alertness: Awake, asleep AEDs during EEG study: None Technical aspects: This EEG study was done with scalp electrodes positioned according to the 10-20 International system of electrode placement. Electrical activity was reviewed with band pass filter of 1-70Hz , sensitivity of 7 uV/mm, display speed of 87mm/sec with a 60Hz  notched filter applied as appropriate. EEG data were recorded continuously and digitally stored.  Video monitoring was available and reviewed as appropriate. Description: The posterior dominant rhythm consists of 8-9Hz  activity of moderate voltage (25-35 uV) seen predominantly in posterior head regions, symmetric and reactive to eye opening and eye closing. Sleep was characterized by vertex  waves, sleep spindles (12 to 14 Hz), maximal frontocentral region. There is intermittent generalized 3 to 6 Hz theta-delta slowing. Hyperventilation and photic stimulation were not performed.   ABNORMALITY - Intermittent slow, generalized IMPRESSION: This study is suggestive of diffuse cerebral dysfunction (encephalopathy). No seizures or epileptiform discharges were seen throughout the recording. Priyanka KIDD [place]   CT ANGIO HEAD NECK W WO CM Result Date: 09/10/2024 EXAM: CTA Head and Neck with Intravenous Contrast. CT Head without Contrast. CLINICAL HISTORY: Neuro deficit, acute, stroke suspected. TECHNIQUE: Axial CTA images of the head and neck performed with intravenous contrast. MIP reconstructed images were created and reviewed. Axial computed tomography images of the head/brain performed without intravenous contrast. Note: Per PQRS, the description of internal carotid artery percent stenosis, including 0 percent or normal exam, is based on [employer] American Symptomatic Carotid Endarterectomy Trial (NASCET) criteria. Dose reduction technique was used including one or more of the following: automated exposure control, adjustment of mA and kV according to patient size, and/or iterative reconstruction. CONTRAST: Without and with; 75 mL (iohexol  (OMNIPAQUE ) 350 MG/ML injection 75 mL IOHEXOL  350 MG/ML SOLN). COMPARISON: MRI from 09/09/2024 and CT from 09/08/2024. FINDINGS: CT HEAD: BRAIN: Changes of atrophy with chronic microvascular ischemic disease noted. Previously identified parenchymal changes are not well visualized by CT. No acute intracranial hemorrhage. No further acute large vessel territory infarct. No mass lesion or midline shift. No hydrocephalus or extra-axial fluid collection. Calcified atherosclerosis about the skull base. VENTRICLES: No hydrocephalus. ORBITS: Prior bilateral ocular lens replacement. SINUSES AND MASTOIDS: The paranasal sinuses and mastoid air cells are clear. CTA NECK: COMMON CAROTID  ARTERIES: Atherosclerotic change about the right carotid bulb with 45% stenosis by NASCET criteria. Atherosclerotic change about the left carotid bulb with up to 55% by NASCET criteria. No dissection or occlusion. INTERNAL CAROTID ARTERIES: Atheromatous change present about the carotid siphons bilaterally, right worse than left. There is resultant moderate stenosis at the periclinoid right ICA (series 11, image 108). No significant stenosis about the left siphon. 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 No dissection or occlusion. VERTEBRAL ARTERIES: Both vertebral arteries were also noted. Left vertebral artery dominant. Atherosclerotic plaque at the origin of the right vertebral artery without significant stenosis. No dissection or occlusion. SUBCLAVIAN ARTERIES: Subclavian arteries noted. CTA HEAD: ANTERIOR CEREBRAL ARTERIES: A1 segments patent bilaterally. Normal anterior communicating artery complex. Both ACAs patent without significant stenosis. No occlusion. No aneurysm. MIDDLE CEREBRAL ARTERIES: No M1 stenosis. No proximal MCA branch occlusion or high-grade stenosis. Distal middle cerebral branches perfusion is symmetric. No significant  beading or irregularity. No occlusion. No aneurysm. POSTERIOR CEREBRAL ARTERIES: Atheromatous irregularity about the V4 segments without significant stenosis. Both PICAs patent. Basilar patent without stenosis. Superior cerebellar and posterior cerebral arteries patent bilaterally. No beading or irregularity. No occlusion. No aneurysm. BASILAR ARTERY: Basilar patent without stenosis. No occlusion. No aneurysm. OTHER: SOFT TISSUES: No acute finding. No masses or lymphadenopathy. Upper lobe dominant centrilobular emphysema. 8 mm irregular pulmonary nodule present at the right upper lobe (series 7, image 21). Mild aortic atherosclerosis. BONES: No worrisome osseous lesions. Exaggeration of the normal upper  thoracic kyphosis. Patient is edentulous. Calcified atherosclerosis about the skull base. Major dural sinuses are grossly patent, allowing for timing of contrast bolus. IMPRESSION: 1. Stable head CT. Previously identified parenchymal changes identified on prior MRI are not well visualized by CT. No other acute intracranial abnormality. 2. Negative CTA for large vessel occlusion or other emergent findings. No CTA evidence for active vasculitis. 3. Atherosclerotic disease about the carotid bifurcations bilaterally with 45% stenosis on the right and 55% stenosis on the left. 4. Moderate atheromatous stenosis at the paraclinoid right ICA. 5. 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 6. Emphysema. 7. 8 mm irregular right upper lobe pulmonary nodule detected on incomplete chest nation CT. Follow-up examination with prompt chest CT recommended for further evaluation. 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 Would expect some pleocytosis.  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 Continue on acyclovir IV  Will add on varicella Ig G onto CSF Continue to monitor renal function while on acyclovir due to risk of AKI Renal toxicity Will review his improvement to IV acyclovir to decide on length of therapy  Failure to thrive= have nutritionist eval and treat  Deconditioning = to work with pt/ot  evaluation of this patient requires complex antimicrobial therapy evaluation and counseling and isolation needs for disease transmission risk assessment and mitigation.   I personally spent a total of 81 minutes in the care of the patient today including preparing to see the patient, getting/reviewing separately obtained history,  performing a medically appropriate exam/evaluation, placing orders, documenting clinical information in the EHR, independently interpreting results, and communicating results.

## 2024-09-11 NOTE — Progress Notes (Signed)
 Nutrition Follow-up  DOCUMENTATION CODES:   Severe malnutrition in context of chronic illness, Underweight  INTERVENTION:   Regular diet, encourage PO intake  Boost Breeze po TID, each supplement provides 250 kcal and 9 grams of protein. Magic cup TID with meals, each supplement provides 290 kcal and 9 grams of protein. MVI with minerals daily. If intake does not continue to improve and within GOC, Recommend Cortrak placement and initiation of tube feeding: Osmolite 1.5 at 20 ml/h, increase by 10 ml every 12 hours to goal rate of 40 ml/h with Prosource TF20 60 ml daily would provide 1520 kcal, 80 gm protein, 732 ml free water daily.   NUTRITION DIAGNOSIS:   Severe Malnutrition related to chronic illness as evidenced by energy intake < or equal to 50% for > or equal to 1 month, severe fat depletion, severe muscle depletion, percent weight loss. - new dx 11/3  GOAL:   Patient will meet greater than or equal to 90% of their needs - not met, progressing   MONITOR:   PO intake, Supplement acceptance  REASON FOR ASSESSMENT:   Consult Assessment of nutrition requirement/status  ASSESSMENT:   83 yo male admitted with failure to thrive. 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  Pt seen in room, son and daughter at bedside. They report poor PO since July when duodenal mass was diagnosed but worsen over the past 6 week since shingles flare up. Pt would only take a few bites or spoonful of a meal at at time, c/o of not being able to taste anything and lack of appetite. They report patient did significantly better with intake today compared to recent baseline at home. Lunch tray at bedside, 50% sandwich, 50% magic cup and 100% juice consumed.  RD assisted in ordering patient's dinner for tonight. Pt has been drinking ONS since it was changed to boost  breeze. No reports of nausea or abd pain with meals.   Admit weight: 51.2 kg  Current weight: 45.6 kg Patient with 18% weight loss within the past 3 months  Nutritionally Relevant Medications: MVI w/minerals, protonix , miralax, senokot   Labs Reviewed: Sodium 125, Cl 93, Calcium 7.9    NUTRITION - FOCUSED PHYSICAL EXAM:  Flowsheet Row Most Recent Value  Orbital Region Moderate depletion  Upper Arm Region Severe depletion  Thoracic and Lumbar Region Moderate depletion  Buccal Region Severe depletion  Temple Region Severe depletion  Clavicle Bone Region Severe depletion  Clavicle and Acromion Bone Region Severe depletion  Scapular Bone Region Moderate depletion  Dorsal Hand Moderate depletion  Patellar Region Moderate depletion  Anterior Thigh Region Severe depletion  Posterior Calf Region Severe depletion  Hair Reviewed  Eyes Reviewed  Mouth Reviewed  Skin Reviewed  Nails Reviewed    Diet Order:   Diet Order             Diet regular Room service appropriate? Yes; Fluid consistency: Thin  Diet effective now                   EDUCATION NEEDS:   Not appropriate for education at this time  Skin:  Skin Assessment: Reviewed RN Assessment  Last BM:  10/31  Height:   Ht Readings from Last 1 Encounters:  09/08/24 5' 7 (1.702 m)    Weight:   Wt Readings from Last 1 Encounters:  09/08/24 45.6 kg    Ideal  Body Weight:  67.3 kg  BMI:  Body mass index is 15.75 kg/m.  Estimated Nutritional Needs:   Kcal:  1500-1700  Protein:  75-90 gm  Fluid:  1.5-1.7 L   Pamela Intrieri, MS, RD, LDN Clinical Dietitian  Contact via secure chat. If unavailable, use group chat RD Inpatient.

## 2024-09-11 NOTE — Progress Notes (Signed)
 NEUROLOGY CONSULT FOLLOW UP NOTE   Date of service: September 11, 2024 Patient Name: Noah Burnett MRN:  [id number] DOB:  1941-08-24  Interval Hx/subjective  Patient has remained hemodynamically stable and afebrile overnight.  MRI reveals multiple small enhancing and non-enhancing lesions, most likely infarctions, suspicious for VZV encephalitis. Patient is being treated appropriately with acyclovir.  Vitals   Vitals:   09/10/24 1946 09/10/24 2313 09/11/24 0405 09/11/24 0815  BP: 124/61 (!) 106/59 124/62 (!) 107/55  Pulse: (!) 48 (!) 53 (!) 42 (!) 48  Resp: 16 16 15 15   Temp: 99.4 F (37.4 C) (!) 97.4 F (36.3 C) 97.7 F (36.5 C) 98 F (36.7 C)  TempSrc: Oral Oral Oral Oral  SpO2: 100% 100% 100% 100%  Weight:      Height:         Body mass index is 15.75 kg/m.  Physical Exam   General: Well-nourished, well-developed elderly patient resting comfortably with eyes open HEENT: Normocephalic atraumatic Lungs: Respirations regular and unlabored on room air   NEURO:  Mental Status: Patient is alert and oriented to person and place, states month is October Speech/Language: Speech is with mild dysarthria and in single words and short phrases Cranial Nerves:  II: PERRL.  III, IV, VI: EOMI. Eyelids elevate symmetrically.  V: Sensation is intact to light touch and more sensitive on the left VII: Subtle right facial droop VIII: Hearing intact to voice. IX, X: Mildly dysarthric XII: tongue is midline without fasciculations. Motor: Able to move all 4 extremities with symmetrical antigravity strength Tone is normal and bulk is normal Sensation- Intact to light touch bilaterally.  Coordination: FTN intact bilaterally Gait- deferred   Medications  Current Facility-Administered Medications:    0.9 %  sodium chloride  infusion, , Intravenous, Continuous, Akula, Vijaya, MD   acetaminophen  (TYLENOL ) tablet 650 mg, 650 mg, Oral, Q6H PRN, Tat, David, MD   acyclovir (ZOVIRAX) 455 mg  in dextrose  5 % 100 mL IVPB, 10 mg/kg, Intravenous, Q12H, Tanda Dempsey SAUNDERS, RPH, Last Rate: 109.1 mL/hr at 09/10/24 2051, 455 mg at 09/10/24 2051   aspirin EC tablet 81 mg, 81 mg, Oral, Daily, Arora, Ashish, MD, 81 mg at 09/10/24 2033   enoxaparin (LOVENOX) injection 30 mg, 30 mg, Subcutaneous, Q24H, Tat, David, MD, 30 mg at 09/10/24 2033   feeding supplement (BOOST / RESOURCE BREEZE) liquid 1 Container, 1 Container, Oral, TID BM, Tat, David, MD   multivitamin with minerals tablet 1 tablet, 1 tablet, Oral, Daily, Tat, David, MD, 1 tablet at 09/10/24 2032   ondansetron  (ZOFRAN ) tablet 4 mg, 4 mg, Oral, Q6H PRN **OR** ondansetron  (ZOFRAN ) injection 4 mg, 4 mg, Intravenous, Q6H PRN, Tat, David, MD   pantoprazole  (PROTONIX ) EC tablet 40 mg, 40 mg, Oral, Daily, Tat, David, MD, 40 mg at 09/10/24 2032   polyethylene glycol (MIRALAX / GLYCOLAX) packet 17 g, 17 g, Oral, Daily, Tat, David, MD, 17 g at 09/10/24 2033   rosuvastatin (CRESTOR) tablet 10 mg, 10 mg, Oral, Daily, Arora, Ashish, MD, 10 mg at 09/10/24 2032   senna (SENOKOT) tablet 17.2 mg, 2 tablet, Oral, Daily, Tat, David, MD, 17.2 mg at 09/10/24 2033  Labs and Diagnostic Imaging   CBC:  Recent Labs  Lab 09/10/24 0255 09/11/24 0258  WBC 8.5 6.1  NEUTROABS 6.2 4.2  HGB 14.6 13.4  HCT 42.1 37.5*  MCV 87.3 85.4  PLT 214 183    Basic Metabolic Panel:  Lab Results  Component Value Date   NA 125 (L)  09/11/2024   K 3.6 09/11/2024   CO2 21 (L) 09/11/2024   GLUCOSE 85 09/11/2024   BUN 12 09/11/2024   CREATININE 0.67 09/11/2024   CALCIUM 7.9 (L) 09/11/2024   GFRNONAA >60 09/11/2024   GFRAA >60 03/27/2018   Lipid Panel:  Lab Results  Component Value Date   LDLCALC 75 09/10/2024   HgbA1c:  Lab Results  Component Value Date   HGBA1C 5.6 09/10/2024    CSF results: Tube #1: 2 WBCs, 12 RBCs, protein 65, glucose 44 Tube #4: 1 WBC, 0 RBC Meningitis encephalitis panel negative Cytology pending Flow cytometry pending Autoimmune  panel/paraneoplastic panel pending VDRL pending CSF Gram stain - WBC present with no organisms VZV PCR serum pending VZV PCR CSF pending [facility] spotted fever antibodies pending  MRI brain: multiple subcortical contrast-enhancing areas greater in the right parieto-occipital fissure but present at multiple subcortical locations in both MCAs. Small areas of diffusion abnormality at the anterior insula on the left, left frontal operculum and right frontal operculum in multiple locations in both corona radiata and some lesions appearing more acute than others based on lower ADC values. Differentials include infectious versus inflammatory encephalitis, VZV or other vasculitis, as well as recent embolic showers, with demyelinating disease being lower on the DDx  Routine EEG: Intermittent generalized slowing  Assessment  [name] is an 83 y.o. male presenting for progressive confusion after having gone through some procedures for GI issues for duodenal mass-not sure if there is any malignancy-family reports no malignancy.  MRI was performed which is concerning for enhancing and nonenhancing lesions with broad differential which includes VZV vasculitis/encephalitis, inflammatory, other infectious, and paraneoplastic processes. Also of note-had shingles infection which was treated, so a viral encephalitis is felt to be highest on the DDx. Per the literature, approximately 1/400 cases of VZV reactivation present with VZV encephalitis.  - Already on acyclovir for viral encephalitis coverage. - As of right now, only glucose, protein and cell count have been reported.  Cell count is not suspicious for a meningitis, but does not rule out a vasculitis.  He does have increased protein in CSF. Overall, MRI contrast-enhancing lesions are most suspicious for VZV encephalitis/vasculitis, and patient has improved since starting acyclovir.  Will continue acyclovir and consult ID for possible VZV  encephalitis.   - Patient does have a history of [place] spotted fever a few years ago which was appropriately treated per chart and family and his MRI brain is also compatible with this.  However, although RMSF could also cause encephalitis, it would be unlikely that this would happen several years after diagnosis and treatment.  Patient's family states that when he is in his normal state of health, he is often outdoors and may have had an unnoticed tick bite.  However, he has been less active since his GI issues began in July. 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    Recommendations  # For the acute and subacute small vessel strokes on the MRI - 2D echo pending, A1c at goal below 6, LDL near goal.  Goal is less than 70, his LDL is 75. - Consider statin - He has had his fluoro-guided LP, can start him on aspirin 81 - Encephalopathy and strokes may be able to tie down together with 1 diagnosis of vasculitis, especially VZV vasculitis given recent shingles.  Will send VZV PCR serum and CSF as  meningitis/encephalitis panel was negative  # For the encephalopathy - Await full testing results from CSF - Continue acyclovir - No need for antiseizure medications-no evidence of seizure and EEG unremarkable  ______________________________________________________________________  Patient seen by NP. Cortney E Everitt Clint Kill , MSN, AGACNP-BC [facility] See Amion for schedule and pager information 09/11/2024 9:22 AM  Electronically signed: Dr. Tyrell Seifer

## 2024-09-11 NOTE — Plan of Care (Signed)
 In better spirits today.  This nurse did try to get him to get into the chair but he refused.  Has had a room full of family and is more interactive today.  Daughter very excited about his progress.    Problem: Clinical Measurements: Goal: Diagnostic test results will improve Outcome: Progressing   Problem: Clinical Measurements: Goal: Respiratory complications will improve Outcome: Progressing   Problem: Activity: Goal: Risk for activity intolerance will decrease Outcome: Progressing   Problem: Nutrition: Goal: Adequate nutrition will be maintained Outcome: Progressing   Problem: Safety: Goal: Ability to remain free from injury will improve Outcome: Progressing   Problem: Nutrition Goal: Patient maintains adequate hydration Outcome: Progressing

## 2024-09-11 NOTE — Progress Notes (Signed)
 [facility] night cross cover note:   Pt with loose stool, but no fever, nor any leukocytosis, and no report of any associated abd pain. I subsequently ordered as needed Imodium.    Eva Pore, DO Hospitalist

## 2024-09-11 NOTE — Progress Notes (Signed)
 [facility]                                                                               Noah Burnett, is a 83 y.o. 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  Patient had hospital admission at [facility] from 8/18 to 07/09/24 when he presented with nausea and vomiting.  He was found to have questionable obstruction and duodenal mass. He had an EGD that showed an hiatal hernia and compression of the second portion of the duodenum with a possible intraluminal neoplasm. Biopsies were obtained which showed severely inflamed and ulcerated duodenal mucosa, but no evidence of malignancy. A second EGD was done with ultrasound guidance, but again pathology of the lesion extensive necrosis. Surgical oncology was consulted and he underwent a robotic gastrojejunostomy on 8/25. He did have a delayed return of bowel function and had a PICC line with TPN for several days until he had advancement of his diet. Since his hospitalization, he has had a fairly steady functional decline with decreasing appetite, significant weight loss, and generalized weakness.  After discharge, the patient was tolerating a diet and still driving and walking independently.  Unfortunately, the patient developed shingles in the last week of September 2025.  He went to see ophthalmology, and he was cleared from their standpoint.  He finished a course of Valtrex.  He continues to have some postherpetic neuralgia.  Daughter states that since his shingles episode, the patient has had a functional decline with decreasing appetite,  significant weight loss, and generalized weakness.  The patient has followed up with surgical oncology on 08/31/2024.  At that time the patient had reported generalized weakness and fatigue, but he did not have any vomiting or abdominal pain at that time.  He had poor p.o. intake and loss of appetite at that time.  Repeat MRI of the abdomen and pelvis was obtained.  This was performed on 09/06/2024 which showed no specific MR findings to suggest a duodenal mass.  There was Generalized edema-like signal in the retroperitoneum is presumed reactive in the setting of known duodenal ulceration.  There were multiple benign appearing hepatic lesions presumed to be cysts or hemangioma.  There was Bosniak 2 renal cysts.     The patient has not complained of any specific issues.  There has been no fevers, chills, headache, chest pain, shortness breath, nausea, vomiting, diarrhea, abdominal pain.  In fact he has been struggling with constipation. Daughter states that in the past week he has had increasing confusion and generalized weakness with continued decreased oral intake.  UA was negative for pyuria.  CT of the brain was negative for acute findings.  Acute abdominal series was negative for obstruction or pneumoperitoneum. The patient was started on IV fluids for hyponatremia.    Assessment & Plan  Assessment and Plan:   Failure to thrive/acute metabolic encephalopathy MRI of the brain shows multiple areas of subcortical enhancement in the parieto-occipital lobe.  Differential include encephalitis versus vasculitis versus demyelinating disease Neurology consulted and patient was transferred from [facility] to [facility]. Patient underwent fluoroscopic guided lumbar puncture and fluid sent for analysis for evaluation of meningitis and encephalitis. Procalcitonin is negative, CRP is 4.1.  Patient is alert, oriented to self only.  CRP IS 7.2  No signs of infection.  Echocardiogram shows preserved  LVEF, with grade 1 diastolic dysfunction.      Hyponatremia History of chronic hyponatremia. Sodium in August was 129.  Patient was admitted with a sodium of 126 on 10/21. TSH slightly elevated. Check am cortisol level in am.  Serum osmo is 257, urine sodium is 137, urine osmolality is 490.  Nephrology consulted for hyponatremia.  Salt tablets ordered.      GERD Continue with PPI    Hypertension Blood pressure parameters are well controlled.    RN Pressure Injury Documentation:    Malnutrition Type:  Nutrition Problem: Increased nutrient needs Etiology: acute illness   Malnutrition Characteristics:  Signs/Symptoms: estimated needs   Nutrition Interventions:  Interventions: Boost Breeze, Magic cup, MVI  Estimated body mass index is 15.75 kg/m as calculated from the following:   Height as of this encounter: 5' 7 (1.702 m).   Weight as of this encounter: 45.6 kg.  Code Status: full code.  DVT Prophylaxis:  enoxaparin (LOVENOX) injection 30 mg Start: 09/09/24 2200   Level of Care: Level of care: Telemetry Family Communication: son at bedside.   Disposition Plan:     Remains inpatient appropriate:  hyponatremia , CIR when stable.   Procedures:  LP  Consultants:   Neurology Nephrology   Antimicrobials:   Anti-infectives (From admission, onward)    Start     Dose/Rate Route Frequency Ordered Stop   09/09/24 1500  acyclovir (ZOVIRAX) 455 mg in dextrose  5 % 100 mL IVPB        10 mg/kg  45.6 kg 109.1 mL/hr over 60 Minutes Intravenous Every 12 hours 09/09/24 1331          Medications  Scheduled Meds:  aspirin EC  81 mg Oral Daily   enoxaparin (LOVENOX) injection  30 mg Subcutaneous Q24H   feeding supplement  1 Container Oral TID BM   multivitamin with minerals  1 tablet Oral Daily   pantoprazole   40 mg Oral Daily   polyethylene glycol  17 g Oral Daily   rosuvastatin  10 mg Oral Daily   senna  2 tablet Oral Daily   sodium chloride   1 g  Oral BID WC   Continuous Infusions:  sodium chloride  100 mL/hr at 09/11/24 1053   sodium chloride      acyclovir (ZOVIRAX) 455 mg in dextrose  5 % 100 mL IVPB 455 mg (09/11/24 1113)   PRN Meds:.acetaminophen , ondansetron  **OR** ondansetron  (ZOFRAN ) IV    Subjective:   Noah Burnett was seen and examined today.  No events overnight.   Objective:   Vitals:   09/10/24 2313 09/11/24 0405 09/11/24 0815 09/11/24 1102  BP: (!) 106/59 124/62 (!) 107/55 108/63  Pulse: (!) 53 (!) 42 (!) 48 (!) 52  Resp: 16 15 15 19   Temp: (!) 97.4 F (36.3 C) 97.7 F (36.5 C) 98 F (36.7 C) (!) 97.5 F (36.4 C)  TempSrc: Oral Oral Oral Oral  SpO2: 100% 100% 100% 100%  Weight:  Height:        Intake/Output Summary (Last 24 hours) at 09/11/2024 1326 Last data filed at 09/11/2024 0408 Gross per 24 hour  Intake 480 ml  Output 100 ml  Net 380 ml   Filed Weights   09/08/24 1138 09/08/24 1626  Weight: 51.2 kg 45.6 kg     Exam General exam: Appears calm and comfortable  Respiratory system: Clear to auscultation. Respiratory effort normal. Cardiovascular system: S1 & S2 heard, RRR.  Gastrointestinal system: Abdomen is nondistended, soft and nontender Central nervous system: Alert , oriented to self only, confused.  Extremities: no pedal edema.  Skin: No rashes,  Psychiatry: unable to assess.      Data Reviewed:  I have personally reviewed following labs and imaging studies   CBC Lab Results  Component Value Date   WBC 6.1 09/11/2024   RBC 4.39 09/11/2024   HGB 13.4 09/11/2024   HCT 37.5 (L) 09/11/2024   MCV 85.4 09/11/2024   MCH 30.5 09/11/2024   PLT 183 09/11/2024   MCHC 35.7 09/11/2024   RDW 16.1 (H) 09/11/2024   LYMPHSABS 1.1 09/11/2024   MONOABS 0.7 09/11/2024   EOSABS 0.1 09/11/2024   BASOSABS 0.0 09/11/2024     Last metabolic panel Lab Results  Component Value Date   NA 125 (L) 09/11/2024   K 3.6 09/11/2024   CL 93 (L) 09/11/2024   CO2 21 (L) 09/11/2024    BUN 12 09/11/2024   CREATININE 0.67 09/11/2024   GLUCOSE 85 09/11/2024   GFRNONAA >60 09/11/2024   GFRAA >60 03/27/2018   CALCIUM 7.9 (L) 09/11/2024   PHOS 3.6 09/10/2024   PROT 4.7 (L) 09/11/2024   ALBUMIN 2.6 (L) 09/11/2024   BILITOT 1.6 (H) 09/11/2024   ALKPHOS 58 09/11/2024   AST 12 (L) 09/11/2024   ALT 9 09/11/2024   ANIONGAP 11 09/11/2024    CBG (last 3)  Recent Labs    09/09/24 1201 09/09/24 1242  GLUCAP 68* 141*      Coagulation Profile: Recent Labs  Lab 09/10/24 0255  INR 1.0     Radiology Studies: ECHOCARDIOGRAM COMPLETE Result Date: 09/10/2024    ECHOCARDIOGRAM REPORT   Patient Name:   Noah Burnett Date of Exam: 09/10/2024 Medical Rec #:  [id number]        Height:       67.0 in Accession #:    [id number]       Weight:       100.5 lb Date of Birth:  01/09/1941        BSA:          1.510 m Patient Age:    83 years         BP:           112/57 mmHg Patient Gender: M                HR:           59 bpm. Exam Location:  Inpatient Procedure: 2D Echo, 3D Echo, Cardiac Doppler, Color Doppler and Strain Analysis            (Both Spectral and Color Flow Doppler were utilized during            procedure). Indications:    Stroke  History:        Patient has no prior history of Echocardiogram examinations.                 Risk Factors:Hypertension.  Sonographer:  Philomena Daring Referring Phys: [id number] ASHISH ARORA  Sonographer Comments: Global longitudinal strain was attempted. IMPRESSIONS  1. Left ventricular ejection fraction, by estimation, is 55%. Left ventricular ejection fraction by 3D volume is 52 %. The left ventricle has normal function. The left ventricle has no regional wall motion abnormalities. Left ventricular diastolic parameters are consistent with Grade I diastolic dysfunction (impaired relaxation). The average left ventricular global longitudinal strain is -13.3 %.  2. Right ventricular systolic function is normal. The right ventricular size is normal. Tricuspid  regurgitation signal is inadequate for assessing PA pressure.  3. The mitral valve is normal in structure. Trivial mitral valve regurgitation. No evidence of mitral stenosis.  4. The aortic valve is tricuspid. Aortic valve regurgitation is mild. No aortic stenosis is present.  5. The inferior vena cava is normal in size with greater than 50% respiratory variability, suggesting right atrial pressure of 3 mmHg. Comparison(s): No prior Echocardiogram. FINDINGS  Left Ventricle: Left ventricular ejection fraction, by estimation, is 55%. Left ventricular ejection fraction by 3D volume is 52 %. The left ventricle has normal function. The left ventricle has no regional wall motion abnormalities. The average left ventricular global longitudinal strain is -13.3 %. Strain was performed and the global longitudinal strain is indeterminate. The left ventricular internal cavity size was normal in size. There is no left ventricular hypertrophy. Left ventricular diastolic parameters are consistent with Grade I diastolic dysfunction (impaired relaxation). Normal left ventricular filling pressure. Right Ventricle: The right ventricular size is normal. No increase in right ventricular wall thickness. Right ventricular systolic function is normal. Tricuspid regurgitation signal is inadequate for assessing PA pressure. Left At[facility]: Left atrial size was normal in size. Right [facility]: Right atrial size was normal in size. Pericardium: There is no evidence of pericardial effusion. Mitral Valve: The mitral valve is normal in structure. Trivial mitral valve regurgitation. No evidence of mitral valve stenosis. Tricuspid Valve: The tricuspid valve is normal in structure. Tricuspid valve regurgitation is not demonstrated. No evidence of tricuspid stenosis. Aortic Valve: The aortic valve is tricuspid. Aortic valve regurgitation is mild. No aortic stenosis is present. Pulmonic Valve: The pulmonic valve was not well visualized. Pulmonic valve  regurgitation is not visualized. No evidence of pulmonic stenosis. Aorta: The aortic root and ascending aorta are structurally normal, with no evidence of dilitation. Venous: The inferior vena cava is normal in size with greater than 50% respiratory variability, suggesting right atrial pressure of 3 mmHg. IAS/Shunts: No atrial level shunt detected by color flow Doppler. Additional Comments: 3D was performed not requiring image post processing on an independent workstation and was abnormal.  LEFT VENTRICLE PLAX 2D LVIDd:         4.00 cm         Diastology LVIDs:         2.80 cm         LV e' medial:  4.57 cm/s LV PW:         1.10 cm         LV e' lateral: 7.94 cm/s LV IVS:        1.10 cm LVOT diam:     2.00 cm         2D Longitudinal LV SV:         59              Strain LV SV Index:   39              2D Strain GLS   -  13.3 % LVOT Area:     3.14 cm        Avg:                                 3D Volume EF                                LV 3D EF:    Left                                             ventricul                                             ar                                             ejection                                             fraction                                             by 3D                                             volume is                                             52 %.                                 3D Volume EF:                                3D EF:        52 %                                LV EDV:       84 ml                                LV ESV:       40 ml                                LV  SV:        43 ml RIGHT VENTRICLE             IVC RV Basal diam:  2.80 cm     IVC diam: 1.20 cm RV Mid diam:    2.50 cm RV S prime:     12.50 cm/s TAPSE (M-mode): 1.6 cm LEFT ATRIUM             Index        RIGHT ATRIUM          Index LA diam:        3.40 cm 2.25 cm/m   RA Area:     9.48 cm LA Vol (A2C):   35.1 ml 23.25 ml/m  RA Volume:   14.90 ml 9.87 ml/m LA Vol (A4C):   37.9 ml  25.11 ml/m LA Biplane Vol: 38.1 ml 25.24 ml/m  AORTIC VALVE LVOT Vmax:   95.40 cm/s LVOT Vmean:  61.300 cm/s LVOT VTI:    0.188 m  AORTA Ao Root diam: 3.20 cm Ao Asc diam:  3.00 cm MITRAL VALVE MV Area (PHT): 2.48 cm    SHUNTS MV Decel Time: 306 msec    Systemic VTI:  0.19 m MV A velocity: 84.40 cm/s  Systemic Diam: 2.00 cm Vishnu Priya Mallipeddi Electronically signed by Diannah Late Mallipeddi Signature Date/Time: 09/10/2024/4:39:28 PM    Final    DG FL GUIDED LUMBAR PUNCTURE Result Date: 09/10/2024 CLINICAL DATA:  83 year old male with a history of progressively worsening confusion, lethargy, and decreasing oral intake. Noticed to have worsening generalized declined following shingles outbreak. Request for lumbar puncture to rule out HSV encephalitis. EXAM: LUMBAR PUNCTURE UNDER FLUOROSCOPY PROCEDURE: An appropriate skin entry site was determined fluoroscopically. Operator donned sterile gloves and mask. Skin site was marked, then prepped with Betadine, draped in usual sterile fashion, and infiltrated locally with 1% lidocaine . A 20 gauge spinal needle advanced into the thecal sac at L5-S1 from a right interlaminar approach. Clear colorless CSF spontaneously returned, with opening pressure of 13 cm water. 15 ml CSF were collected and divided among 4 sterile vials for the requested laboratory studies. The needle was then removed. The patient tolerated the procedure well and there were no complications. FLUOROSCOPY: Radiation Exposure Index (as provided by the fluoroscopic device): 2.7 mGy Kerma IMPRESSION: Technically successful lumbar puncture under fluoroscopy. This exam was performed by [facility] PA-C, and was supervised and interpreted by Dr. Ree Molt. Electronically Signed   By: Ree Molt M.D.   On: 09/10/2024 14:00   EEG adult Result Date: 09/10/2024 Shelton Arlin KIDD, MD     09/10/2024  7:23 AM Patient Name: Noah Burnett MRN: [id number] Epilepsy Attending: Arlin KIDD Shelton  Referring Physician/Provider: Voncile [place], MD Date: 09/10/2024 Duration: 25.59 mins Patient history: 83yo M with ams. EEG to evaluate for seizure Level of alertness: Awake, asleep AEDs during EEG study: None Technical aspects: This EEG study was done with scalp electrodes positioned according to the 10-20 International system of electrode placement. Electrical activity was reviewed with band pass filter of 1-70Hz , sensitivity of 7 uV/mm, display speed of 5mm/sec with a 60Hz  notched filter applied as appropriate. EEG data were recorded continuously and digitally stored.  Video monitoring was available and reviewed as appropriate. Description: The posterior dominant rhythm consists of 8-9Hz  activity of moderate voltage (25-35 uV) seen predominantly in posterior head regions, symmetric and reactive to eye opening and eye closing. Sleep was characterized by vertex waves, sleep spindles (12 to 14  Hz), maximal frontocentral region. There is intermittent generalized 3 to 6 Hz theta-delta slowing. Hyperventilation and photic stimulation were not performed.   ABNORMALITY - Intermittent slow, generalized IMPRESSION: This study is suggestive of diffuse cerebral dysfunction (encephalopathy). No seizures or epileptiform discharges were seen throughout the recording. Priyanka MALVA Krebs   CT ANGIO HEAD NECK W WO CM Result Date: 09/10/2024 EXAM: CTA Head and Neck with Intravenous Contrast. CT Head without Contrast. CLINICAL HISTORY: Neuro deficit, acute, stroke suspected. TECHNIQUE: Axial CTA images of the head and neck performed with intravenous contrast. MIP reconstructed images were created and reviewed. Axial computed tomography images of the head/brain performed without intravenous contrast. Note: Per PQRS, the description of internal carotid artery percent stenosis, including 0 percent or normal exam, is based on [place] American Symptomatic Carotid Endarterectomy Trial (NASCET) criteria. Dose reduction technique was used  including one or more of the following: automated exposure control, adjustment of mA and kV according to patient size, and/or iterative reconstruction. CONTRAST: Without and with; 75 mL (iohexol  (OMNIPAQUE ) 350 MG/ML injection 75 mL IOHEXOL  350 MG/ML SOLN). COMPARISON: MRI from 09/09/2024 and CT from 09/08/2024. FINDINGS: CT HEAD: BRAIN: Changes of atrophy with chronic microvascular ischemic disease noted. Previously identified parenchymal changes are not well visualized by CT. No acute intracranial hemorrhage. No further acute large vessel territory infarct. No mass lesion or midline shift. No hydrocephalus or extra-axial fluid collection. Calcified atherosclerosis about the skull base. VENTRICLES: No hydrocephalus. ORBITS: Prior bilateral ocular lens replacement. SINUSES AND MASTOIDS: The paranasal sinuses and mastoid air cells are clear. CTA NECK: COMMON CAROTID ARTERIES: Atherosclerotic change about the right carotid bulb with 45% stenosis by NASCET criteria. Atherosclerotic change about the left carotid bulb with up to 55% by NASCET criteria. No dissection or occlusion. INTERNAL CAROTID ARTERIES: Atheromatous change present about the carotid siphons bilaterally, right worse than left. There is resultant moderate stenosis at the periclinoid right ICA (series 11, image 108). No significant stenosis about the left siphon. 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 No dissection or occlusion. VERTEBRAL ARTERIES: Both vertebral arteries were also noted. Left vertebral artery dominant. Atherosclerotic plaque at the origin of the right vertebral artery without significant stenosis. No dissection or occlusion. SUBCLAVIAN ARTERIES: Subclavian arteries noted. CTA HEAD: ANTERIOR CEREBRAL ARTERIES: A1 segments patent bilaterally. Normal anterior communicating artery complex. Both ACAs patent without significant stenosis. No occlusion. No aneurysm. MIDDLE  CEREBRAL ARTERIES: No M1 stenosis. No proximal MCA branch occlusion or high-grade stenosis. Distal middle cerebral branches perfusion is symmetric. No significant beading or irregularity. No occlusion. No aneurysm. POSTERIOR CEREBRAL ARTERIES: Atheromatous irregularity about the V4 segments without significant stenosis. Both PICAs patent. Basilar patent without stenosis. Superior cerebellar and posterior cerebral arteries patent bilaterally. No beading or irregularity. No occlusion. No aneurysm. BASILAR ARTERY: Basilar patent without stenosis. No occlusion. No aneurysm. OTHER: SOFT TISSUES: No acute finding. No masses or lymphadenopathy. Upper lobe dominant centrilobular emphysema. 8 mm irregular pulmonary nodule present at the right upper lobe (series 7, image 21). Mild aortic atherosclerosis. BONES: No worrisome osseous lesions. Exaggeration of the normal upper thoracic kyphosis. Patient is edentulous. Calcified atherosclerosis about the skull base. Major dural sinuses are grossly patent, allowing for timing of contrast bolus. IMPRESSION: 1. Stable head CT. Previously identified parenchymal changes identified on prior MRI are not well visualized by CT. No other acute intracranial abnormality. 2. Negative CTA for large vessel occlusion or other emergent findings. No CTA evidence for active  vasculitis. 3. Atherosclerotic disease about the carotid bifurcations bilaterally with 45% stenosis on the right and 55% stenosis on the left. 4. Moderate atheromatous stenosis at the paraclinoid right ICA. 5. 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 6. Emphysema. 7. 8 mm irregular right upper lobe pulmonary nodule detected on incomplete chest nation CT. Follow-up examination with prompt chest CT recommended for further evaluation. Electronically signed by: Morene Hoard MD 09/10/2024 01:24 AM EDT RP Workstation: [id number]       Elgie Butter M.D. [facility] 09/11/2024, 1:26 PM  Available via Epic  secure chat 7am-7pm After 7 pm, please refer to night coverage provider listed on amion.

## 2024-09-11 NOTE — TOC Initial Note (Signed)
 Transition of Care [facility]) - Initial/Assessment Note    Patient Details  Name: Noah Burnett MRN: [id number] Date of Birth: 1941/03/27  Transition of Care [facility]) CM/SW Contact:    Andrez JULIANNA George, RN Phone Number: 09/11/2024, 11:14 AM  Clinical Narrative:                 Noah Burnett is a 83 y.o. 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   Pt is from home alone but has a caregiver 5 days a week during the daytime. Pt was able to manage himself at night.  Family now is providing transportation.  Daughter assists with his meds.  Family is assisting with meals.  Current recommendations are for CIR.  IP Care management following.  Expected Discharge Plan: IP Rehab Facility Barriers to Discharge: Continued Medical Work up   Patient Goals and CMS Choice   CMS Medicare.gov Compare Post Acute Care list provided to:: Patient Choice offered to / list presented to : Patient, Adult Children      Expected Discharge Plan and Services   Discharge Planning Services: CM Consult Post Acute Care Choice: IP Rehab Living arrangements for the past 2 months: Single Family Home                                      Prior Living Arrangements/Services Living arrangements for the past 2 months: Single Family Home Lives with:: Self Patient language and need for interpreter reviewed:: Yes Do you feel safe going back to the place where you live?: Yes      Need for Family Participation in Patient Care: Yes (Comment) Care giver support system in place?: Yes (comment) Current home services: DME (walker/ wheelchair/ shower seat) Criminal Activity/Legal Involvement Pertinent to Current Situation/Hospitalization: No - Comment as needed  Activities of Daily Living   ADL Screening (condition at time of admission) Independently performs ADLs?: No Does the patient have a NEW difficulty with  bathing/dressing/toileting/self-feeding that is expected to last >3 days?: Yes (Initiates electronic notice to provider for possible OT consult) Does the patient have a NEW difficulty with getting in/out of bed, walking, or climbing stairs that is expected to last >3 days?: Yes (Initiates electronic notice to provider for possible PT consult) Does the patient have a NEW difficulty with communication that is expected to last >3 days?: No Is the patient deaf or have difficulty hearing?: Yes Does the patient have difficulty seeing, even when wearing glasses/contacts?: No Does the patient have difficulty concentrating, remembering, or making decisions?: Yes  Permission Sought/Granted                  Emotional Assessment Appearance:: Appears stated age Attitude/Demeanor/Rapport: Other (comment) (tired) Affect (typically observed): Quiet Orientation: : Oriented to Self, Oriented to Place, Oriented to  Time, Oriented to Situation   Psych Involvement: No (comment)  Admission diagnosis:  Failure to thrive in adult [R62.7] Encephalopathy, unspecified type [G93.40] Patient Active Problem List   Diagnosis Date Noted   Failure to thrive in adult 09/08/2024   H/O bypass gastrojejunostomy 07/24/2024   Gastric outlet obstruction 06/27/2024   Hyponatremia 06/26/2024   Prediabetes 11/20/2021   Essential hypertension 05/08/2021   PCP:  [name], MD Pharmacy:   [id number] - [place], [place] - [address] [place]   [id number] Phone: [phone] Fax: [phone]     Social Drivers of Health (SDOH) Social History: SDOH Screenings   Food Insecurity: No Food Insecurity (09/08/2024)  Housing: Low Risk  (09/08/2024)  Transportation Needs: No Transportation Needs (09/08/2024)  Utilities: Not At Risk (09/08/2024)  Social Connections: Unknown (09/08/2024)  Tobacco Use: Medium Risk (09/08/2024)   SDOH Interventions:     Readmission Risk  Interventions     No data to display

## 2024-09-12 DIAGNOSIS — Z515 Encounter for palliative care: Secondary | ICD-10-CM | POA: Diagnosis not present

## 2024-09-12 DIAGNOSIS — I10 Essential (primary) hypertension: Secondary | ICD-10-CM | POA: Diagnosis not present

## 2024-09-12 DIAGNOSIS — Z7189 Other specified counseling: Secondary | ICD-10-CM | POA: Diagnosis not present

## 2024-09-12 DIAGNOSIS — E43 Unspecified severe protein-calorie malnutrition: Secondary | ICD-10-CM

## 2024-09-12 DIAGNOSIS — G9340 Encephalopathy, unspecified: Secondary | ICD-10-CM | POA: Diagnosis not present

## 2024-09-12 DIAGNOSIS — B0111 Varicella encephalitis and encephalomyelitis: Secondary | ICD-10-CM

## 2024-09-12 DIAGNOSIS — R627 Adult failure to thrive: Secondary | ICD-10-CM | POA: Diagnosis not present

## 2024-09-12 DIAGNOSIS — Z980 Intestinal bypass and anastomosis status: Secondary | ICD-10-CM

## 2024-09-12 LAB — COMPREHENSIVE METABOLIC PANEL WITH GFR
ALT: 14 U/L (ref 0–44)
AST: 21 U/L (ref 15–41)
Albumin: 3 g/dL — ABNORMAL LOW (ref 3.5–5.0)
Alkaline Phosphatase: 66 U/L (ref 38–126)
Anion gap: 14 (ref 5–15)
BUN: 12 mg/dL (ref 8–23)
CO2: 20 mmol/L — ABNORMAL LOW (ref 22–32)
Calcium: 8 mg/dL — ABNORMAL LOW (ref 8.9–10.3)
Chloride: 91 mmol/L — ABNORMAL LOW (ref 98–111)
Creatinine, Ser: 0.76 mg/dL (ref 0.61–1.24)
GFR, Estimated: >60 mL/min (ref >60)
Glucose, Bld: 145 mg/dL — ABNORMAL HIGH (ref 70–99)
Potassium: 3 mmol/L — ABNORMAL LOW (ref 3.5–5.1)
Sodium: 125 mmol/L — ABNORMAL LOW (ref 135–145)
Total Bilirubin: 1.4 mg/dL — ABNORMAL HIGH (ref 0.0–1.2)
Total Protein: 5.6 g/dL — ABNORMAL LOW (ref 6.5–8.1)

## 2024-09-12 LAB — CBC WITH DIFFERENTIAL/PLATELET
Abs Immature Granulocytes: 0.05 K/uL (ref 0.00–0.07)
Basophils Absolute: 0 K/uL (ref 0.0–0.1)
Basophils Relative: 0 %
Eosinophils Absolute: 0.1 K/uL (ref 0.0–0.5)
Eosinophils Relative: 0 %
HCT: 43.6 % (ref 39.0–52.0)
Hemoglobin: 15.5 g/dL (ref 13.0–17.0)
Immature Granulocytes: 0 %
Lymphocytes Relative: 6 %
Lymphs Abs: 0.6 K/uL — ABNORMAL LOW (ref 0.7–4.0)
MCH: 30.1 pg (ref 26.0–34.0)
MCHC: 35.6 g/dL (ref 30.0–36.0)
MCV: 84.7 fL (ref 80.0–100.0)
Monocytes Absolute: 0.9 K/uL (ref 0.1–1.0)
Monocytes Relative: 8 %
Neutro Abs: 9.6 K/uL — ABNORMAL HIGH (ref 1.7–7.7)
Neutrophils Relative %: 86 %
Platelets: 234 K/uL (ref 150–400)
RBC: 5.15 MIL/uL (ref 4.22–5.81)
RDW: 16 % — ABNORMAL HIGH (ref 11.5–15.5)
WBC: 11.2 K/uL — ABNORMAL HIGH (ref 4.0–10.5)
nRBC: 0 % (ref 0.0–0.2)

## 2024-09-12 LAB — CORTISOL-AM, BLOOD: Cortisol - AM: 30.7 ug/dL — ABNORMAL HIGH (ref 6.7–22.6)

## 2024-09-12 LAB — FLOW CYTOMETRY REQUEST - FLUID (INPATIENT): Lymphocyte subsets: UNDETERMINED

## 2024-09-12 LAB — SPOTTED FEVER GROUP ANTIBODIES
Spotted Fever Group IgG: <1:64 {titer}
Spotted Fever Group IgM: <1:64 {titer}

## 2024-09-12 LAB — CYTOLOGY - NON PAP

## 2024-09-12 LAB — VARICELLA ZOSTER ANTIBODY, IGG: Varicella IgG: REACTIVE

## 2024-09-12 LAB — PROCALCITONIN: Procalcitonin: <0.1 ng/mL

## 2024-09-12 LAB — C-REACTIVE PROTEIN: CRP: 3.8 mg/dL — ABNORMAL HIGH (ref <1.0)

## 2024-09-12 LAB — MAGNESIUM: Magnesium: 1.7 mg/dL (ref 1.7–2.4)

## 2024-09-12 MED ORDER — MAGNESIUM SULFATE 2 GM/50ML IV SOLN
2.0000 g | Freq: Once | INTRAVENOUS | Status: AC
  Administered 2024-09-12: 2 g via INTRAVENOUS
  Filled 2024-09-12: qty 50

## 2024-09-12 MED ORDER — POTASSIUM CHLORIDE CRYS ER 20 MEQ PO TBCR
40.0000 meq | EXTENDED_RELEASE_TABLET | Freq: Two times a day (BID) | ORAL | Status: AC
  Administered 2024-09-12 (×2): 40 meq via ORAL
  Filled 2024-09-12 (×2): qty 2

## 2024-09-12 MED ORDER — SODIUM CHLORIDE 0.9 % IV SOLN: INTRAVENOUS | Status: DC

## 2024-09-12 MED ORDER — PANTOPRAZOLE SODIUM 40 MG PO TBEC
40.0000 mg | DELAYED_RELEASE_TABLET | Freq: Two times a day (BID) | ORAL | Status: DC
  Administered 2024-09-12 – 2024-09-15 (×5): 40 mg via ORAL
  Filled 2024-09-12 (×6): qty 1

## 2024-09-12 MED ORDER — SODIUM CHLORIDE 1 G PO TABS
1.0000 g | ORAL_TABLET | Freq: Three times a day (TID) | ORAL | Status: DC
  Administered 2024-09-12 – 2024-09-16 (×12): 1 g via ORAL
  Filled 2024-09-12 (×12): qty 1

## 2024-09-12 NOTE — Progress Notes (Signed)
 Physical Therapy Treatment Patient Details Name: Noah Burnett MRN: [id number] DOB: 02/02/1941 Today's Date: 09/12/2024   History of Present Illness 83 y.o. male [facility]/08/[facility] after unwitnessed fall and AMS. Admitted with failure to thrive after multiple GI procedures. MRI brain concerning for multiple lesions with differentials that include encephalitis versus infectious etiology versus embolic infarcts. EEG showed encephalopathy. PMHx: HTN, prediabetes, GERD, gastric outlet obstruction w/ history of bypass gastrojejunostomy    PT Comments  Pt greeted supine in bed, pleasant and agreeable to therapy session. He is progressing toward established PT goals. Pt advanced OOB mobility, ambulating to/from the sink and transferring to the recliner chair using 2 HHA and minA x2. He demonstrated multiple gait abnormalities including scissoring with LLE crossing over RLE. Pt fatigued quickly demonstrating shakiness in BUE>BLE. He was unsteady and required mod-max cues for initiation, sequencing, and safety awareness. Patient will benefit from intensive inpatient follow-up therapy, >3 hours/day.     If plan is discharge home, recommend the following: A lot of help with bathing/dressing/bathroom;Assistance with cooking/housework;Direct supervision/assist for medications management;Direct supervision/assist for financial management;Assist for transportation;Help with stairs or ramp for entrance;Two people to help with walking and/or transfers   Can travel by private vehicle        Equipment Recommendations  BSC/3in1    Recommendations for Other Services       Precautions / Restrictions Precautions Precautions: Fall Recall of Precautions/Restrictions: Impaired Restrictions Weight Bearing Restrictions Per Provider Order: No     Mobility  Bed Mobility Overal bed mobility: Needs Assistance Bed Mobility: Supine to Sit     Supine to sit: Mod assist     General bed mobility comments:  Pt sat up on L side of bed with increased time. Fair initiation with cues to bring BLE toward EOB and elevate trunk, however, then needed significant assist to scoot hips toward EOB with aid of bed pad.    Transfers Overall transfer level: Needs assistance Equipment used: 2 person hand held assist Transfers: Sit to/from Stand, Bed to chair/wheelchair/BSC Sit to Stand: Min assist, +2 safety/equipment   Step pivot transfers: Min assist, +2 safety/equipment       General transfer comment: Pt stood from lowest bed height. Multimodal cues to initiate anterior weight shift and rise. Light steadying assist with HHA. Faciliated pt to ambulate/transfer. Fair eccentric control.    Ambulation/Gait Ambulation/Gait assistance: Min assist, +2 physical assistance, +2 safety/equipment Gait Distance (Feet): 6 Feet (x2) Assistive device: 2 person hand held assist Gait Pattern/deviations: Step-to pattern, Decreased step length - right, Decreased step length - left, Decreased stride length, Scissoring, Narrow base of support Gait velocity: decreased     General Gait Details: Pt ambulated with short slow steps. PT faciliated anterior and lateral weight shift to advance gait. He demonstrated a NBOS and was unsteady. Intermittent scissoring and one potential episode of L knee buckling. Pt getting progressively shaker with time.   Stairs             Wheelchair Mobility     Tilt Bed    Modified Rankin (Stroke Patients Only)       Balance Overall balance assessment: Needs assistance Sitting-balance support: Feet supported Sitting balance-Leahy Scale: Poor Sitting balance - Comments: fair initially but overall poor with onset fatigue   Standing balance support: Bilateral upper extremity supported, During functional activity Standing balance-Leahy Scale: Poor Standing balance comment: Pt dependent on external support of therapists.  Communication  Communication Communication: Impaired Factors Affecting Communication: Reduced clarity of speech;Difficulty expressing self (pt with improved pronunciation today, however, continues with garbled speech overall)  Cognition Arousal: Alert Behavior During Therapy: Flat affect   PT - Cognitive impairments: Difficult to assess Difficult to assess due to: Impaired communication                     PT - Cognition Comments: Limited verbalizations with pt occasionally nodding yes/no. When attempting to communicate, pt would mumble with incomprehensible speech. Observed R inattention, able to look that direction with TC. Following commands: Impaired Following commands impaired: Follows one step commands inconsistently    Cueing Cueing Techniques: Verbal cues  Exercises      General Comments General comments (skin integrity, edema, etc.): VSS on RA      Pertinent Vitals/Pain Pain Assessment Pain Assessment: Faces Faces Pain Scale: Hurts a little bit Pain Location: Generalized Pain Descriptors / Indicators: Grimacing Pain Intervention(s): Monitored during session, Limited activity within patient's tolerance, Repositioned    Home Living                          Prior Function            PT Goals (current goals can now be found in the care plan section) Acute Rehab PT Goals Patient Stated Goal: unable to state goal PT Goal Formulation: Patient unable to participate in goal setting Time For Goal Achievement: 09/24/24 Potential to Achieve Goals: Fair Progress towards PT goals: Progressing toward goals    Frequency    Min 3X/week      PT Plan      Co-evaluation PT/OT/SLP Co-Evaluation/Treatment: Yes Reason for Co-Treatment: Complexity of the patient's impairments (multi-system involvement);Necessary to address cognition/behavior during functional activity;To address functional/ADL transfers PT goals addressed during session: Mobility/safety with  mobility;Balance        AM-PAC PT 6 Clicks Mobility   Outcome Measure  Help needed turning from your back to your side while in a flat bed without using bedrails?: A Lot Help needed moving from lying on your back to sitting on the side of a flat bed without using bedrails?: A Lot Help needed moving to and from a bed to a chair (including a wheelchair)?: Total Help needed standing up from a chair using your arms (e.g., wheelchair or bedside chair)?: Total Help needed to walk in hospital room?: Total Help needed climbing 3-5 steps with a railing? : Total 6 Click Score: 8    End of Session Equipment Utilized During Treatment: Gait belt Activity Tolerance: Patient tolerated treatment well;Patient limited by fatigue Patient left: in chair;with call bell/phone within reach;with chair alarm set;with family/visitor present Nurse Communication: Mobility status PT Visit Diagnosis: Unsteadiness on feet (R26.81);Other abnormalities of gait and mobility (R26.89);Muscle weakness (generalized) (M62.81);History of falling (Z91.81)     Time: 9062-8991 PT Time Calculation (min) (ACUTE ONLY): 31 min  Charges:    $Therapeutic Activity: 8-22 mins PT General Charges $$ ACUTE PT VISIT: 1 Visit                     Randall SAUNDERS, PT, DPT Acute Rehabilitation Services Office: [phone] Secure Chat Preferred  Noah Burnett 09/12/2024, 11:53 AM

## 2024-09-12 NOTE — Care Management Important Message (Signed)
 Important Message  Patient Details  Name: Noah Burnett MRN: [id number] Date of Birth: 10/20/1941   Important Message Given:  Yes - Medicare IM     Claretta Deed 09/12/2024, 3:00 PM

## 2024-09-12 NOTE — Progress Notes (Signed)
   Palliative Medicine Inpatient Follow Up Note HPI:  83 year old male with a history of prostate cancer (on observation), tobacco abuse, hypertension, GERD, duodenal mass resulting in gastric L obstruction status post robotic gastrojejunostomy 07/03/2024 presenting with decreased oral intake, generalized weakness, and confusion. Palliative care has been asked to support goals of care conversations.   Today's Discussion 09/12/2024  *Please note that this is a verbal dictation therefore any spelling or grammatical errors are due to the Dragon Medical One system interpretation.  Chart reviewed inclusive of vital signs, progress notes of Dr. Cherlyn, Avelina Clause - SLP, Andrez George, RN - TOC, Dr. Melia - Nephrology, laboratory results Na 125, K 3.0, CHL 91, CO2 20, Glu 145, BUN 12, Cr 0.76, Ca 8.0, Anion Gap 14, Mg 1.7, GFR > 60, and diagnostic images.  I met at bedside with Noah Burnett this afternoon. He had worked with PT/OT and is more sleepy this afternoon. His daughter, Noah Burnett is present at bedside. I was able to introduce myself and my role to Noah Burnett. She and I discussed the reasoning for Palliative's involvement to help assert goals.   Noah Burnett and I reviewed the plan for CIR is Noah Burnett is approved for this level of care.   We discussed the option of OP Palliative Care once Noah Burnett is discharged.  Noah Burnett feels Noah Burnett can improve though she is not sure if he will be as functional as he was prior. Allowed her time and space to express her concerns in the setting of Noah Burnett shingles infection and now HSV associated encephalitis.   Questions and concerns addressed/Palliative Support Provided.   Objective Assessment: Vital Signs Vitals:   09/12/24 0724 09/12/24 1119  BP: (!) 151/72 111/62  Pulse: 68 (!) 58  Resp: 15 17  Temp: 97.6 F (36.4 C) (!) 97.5 F (36.4 C)  SpO2: 100% 100%    Intake/Output Summary (Last 24 hours) at 09/12/2024 1324 Last data filed at 09/12/2024 0444 Gross per 24 hour   Intake 1093.56 ml  Output 600 ml  Net 493.56 ml   Last Weight  Most recent update: 09/08/2024  4:29 PM    Weight  45.6 kg (100 lb 8.5 oz)            Gen: Elderly Caucasian male chronically ill in appearance HEENT: Dry mucous membranes CV: Regular rate and rhythm PULM: On room air breathing is even and nonlabored ABD: soft/nontender EXT: No edema Neuro: Arouses, aware of person and place  SUMMARY OF RECOMMENDATIONS   Full code/full scope of care   Allowing time for outcomes --> Patients family would like to see if Noah Burnett can make improvement(s)   Appreciate neurology support and management for encephalopathy   Patient's family plan to be present to support one-on-one feedings   Emotional support provided  CIR candidacy pending insurance authorization    Ongoing palliative care support as needed ______________________________________________________________________________________ Rosaline Becton Boyd Palliative Medicine Team Team Cell Phone: [phone] Please utilize secure chat with additional questions, if there is no response within 30 minutes please call the above phone number  Billing based on MDM: Moderate   Palliative Medicine Team providers are available by phone from 7am to 7pm daily and can be reached through the team cell phone.  Should this patient require assistance outside of these hours, please call the patient's attending physician.

## 2024-09-12 NOTE — Plan of Care (Signed)
 Problem: Education: Goal: Knowledge of General Education information will improve Description: Including pain rating scale, medication(s)/side effects and non-pharmacologic comfort measures Outcome: Progressing   Problem: Health Behavior/Discharge Planning: Goal: Ability to manage health-related needs will improve Outcome: Progressing   Problem: Clinical Measurements: Goal: Ability to maintain clinical measurements within normal limits will improve Outcome: Progressing Goal: Will remain free from infection Outcome: Progressing Goal: Diagnostic test results will improve Outcome: Progressing Goal: Respiratory complications will improve Outcome: Progressing Goal: Cardiovascular complication will be avoided Outcome: Progressing   Problem: Activity: Goal: Risk for activity intolerance will decrease Outcome: Progressing   Problem: Nutrition: Goal: Adequate nutrition will be maintained Outcome: Progressing   Problem: Coping: Goal: Level of anxiety will decrease Outcome: Progressing   Problem: Elimination: Goal: Will not experience complications related to bowel motility Outcome: Progressing Goal: Will not experience complications related to urinary retention Outcome: Progressing   Problem: Pain Managment: Goal: General experience of comfort will improve and/or be controlled Outcome: Progressing   Problem: Safety: Goal: Ability to remain free from injury will improve Outcome: Progressing   Problem: Skin Integrity: Goal: Risk for impaired skin integrity will decrease Outcome: Progressing   Problem: Nutrition Goal: Patient maintains adequate hydration Outcome: Progressing Goal: Patient maintains weight Outcome: Progressing Goal: Patient/Family demonstrates understanding of diet Outcome: Progressing Goal: Patient/Family independently completes tube feeding Outcome: Progressing Goal: Patient will have no more than 5 lb weight change during LOS Outcome:  Progressing Goal: Patient will utilize adaptive techniques to administer nutrition Outcome: Progressing Goal: Patient will verbalize dietary restrictions Outcome: Progressing   Problem: Education: Goal: Ability to describe self-care measures that may prevent or decrease complications (Diabetes Survival Skills Education) will improve Outcome: Progressing Goal: Individualized Educational Video(s) Outcome: Progressing   Problem: Coping: Goal: Ability to adjust to condition or change in health will improve Outcome: Progressing   Problem: Fluid Volume: Goal: Ability to maintain a balanced intake and output will improve Outcome: Progressing   Problem: Health Behavior/Discharge Planning: Goal: Ability to identify and utilize available resources and services will improve Outcome: Progressing Goal: Ability to manage health-related needs will improve Outcome: Progressing   Problem: Metabolic: Goal: Ability to maintain appropriate glucose levels will improve Outcome: Progressing   Problem: Nutritional: Goal: Maintenance of adequate nutrition will improve Outcome: Progressing Goal: Progress toward achieving an optimal weight will improve Outcome: Progressing   Problem: Skin Integrity: Goal: Risk for impaired skin integrity will decrease Outcome: Progressing   Problem: Tissue Perfusion: Goal: Adequacy of tissue perfusion will improve Outcome: Progressing   Problem: Education: Goal: Knowledge of disease or condition will improve Outcome: Progressing Goal: Knowledge of secondary prevention will improve (MUST DOCUMENT ALL) Outcome: Progressing Goal: Knowledge of patient specific risk factors will improve (DELETE if not current risk factor) Outcome: Progressing   Problem: Ischemic Stroke/TIA Tissue Perfusion: Goal: Complications of ischemic stroke/TIA will be minimized Outcome: Progressing   Problem: Coping: Goal: Will verbalize positive feelings about self Outcome:  Progressing Goal: Will identify appropriate support needs Outcome: Progressing   Problem: Health Behavior/Discharge Planning: Goal: Ability to manage health-related needs will improve Outcome: Progressing Goal: Goals will be collaboratively established with patient/family Outcome: Progressing   Problem: Self-Care: Goal: Ability to participate in self-care as condition permits will improve Outcome: Progressing Goal: Verbalization of feelings and concerns over difficulty with self-care will improve Outcome: Progressing Goal: Ability to communicate needs accurately will improve Outcome: Progressing   Problem: Nutrition: Goal: Risk of aspiration will decrease Outcome: Progressing Goal: Dietary intake will improve Outcome: Progressing

## 2024-09-12 NOTE — Progress Notes (Signed)
 Brief Nutrition Note  New consult for nutrition assessment received. Pt is being followed by nutrition team with last full assessment 11/3. Will continue to follow-up as planned and leave current interventions in place.  Vernell Lukes, RD, LDN, CNSC Registered Dietitian II Please reach out via secure chat

## 2024-09-12 NOTE — Progress Notes (Signed)
 Noah Burnett is an 83 y.o. 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  He had a robotic gastrojejunostomy on 8/25 requiring TPN via PICC subsequently.  Patient reportedly has had decreased appetite, weight loss and inability to walk.  Reportedly repeat MRI which did not show the duodenal mass.  Patient brought in for failure to thrive as well as a unwitnessed fall. CT head was negative for large vessel occlusion, no e/o vasculitis, no other acute intracranial abnormality. Denies any NSAID use but he has not been eating much at all for at least a month.   Assessment/Plan:  Hyponatremia - with a chronic component with SNa running at baseline in the 126-129 range. Initial TSH 3.7 but repeat 6.2 FT4 nl., Ur Na 137 and urine osm 490. 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 Cortisol HL, TSH nl and then high but FT4 nl  - Urine Na, K <154 - Incr NaCl tablets to TID; need solute to excrete free water. Gave trial of low dose Lasix to decrease concentration gradient as well.  - If he drops below 120 will need 3% but trying to avoid.   - Will follow closely with you   Hypertension - BP actually on the lower side Failure to thrive - present for over a month AMS -MRI of the brain showed areas of subcortical enhancement in the parietal occipital lobe seen by neurology, LP sent as well. Neurology treating for viral encephalitis empirically.   H/o prostate cancer H/o tobacco use as well  Subjective: Denies f/c/n/v/sob. Niece bedside.   Chemistry and CBC: Creatinine, Ser   Date/Time Value Ref Range Status  09/12/2024 01:58 AM 0.76 0.61 - 1.24 mg/dL Final  88/96/7974 97:41 AM 0.67 0.61 - 1.24 mg/dL Final  88/97/7974 97:44 AM 0.73 0.61 - 1.24 mg/dL Final  88/98/7974 96:66 AM 0.67 0.61 - 1.24 mg/dL Final  89/68/7974 87:70 PM 0.74 0.61 - 1.24 mg/dL Final  89/78/7974 95:93 PM 0.83 0.61 - 1.24 mg/dL Final  91/87/7974 94:53 PM 0.79 0.61 - 1.24 mg/dL Final  96/74/7978 90:52 AM 0.70 0.61 - 1.24 mg/dL Final  94/80/7980 89:96 AM 0.72 0.61 - 1.24 mg/dL Final   Recent Labs  Lab 09/08/24 1229 09/09/24 0333 09/10/24 0255 09/11/24 0258 09/11/24 1829 09/12/24 0158  NA 128* 128* 127* 125* 126* 125*  K 3.4* 3.7 4.1 3.6  --  3.0*  CL 90* 92* 91* 93*  --  91*  CO2 29 22 24  21*  --  20*  GLUCOSE 96 81 95 85  --  145*  BUN 11 9 9 12   --  12  CREATININE 0.74 0.67 0.73 0.67  --  0.76  CALCIUM 8.7* 8.6* 8.3* 7.9*  --  8.0*  PHOS  --   --  3.6  --   --   --    Recent Labs  Lab 09/08/24 1229 09/09/24 0333 09/10/24 0255 09/11/24 0258 09/12/24 0158  WBC 7.5 10.1 8.5 6.1 11.2*  NEUTROABS 4.9  --  6.2 4.2 9.6*  HGB 14.2 16.0 14.6 13.4 15.5  HCT 41.5 48.2 42.1 37.5* 43.6  MCV 88.7 91.6 87.3  85.4 84.7  PLT 229 199 214 183 234   Liver Function Tests: Recent Labs  Lab 09/10/24 0255 09/11/24 0258 09/12/24 0158  AST 14* 12* 21  ALT 11 9 14   ALKPHOS 65 58 66  BILITOT 1.1 1.6* 1.4*  PROT 5.6* 4.7* 5.6*  ALBUMIN 3.0* 2.6* 3.0*   Recent Labs  Lab 09/08/24 1229  LIPASE 27   No results for input(s): AMMONIA in the last 168 hours. Cardiac Enzymes: Recent Labs  Lab 09/10/24 0255  CKTOTAL 30*   Iron Studies: No results for input(s): IRON, TIBC, TRANSFERRIN, FERRITIN in the last 72 hours. PT/INR: @LABRCNTIP (inr:5)  Xrays/Other Studies: ) Results for orders placed or performed during the hospital encounter of 09/08/24 (from the past 48 hours)  Protein and glucose, CSF     Status: Abnormal   Collection Time: 09/10/24 12:45 PM  Result Value Ref  Range   Glucose, CSF 44 40 - 70 mg/dL   Total  Protein, CSF 65 (H) 15 - 45 mg/dL    Comment: Performed at [facility], [address] [place], [place] [address]  CSF cell count with differential collection tube #: 1     Status: Abnormal   Collection Time: 09/10/24 12:45 PM  Result Value Ref Range   Tube # 1    Color, CSF COLORLESS COLORLESS   Appearance, CSF CLEAR CLEAR   Supernatant NOT INDICATED    RBC Count, CSF 12 (H) 0 /cu mm   WBC, CSF 2 0 - 5 /cu mm   Other Cells, CSF TOO FEW TO COUNT, SMEAR AVAILABLE FOR REVIEW     Comment: RARE LYMPH, RARE MONO, RARE EOS Performed at [facility], [address], [place], [place] [address]   CSF cell count with differential collection tube #: 4     Status: None   Collection Time: 09/10/24 12:46 PM  Result Value Ref Range   Tube # 4    Color, CSF COLORLESS COLORLESS   Appearance, CSF CLEAR CLEAR   Supernatant NOT INDICATED    RBC Count, CSF 0 0 /cu mm   WBC, CSF 1 0 - 5 /cu mm   Other Cells, CSF TOO FEW TO COUNT, SMEAR AVAILABLE FOR REVIEW     Comment: RARE LYMPHS, RARE EOS, RARE MONO Performed at [facility], [address], [place], [place] [address]   Sodium, urine, random     Status: None   Collection Time: 09/10/24  3:07 PM  Result Value Ref Range   Sodium, Ur 137 mmol/L    Comment: Performed at [facility], [address], [place], [place] [address]  Osmolality, urine     Status: None   Collection Time: 09/10/24  3:07 PM  Result Value Ref Range   Osmolality, Ur 490 300 - 900 mOsm/kg    Comment: Performed at [facility], [address] [place], [place] [address]  CBC with Differential/Platelet     Status: Abnormal   Collection Time: 09/11/24  2:58 AM  Result Value Ref Range   WBC 6.1 4.0 - 10.5 K/uL   RBC 4.39 4.22 - 5.81 MIL/uL   Hemoglobin 13.4 13.0 - 17.0 g/dL   HCT 62.4 (L) 60.9 - 47.9 %   MCV 85.4 80.0 - 100.0 fL   MCH 30.5 26.0 - 34.0 pg   MCHC 35.7 30.0 - 36.0 g/dL   RDW 83.8  (H) 88.4 - 15.5 %   Platelets 183 150 - 400  K/uL   nRBC 0.0 0.0 - 0.2 %   Neutrophils Relative % 67 %   Neutro Abs 4.2 1.7 - 7.7 K/uL   Lymphocytes Relative 18 %   Lymphs Abs 1.1 0.7 - 4.0 K/uL   Monocytes Relative 11 %   Monocytes Absolute 0.7 0.1 - 1.0 K/uL   Eosinophils Relative 2 %   Eosinophils Absolute 0.1 0.0 - 0.5 K/uL   Basophils Relative 1 %   Basophils Absolute 0.0 0.0 - 0.1 K/uL   Immature Granulocytes 1 %   Abs Immature Granulocytes 0.03 0.00 - 0.07 K/uL    Comment: Performed at [facility], [address], [place], [place] [address]  Comprehensive metabolic panel with GFR     Status: Abnormal   Collection Time: 09/11/24  2:58 AM  Result Value Ref Range   Sodium 125 (L) 135 - 145 mmol/L   Potassium 3.6 3.5 - 5.1 mmol/L   Chloride 93 (L) 98 - 111 mmol/L   CO2 21 (L) 22 - 32 mmol/L   Glucose, Bld 85 70 - 99 mg/dL    Comment: Glucose reference range applies only to samples taken after fasting for at least 8 hours.   BUN 12 8 - 23 mg/dL   Creatinine, Ser 9.32 0.61 - 1.24 mg/dL   Calcium 7.9 (L) 8.9 - 10.3 mg/dL   Total Protein 4.7 (L) 6.5 - 8.1 g/dL   Albumin 2.6 (L) 3.5 - 5.0 g/dL   AST 12 (L) 15 - 41 U/L   ALT 9 0 - 44 U/L   Alkaline Phosphatase 58 38 - 126 U/L   Total Bilirubin 1.6 (H) 0.0 - 1.2 mg/dL   GFR, Estimated >39 >39 mL/min    Comment: (NOTE) Calculated using the CKD-EPI Creatinine Equation (2021)    Anion gap 11 5 - 15    Comment: Performed at [facility], [address] [place], [place] [address]  Magnesium     Status: None   Collection Time: 09/11/24  2:58 AM  Result Value Ref Range   Magnesium 1.8 1.7 - 2.4 mg/dL    Comment: Performed at [facility], [address] [place], [place] [address]  Procalcitonin     Status: None   Collection Time: 09/11/24  2:58 AM  Result Value Ref Range   Procalcitonin <0.10 ng/mL    Comment:        Interpretation: PCT (Procalcitonin) <= 0.5 ng/mL: Systemic infection (sepsis) is not  likely. Local bacterial infection is possible. (NOTE)       Sepsis PCT Algorithm           Lower Respiratory Tract                                      Infection PCT Algorithm    ----------------------------     ----------------------------         PCT < 0.25 ng/mL                PCT < 0.10 ng/mL          Strongly encourage             Strongly discourage   discontinuation of antibiotics    initiation of antibiotics    ----------------------------     -----------------------------       PCT 0.25 - 0.50 ng/mL  PCT 0.10 - 0.25 ng/mL               OR       >80% decrease in PCT            Discourage initiation of                                            antibiotics      Encourage discontinuation           of antibiotics    ----------------------------     -----------------------------         PCT >= 0.50 ng/mL              PCT 0.26 - 0.50 ng/mL               AND        <80% decrease in PCT             Encourage initiation of                                             antibiotics       Encourage continuation           of antibiotics    ----------------------------     -----------------------------        PCT >= 0.50 ng/mL                  PCT > 0.50 ng/mL               AND         increase in PCT                  Strongly encourage                                      initiation of antibiotics    Strongly encourage escalation           of antibiotics                                     -----------------------------                                           PCT <= 0.25 ng/mL                                                 OR                                        > 80% decrease in PCT  Discontinue / Do not initiate                                             antibiotics  Performed at [facility], [address], [place], [place] [address]   C-reactive protein     Status: Abnormal   Collection Time: 09/11/24  2:58 AM  Result  Value Ref Range   CRP 7.2 (H) <1.0 mg/dL    Comment: Performed at [facility], [address], [place], [place] [address]  Osmolality     Status: Abnormal   Collection Time: 09/11/24  2:58 AM  Result Value Ref Range   Osmolality 257 (L) 275 - 295 mOsm/kg    Comment: Performed at [facility], [address], [place], [place] [address]  Na and K (sodium & potassium), rand urine     Status: None   Collection Time: 09/11/24 11:04 AM  Result Value Ref Range   Sodium, Ur 64 mmol/L   Potassium Urine 33 mmol/L    Comment: Performed at [facility], [address] [place], [place] [address]  Creatinine, urine, random     Status: None   Collection Time: 09/11/24 11:04 AM  Result Value Ref Range   Creatinine, Urine 120 mg/dL    Comment: Performed at [facility], [address] [place], [place] [address]  Varicella zoster antibody, IgG     Status: None   Collection Time: 09/11/24 11:50 AM  Result Value Ref Range   Varicella IgG Reactive Non Reactive    Comment: (NOTE) **Please note reference interval change** A Reactive result is considered evidence of immunity to VZV. Reactive indicates that VZV IgG was detected consistent with previous infection and/or vaccination. A Non Reactive result indicates that VZV IgG was not detected suggesting that immunity has not been acquired. Performed At: [facility] [address] [place], [place] [id number] Jennette Shorter MD Ey:[id number]   Sodium     Status: Abnormal   Collection Time: 09/11/24  6:29 PM  Result Value Ref Range   Sodium 126 (L) 135 - 145 mmol/L    Comment: Performed at [facility], [address] [place], [place] [address]  CBC with Differential/Platelet     Status: Abnormal   Collection Time: 09/12/24  1:58 AM  Result Value Ref Range   WBC 11.2 (H) 4.0 - 10.5 K/uL   RBC 5.15 4.22 - 5.81 MIL/uL   Hemoglobin 15.5 13.0 - 17.0 g/dL   HCT 56.3 60.9 - 47.9 %   MCV 84.7 80.0 - 100.0 fL    MCH 30.1 26.0 - 34.0 pg   MCHC 35.6 30.0 - 36.0 g/dL   RDW 83.9 (H) 88.4 - 84.4 %   Platelets 234 150 - 400 K/uL   nRBC 0.0 0.0 - 0.2 %   Neutrophils Relative % 86 %   Neutro Abs 9.6 (H) 1.7 - 7.7 K/uL   Lymphocytes Relative 6 %   Lymphs Abs 0.6 (L) 0.7 - 4.0 K/uL   Monocytes Relative 8 %   Monocytes Absolute 0.9 0.1 - 1.0 K/uL   Eosinophils Relative 0 %   Eosinophils Absolute 0.1 0.0 - 0.5 K/uL   Basophils Relative 0 %   Basophils Absolute 0.0 0.0 - 0.1 K/uL   Immature Granulocytes 0 %   Abs Immature Granulocytes 0.05  0.00 - 0.07 K/uL    Comment: Performed at [facility], [address] [place], [place] [address]  Comprehensive metabolic panel with GFR     Status: Abnormal   Collection Time: 09/12/24  1:58 AM  Result Value Ref Range   Sodium 125 (L) 135 - 145 mmol/L   Potassium 3.0 (L) 3.5 - 5.1 mmol/L   Chloride 91 (L) 98 - 111 mmol/L   CO2 20 (L) 22 - 32 mmol/L   Glucose, Bld 145 (H) 70 - 99 mg/dL    Comment: Glucose reference range applies only to samples taken after fasting for at least 8 hours.   BUN 12 8 - 23 mg/dL   Creatinine, Ser 9.23 0.61 - 1.24 mg/dL   Calcium 8.0 (L) 8.9 - 10.3 mg/dL   Total Protein 5.6 (L) 6.5 - 8.1 g/dL   Albumin 3.0 (L) 3.5 - 5.0 g/dL   AST 21 15 - 41 U/L   ALT 14 0 - 44 U/L   Alkaline Phosphatase 66 38 - 126 U/L   Total Bilirubin 1.4 (H) 0.0 - 1.2 mg/dL   GFR, Estimated >39 >39 mL/min    Comment: (NOTE) Calculated using the CKD-EPI Creatinine Equation (2021)    Anion gap 14 5 - 15    Comment: Performed at [facility], [address] [place], [place] [address]  Magnesium     Status: None   Collection Time: 09/12/24  1:58 AM  Result Value Ref Range   Magnesium 1.7 1.7 - 2.4 mg/dL    Comment: Performed at [facility], [address] [place], [place] [address]  Procalcitonin     Status: None   Collection Time: 09/12/24  1:58 AM  Result Value Ref Range   Procalcitonin <0.10 ng/mL    Comment:         Interpretation: PCT (Procalcitonin) <= 0.5 ng/mL: Systemic infection (sepsis) is not likely. Local bacterial infection is possible. (NOTE)       Sepsis PCT Algorithm           Lower Respiratory Tract                                      Infection PCT Algorithm    ----------------------------     ----------------------------         PCT < 0.25 ng/mL                PCT < 0.10 ng/mL          Strongly encourage             Strongly discourage   discontinuation of antibiotics    initiation of antibiotics    ----------------------------     -----------------------------       PCT 0.25 - 0.50 ng/mL            PCT 0.10 - 0.25 ng/mL               OR       >80% decrease in PCT            Discourage initiation of                                            antibiotics  Encourage discontinuation           of antibiotics    ----------------------------     -----------------------------         PCT >= 0.50 ng/mL              PCT 0.26 - 0.50 ng/mL               AND        <80% decrease in PCT             Encourage initiation of                                             antibiotics       Encourage continuation           of antibiotics    ----------------------------     -----------------------------        PCT >= 0.50 ng/mL                  PCT > 0.50 ng/mL               AND         increase in PCT                  Strongly encourage                                      initiation of antibiotics    Strongly encourage escalation           of antibiotics                                     -----------------------------                                           PCT <= 0.25 ng/mL                                                 OR                                        > 80% decrease in PCT                                      Discontinue / Do not initiate                                             antibiotics  Performed at [facility], [address] [place], [place] [address]    C-reactive protein     Status: Abnormal   Collection Time: 09/12/24  1:58 AM  Result Value Ref Range   CRP 3.8 (H) <1.0 mg/dL    Comment: Performed at [facility], [address], [place], [place] [address]  Cortisol-am, blood     Status: Abnormal   Collection Time: 09/12/24  1:58 AM  Result Value Ref Range   Cortisol - AM 30.7 (H) 6.7 - 22.6 ug/dL    Comment: Performed at [facility], [address] [place], [place] [address]   ECHOCARDIOGRAM COMPLETE Result Date: 09/10/2024    ECHOCARDIOGRAM REPORT   Patient Name:   Noah Burnett Date of Exam: 09/10/2024 Medical Rec #:  [id number]        Height:       67.0 in Accession #:    [id number]       Weight:       100.5 lb Date of Birth:  02/08/1941        BSA:          1.510 m Patient Age:    83 years         BP:           112/57 mmHg Patient Gender: M                HR:           59 bpm. Exam Location:  Inpatient Procedure: 2D Echo, 3D Echo, Cardiac Doppler, Color Doppler and Strain Analysis            (Both Spectral and Color Flow Doppler were utilized during            procedure). Indications:    Stroke  History:        Patient has no prior history of Echocardiogram examinations.                 Risk Factors:Hypertension.  Sonographer:    Philomena Daring Referring Phys: [id number] ASHISH ARORA  Sonographer Comments: Global longitudinal strain was attempted. IMPRESSIONS  1. Left ventricular ejection fraction, by estimation, is 55%. Left ventricular ejection fraction by 3D volume is 52 %. The left ventricle has normal function. The left ventricle has no regional wall motion abnormalities. Left ventricular diastolic parameters are consistent with Grade I diastolic dysfunction (impaired relaxation). The average left ventricular global longitudinal strain is -13.3 %.  2. Right ventricular systolic function is normal. The right ventricular size is normal. Tricuspid regurgitation signal is inadequate for assessing PA pressure.  3. The mitral valve is  normal in structure. Trivial mitral valve regurgitation. No evidence of mitral stenosis.  4. The aortic valve is tricuspid. Aortic valve regurgitation is mild. No aortic stenosis is present.  5. The inferior vena cava is normal in size with greater than 50% respiratory variability, suggesting right atrial pressure of 3 mmHg. Comparison(s): No prior Echocardiogram. FINDINGS  Left Ventricle: Left ventricular ejection fraction, by estimation, is 55%. Left ventricular ejection fraction by 3D volume is 52 %. The left ventricle has normal function. The left ventricle has no regional wall motion abnormalities. The average left ventricular global longitudinal strain is -13.3 %. Strain was performed and the global longitudinal strain is indeterminate. The left ventricular internal cavity size was normal in size. There is no left ventricular hypertrophy. Left ventricular diastolic parameters are consistent with Grade I diastolic dysfunction (impaired relaxation). Normal left ventricular filling pressure. Right Ventricle: The right ventricular size is normal. No increase in right ventricular wall thickness. Right ventricular systolic function is normal. Tricuspid regurgitation signal is inadequate for assessing  PA pressure. Left [facility]: Left atrial size was normal in size. Right [facility]: Right atrial size was normal in size. Pericardium: There is no evidence of pericardial effusion. Mitral Valve: The mitral valve is normal in structure. Trivial mitral valve regurgitation. No evidence of mitral valve stenosis. Tricuspid Valve: The tricuspid valve is normal in structure. Tricuspid valve regurgitation is not demonstrated. No evidence of tricuspid stenosis. Aortic Valve: The aortic valve is tricuspid. Aortic valve regurgitation is mild. No aortic stenosis is present. Pulmonic Valve: The pulmonic valve was not well visualized. Pulmonic valve regurgitation is not visualized. No evidence of pulmonic stenosis. Aorta: The aortic root  and ascending aorta are structurally normal, with no evidence of dilitation. Venous: The inferior vena cava is normal in size with greater than 50% respiratory variability, suggesting right atrial pressure of 3 mmHg. IAS/Shunts: No atrial level shunt detected by color flow Doppler. Additional Comments: 3D was performed not requiring image post processing on an independent workstation and was abnormal.  LEFT VENTRICLE PLAX 2D LVIDd:         4.00 cm         Diastology LVIDs:         2.80 cm         LV e' medial:  4.57 cm/s LV PW:         1.10 cm         LV e' lateral: 7.94 cm/s LV IVS:        1.10 cm LVOT diam:     2.00 cm         2D Longitudinal LV SV:         59              Strain LV SV Index:   39              2D Strain GLS   -13.3 % LVOT Area:     3.14 cm        Avg:                                 3D Volume EF                                LV 3D EF:    Left                                             ventricul                                             ar                                             ejection                                             fraction  by 3D                                             volume is                                             52 %.                                 3D Volume EF:                                3D EF:        52 %                                LV EDV:       84 ml                                LV ESV:       40 ml                                LV SV:        43 ml RIGHT VENTRICLE             IVC RV Basal diam:  2.80 cm     IVC diam: 1.20 cm RV Mid diam:    2.50 cm RV S prime:     12.50 cm/s TAPSE (M-mode): 1.6 cm LEFT ATRIUM             Index        RIGHT ATRIUM          Index LA diam:        3.40 cm 2.25 cm/m   RA Area:     9.48 cm LA Vol (A2C):   35.1 ml 23.25 ml/m  RA Volume:   14.90 ml 9.87 ml/m LA Vol (A4C):   37.9 ml 25.11 ml/m LA Biplane Vol: 38.1 ml 25.24 ml/m  AORTIC VALVE LVOT Vmax:   95.40 cm/s LVOT  Vmean:  61.300 cm/s LVOT VTI:    0.188 m  AORTA Ao Root diam: 3.20 cm Ao Asc diam:  3.00 cm MITRAL VALVE MV Area (PHT): 2.48 cm    SHUNTS MV Decel Time: 306 msec    Systemic VTI:  0.19 m MV A velocity: 84.40 cm/s  Systemic Diam: 2.00 cm Vishnu Priya Mallipeddi Electronically signed by Diannah Late Mallipeddi Signature Date/Time: 09/10/2024/4:39:28 PM    Final    DG FL GUIDED LUMBAR PUNCTURE Result Date: 09/10/2024 CLINICAL DATA:  83 year old male with a history of progressively worsening confusion, lethargy, and decreasing oral intake. Noticed to have worsening generalized declined following shingles outbreak. Request for lumbar puncture to rule out HSV encephalitis. EXAM: LUMBAR PUNCTURE UNDER FLUOROSCOPY PROCEDURE: An appropriate skin entry site was determined fluoroscopically. Operator donned sterile gloves and mask. Skin site was marked, then prepped with Betadine, draped in usual sterile  fashion, and infiltrated locally with 1% lidocaine . A 20 gauge spinal needle advanced into the thecal sac at L5-S1 from a right interlaminar approach. Clear colorless CSF spontaneously returned, with opening pressure of 13 cm water. 15 ml CSF were collected and divided among 4 sterile vials for the requested laboratory studies. The needle was then removed. The patient tolerated the procedure well and there were no complications. FLUOROSCOPY: Radiation Exposure Index (as provided by the fluoroscopic device): 2.7 mGy Kerma IMPRESSION: Technically successful lumbar puncture under fluoroscopy. This exam was performed by [facility] PA-C, and was supervised and interpreted by Dr. Ree Molt. Electronically Signed   By: Ree Molt M.D.   On: 09/10/2024 14:00    PMH:   Past Medical History:  Diagnosis Date   Arthritis    Bladder tumor    Deafness, left    Full dentures    GERD (gastroesophageal reflux disease)    History of [place] spotted fever    01-25-2020  per pt had in 2018 and was treated w/ no  residual   Hyperplasia of prostate with lower urinary tract symptoms (LUTS)    Hypertension    followed by pcp   (01-25-2020  per pt had stress test approx. 2005, told normal , done in [place] somewhere)   Pre-diabetes    followed by pcp ,   watches diet   Prostate cancer [facility]) urologist--- dr watt   dx by bx01-13-2020,  Gleason 3+4 ;  MRI 12-19-2018, vol 96.26;   active survillance   Shingles    Wears glasses    Wears hearing aid in right ear     PSH:   Past Surgical History:  Procedure Laterality Date   SHOULDER ARTHROSCOPY WITH ROTATOR CUFF REPAIR AND SUBACROMIAL DECOMPRESSION Right 05-20-2009  @[facility]    TRANSURETHRAL RESECTION OF BLADDER TUMOR WITH MITOMYCIN -C N/A 02/01/2020   Procedure: TRANSURETHRAL RESECTION OF BLADDER TUMOR;  Surgeon: Watt Rush, MD;  Location: [facility];  Service: Urology;  Laterality: N/A;   TRANSURETHRAL RESECTION OF PROSTATE      Allergies:  Allergies  Allergen Reactions   Haemophilus Influenzae Vaccines Other (See Comments)    I get deathly sick    Medications:   Prior to Admission medications   Medication Sig Start Date End Date Taking? Authorizing Provider  acetaminophen  (TYLENOL ) 500 MG tablet Take 1,000 mg by mouth every 6 (six) hours as needed for headache or mild pain (pain score 1-3).   Yes [provider]  docusate sodium (COLACE) 100 MG capsule Take 100 mg by mouth 2 (two) times daily.   Yes [provider]  hydrochlorothiazide (HYDRODIURIL) 12.5 MG tablet Take 12.5 mg by mouth daily. 02/16/23  Yes [provider]  megestrol (MEGACE) 40 MG/ML suspension Take 400 mg by mouth every morning. 09/04/24  Yes [provider]  omeprazole (PRILOSEC) 40 MG capsule Take 40 mg by mouth daily. 02/16/23  Yes [provider]  polyethylene glycol (MIRALAX / GLYCOLAX) 17 g packet Take 17 g by mouth daily.   Yes [provider]    Discontinued Meds:   Medications Discontinued During  This Encounter  Medication Reason   bisacodyl (DULCOLAX) 5 MG EC tablet Change in therapy   magnesium hydroxide (MILK OF MAGNESIA) 400 MG/5ML suspension Patient Preference   mirtazapine (REMERON SOL-TAB) disintegrating tablet 15 mg    enoxaparin (LOVENOX) injection 40 mg    lactated ringers  infusion    TPN ADULT (ION)    insulin aspart (novoLOG)  injection 0-9 Units    feeding supplement (ENSURE PLUS HIGH PROTEIN) liquid 237 mL    lactated ringers  infusion    valACYclovir (VALTREX) 1000 MG tablet Completed Course   lactated ringers  infusion    0.9 %  sodium chloride  infusion     Social History:  reports that he quit smoking about 25 years ago. His smoking use included cigarettes. He started smoking about 61 years ago. He has a 36 pack-year smoking history. He has been exposed to tobacco smoke. He has never used smokeless tobacco. He reports that he does not currently use alcohol. He reports that he does not use drugs.  Family History:  History reviewed. No pertinent family history.  Blood pressure (!) 151/72, pulse 68, temperature 97.6 F (36.4 C), temperature source Oral, resp. rate 15, height 5' 7 (1.702 m), weight 45.6 kg, SpO2 100%. Physical Exam: General appearance: alert and cooperative Head: NCAT Neck: no adenopathy Back: symmetric Resp: CTA b/l Cardio: regular rate and rhythm GI: soft, non-tender; bowel sounds normal Extremities: edema none Pulses: 2+ and symmetric     Yesha Muchow, LYNWOOD ORN, MD 09/12/2024, 10:50 AM

## 2024-09-12 NOTE — Plan of Care (Signed)
   Problem: Activity: Goal: Risk for activity intolerance will decrease Outcome: Progressing   Problem: Nutrition: Goal: Adequate nutrition will be maintained Outcome: Progressing   Problem: Safety: Goal: Ability to remain free from injury will improve Outcome: Progressing   Problem: Skin Integrity: Goal: Risk for impaired skin integrity will decrease Outcome: Progressing

## 2024-09-12 NOTE — Progress Notes (Signed)
Inpatient Rehab Admissions Coordinator:    CIR following. Case pending with insurance.   Megan Salon, MS, CCC-SLP Rehab Admissions Coordinator  [phone] (celll) [phone] (office)

## 2024-09-12 NOTE — Progress Notes (Signed)
 Occupational Therapy Treatment Patient Details Name: Noah Burnett MRN: [id number] DOB: 15-Mar-1941 Today's Date: 09/12/2024   History of present illness 83 y.o. male presents to [facility] 09/08/24 after unwitnessed fall and AMS. Admitted with failure to thrive after multiple GI procedures. MRI brain concerning for multiple lesions with differentials that include encephalitis versus infectious etiology versus embolic infarcts. EEG showed encephalopathy. PMHx: HTN, prediabetes, GERD, gastric outlet obstruction w/ history of bypass gastrojejunostomy   OT comments  Pt progressing toward established OT goals. Following one step commands with up to mod multimodal cues this session, and needing up to mod multimodal cues for initiation of tasks as well with overall flat affect throughout. Pt with good progression with mobility needing mod A for bed mobility, min A for transfers, and min-mod A +2 for functional ambulation within room secondary to poor balance, scissoring of BLE with LLE crossing over RLE with fatigue. Pt additionally observed with R inattention. Could benefit from further visual assessment. Pt with support from niece in room. Recommending intensive inpatient rehabilitation >3 hours secondary to drastic functional and cognitive decline.       If plan is discharge home, recommend the following:  Two people to help with walking and/or transfers;A lot of help with bathing/dressing/bathroom;Assistance with cooking/housework;Direct supervision/assist for medications management;Direct supervision/assist for financial management;Assist for transportation;Help with stairs or ramp for entrance;Supervision due to cognitive status   Equipment Recommendations  Other (comment) (defer)    Recommendations for Other Services      Precautions / Restrictions Precautions Precautions: Fall Recall of Precautions/Restrictions: Impaired Restrictions Weight Bearing Restrictions Per Provider Order: No        Mobility Bed Mobility Overal bed mobility: Needs Assistance Bed Mobility: Supine to Sit     Supine to sit: Mod assist     General bed mobility comments: fair initiation with cues to bring BLE toward EOB and elevate trunk, however, then needed significant assist to scoot hips toward EOB    Transfers Overall transfer level: Needs assistance Equipment used: 2 person hand held assist Transfers: Sit to/from Stand Sit to Stand: Min assist, +2 safety/equipment           General transfer comment: min A for multimodal cues to initiate anterior weight shift and rise. light steadying assist with HHA     Balance Overall balance assessment: Needs assistance Sitting-balance support: Feet supported Sitting balance-Leahy Scale: Poor Sitting balance - Comments: fair initially but overall poor with onset fatigue   Standing balance support: Bilateral upper extremity supported, During functional activity Standing balance-Leahy Scale: Poor                             ADL either performed or assessed with clinical judgement   ADL Overall ADL's : Needs assistance/impaired     Grooming: Wash/dry face;Moderate assistance;Maximal assistance;Standing Grooming Details (indicate cue type and reason): min A for standing balance and mod-max A due to need for heavy multimodal cueing and set up of task to initiate. pt able to wash face with max set up with only min A for standing balance.                 Toilet Transfer: Minimal assistance;Moderate assistance;+2 for safety/equipment;+2 for physical assistance;Ambulation Toilet Transfer Details (indicate cue type and reason): 2 person HHA         Functional mobility during ADLs: Minimal assistance;Moderate assistance      Extremity/Trunk Assessment Upper Extremity Assessment Upper Extremity Assessment:  Generalized weakness   Lower Extremity Assessment Lower Extremity Assessment: Defer to PT evaluation        Vision    Additional Comments: to be further assessed. Pt with preference for L head turn as well as appears to have decr attention to R side of environment during mobility needing multimodal cues for environmental navigation toward R esp with fatigue. may need visual fields assessment   Perception Perception Perception: Not tested   Praxis Praxis Praxis: Impaired Praxis Impairment Details: Initiation   Communication Communication Communication: Impaired Factors Affecting Communication: Reduced clarity of speech;Difficulty expressing self (pt with improved pronunciation today, however, continues with garbled speech overall)   Cognition Arousal: Alert Behavior During Therapy: Flat affect Cognition: Cognition impaired   Orientation impairments: Situation, Time Awareness: Online awareness impaired Memory impairment (select all impairments): Short-term memory Attention impairment (select first level of impairment): Sustained attention Executive functioning impairment (select all impairments): Initiation, Sequencing, Problem solving, Organization OT - Cognition Comments: increased time and multimodal cues to initiate all tasks                 Following commands: Impaired Following commands impaired: Follows one step commands inconsistently      Cueing   Cueing Techniques: Verbal cues  Exercises      Shoulder Instructions       General Comments      Pertinent Vitals/ Pain       Pain Assessment Pain Assessment: Faces  Home Living                                          Prior Functioning/Environment              Frequency  Min 2X/week        Progress Toward Goals  OT Goals(current goals can now be found in the care plan section)  Progress towards OT goals: Progressing toward goals  Acute Rehab OT Goals Time For Goal Achievement: 09/24/24 Potential to Achieve Goals: Good ADL Goals Pt Will Perform Upper Body Dressing: sitting;with contact  guard assist Pt Will Perform Lower Body Dressing: with contact guard assist;sitting/lateral leans;sit to/from stand Pt Will Transfer to Toilet: with contact guard assist;ambulating;regular height toilet Additional ADL Goal #1: pt will follow single step command with 75% accuracy in prep for ADLs  Plan      Co-evaluation                 AM-PAC OT 6 Clicks Daily Activity     Outcome Measure   Help from another person eating meals?: A Lot Help from another person taking care of personal grooming?: A Lot Help from another person toileting, which includes using toliet, bedpan, or urinal?: A Lot Help from another person bathing (including washing, rinsing, drying)?: A Lot Help from another person to put on and taking off regular upper body clothing?: A Lot Help from another person to put on and taking off regular lower body clothing?: A Lot 6 Click Score: 12    End of Session Equipment Utilized During Treatment: Gait belt  OT Visit Diagnosis: Unsteadiness on feet (R26.81);Other abnormalities of gait and mobility (R26.89);Muscle weakness (generalized) (M62.81)   Activity Tolerance Patient tolerated treatment well   Patient Left in chair;with call bell/phone within reach;with chair alarm set   Nurse Communication Mobility status        Time: 9063-8994 OT Time Calculation (min):  29 min  Charges: OT General Charges $OT Visit: 1 Visit OT Treatments $Self Care/Home Management : 8-22 mins  Elma JONETTA Lebron FREDERICK, OTR/L [facility] Acute Rehabilitation Office: [phone]   Elma JONETTA Lebron 09/12/2024, 10:47 AM

## 2024-09-12 NOTE — Progress Notes (Signed)
    [facility]    Date of Admission:  09/08/2024   Total days of antibiotics 4   ID: Noah Burnett is a 83 y.o. male with   Principal Problem:   Failure to thrive in adult Active Problems:   Essential hypertension   Gastric outlet obstruction   H/O bypass gastrojejunostomy   Hyponatremia   Prediabetes   Protein-calorie malnutrition, severe    Subjective: Afebrile. Had good appetite yesterday. Today less so, per his daughter. Remains alert. Answers questions appropriately.   Day 4 - of acyclovir - remains to have stable cr on labs today  Medications:   aspirin EC  81 mg Oral Daily   enoxaparin (LOVENOX) injection  30 mg Subcutaneous Q24H   feeding supplement  1 Container Oral TID BM   multivitamin with minerals  1 tablet Oral Daily   pantoprazole   40 mg Oral BID   polyethylene glycol  17 g Oral Daily   potassium chloride  40 mEq Oral BID   rosuvastatin  10 mg Oral Daily   senna  2 tablet Oral Daily   sodium chloride   1 g Oral TID WC   vitamin B-12  500 mcg Oral Daily    Objective: Vital signs in last 24 hours: Temp:  [97.5 F (36.4 C)-97.8 F (36.6 C)] 97.5 F (36.4 C) (11/04 1119) Pulse Rate:  [51-68] 58 (11/04 1119) Resp:  [15-18] 17 (11/04 1119) BP: (108-151)/(62-81) 111/62 (11/04 1119) SpO2:  [100 %] 100 % (11/04 1119)  Physical Exam  Constitutional: He is oriented to person, place, and time. He appears older than stated age and under-nourished. No distress.  HENT:  Mouth/Throat: Oropharynx is clear and moist. No oropharyngeal exudate.  Cardiovascular: Normal rate, regular rhythm and normal heart sounds. Exam reveals no gallop and no friction rub.  No murmur heard.  Pulmonary/Chest: Effort normal and breath sounds normal. No respiratory distress. He has no wheezes.  Abdominal: Soft. Bowel sounds are normal. He exhibits no distension. There is no tenderness.  Lymphadenopathy:  He has no cervical adenopathy.  Neurological: He  is alert and oriented to person, place, and time.  Skin: Skin is warm and dry. No rash noted. No erythema.  Psychiatric: He has a normal mood and affect. His behavior is normal.    Lab Results Recent Labs    09/11/24 0258 09/11/24 1829 09/12/24 0158  WBC 6.1  --  11.2*  HGB 13.4  --  15.5  HCT 37.5*  --  43.6  NA 125* 126* 125*  K 3.6  --  3.0*  CL 93*  --  91*  CO2 21*  --  20*  BUN 12  --  12  CREATININE 0.67  --  0.76   Liver Panel Recent Labs    09/11/24 0258 09/12/24 0158  PROT 4.7* 5.6*  ALBUMIN 2.6* 3.0*  AST 12* 21  ALT 9 14  ALKPHOS 58 66  BILITOT 1.6* 1.4*    C-Reactive Protein Recent Labs    09/11/24 0258 09/12/24 0158  CRP 7.2* 3.8*    Microbiology: reviewed Studies/Results: No results found.   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  Failure to thrive = continue to advocate for increase caloric intake  Deconditioning = being evaluated for CIR  Will sign off  [facility] for Infectious Diseases Pager: [phone]  09/12/2024, 2:17 PM

## 2024-09-12 NOTE — Progress Notes (Signed)
 [facility]                                                                               Sebastien Jackson, is a 83 y.o. 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  Patient had hospital admission at [facility] from 8/18 to 07/09/24 when he presented with nausea and vomiting.  He was found to have gastric outlet obstruction and duodenal mass. He had an EGD that showed an hiatal hernia and compression of the second portion of the duodenum with a possible intraluminal neoplasm. Biopsies were obtained which showed severely inflamed and ulcerated duodenal mucosa, but no evidence of malignancy. A second EGD was done with ultrasound guidance, but again pathology of the lesion extensive necrosis. Surgical oncology was consulted and he underwent a robotic gastrojejunostomy on 8/25. He did have a delayed return of bowel function and had a PICC line with TPN for several days until he had advancement of his diet. Since his hospitalization, he has had a fairly steady functional decline with decreasing appetite, significant weight loss, and generalized weakness.  After discharge, the patient was tolerating a diet and still driving and walking independently.  Unfortunately, the patient developed shingles in the last week of September 2025.  He went to see ophthalmology, and he was cleared from their standpoint.  He finished a course of Valtrex.  He continues to have some postherpetic neuralgia.  Daughter states that since his shingles episode, the patient has had a functional decline with decreasing appetite,  significant weight loss, and generalized weakness.  The patient has followed up with surgical oncology on 08/31/2024.  At that time the patient had reported generalized weakness and fatigue, but he did not have any vomiting or abdominal pain at that time.  He had poor p.o. intake and loss of appetite at that time.  Repeat MRI of the abdomen and pelvis was obtained.  This was performed on 09/06/2024 which showed no specific MR findings to suggest a duodenal mass.  There was Generalized edema-like signal in the retroperitoneum is presumed reactive in the setting of known duodenal ulceration.  There were multiple benign appearing hepatic lesions presumed to be cysts or hemangioma.  There was Bosniak 2 renal cysts.     The patient has not complained of any specific issues.  There has been no fevers, chills, headache, chest pain, shortness breath, nausea, vomiting, diarrhea, abdominal pain.  In fact he has been struggling with constipation. Daughter states that in the past week he has had increasing confusion and generalized weakness with continued decreased oral intake.  UA was negative for pyuria.  CT of the brain was negative for acute findings.  Acute abdominal series was negative for obstruction or pneumoperitoneum.  Neurology consulted for evaluation of acute encephalopathy. Family at bedside reports all his  symptoms, generalized weakness, confusion,  dysarthria, lack of appetite started after he was diagnosed with shingles.  His hospital course complicated by acute on chronic hyponatremia. Nephrology consulted and he was started on salt tablets.   Therapy evaluations recommending CIR.     Assessment & Plan    Assessment and Plan:   Failure to thrive/acute metabolic encephalopathy MRI of the brain shows multiple areas of subcortical enhancement in the parieto-occipital lobe.  Differential include encephalitis versus vasculitis versus demyelinating disease vs stroke.  Neurology consulted and  patient was transferred from [facility] to [facility]. Patient underwent fluoroscopic guided lumbar puncture and fluid sent for analysis for evaluation of meningitis and encephalitis. Procalcitonin is negative, CRP is 4.1.  Patient is alert, oriented to self only, still has dysarthria and confusion.  CRP IS 7.2 improved to 3.8. procalcitonin is negative.  No signs of infection.  Echocardiogram shows preserved LVEF, with grade 1 diastolic dysfunction.  EEG done, showed diffuse cerebral dysfunction, no epileptiform discharges seen throughout the recording.  VZVPCR is pending.     Hyponatremia History of chronic hyponatremia. Possibly from SIADH.  Sodium in August, 2025 was 129.  Patient was admitted with a sodium of 126 on 10/21. TSH slightly elevated. Am cortisol 30 ? Repeat  Serum osmo is 257, urine sodium is 137, urine osmolality is 490.  Nephrology consulted for hyponatremia.  Salt tablets ordered.      GERD/ Duodenal ulceration, stricture and mass s/p  robotic gastrojejunostomy on 8/25. Continue with PPI    Hypertension Blood pressure parameters are well controlled.    Hypokalemia Hypomagnesemia Replaced, repeat in am.     Mildly elevated bilirubin with normal alk phos and liver enzymes.  Improving, Monitor.    Recently diagnosed Shingles On valacyclovir, continue the same.    RN Pressure Injury Documentation:    Malnutrition Type:  Nutrition Problem: Severe Malnutrition Etiology: chronic illness   Malnutrition Characteristics:  Signs/Symptoms: energy intake < or equal to 50% for > or equal to 1 month, severe fat depletion, severe muscle depletion, percent weight loss Percent weight loss: 18 %   Nutrition Interventions:  Interventions: Boost Breeze, Magic cup, MVI  Estimated body mass index is 15.75 kg/m as calculated from the following:   Height as of this encounter: 5' 7 (1.702 m).   Weight as of this encounter: 45.6 kg.  Code Status: full  code.  DVT Prophylaxis:  enoxaparin (LOVENOX) injection 30 mg Start: 09/09/24 2200   Level of Care: Level of care: Telemetry Family Communication: family at bedside.   Disposition Plan:     Remains inpatient appropriate:  hyponatremia , CIR when stable.   Procedures:  LP  Consultants:   Neurology Nephrology   Antimicrobials:   Anti-infectives (From admission, onward)    Start     Dose/Rate Route Frequency Ordered Stop   09/09/24 1500  acyclovir (ZOVIRAX) 455 mg in dextrose  5 % 100 mL IVPB        10 mg/kg  45.6 kg 109.1 mL/hr over 60 Minutes Intravenous Every 12 hours 09/09/24 1331          Medications  Scheduled Meds:  aspirin EC  81 mg Oral Daily   enoxaparin (LOVENOX) injection  30 mg Subcutaneous Q24H   feeding supplement  1 Container Oral TID BM   multivitamin with minerals  1 tablet Oral Daily   pantoprazole   40 mg Oral Daily   polyethylene glycol  17 g Oral Daily   potassium chloride  40 mEq  Oral BID   rosuvastatin  10 mg Oral Daily   senna  2 tablet Oral Daily   sodium chloride   1 g Oral TID WC   vitamin B-12  500 mcg Oral Daily   Continuous Infusions:  acyclovir (ZOVIRAX) 455 mg in dextrose  5 % 100 mL IVPB 455 mg (09/12/24 0930)   PRN Meds:.acetaminophen , loperamide, ondansetron  **OR** ondansetron  (ZOFRAN ) IV    Subjective:   Parrish Bonn was seen and examined today.  No new complaints.   Objective:   Vitals:   09/12/24 0000 09/12/24 0401 09/12/24 0724 09/12/24 1119  BP: 108/63 114/63 (!) 151/72 111/62  Pulse:   68 (!) 58  Resp: 18 18 15 17   Temp: 97.6 F (36.4 C) 97.8 F (36.6 C) 97.6 F (36.4 C) (!) 97.5 F (36.4 C)  TempSrc: Oral Oral Oral Oral  SpO2:   100% 100%  Weight:      Height:        Intake/Output Summary (Last 24 hours) at 09/12/2024 1129 Last data filed at 09/12/2024 0444 Gross per 24 hour  Intake 1093.56 ml  Output 600 ml  Net 493.56 ml   Filed Weights   09/08/24 1138 09/08/24 1626  Weight: 51.2 kg 45.6 kg      Exam General exam: Appears calm and comfortable  Respiratory system: Clear to auscultation. Respiratory effort normal. Cardiovascular system: S1 & S2 heard, RRR. Gastrointestinal system: Abdomen is nondistended, soft and nontender. Central nervous system: Alert , oriented to self only, dysarthria, weakness on the right side.  Extremities: no pedal edema.  Skin: No rashes Psychiatry: pleasantly confused.       Data Reviewed:  I have personally reviewed following labs and imaging studies   CBC Lab Results  Component Value Date   WBC 11.2 (H) 09/12/2024   RBC 5.15 09/12/2024   HGB 15.5 09/12/2024   HCT 43.6 09/12/2024   MCV 84.7 09/12/2024   MCH 30.1 09/12/2024   PLT 234 09/12/2024   MCHC 35.6 09/12/2024   RDW 16.0 (H) 09/12/2024   LYMPHSABS 0.6 (L) 09/12/2024   MONOABS 0.9 09/12/2024   EOSABS 0.1 09/12/2024   BASOSABS 0.0 09/12/2024     Last metabolic panel Lab Results  Component Value Date   NA 125 (L) 09/12/2024   K 3.0 (L) 09/12/2024   CL 91 (L) 09/12/2024   CO2 20 (L) 09/12/2024   BUN 12 09/12/2024   CREATININE 0.76 09/12/2024   GLUCOSE 145 (H) 09/12/2024   GFRNONAA >60 09/12/2024   GFRAA >60 03/27/2018   CALCIUM 8.0 (L) 09/12/2024   PHOS 3.6 09/10/2024   PROT 5.6 (L) 09/12/2024   ALBUMIN 3.0 (L) 09/12/2024   BILITOT 1.4 (H) 09/12/2024   ALKPHOS 66 09/12/2024   AST 21 09/12/2024   ALT 14 09/12/2024   ANIONGAP 14 09/12/2024    CBG (last 3)  Recent Labs    09/09/24 1242  GLUCAP 141*      Coagulation Profile: Recent Labs  Lab 09/10/24 0255  INR 1.0     Radiology Studies: ECHOCARDIOGRAM COMPLETE Result Date: 09/10/2024    ECHOCARDIOGRAM REPORT   Patient Name:   RAYDAN SCHLABACH Date of Exam: 09/10/2024 Medical Rec #:  [id number]        Height:       67.0 in Accession #:    [id number]       Weight:       100.5 lb Date of Birth:  [birth year]        BSA:  1.510 m Patient Age:    83 years         BP:           112/57 mmHg Patient  Gender: M                HR:           59 bpm. Exam Location:  Inpatient Procedure: 2D Echo, 3D Echo, Cardiac Doppler, Color Doppler and Strain Analysis            (Both Spectral and Color Flow Doppler were utilized during            procedure). Indications:    Stroke  History:        Patient has no prior history of Echocardiogram examinations.                 Risk Factors:Hypertension.  Sonographer:    Philomena Daring Referring Phys: [id number] ASHISH ARORA  Sonographer Comments: Global longitudinal strain was attempted. IMPRESSIONS  1. Left ventricular ejection fraction, by estimation, is 55%. Left ventricular ejection fraction by 3D volume is 52 %. The left ventricle has normal function. The left ventricle has no regional wall motion abnormalities. Left ventricular diastolic parameters are consistent with Grade I diastolic dysfunction (impaired relaxation). The average left ventricular global longitudinal strain is -13.3 %.  2. Right ventricular systolic function is normal. The right ventricular size is normal. Tricuspid regurgitation signal is inadequate for assessing PA pressure.  3. The mitral valve is normal in structure. Trivial mitral valve regurgitation. No evidence of mitral stenosis.  4. The aortic valve is tricuspid. Aortic valve regurgitation is mild. No aortic stenosis is present.  5. The inferior vena cava is normal in size with greater than 50% respiratory variability, suggesting right atrial pressure of 3 mmHg. Comparison(s): No prior Echocardiogram. FINDINGS  Left Ventricle: Left ventricular ejection fraction, by estimation, is 55%. Left ventricular ejection fraction by 3D volume is 52 %. The left ventricle has normal function. The left ventricle has no regional wall motion abnormalities. The average left ventricular global longitudinal strain is -13.3 %. Strain was performed and the global longitudinal strain is indeterminate. The left ventricular internal cavity size was normal in size. There is no  left ventricular hypertrophy. Left ventricular diastolic parameters are consistent with Grade I diastolic dysfunction (impaired relaxation). Normal left ventricular filling pressure. Right Ventricle: The right ventricular size is normal. No increase in right ventricular wall thickness. Right ventricular systolic function is normal. Tricuspid regurgitation signal is inadequate for assessing PA pressure. Left Atrium: Left atrial size was normal in size. Right Atrium: Right atrial size was normal in size. Pericardium: There is no evidence of pericardial effusion. Mitral Valve: The mitral valve is normal in structure. Trivial mitral valve regurgitation. No evidence of mitral valve stenosis. Tricuspid Valve: The tricuspid valve is normal in structure. Tricuspid valve regurgitation is not demonstrated. No evidence of tricuspid stenosis. Aortic Valve: The aortic valve is tricuspid. Aortic valve regurgitation is mild. No aortic stenosis is present. Pulmonic Valve: The pulmonic valve was not well visualized. Pulmonic valve regurgitation is not visualized. No evidence of pulmonic stenosis. Aorta: The aortic root and ascending aorta are structurally normal, with no evidence of dilitation. Venous: The inferior vena cava is normal in size with greater than 50% respiratory variability, suggesting right atrial pressure of 3 mmHg. IAS/Shunts: No atrial level shunt detected by color flow Doppler. Additional Comments: 3D was performed not requiring image post processing on an independent workstation and  was abnormal.  LEFT VENTRICLE PLAX 2D LVIDd:         4.00 cm         Diastology LVIDs:         2.80 cm         LV e' medial:  4.57 cm/s LV PW:         1.10 cm         LV e' lateral: 7.94 cm/s LV IVS:        1.10 cm LVOT diam:     2.00 cm         2D Longitudinal LV SV:         59              Strain LV SV Index:   39              2D Strain GLS   -13.3 % LVOT Area:     3.14 cm        Avg:                                 3D Volume EF                                 LV 3D EF:    Left                                             ventricul                                             ar                                             ejection                                             fraction                                             by 3D                                             volume is                                             52 %.                                 3D Volume EF:  3D EF:        52 %                                LV EDV:       84 ml                                LV ESV:       40 ml                                LV SV:        43 ml RIGHT VENTRICLE             IVC RV Basal diam:  2.80 cm     IVC diam: 1.20 cm RV Mid diam:    2.50 cm RV S prime:     12.50 cm/s TAPSE (M-mode): 1.6 cm LEFT ATRIUM             Index        RIGHT ATRIUM          Index LA diam:        3.40 cm 2.25 cm/m   RA Area:     9.48 cm LA Vol (A2C):   35.1 ml 23.25 ml/m  RA Volume:   14.90 ml 9.87 ml/m LA Vol (A4C):   37.9 ml 25.11 ml/m LA Biplane Vol: 38.1 ml 25.24 ml/m  AORTIC VALVE LVOT Vmax:   95.40 cm/s LVOT Vmean:  61.300 cm/s LVOT VTI:    0.188 m  AORTA Ao Root diam: 3.20 cm Ao Asc diam:  3.00 cm MITRAL VALVE MV Area (PHT): 2.48 cm    SHUNTS MV Decel Time: 306 msec    Systemic VTI:  0.19 m MV A velocity: 84.40 cm/s  Systemic Diam: 2.00 cm Vishnu Priya Mallipeddi Electronically signed by Diannah Late Mallipeddi Signature Date/Time: 09/10/2024/4:39:28 PM    Final    DG FL GUIDED LUMBAR PUNCTURE Result Date: 09/10/2024 CLINICAL DATA:  83 year old male with a history of progressively worsening confusion, lethargy, and decreasing oral intake. Noticed to have worsening generalized declined following shingles outbreak. Request for lumbar puncture to rule out HSV encephalitis. EXAM: LUMBAR PUNCTURE UNDER FLUOROSCOPY PROCEDURE: An appropriate skin entry site was determined fluoroscopically. Operator donned sterile gloves and  mask. Skin site was marked, then prepped with Betadine, draped in usual sterile fashion, and infiltrated locally with 1% lidocaine . A 20 gauge spinal needle advanced into the thecal sac at L5-S1 from a right interlaminar approach. Clear colorless CSF spontaneously returned, with opening pressure of 13 cm water. 15 ml CSF were collected and divided among 4 sterile vials for the requested laboratory studies. The needle was then removed. The patient tolerated the procedure well and there were no complications. FLUOROSCOPY: Radiation Exposure Index (as provided by the fluoroscopic device): 2.7 mGy Kerma IMPRESSION: Technically successful lumbar puncture under fluoroscopy. This exam was performed by [facility] PA-C, and was supervised and interpreted by Dr. Ree Molt. Electronically Signed   By: Ree Molt M.D.   On: 09/10/2024 14:00       Elgie Butter M.D. [facility] 09/12/2024, 11:29 AM  Available via Epic secure chat 7am-7pm After 7 pm, please refer to night coverage provider listed on amion.

## 2024-09-13 ENCOUNTER — Inpatient Hospital Stay (HOSPITAL_COMMUNITY)

## 2024-09-13 ENCOUNTER — Encounter: Payer: Self-pay | Admitting: *Deleted

## 2024-09-13 DIAGNOSIS — R627 Adult failure to thrive: Secondary | ICD-10-CM | POA: Diagnosis not present

## 2024-09-13 DIAGNOSIS — Z7189 Other specified counseling: Secondary | ICD-10-CM | POA: Diagnosis not present

## 2024-09-13 DIAGNOSIS — I609 Nontraumatic subarachnoid hemorrhage, unspecified: Secondary | ICD-10-CM

## 2024-09-13 DIAGNOSIS — R4182 Altered mental status, unspecified: Secondary | ICD-10-CM | POA: Diagnosis not present

## 2024-09-13 DIAGNOSIS — R569 Unspecified convulsions: Secondary | ICD-10-CM | POA: Diagnosis not present

## 2024-09-13 DIAGNOSIS — Z515 Encounter for palliative care: Secondary | ICD-10-CM | POA: Diagnosis not present

## 2024-09-13 LAB — COMPREHENSIVE METABOLIC PANEL WITH GFR
ALT: 12 U/L (ref 0–44)
AST: 23 U/L (ref 15–41)
Albumin: 3 g/dL — ABNORMAL LOW (ref 3.5–5.0)
Alkaline Phosphatase: 63 U/L (ref 38–126)
Anion gap: 12 (ref 5–15)
BUN: 17 mg/dL (ref 8–23)
CO2: 23 mmol/L (ref 22–32)
Calcium: 8.1 mg/dL — ABNORMAL LOW (ref 8.9–10.3)
Chloride: 93 mmol/L — ABNORMAL LOW (ref 98–111)
Creatinine, Ser: 1.61 mg/dL — ABNORMAL HIGH (ref 0.61–1.24)
GFR, Estimated: 43 mL/min — ABNORMAL LOW (ref >60)
Glucose, Bld: 147 mg/dL — ABNORMAL HIGH (ref 70–99)
Potassium: 4.1 mmol/L (ref 3.5–5.1)
Sodium: 128 mmol/L — ABNORMAL LOW (ref 135–145)
Total Bilirubin: 1 mg/dL (ref 0.0–1.2)
Total Protein: 5.3 g/dL — ABNORMAL LOW (ref 6.5–8.1)

## 2024-09-13 LAB — CBC WITH DIFFERENTIAL/PLATELET
Abs Immature Granulocytes: 0.08 K/uL — ABNORMAL HIGH (ref 0.00–0.07)
Basophils Absolute: 0 K/uL (ref 0.0–0.1)
Basophils Relative: 0 %
Eosinophils Absolute: 0 K/uL (ref 0.0–0.5)
Eosinophils Relative: 0 %
HCT: 43.7 % (ref 39.0–52.0)
Hemoglobin: 15.4 g/dL (ref 13.0–17.0)
Immature Granulocytes: 1 %
Lymphocytes Relative: 3 %
Lymphs Abs: 0.5 K/uL — ABNORMAL LOW (ref 0.7–4.0)
MCH: 30.2 pg (ref 26.0–34.0)
MCHC: 35.2 g/dL (ref 30.0–36.0)
MCV: 85.7 fL (ref 80.0–100.0)
Monocytes Absolute: 1 K/uL (ref 0.1–1.0)
Monocytes Relative: 8 %
Neutro Abs: 12.1 K/uL — ABNORMAL HIGH (ref 1.7–7.7)
Neutrophils Relative %: 88 %
Platelets: 227 K/uL (ref 150–400)
RBC: 5.1 MIL/uL (ref 4.22–5.81)
RDW: 16.4 % — ABNORMAL HIGH (ref 11.5–15.5)
WBC: 13.7 K/uL — ABNORMAL HIGH (ref 4.0–10.5)
nRBC: 0 % (ref 0.0–0.2)

## 2024-09-13 LAB — PROCALCITONIN: Procalcitonin: 0.1 ng/mL

## 2024-09-13 LAB — MISC LABCORP TEST (SEND OUT): Labcorp test code: 505625

## 2024-09-13 LAB — CSF CULTURE W GRAM STAIN: Culture: NO GROWTH

## 2024-09-13 LAB — MAGNESIUM: Magnesium: 2.5 mg/dL — ABNORMAL HIGH (ref 1.7–2.4)

## 2024-09-13 LAB — C-REACTIVE PROTEIN: CRP: 4.9 mg/dL — ABNORMAL HIGH (ref <1.0)

## 2024-09-13 LAB — GLUCOSE, CAPILLARY: Glucose-Capillary: 144 mg/dL — ABNORMAL HIGH (ref 70–99)

## 2024-09-13 MED ORDER — LIDOCAINE HCL URETHRAL/MUCOSAL 2 % EX GEL
1.0000 | Freq: Once | CUTANEOUS | Status: DC
  Filled 2024-09-13: qty 6

## 2024-09-13 MED ORDER — CHLORHEXIDINE GLUCONATE CLOTH 2 % EX PADS
6.0000 | MEDICATED_PAD | Freq: Every day | CUTANEOUS | Status: DC
  Administered 2024-09-14 – 2024-09-21 (×8): 6 via TOPICAL

## 2024-09-13 MED ORDER — IOHEXOL 350 MG/ML SOLN
75.0000 mL | Freq: Once | INTRAVENOUS | Status: AC | PRN
  Administered 2024-09-13: 75 mL via INTRAVENOUS

## 2024-09-13 MED ORDER — DEXTROSE 5 % IV SOLN
10.0000 mg/kg | INTRAVENOUS | Status: DC
  Administered 2024-09-13 – 2024-09-14 (×2): 455 mg via INTRAVENOUS
  Filled 2024-09-13 (×3): qty 9.1

## 2024-09-13 MED ORDER — VALACYCLOVIR HCL 500 MG PO TABS
1000.0000 mg | ORAL_TABLET | Freq: Every day | ORAL | Status: DC
  Filled 2024-09-13: qty 2

## 2024-09-13 NOTE — Progress Notes (Signed)
 SLP Cancellation Note  Patient Details Name: Noah Burnett MRN: [id number] DOB: Jan 09, 1941   Cancelled treatment:       Reason Eval/Treat Not Completed: Patient at procedure or test/unavailable (MRI). SLP will f/u as able.    Damien Blumenthal, M.A., CCC-SLP Speech Language Pathology, Acute Rehabilitation Services  Secure Chat preferred [phone]  09/13/2024, 3:36 PM

## 2024-09-13 NOTE — Code Documentation (Signed)
 Inpatient code stroke activated at 1000 on Noah Burnett who is inpatient on [address]. Pt has PMH of HTN DM Duodenal mass, shingles failure to thrive. He was admitted to [facility] on 10/31 for evaluation of falls and confusion. He was last known well sometime yesterday. His RN today noted aphasia and rt sided weakness and Code Stroke was activated.   NIHSS 19. Pt with Lt gaze, rt hemiplegia and aphasia on exam. BP, CBG WNL. Pt was taken to CT scan. Per Dr. Lindzen, questionable amount [facility] on CT scan. CTA negative for LVO. Pt is not a candidate for TNK as OOW. He is ineligible for Endovascular therapy due to no LVO on advanced imaging.   Pt's exam improved following imaging. Able to speak but dysarthric. Moving rt side against gravity now. Pt returned to [address] [place]. He will need q 2 hour VS and NIHSS for 12 hr, then q4. He will need to be NPO until passes Stroke Swallow screen, or SLP evaluation. Bedside handoff with charge RN Tracey complete.

## 2024-09-13 NOTE — Evaluation (Signed)
 Clinical/Bedside Swallow Evaluation Patient Details  Name: Noah Burnett MRN: [id number] Date of Birth: February 12, 1941  Today's Date: 09/13/2024 Time: SLP Start Time (ACUTE ONLY): 1650 SLP Stop Time (ACUTE ONLY): 1714 SLP Time Calculation (min) (ACUTE ONLY): 24 min  Past Medical History:  Past Medical History:  Diagnosis Date   Arthritis    Bladder tumor    Deafness, left    Full dentures    GERD (gastroesophageal reflux disease)    History of [place] spotted fever    01-25-2020  per pt had in 2018 and was treated w/ no residual   Hyperplasia of prostate with lower urinary tract symptoms (LUTS)    Hypertension    followed by pcp   (01-25-2020  per pt had stress test approx. 2005, told normal , done in [place] somewhere)   Pre-diabetes    followed by pcp ,   watches diet   Prostate cancer [facility]) urologist--- dr watt   dx by bx01-13-2020,  Gleason 3+4 ;  MRI 12-19-2018, vol 96.26;   active survillance   Shingles    Wears glasses    Wears hearing aid in right ear    Past Surgical History:  Past Surgical History:  Procedure Laterality Date   SHOULDER ARTHROSCOPY WITH ROTATOR CUFF REPAIR AND SUBACROMIAL DECOMPRESSION Right 05-20-2009  @[facility]    TRANSURETHRAL RESECTION OF BLADDER TUMOR WITH MITOMYCIN -C N/A 02/01/2020   Procedure: TRANSURETHRAL RESECTION OF BLADDER TUMOR;  Surgeon: Watt Rush, MD;  Location: [facility];  Service: Urology;  Laterality: N/A;   TRANSURETHRAL RESECTION OF PROSTATE     HPI:  83 yo male with history fo prostate cancer, tobacco abuse, HTN, GERD, and 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 MRI 11/1 concerning for enhancing and non-enhancing lesions with broad differential. Decreased verbalizations, increased R sided weakness, and R sided neglect observed 11/5, code stroke called. MRI 11/5 shows small volume L parietal  SAH and patchy cortical and subcortical signal abnormality throughout both cerebral hemispheres, overall slightly progressed from 11/1 and indeterminate although encephalitis and vasculitis remains leading considerations.    Assessment / Plan / Recommendation  Clinical Impression  Recommend he resume POs but with bite-sized solids and thin liquids. Sit upright during meals and for ~30 minutes after given history of GERD. Will continue following for ongoing assessment.   Pt presents with R CN VII deficits that affect his ability to create intraoral pressure for drinking from a straw. This is also suspected to be contributed to by large upper dentures, which pt has difficulty closing his lips around. Minimal anterior loss of thin liquids was observed, though he is able to contain the majority of each bolus and swallows without signs clinically concerning for aspiration even when challenged with large, consecutive sips. Mastication was prolonged and pt complained of consistent palatal residue that he is able to clear with a liquid wash or lingual sweep. Discussed options of diet modification and pt/his family agree with trying softer solids.  SLP Visit Diagnosis: Dysphagia, unspecified (R13.10)    Aspiration Risk  Mild aspiration risk    Diet Recommendation Dysphagia 3 (Mech soft);Thin liquid    Liquid Administration via: Cup;Straw Medication Administration: Whole meds with liquid Supervision: Staff to assist with self feeding;Full supervision/cueing for compensatory strategies Compensations: Minimize environmental distractions;Slow rate;Small sips/bites;Lingual sweep for clearance of pocketing;Monitor for anterior loss Postural Changes: Seated upright at 90 degrees;Remain upright for at least 30 minutes  after po intake    Other  Recommendations Oral Care Recommendations: Oral care BID     Assistance Recommended at Discharge    Functional Status Assessment Patient has had a recent decline in  their functional status and demonstrates the ability to make significant improvements in function in a reasonable and predictable amount of time.  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 MRI 11/1 concerning for enhancing and non-enhancing lesions with broad differential. Decreased verbalizations, increased R sided weakness, and R sided neglect observed 11/5, code stroke called. MRI 11/5 shows small volume L parietal SAH and patchy cortical and subcortical signal abnormality throughout both cerebral hemispheres, overall slightly progressed from 11/1 and indeterminate although encephalitis and vasculitis remains leading considerations. Type of Study: Bedside Swallow Evaluation Previous Swallow Assessment: none in chart Diet Prior to this Study: Regular;Thin liquids (Level 0) Temperature Spikes Noted: No Respiratory Status: Room air History of Recent Intubation: No Behavior/Cognition: Alert;Cooperative;Requires cueing Oral Cavity Assessment: Within Functional Limits Oral Care Completed by SLP: No Oral Cavity - Dentition: Dentures, top Vision: Functional for self-feeding Self-Feeding Abilities: Able to feed self;Needs set up Patient Positioning: Upright in bed Baseline Vocal Quality: Normal Volitional Cough: Strong Volitional Swallow: Able to elicit    Oral/Motor/Sensory Function Overall Oral Motor/Sensory Function: Mild impairment Facial ROM: Reduced right Facial Symmetry: Abnormal symmetry right Lingual Symmetry: Within Functional Limits   Ice Chips Ice chips: Not tested   Thin Liquid Thin  Liquid: Impaired Presentation: Straw;Self Fed Oral Phase Impairments: Reduced labial seal    Nectar Thick Nectar Thick Liquid: Not tested   Honey Thick Honey Thick Liquid: Not tested   Puree Puree: Not tested   Solid     Solid: Impaired Presentation: Self Fed Oral Phase Impairments: Reduced labial seal Oral Phase Functional Implications: Impaired mastication      Damien Blumenthal, M.A., CCC-SLP Speech Language Pathology, Acute Rehabilitation Services  Secure Chat preferred [phone]  09/13/2024,5:25 PM

## 2024-09-13 NOTE — Plan of Care (Signed)
 Problem: Education: Goal: Knowledge of General Education information will improve Description: Including pain rating scale, medication(s)/side effects and non-pharmacologic comfort measures Outcome: Progressing   Problem: Health Behavior/Discharge Planning: Goal: Ability to manage health-related needs will improve Outcome: Progressing   Problem: Clinical Measurements: Goal: Ability to maintain clinical measurements within normal limits will improve Outcome: Progressing Goal: Will remain free from infection Outcome: Progressing Goal: Diagnostic test results will improve Outcome: Progressing Goal: Respiratory complications will improve Outcome: Progressing Goal: Cardiovascular complication will be avoided Outcome: Progressing   Problem: Activity: Goal: Risk for activity intolerance will decrease Outcome: Progressing   Problem: Nutrition: Goal: Adequate nutrition will be maintained Outcome: Progressing   Problem: Coping: Goal: Level of anxiety will decrease Outcome: Progressing   Problem: Elimination: Goal: Will not experience complications related to bowel motility Outcome: Progressing Goal: Will not experience complications related to urinary retention Outcome: Progressing   Problem: Pain Managment: Goal: General experience of comfort will improve and/or be controlled Outcome: Progressing   Problem: Safety: Goal: Ability to remain free from injury will improve Outcome: Progressing   Problem: Skin Integrity: Goal: Risk for impaired skin integrity will decrease Outcome: Progressing   Problem: Nutrition Goal: Patient maintains adequate hydration Outcome: Progressing Goal: Patient maintains weight Outcome: Progressing Goal: Patient/Family demonstrates understanding of diet Outcome: Progressing Goal: Patient/Family independently completes tube feeding Outcome: Progressing Goal: Patient will have no more than 5 lb weight change during LOS Outcome:  Progressing Goal: Patient will utilize adaptive techniques to administer nutrition Outcome: Progressing Goal: Patient will verbalize dietary restrictions Outcome: Progressing   Problem: Education: Goal: Ability to describe self-care measures that may prevent or decrease complications (Diabetes Survival Skills Education) will improve Outcome: Progressing Goal: Individualized Educational Video(s) Outcome: Progressing   Problem: Coping: Goal: Ability to adjust to condition or change in health will improve Outcome: Progressing   Problem: Fluid Volume: Goal: Ability to maintain a balanced intake and output will improve Outcome: Progressing   Problem: Health Behavior/Discharge Planning: Goal: Ability to identify and utilize available resources and services will improve Outcome: Progressing Goal: Ability to manage health-related needs will improve Outcome: Progressing   Problem: Metabolic: Goal: Ability to maintain appropriate glucose levels will improve Outcome: Progressing   Problem: Nutritional: Goal: Maintenance of adequate nutrition will improve Outcome: Progressing Goal: Progress toward achieving an optimal weight will improve Outcome: Progressing   Problem: Skin Integrity: Goal: Risk for impaired skin integrity will decrease Outcome: Progressing   Problem: Tissue Perfusion: Goal: Adequacy of tissue perfusion will improve Outcome: Progressing   Problem: Education: Goal: Knowledge of disease or condition will improve Outcome: Progressing Goal: Knowledge of secondary prevention will improve (MUST DOCUMENT ALL) Outcome: Progressing Goal: Knowledge of patient specific risk factors will improve (DELETE if not current risk factor) Outcome: Progressing   Problem: Ischemic Stroke/TIA Tissue Perfusion: Goal: Complications of ischemic stroke/TIA will be minimized Outcome: Progressing   Problem: Coping: Goal: Will verbalize positive feelings about self Outcome:  Progressing Goal: Will identify appropriate support needs Outcome: Progressing   Problem: Health Behavior/Discharge Planning: Goal: Ability to manage health-related needs will improve Outcome: Progressing Goal: Goals will be collaboratively established with patient/family Outcome: Progressing   Problem: Self-Care: Goal: Ability to participate in self-care as condition permits will improve Outcome: Progressing Goal: Verbalization of feelings and concerns over difficulty with self-care will improve Outcome: Progressing Goal: Ability to communicate needs accurately will improve Outcome: Progressing   Problem: Nutrition: Goal: Risk of aspiration will decrease Outcome: Progressing Goal: Dietary intake will improve Outcome: Progressing

## 2024-09-13 NOTE — Progress Notes (Signed)
   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 Palliative care has been asked to support goals of care conversations.   Today's Discussion 09/13/2024  *Please note that this is a verbal dictation therefore any spelling or grammatical errors are due to the Dragon Medical One system interpretation.  Chart reviewed inclusive of vital signs, progress notes of Dr. Cherlyn, Avelina Clause - SLP, Andrez George, RN - TOC, Dr. Melia - Nephrology, laboratory results Na 128, K 4.1 CHL 93, CO2 23, Glu 147, BUN 12, Cr 1.61, Ca 8.1, Anion Gap 12, Mg 2.5, GFR > 60, and diagnostic images - CT scan w/ new SAH.  I met at bedside with Noah Burnett this morning. He is less alert for me today and appears more disoriented as he does not share his name with me nor where he is this morning.   I was able to speak to patients bedside RN this morning who was planning to perform her morning assessment.  I received a secure chat from the Neurology team - [facility] who shares that a code stroke had been initiated. Patient with a new trace left superior subarachnoid bleed. Plan at this point will be for MRI and EEG.  I met with Noah Burnett son, Aleene at bedside. Discussed that at this time more information is being discovered. We reviewed the plan for ongoing support pending additional information. I shared that this may very well change the trajectory for Noah Burnett. I shared the importance pending further results on having a family goals of care conversation. He was understanding of this.   Questions and concerns addressed/Palliative Support Provided.   Objective Assessment: Vital Signs Vitals:   09/13/24 0352 09/13/24 0757  BP: (!) 144/76 134/71   Pulse: 67 62  Resp: 16 15  Temp: (!) 97.3 F (36.3 C) 98.6 F (37 C)  SpO2: 100% 99%    Intake/Output Summary (Last 24 hours) at 09/13/2024 9040 Last data filed at 09/13/2024 9566 Gross per 24 hour  Intake 690.41 ml  Output --  Net 690.41 ml   Last Weight  Most recent update: 09/08/2024  4:29 PM    Weight  45.6 kg (100 lb 8.5 oz)            Gen: Elderly Caucasian male chronically ill in appearance HEENT: Dry mucous membranes CV: Regular rate and rhythm PULM: On room air breathing is even and nonlabored ABD: soft/nontender EXT: No edema Neuro: Arouses, aware of person and place  SUMMARY OF RECOMMENDATIONS   Full code/full scope of care   Acute changes at this time in the setting of SAH - MRI and EEG is pending --> Based upon results a GOC meeting with family is likely to be warranted   Emotional support provided   Ongoing palliative care support as needed ______________________________________________________________________________________ Rosaline Becton [place] Palliative Medicine Team Team Cell Phone: [phone] Please utilize secure chat with additional questions, if there is no response within 30 minutes please call the above phone number  Billing based on MDM: Moderate   Palliative Medicine Team providers are available by phone from 7am to 7pm daily and can be reached through the team cell phone.  Should this patient require assistance outside of these hours, please call the patient's attending physician.

## 2024-09-13 NOTE — Progress Notes (Signed)
 EEG complete - results pending

## 2024-09-13 NOTE — Progress Notes (Signed)
 Pharmacy Antibiotic Note  Noah Burnett is a 83 y.o. male admitted on 09/08/2024 with failure to thrive and confusion.  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  SCr 0.76 > 1.61, CrCL ~23 ml/min, low Na.  Plan: Resume acyclovir at 10mg /kg IV Q24H NS at 100 ml/hr already ordered Monitor renal fxn, clinical progress   Height: 5' 7 (170.2 cm) Weight: 45.6 kg (100 lb 8.5 oz) IBW/kg (Calculated) : 66.1  Temp (24hrs), Avg:97.9 F (36.6 C), Min:97.3 F (36.3 C), Max:98.6 F (37 C)  Recent Labs  Lab 09/09/24 0333 09/10/24 0255 09/11/24 0258 09/12/24 0158 09/13/24 0155  WBC 10.1 8.5 6.1 11.2* 13.7*  CREATININE 0.67 0.73 0.67 0.76 1.61*    Estimated Creatinine Clearance: 23.2 mL/min (A) (by C-G formula based on SCr of 1.61 mg/dL (H)).    Allergies  Allergen Reactions   Haemophilus Influenzae Vaccines Other (See Comments)    I get deathly sick    Valen Gillison D. Lendell, PharmD, BCPS, BCCCP 09/13/2024, 7:26 PM

## 2024-09-13 NOTE — Procedures (Signed)
 Patient Name: Noah Burnett  MRN: [id number]  Epilepsy Attending: Arlin MALVA Krebs  Referring Physician/Provider: Remi Pippin, NP  Date: 09/13/2024 Duration: 24.05 mins  Patient history: 83 year old male with altered mental status.  EEG evaluate for seizure.  Level of alertness: Awake  AEDs during EEG study: None  Technical aspects: This EEG study was done with scalp electrodes positioned according to the 10-20 International system of electrode placement. Electrical activity was reviewed with band pass filter of 1-70Hz , sensitivity of 7 uV/mm, display speed of 93mm/sec with a 60Hz  notched filter applied as appropriate. EEG data were recorded continuously and digitally stored.  Video monitoring was available and reviewed as appropriate.  Description: EEG showed continuous generalized 3 to 6 Hz theta-delta slowing admixed with 12-15hz  beta activity. Hyperventilation and photic stimulation were not performed.     ABNORMALITY - Continuous slow, generalized  IMPRESSION: This study is suggestive of generalized cerebral dysfunction (encephalopathy). No seizures or epileptiform discharges were seen throughout the recording.  Lucendia Leard O Ankith Edmonston

## 2024-09-13 NOTE — Progress Notes (Signed)
 Verbal order to in and out cath once for bladder scan result 900. In and out cath attempted, unable to advance catheter due to resistance and increase pain.Night shift RN notified to make night shift attending aware.

## 2024-09-13 NOTE — Progress Notes (Signed)
 NEUROLOGY CONSULT FOLLOW UP NOTE   Date of service: September 13, 2024 Patient Name: Noah Burnett MRN:  [id number] DOB:  Nov 10, 1940  Interval Hx/subjective  VSV CSF PCR in process. Continuing antiviral for 7 days total. Acyclovir changed to valacyclovir 1gm PO for duration of therapy. He worsened acutely today; unclear LKW. On exam today he is minimally verbal, with left gaze preference, right side is weaker today than on previous exams. Has some right sided neglect as well. Code Stroke was called and he was emergently sent to CT. In CT his exam steadily improved.   Vitals   Vitals:   09/12/24 2010 09/13/24 0006 09/13/24 0352 09/13/24 0757  BP: (!) 150/78 (!) 141/66 (!) 144/76 134/71  Pulse: (!) 58 (!) 58 67 62  Resp:  17 16 15   Temp: 97.9 F (36.6 C) 97.9 F (36.6 C) (!) 97.3 F (36.3 C) 98.6 F (37 C)  TempSrc: Oral Oral Oral Oral  SpO2: 98% 98% 100% 99%  Weight:      Height:         Body mass index is 15.75 kg/m.  Physical Exam   General: Well-nourished, well-developed elderly patient resting comfortably with eyes open HEENT: Normocephalic atraumatic Lungs: Respirations regular and unlabored on room air   NEURO:  Mental Status: Tracks examiner, no verbal output initially. Eventually says ow and stop that with noxious stimuli  Does not answer orientation questions Follows commands  Cranial Nerves:  II: PERRL.  III, IV, VI: Left sided gaze preference initially, then with improvement to EOMI. Eyelids elevate symmetrically.  V: Sensation is intact to coarse touch but more sensitive on the left VII: Right facial droop VIII: Hearing intact to voice. IX, X: Mildly dysarthric XII: tongue is midline without fasciculations. Motor: Less responsive of the right to noxious stimuli.  Bilateral lower extremities right weaker than left to commands, but withdraws to noxious stimuli more on the right than left. Moves antigravity  Tone is normal and bulk is normal Sensation-  Intact to light touch bilaterally.  Coordination: FTN intact bilaterally Gait- deferred  1a Level of Conscious.: 0 1b LOC Questions: 2 1c LOC Commands: 0 2 Best Gaze: 1 3 Visual: 0 4 Facial Palsy: 1 5a Motor Arm - left: 0 5b Motor Arm - Right: 3 6a Motor Leg - Left: 2 6b Motor Leg - Right: 3 7 Limb Ataxia: 0 8 Sensory: 1 9 Best Language: 3 10 Dysarthria: 2 11 Extinct. and Inatten.: 1 TOTAL: 21  Medications  Current Facility-Administered Medications:    0.9 %  sodium chloride  infusion, , Intravenous, Continuous, Akula, Vijaya, MD, Last Rate: 50 mL/hr at 09/13/24 0433, Infusion Verify at 09/13/24 0433   acetaminophen  (TYLENOL ) tablet 650 mg, 650 mg, Oral, Q6H PRN, Tat, David, MD   aspirin EC tablet 81 mg, 81 mg, Oral, Daily, Arora, Ashish, MD, 81 mg at 09/12/24 0929   enoxaparin (LOVENOX) injection 30 mg, 30 mg, Subcutaneous, Q24H, Tat, David, MD, 30 mg at 09/12/24 2139   feeding supplement (BOOST / RESOURCE BREEZE) liquid 1 Container, 1 Container, Oral, TID BM, Tat, David, MD, 1 Container at 09/12/24 0951   loperamide (IMODIUM) capsule 2 mg, 2 mg, Oral, PRN, Howerter, Justin B, DO, 2 mg at 09/11/24 2328   multivitamin with minerals tablet 1 tablet, 1 tablet, Oral, Daily, Tat, David, MD, 1 tablet at 09/12/24 0929   ondansetron  (ZOFRAN ) tablet 4 mg, 4 mg, Oral, Q6H PRN **OR** ondansetron  (ZOFRAN ) injection 4 mg, 4 mg, Intravenous, Q6H PRN, Tat,  Alm, MD   pantoprazole  (PROTONIX ) EC tablet 40 mg, 40 mg, Oral, BID, Akula, Vijaya, MD, 40 mg at 09/12/24 2139   polyethylene glycol (MIRALAX / GLYCOLAX) packet 17 g, 17 g, Oral, Daily, Tat, David, MD, 17 g at 09/12/24 0929   rosuvastatin (CRESTOR) tablet 10 mg, 10 mg, Oral, Daily, Arora, Ashish, MD, 10 mg at 09/12/24 9070   senna (SENOKOT) tablet 17.2 mg, 2 tablet, Oral, Daily, Tat, David, MD, 17.2 mg at 09/12/24 9070   sodium chloride  tablet 1 g, 1 g, Oral, TID WC, Melia Lynwood ORN, MD, 1 g at 09/12/24 1649   valACYclovir (VALTREX) tablet  1,000 mg, 1,000 mg, Oral, QPC supper, Luiz Channel, MD   vitamin B-12 (CYANOCOBALAMIN) tablet 500 mcg, 500 mcg, Oral, Daily, Cherlyn Labella, MD, 500 mcg at 09/12/24 0929  Labs and Diagnostic Imaging   CBC:  Recent Labs  Lab 09/12/24 0158 09/13/24 0155  WBC 11.2* 13.7*  NEUTROABS 9.6* 12.1*  HGB 15.5 15.4  HCT 43.6 43.7  MCV 84.7 85.7  PLT 234 227    Basic Metabolic Panel:  Lab Results  Component Value Date   NA 128 (L) 09/13/2024   K 4.1 09/13/2024   CO2 23 09/13/2024   GLUCOSE 147 (H) 09/13/2024   BUN 17 09/13/2024   CREATININE 1.61 (H) 09/13/2024   CALCIUM 8.1 (L) 09/13/2024   GFRNONAA 43 (L) 09/13/2024   GFRAA >60 03/27/2018   Lipid Panel:  Lab Results  Component Value Date   LDLCALC 75 09/10/2024   HgbA1c:  Lab Results  Component Value Date   HGBA1C 5.6 09/10/2024    CSF results: Tube #1: 2 WBCs, 12 RBCs, protein 65, glucose 44 Tube #4: 1 WBC, 0 RBC Meningitis encephalitis panel negative Cytology pending Flow cytometry pending Autoimmune panel/paraneoplastic panel pending VDRL pending CSF Gram stain - WBC present with no organisms VZV PCR serum pending VZV PCR CSF pending [facility] spotted fever antibodies pending  CT Head (Code Stroke this AM 11/5): Trace new left superior convexity subarachnoid hemorrhage (1-2 sulci), without mass effect. Otherwise stable non contrast CT appearance of the brain: Nonspecific white matter changes, No convincing cortically based infarct by CT. ASPECTS 10.  MRI brain (11/1): multiple subcortical contrast-enhancing areas greater in the right parieto-occipital fissure but present at multiple subcortical locations in both MCAs. Small areas of diffusion abnormality at the anterior insula on the left, left frontal operculum and right frontal operculum in multiple locations in both corona radiata and some lesions appearing more acute than others based on lower ADC values. Differentials include infectious versus  inflammatory encephalitis, VZV or other vasculitis, as well as recent embolic showers, with demyelinating disease being lower on the DDx  Routine EEG: Intermittent generalized slowing  Assessment  JOVONI BORKENHAGEN is an 83 y.o. male presenting for progressive confusion after having gone through some procedures for GI issues for duodenal mass-not sure if there is any malignancy-family reports no malignancy.  MRI was performed which is concerning for enhancing and nonenhancing lesions with broad differential which includes VZV vasculitis/encephalitis, inflammatory, other infectious, and paraneoplastic processes. Also of note-had shingles infection which was treated, so a viral encephalitis is felt to be highest on the DDx. Per the literature, approximately 1/400 cases of VZV reactivation present with VZV encephalitis.  - Already on acyclovir for viral encephalitis coverage. Changed to Valacyclovir. Will continue for a total of 7 days.  - As of right now, only glucose, protein and cell count have been reported.  Cell count  is not suspicious for a meningitis, but does not rule out a vasculitis.  He does have increased protein in CSF. Overall, MRI contrast-enhancing lesions are most suspicious for VZV encephalitis/vasculitis, and patient has improved since starting acyclovir.  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  - Patient does have a history of [place] spotted fever a few years ago which was appropriately treated per chart and family and his MRI brain is also compatible with this.  However, although RMSF could also cause encephalitis, it would be unlikely that this would happen several years after diagnosis and treatment.  Patient's family states that when he is in his normal state of health, he is often outdoors and may have had an unnoticed tick bite.  However, he has been less active since his GI issues began in July. - Regarding his duodenal mass,  there are [facility] notes from 9/25 stating that he had a duodenal stricture, without any dysplasia or malignancy.    Recommendations  # For the acute and subacute small vessel strokes on the MRI - 2D echo pending, A1c at goal below 6, LDL near goal.  Goal is less than 70, his LDL is 75. - Encephalopathy and strokes may be able to tie down together with 1 diagnosis of vasculitis, especially VZV vasculitis given recent shingles.  Will send VZV PCR serum and CSF as meningitis/encephalitis panel was negative  Acute Neuro Change - Stat CT head and CTA Head and Neck completed with trace new left superior convexity subarachnoid hemorrhage, without mass effect. - ASA 81 mg discontinued for now  - MRI Brain - rEEG  # For the encephalopathy - Await full testing results from CSF - Continue antiviral for a total of 7 days   - Acyclovir transitioned to valacyclovir. For some reason valacyclovir has been discontinued. Contacting ID for clarification.   - No need for antiseizure medications-no evidence of seizure and EEG unremarkable  Addendum:  Repeat MRI brain (11/5):  1. Prominent patchy cortical and subcortical signal abnormality throughout both cerebral hemispheres, overall slightly progressed from 09/09/2024 and indeterminate although encephalitis and vasculitis remain leading considerations. 2. Small volume left parietal subarachnoid hemorrhage.  Addendum: Repeat EEG (11/5): Continuous slow, generalized. This study is suggestive of generalized cerebral dysfunction (encephalopathy). No seizures or epileptiform discharges were seen throughout the recording.  ______________________________________________________________________  Patient seen and examined by NP/APP with MD.  Jorene Last, DNP, FNP-BC [facility] Pager: [phone]  Electronically signed: Dr. Keyvon Herter

## 2024-09-13 NOTE — Progress Notes (Signed)
 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 Extend for longer period of time 10-14 days as cr can tolerate  Will give ivf to minimize aki fro acyclovir.  Montie FURY Luiz MD MPH [facility] [phone]

## 2024-09-13 NOTE — Progress Notes (Addendum)
 Speech Language Pathology Treatment: Cognitive-Linguistic  Patient Details Name: Noah Burnett MRN: [id number] DOB: 02/12/1941 Today's Date: 09/13/2024 Time: 0906-0920 SLP Time Calculation (min) (ACUTE ONLY): 14 min  Assessment / Plan / Recommendation Clinical Impression  Skilled therapy session focused on communication goals. Patient lethargic upon SLP entrance, with brief eye opening given verbal and tactile stimulation. Patients son present, reporting limited attempts at communication this date - occasionally with facial expressions - however reports patient has been restless with lack of sleep. SLP targeted communication goals through prompting patient to share biographical information and participate in automatic speech tasks. Patient with only x1 spontaneous verbalization when SLP repositioned patient in bed. Patient did not attempt to vocalize name nor sons name. Patient required consistent verbal cues to keep eyes open, and was responsive to name - [name]. SLP provided total A for patient to clean face with wet wash cloth and maxA for patient to move hand to wave bye. Patient left in bed with son present. Nursing aware of neurological change & increased lethargy.    HPI HPI: Noah Burnett is a 83 y.o. 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  This was diagnosed at [facility].  He had an EGD that showed an hiatal hernia and compression of the second portion of the duodenum with a possible intraluminal neoplasm.  Biopsies were obtained which showed severely inflamed and ulcerated duodenal mucosa, but no evidence of malignancy.  A second EGD was done with ultrasound guidance, but again pathology of the lesion extensive necrosis.  Surgical oncology was consulted and he underwent a robotic gastrojejunostomy on 8/25.  He did have a delayed return of bowel function and had a PICC line with TPN for several days  until he had advancement of his diet.  Since his procedure, he has had a fairly steady decline with decreasing appetite, significant weight loss, inability to walk.  He had follow-up with surgical oncology.  He had a repeat MRI which did not show the duodenal mass.  Today, the patient began to exhibit confusion and had an unwitnessed fall.  He was brought in to the hospital for evaluation.  Here, the patient continues to be altered with no clear speech.  He mumbles and is unable to give any meaningful history.  The history is obtained by the patient's son who relayed all the above information.  Daughter noted recent shingles outbreak resulting in encephalopathy per daughter's report.       Recommendations                 Dysarthria and anarthria (R47.1);Cognitive communication deficit (R41.841)            Vernessa Likes M.A., CCC-SLP 09/13/2024, 9:23 AM

## 2024-09-13 NOTE — Progress Notes (Signed)
 PROGRESS NOTE    Noah Burnett  FMW:[id number] DOB: [birth year] DOA: 09/08/2024 PCP: [name], MD   Brief Narrative:  83 year old male with a history of prostate cancer (on observation), tobacco abuse, hypertension, GERD, duodenal mass resulting in gastric L obstruction status post robotic gastrojejunostomy 07/03/2024 presenting with decreased oral intake, generalized weakness, and confusion.   Patient had hospital admission at [facility] from 8/18 to 07/09/24 when he presented with nausea and vomiting.  He was found to have gastric outlet obstruction and duodenal mass. He had an EGD that showed an hiatal hernia and compression of the second portion of the duodenum with a possible intraluminal neoplasm. Biopsies were obtained which showed severely inflamed and ulcerated duodenal mucosa, but no evidence of malignancy. A second EGD was done with ultrasound guidance, but again pathology of the lesion extensive necrosis. Surgical oncology was consulted and he underwent a robotic gastrojejunostomy on 8/25. He did have a delayed return of bowel function and had a PICC line with TPN for several days until he had advancement of his diet. Since his hospitalization, he has had a fairly steady functional decline with decreasing appetite, significant weight loss, and generalized weakness.  After discharge, the patient was tolerating a diet and still driving and walking independently.   Unfortunately, the patient developed shingles in the last week of September 2025.  He went to see ophthalmology, and he was cleared from their standpoint.  He finished a course of Valtrex.  He continues to have some postherpetic neuralgia.  Daughter states that since his shingles episode, the patient has had a functional decline with decreasing appetite, significant weight loss, and generalized weakness.   The patient has followed up with surgical oncology on 08/31/2024.  At that time the patient had reported generalized  weakness and fatigue, but he did not have any vomiting or abdominal pain at that time.  He had poor p.o. intake and loss of appetite at that time.  Repeat MRI of the abdomen and pelvis was obtained.  This was performed on 09/06/2024 which showed no specific MR findings to suggest a duodenal mass.  There was Generalized edema-like signal in the retroperitoneum is presumed reactive in the setting of known duodenal ulceration.  There were multiple benign appearing hepatic lesions presumed to be cysts or hemangioma.  There was Bosniak 2 renal cysts.       The patient has not complained of any specific issues.  There has been no fevers, chills, headache, chest pain, shortness breath, nausea, vomiting, diarrhea, abdominal pain.  In fact he has been struggling with constipation. Daughter states that in the past week he has had increasing confusion and generalized weakness with continued decreased oral intake.   UA was negative for pyuria.  CT of the brain was negative for acute findings.  Acute abdominal series was negative for obstruction or pneumoperitoneum.   Neurology consulted for evaluation of acute encephalopathy. Family at bedside reports all his symptoms, generalized weakness, confusion,  dysarthria, lack of appetite started after he was diagnosed with shingles.  His hospital course complicated by acute on chronic hyponatremia. Nephrology consulted and he was started on salt tablets.    Therapy evaluations recommending CIR.     Assessment & Plan:   Principal Problem:   Failure to thrive in adult Active Problems:   Essential hypertension   Gastric outlet obstruction   H/O bypass gastrojejunostomy   Hyponatremia   Prediabetes   Protein-calorie malnutrition, severe  Failure to thrive/acute metabolic encephalopathy MRI  of the brain shows multiple areas of subcortical enhancement in the parieto-occipital lobe.  Differential include encephalitis versus vasculitis versus demyelinating disease  vs stroke.  Neurology consulted and patient was transferred from [facility] to [facility]. Patient underwent fluoroscopic guided lumbar puncture and fluid sent for analysis for evaluation of meningitis and encephalitis. Procalcitonin is negative, CRP is 4.1.  Echocardiogram shows preserved LVEF, with grade 1 diastolic dysfunction.  EEG done, showed diffuse cerebral dysfunction, no epileptiform discharges seen throughout the recording.  VZVPCR is pending.  Varicella IgG negative.  Other workup negative result. Patient noted to have left gaze preference and decreased responsiveness today.  Code stroke was called earlier, patient was seen by neurology.  Stat CT head was done which shows left superior convexity subarachnoid hemorrhage without mass effect.  Otherwise no changes from previous CT head.  CT angiogram ruled out large vessel occlusion.  Patient was kept n.p.o., SLP has been consulted.  MRI is ordered by neurology which is pending.  Repeat EEG is going to be done as well.  Appreciate neurology help.   Chronic hyponatremia: Possibly from SIADH. Sodium in August, 2025 was 129.  Patient was admitted with a sodium of 126 on 10/21.TSH slightly elevated. Am cortisol 30. Serum osmo is 257, urine sodium is 137, urine osmolality is 490.  Nephrology on board.  Started on salt tablets.  Sodium improving.  AKI: Creatinine jumped to 1.61 today from 0.76 yesterday, clinically appears to be dehydrated.  Will increase normal saline frequency to 100 cc/h.   GERD/ Duodenal ulceration, stricture and mass s/p  robotic gastrojejunostomy on 8/25. Continue with PPI   Hypertension: On hydrochlorothiazide at home which is on hold, blood pressure controlled.   Hypokalemia: Low again, will replenish.  Hypomagnesemia: Resolved.  Recently diagnosed Shingles On valacyclovir.  GOC: Patient remains full code.  Discussed with the son, he wants the patient to be full code however he told me that the siblings are  having detailed conversation about the CODE STATUS and this might change.  Palliative is also following.  DVT prophylaxis: enoxaparin (LOVENOX) injection 30 mg Start: 09/09/24 2200   Code Status: Full Code  Family Communication: Son present at bedside.  Plan of care discussed with son and he was already aware about the plan by neurology.  Status is: Inpatient Remains inpatient appropriate because: Worsening neurological status.   Estimated body mass index is 15.75 kg/m as calculated from the following:   Height as of this encounter: 5' 7 (1.702 m).   Weight as of this encounter: 45.6 kg.    Nutritional Assessment: Body mass index is 15.75 kg/m.SABRA Seen by dietician.  I agree with the assessment and plan as outlined below: Nutrition Status: Nutrition Problem: Severe Malnutrition Etiology: chronic illness Signs/Symptoms: energy intake < or equal to 50% for > or equal to 1 month, severe fat depletion, severe muscle depletion, percent weight loss Percent weight loss: 18 % Interventions: Boost Breeze, Magic cup, MVI  . Skin Assessment: I have examined the patient's skin and I agree with the wound assessment as performed by the wound care RN as outlined below:    Consultants:  Neurology  Procedures:  As above  Antimicrobials:  Anti-infectives (From admission, onward)    Start     Dose/Rate Route Frequency Ordered Stop   09/13/24 1800  valACYclovir (VALTREX) tablet 1,000 mg        1,000 mg Oral Daily after supper 09/13/24 0846 09/16/24 1759   09/09/24 1500  acyclovir (ZOVIRAX) 455  mg in dextrose  5 % 100 mL IVPB  Status:  Discontinued        10 mg/kg  45.6 kg 109.1 mL/hr over 60 Minutes Intravenous Every 12 hours 09/09/24 1331 09/13/24 0846         Subjective: Patient seen and examined, son at the bedside.  Patient was getting placed leads on the head for EEG.  Patient had left gaze preference.  He was alert and was listening to my commands and was following the commands  by lifting the extremities but he continued to have left gaze preference and did not talk at all.  Was unable to follow some other commands such as closing his eyes.  Objective: Vitals:   09/13/24 0006 09/13/24 0352 09/13/24 0757 09/13/24 0958  BP: (!) 141/66 (!) 144/76 134/71 109/76  Pulse: (!) 58 67 62   Resp: 17 16 15    Temp: 97.9 F (36.6 C) (!) 97.3 F (36.3 C) 98.6 F (37 C) 98.1 F (36.7 C)  TempSrc: Oral Oral Oral Axillary  SpO2: 98% 100% 99%   Weight:      Height:        Intake/Output Summary (Last 24 hours) at 09/13/2024 1145 Last data filed at 09/13/2024 0433 Gross per 24 hour  Intake 690.41 ml  Output --  Net 690.41 ml   Filed Weights   09/08/24 1138 09/08/24 1626  Weight: 51.2 kg 45.6 kg    Examination:  General exam: Appears calm and comfortable  Respiratory system: Clear to auscultation. Respiratory effort normal. Cardiovascular system: S1 & S2 heard, RRR. No JVD, murmurs, rubs, gallops or clicks. No pedal edema. Gastrointestinal system: Abdomen is nondistended, soft and nontender. No organomegaly or masses felt. Normal bowel sounds heard. Central nervous system: Alert, unable to assess orientation, patient is nonverbal at the moment.  Left gaze preference.  Lifting all extremities with commands. Extremities: Symmetric 5 x 5 power. Skin: No rashes, lesions or ulcers  Data Reviewed: I have personally reviewed following labs and imaging studies  CBC: Recent Labs  Lab 09/08/24 1229 09/09/24 0333 09/10/24 0255 09/11/24 0258 09/12/24 0158 09/13/24 0155  WBC 7.5 10.1 8.5 6.1 11.2* 13.7*  NEUTROABS 4.9  --  6.2 4.2 9.6* 12.1*  HGB 14.2 16.0 14.6 13.4 15.5 15.4  HCT 41.5 48.2 42.1 37.5* 43.6 43.7  MCV 88.7 91.6 87.3 85.4 84.7 85.7  PLT 229 199 214 183 234 227   Basic Metabolic Panel: Recent Labs  Lab 09/08/24 1229 09/09/24 0333 09/10/24 0255 09/11/24 0258 09/11/24 1829 09/12/24 0158 09/13/24 0155  NA 128* 128* 127* 125* 126* 125* 128*  K 3.4*  3.7 4.1 3.6  --  3.0* 4.1  CL 90* 92* 91* 93*  --  91* 93*  CO2 29 22 24  21*  --  20* 23  GLUCOSE 96 81 95 85  --  145* 147*  BUN 11 9 9 12   --  12 17  CREATININE 0.74 0.67 0.73 0.67  --  0.76 1.61*  CALCIUM 8.7* 8.6* 8.3* 7.9*  --  8.0* 8.1*  MG 2.1  --  1.7 1.8  --  1.7 2.5*  PHOS  --   --  3.6  --   --   --   --    GFR: Estimated Creatinine Clearance: 23.2 mL/min (A) (by C-G formula based on SCr of 1.61 mg/dL (H)). Liver Function Tests: Recent Labs  Lab 09/08/24 1229 09/10/24 0255 09/11/24 0258 09/12/24 0158 09/13/24 0155  AST 14* 14* 12* 21 23  ALT 12 11 9 14 12   ALKPHOS 88 65 58 66 63  BILITOT 0.8 1.1 1.6* 1.4* 1.0  PROT 6.0* 5.6* 4.7* 5.6* 5.3*  ALBUMIN 4.0 3.0* 2.6* 3.0* 3.0*   Recent Labs  Lab 09/08/24 1229  LIPASE 27   No results for input(s): AMMONIA in the last 168 hours. Coagulation Profile: Recent Labs  Lab 09/10/24 0255  INR 1.0   Cardiac Enzymes: Recent Labs  Lab 09/10/24 0255  CKTOTAL 30*   BNP (last 3 results) No results for input(s): PROBNP in the last 8760 hours. HbA1C: No results for input(s): HGBA1C in the last 72 hours. CBG: Recent Labs  Lab 09/09/24 1201 09/09/24 1242 09/13/24 1000  GLUCAP 68* 141* 144*   Lipid Profile: No results for input(s): CHOL, HDL, LDLCALC, TRIG, CHOLHDL, LDLDIRECT in the last 72 hours. Thyroid Function Tests: No results for input(s): TSH, T4TOTAL, FREET4, T3FREE, THYROIDAB in the last 72 hours. Anemia Panel: No results for input(s): VITAMINB12, FOLATE, FERRITIN, TIBC, IRON, RETICCTPCT in the last 72 hours. Sepsis Labs: Recent Labs  Lab 09/10/24 0255 09/11/24 0258 09/12/24 0158 09/13/24 0155  PROCALCITON <0.10 <0.10 <0.10 0.10    Recent Results (from the past 240 hours)  CSF culture w Gram Stain     Status: None (Preliminary result)   Collection Time: 09/10/24  7:10 AM   Specimen: CSF; Cerebrospinal Fluid  Result Value Ref Range Status   Specimen  Description CSF  Final   Special Requests NONE  Final   Gram Stain   Final    WBC PRESENT, PREDOMINANTLY MONONUCLEAR NO ORGANISMS SEEN CYTOSPIN SMEAR    Culture   Final    NO GROWTH 3 DAYS Performed at [facility], [address] [place], [place] [address]    Report Status PENDING  Incomplete     Radiology Studies: CT ANGIO HEAD NECK W WO CM (CODE STROKE) Result Date: 09/13/2024 EXAM: CTA HEAD AND NECK WITHOUT AND WITH 09/13/2024 10:29:28 AM TECHNIQUE: CTA of the head and neck was performed without and with the administration of 75 mL of iohexol  (OMNIPAQUE ) 350 MG/ML injection. Multiplanar 2D and/or 3D reformatted images are provided for review. Automated exposure control, iterative reconstruction, and/or weight based adjustment of the mA/kV was utilized to reduce the radiation dose to as low as reasonably achievable. Stenosis of the internal carotid arteries measured using NASCET criteria. COMPARISON: Code stroke head CT 09/13/2024 (reported separately), recent CTA head and neck 09/10/2024. CLINICAL HISTORY: 83 year old male with acute neuro deficit, stroke suspected. FINDINGS: CTA NECK: AORTIC ARCH AND ARCH VESSELS: Tortuous aortic arch with mild forage calcified atherosclerosis. 3 vessel arch configuration. No dissection or arterial injury. No significant stenosis of the brachiocephalic or subclavian arteries. Proximal subclavian arteries are stable with tortuosity, no stenosis. CERVICAL CAROTID ARTERIES: Right carotid artery remains patent to the skull base with stable atherosclerosis at the right ICA origin less than 50% stenosis. Left carotid artery remains patent to the skull base with stable atherosclerosis at the left ICA origin and bulb, narrowing the vessel to 1.9 mm versus 5 mm normally, resulting in 62% stenosis. No dissection or arterial injury. CERVICAL VERTEBRAL ARTERIES: Dominant left vertebral artery is patent and normal to the skull base. Nondominant right vertebral artery  is patent, with stable mild stenosis at its origin. Stable mild irregularity and stenosis of the dominant left vertebral artery as it crosses the dura, mild left V4 irregularity. Patent left PICA origin. The left vertebral primarily supplies the basilar artery as  before. Distal right vertebral artery is stable with mild irregularity and stenosis, patent right PICA origin. No dissection or arterial injury. LUNGS AND MEDIASTINUM: Centrilobular emphysema. Negative visible superior mediastinum. SOFT TISSUES: Negative nonvascular neck soft tissue spaces. No acute abnormality. BONES: Absent dentition. Exaggerated cervical lordosis and upper thoracic kyphosis. No acute abnormality. CTA HEAD: ANTERIOR CIRCULATION: Bilateral ICA siphons remain patent. Left siphon moderate calcified plaque and small supraclinoid left ICA 2 to 3 mm aneurysm (series 15 image 109) are stable. Only mild left siphon stenosis. Right ICA siphon moderate calcified plaque and mild to moderate supraclinoid right ICA stenosis are stable. MCA and ACA origins remain patent. Nondominant left A1 segment redemonstrated. Dominant left ACA A2 segment redemonstrated. Bilateral ACA branches are stable. Left MCA M1 segment and bifurcation remain patent without stenosis. Right MCA M1 segment and bifurcation remain patent without stenosis. Bilateral MCA branches are stable. No other aneurysm identified in the anterior circulation. POSTERIOR CIRCULATION: Basilar artery remains patent. SCA and PCA origins are patent. Bilateral PCA branches remain patent, mild to moderate left P2 segment irregularity does not appear significantly changed. No other aneurysm identified in the posterior circulation. OTHER: Early venous contrast timing on this exam, major dural venous sinuses appear grossly patent. Preliminary Report of this exam discussed by telephone with Neurology provider [facility] at 1035 hours. IMPRESSION: 1. Stable CTA head and neck since 09/10/24 with no large vessel  occlusion. 2. Stable atherosclerosis at the left ICA origin and bulb with 62% stenosis. 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 And stable less pronounced atherosclerosis elsewhere. 3. Emphysema. Electronically signed by: Helayne Hurst MD 09/13/2024 10:45 AM EST RP Workstation: [id number]   CT HEAD CODE STROKE WO CONTRAST Result Date: 09/13/2024 EXAM: CT HEAD WITHOUT CONTRAST 09/13/2024 10:14:31 AM TECHNIQUE: CT of the head was performed without the administration of intravenous contrast. Automated exposure control, iterative reconstruction, and/or weight based adjustment of the mA/kV was utilized to reduce the radiation dose to as low as reasonably achievable. COMPARISON: Brain MRI 09/09/2024, Head CT 09/10/2024. CLINICAL HISTORY: 83 year old male with acute neuro deficit, stroke suspected. FINDINGS: BRAIN AND VENTRICLES: Trace new left superior convexity subarachnoid blood, 1 to 2 sulci affected and best seen on series 6 image 39 sagittal images. No progressive edema there, no mass effect. No other acute intracranial hemorrhage. No convincing cortically based infarct by CT. No hydrocephalus. No extra-axial collection. No mass effect or midline shift. Stable brain volume. Patchy and confluent, mild to moderate periventricular white matter hypodensity has not significantly changed. Asymmetric right superior temporal gyrus white matter hypodensity also stable to recent exams. Calcified atherosclerosis. No suspicious intracranial vascular hyperdensity. ORBITS: No acute abnormality. Mild leftward gaze. SINUSES: Paranasal sinuses, middle ears and mastoids remain well aerated. SOFT TISSUES AND SKULL: No acute soft tissue abnormality. No skull fracture. [place] stroke program early CT score (aspects) ----- Ganglionic (caudate, ic, lentiform nucleus, insula, M1-m3): 7 Supraganglionic (m4-m6): 3 Total: 10 IMPRESSION: 1. Trace new left superior convexity subarachnoid hemorrhage (12 sulci), without mass effect.  2. Otherwise stable non contrast CT appearance of the brain: Nonspecific white matter changes, No convincing cortically based infarct by CT. ASPECTS 10. 3. These results were communicated to Dr. Lindzen at 10:23 hours on 09/13/2024 by text page via the [facility] messaging system. Electronically signed by: Helayne Hurst MD 09/13/2024 10:26 AM EST RP Workstation: [id number]    Scheduled Meds:  enoxaparin (LOVENOX) injection  30 mg Subcutaneous Q24H   feeding supplement  1 Container Oral TID BM  multivitamin with minerals  1 tablet Oral Daily   pantoprazole   40 mg Oral BID   polyethylene glycol  17 g Oral Daily   rosuvastatin  10 mg Oral Daily   senna  2 tablet Oral Daily   sodium chloride   1 g Oral TID WC   valACYclovir  1,000 mg Oral QPC supper   vitamin B-12  500 mcg Oral Daily   Continuous Infusions:  sodium chloride  50 mL/hr at 09/13/24 0433     LOS: 5 days   Fredia Skeeter, MD [facility]  09/13/2024, 11:45 AM   *Please note that this is a verbal dictation therefore any spelling or grammatical errors are due to the Dragon [employer] One system interpretation.  Please page via Amion and do not message via secure chat for urgent patient care matters. Secure chat can be used for non urgent patient care matters.  How to contact the [facility] Attending or Consulting provider 7A - 7P or covering provider during after hours 7P -7A, for this patient?  Check the care team in [facility] and look for a) attending/consulting [facility] provider listed and b) the [facility] team listed. Page or secure chat 7A-7P. Log into [email or web address] and use [place]'s universal password to access. If you do not have the password, please contact the hospital operator. Locate the [facility] provider you are looking for under [facility] and page to a number that you can be directly reached. If you still have difficulty reaching the provider, please page the [facility] (Director on Call) for the Hospitalists listed on amion for  assistance.

## 2024-09-13 NOTE — Progress Notes (Signed)
 IP rehab admissions - We received a denial from insurance carrier for inpatient rehab.  Family is discussing whether they would like to appeal this decision.  Will await family decision.  [phone]

## 2024-09-13 NOTE — Progress Notes (Signed)
 Noah Burnett is an 83 y.o. 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  He had a robotic gastrojejunostomy on 8/25 requiring TPN via PICC subsequently.  Patient reportedly has had decreased appetite, weight loss and inability to walk.  Reportedly repeat MRI which did not show the duodenal mass.  Patient brought in for failure to thrive as well as a unwitnessed fall. CT head was negative for large vessel occlusion, no e/o vasculitis, no other acute intracranial abnormality. Denies any NSAID use but he has not been eating much at all for at least a month.   Assessment/Plan:  Hyponatremia - with a chronic component with SNa running at baseline in the 126-129 range. Initial TSH 3.7 but repeat 6.2 FT4 nl., Ur Na 137 and urine osm 490. 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 Cortisol HL, TSH nl and then high but FT4 nl  - Urine Na, K <154 - Incr NaCl tablets to TID; need solute to excrete free water. Gave trial of low dose Lasix to decrease concentration gradient as well.  - If he drops below 120 will need 3% but trying to avoid.   Sodium improving but renal function worsened; he has a h/o untreated prostate cancer (monitoring) and has a purewick. Will check a PVR; if neg wouild give isotonic fluid challenge.   Hypertension - BP actually on the lower side Failure to thrive - present for over a month AMS -MRI of the brain showed areas of subcortical enhancement in the parietal occipital lobe seen by neurology, LP sent as well. Neurology treating for viral encephalitis  empirically.   H/o prostate cancer H/o tobacco use as well  Subjective: Denies f/c/n/v/sob. Daughter bedside.   Chemistry and CBC: Creatinine, Ser  Date/Time Value Ref Range Status  09/13/2024 01:55 AM 1.61 (H) 0.61 - 1.24 mg/dL Final  88/95/7974 98:41 AM 0.76 0.61 - 1.24 mg/dL Final  88/96/7974 97:41 AM 0.67 0.61 - 1.24 mg/dL Final  88/97/7974 97:44 AM 0.73 0.61 - 1.24 mg/dL Final  88/98/7974 96:66 AM 0.67 0.61 - 1.24 mg/dL Final  89/68/7974 87:70 PM 0.74 0.61 - 1.24 mg/dL Final  89/78/7974 95:93 PM 0.83 0.61 - 1.24 mg/dL Final  91/87/7974 94:53 PM 0.79 0.61 - 1.24 mg/dL Final  96/74/7978 90:52 AM 0.70 0.61 - 1.24 mg/dL Final  94/80/7980 89:96 AM 0.72 0.61 - 1.24 mg/dL Final   Recent Labs  Lab 09/08/24 1229 09/09/24 0333 09/10/24 0255 09/11/24 0258 09/11/24 1829 09/12/24 0158 09/13/24 0155  NA 128* 128* 127* 125* 126* 125* 128*  K 3.4* 3.7 4.1 3.6  --  3.0* 4.1  CL 90* 92* 91* 93*  --  91* 93*  CO2 29 22 24  21*  --  20* 23  GLUCOSE 96 81 95 85  --  145* 147*  BUN 11 9 9 12   --  12 17  CREATININE 0.74 0.67 0.73 0.67  --  0.76 1.61*  CALCIUM 8.7* 8.6* 8.3* 7.9*  --  8.0* 8.1*  PHOS  --   --  3.6  --   --   --   --    Recent Labs  Lab 09/10/24 0255 09/11/24 0258 09/12/24 0158 09/13/24 0155  WBC 8.5 6.1 11.2* 13.7*  NEUTROABS 6.2 4.2 9.6* 12.1*  HGB 14.6 13.4 15.5 15.4  HCT 42.1 37.5* 43.6 43.7  MCV 87.3 85.4 84.7 85.7  PLT 214 183 234 227   Liver Function Tests: Recent Labs  Lab 09/11/24 0258 09/12/24 0158 09/13/24 0155  AST 12* 21 23  ALT 9 14 12   ALKPHOS 58 66 63  BILITOT 1.6* 1.4* 1.0  PROT 4.7* 5.6* 5.3*  ALBUMIN 2.6* 3.0* 3.0*   Recent Labs  Lab 09/08/24 1229  LIPASE 27   No results for input(s): AMMONIA in the last 168 hours. Cardiac Enzymes: Recent Labs  Lab 09/10/24 0255  CKTOTAL 30*   Iron Studies: No results for input(s): IRON, TIBC, TRANSFERRIN, FERRITIN in the last 72 hours. PT/INR: @LABRCNTIP (inr:5)  Xrays/Other  Studies: ) Results for orders placed or performed during the hospital encounter of 09/08/24 (from the past 48 hours)  Sodium     Status: Abnormal   Collection Time: 09/11/24  6:29 PM  Result Value Ref Range   Sodium 126 (L) 135 - 145 mmol/L    Comment: Performed at [facility], [address] [place], [place] [address]  CBC with Differential/Platelet     Status: Abnormal   Collection Time: 09/12/24  1:58 AM  Result Value Ref Range   WBC 11.2 (H) 4.0 - 10.5 K/uL   RBC 5.15 4.22 - 5.81 MIL/uL   Hemoglobin 15.5 13.0 - 17.0 g/dL   HCT 56.3 60.9 - 47.9 %   MCV 84.7 80.0 - 100.0 fL   MCH 30.1 26.0 - 34.0 pg   MCHC 35.6 30.0 - 36.0 g/dL   RDW 83.9 (H) 88.4 - 84.4 %   Platelets 234 150 - 400 K/uL   nRBC 0.0 0.0 - 0.2 %   Neutrophils Relative % 86 %   Neutro Abs 9.6 (H) 1.7 - 7.7 K/uL   Lymphocytes Relative 6 %   Lymphs Abs 0.6 (L) 0.7 - 4.0 K/uL   Monocytes Relative 8 %   Monocytes Absolute 0.9 0.1 - 1.0 K/uL   Eosinophils Relative 0 %   Eosinophils Absolute 0.1 0.0 - 0.5 K/uL   Basophils Relative 0 %   Basophils Absolute 0.0 0.0 - 0.1 K/uL   Immature Granulocytes 0 %   Abs Immature Granulocytes 0.05 0.00 - 0.07 K/uL    Comment: Performed at [facility], [address] [place], [place] [address]  Comprehensive metabolic panel with GFR     Status: Abnormal   Collection Time: 09/12/24  1:58 AM  Result Value Ref Range   Sodium 125 (L) 135 - 145 mmol/L   Potassium 3.0 (L) 3.5 - 5.1 mmol/L   Chloride 91 (L) 98 - 111 mmol/L   CO2 20 (L) 22 - 32 mmol/L   Glucose, Bld 145 (H) 70 - 99 mg/dL    Comment: Glucose reference range applies only to samples taken after fasting for at least 8 hours.   BUN 12 8 - 23 mg/dL   Creatinine, Ser 9.23 0.61 - 1.24 mg/dL   Calcium 8.0 (L) 8.9 - 10.3 mg/dL   Total Protein 5.6 (L) 6.5 - 8.1 g/dL   Albumin 3.0 (L) 3.5 - 5.0 g/dL   AST 21 15 - 41 U/L   ALT 14 0 - 44 U/L   Alkaline Phosphatase 66 38 - 126 U/L   Total Bilirubin 1.4 (H) 0.0 -  1.2 mg/dL  GFR, Estimated >60 >60 mL/min    Comment: (NOTE) Calculated using the CKD-EPI Creatinine Equation (2021)    Anion gap 14 5 - 15    Comment: Performed at [facility], [address], [place], [place] [address]  Magnesium     Status: None   Collection Time: 09/12/24  1:58 AM  Result Value Ref Range   Magnesium 1.7 1.7 - 2.4 mg/dL    Comment: Performed at [facility], [address], [place], [place] [address]  Procalcitonin     Status: None   Collection Time: 09/12/24  1:58 AM  Result Value Ref Range   Procalcitonin <0.10 ng/mL    Comment:        Interpretation: PCT (Procalcitonin) <= 0.5 ng/mL: Systemic infection (sepsis) is not likely. Local bacterial infection is possible. (NOTE)       Sepsis PCT Algorithm           Lower Respiratory Tract                                      Infection PCT Algorithm    ----------------------------     ----------------------------         PCT < 0.25 ng/mL                PCT < 0.10 ng/mL          Strongly encourage             Strongly discourage   discontinuation of antibiotics    initiation of antibiotics    ----------------------------     -----------------------------       PCT 0.25 - 0.50 ng/mL            PCT 0.10 - 0.25 ng/mL               OR       >80% decrease in PCT            Discourage initiation of                                            antibiotics      Encourage discontinuation           of antibiotics    ----------------------------     -----------------------------         PCT >= 0.50 ng/mL              PCT 0.26 - 0.50 ng/mL               AND        <80% decrease in PCT             Encourage initiation of                                             antibiotics       Encourage continuation           of antibiotics    ----------------------------     -----------------------------        PCT >= 0.50 ng/mL  PCT > 0.50 ng/mL               AND         increase in PCT                   Strongly encourage                                      initiation of antibiotics    Strongly encourage escalation           of antibiotics                                     -----------------------------                                           PCT <= 0.25 ng/mL                                                 OR                                        > 80% decrease in PCT                                      Discontinue / Do not initiate                                             antibiotics  Performed at [facility], [address] [place], [place] [address]   C-reactive protein     Status: Abnormal   Collection Time: 09/12/24  1:58 AM  Result Value Ref Range   CRP 3.8 (H) <1.0 mg/dL    Comment: Performed at [facility], [address], [place], [place] [address]  Cortisol-am, blood     Status: Abnormal   Collection Time: 09/12/24  1:58 AM  Result Value Ref Range   Cortisol - AM 30.7 (H) 6.7 - 22.6 ug/dL    Comment: Performed at [facility], [address], [place], [place] [address]  CBC with Differential/Platelet     Status: Abnormal   Collection Time: 09/13/24  1:55 AM  Result Value Ref Range   WBC 13.7 (H) 4.0 - 10.5 K/uL   RBC 5.10 4.22 - 5.81 MIL/uL   Hemoglobin 15.4 13.0 - 17.0 g/dL   HCT 56.2 60.9 - 47.9 %   MCV 85.7 80.0 - 100.0 fL   MCH 30.2 26.0 - 34.0 pg   MCHC 35.2 30.0 - 36.0 g/dL   RDW 83.5 (H) 88.4 - 84.4 %   Platelets 227 150 - 400 K/uL   nRBC 0.0 0.0 - 0.2 %   Neutrophils Relative % 88 %   Neutro Abs 12.1 (H)  1.7 - 7.7 K/uL   Lymphocytes Relative 3 %   Lymphs Abs 0.5 (L) 0.7 - 4.0 K/uL   Monocytes Relative 8 %   Monocytes Absolute 1.0 0.1 - 1.0 K/uL   Eosinophils Relative 0 %   Eosinophils Absolute 0.0 0.0 - 0.5 K/uL   Basophils Relative 0 %   Basophils Absolute 0.0 0.0 - 0.1 K/uL   Immature Granulocytes 1 %   Abs Immature Granulocytes 0.08 (H) 0.00 - 0.07 K/uL    Comment: Performed at [facility], [address] [place], [place] [address]  Comprehensive metabolic panel with GFR     Status: Abnormal   Collection Time: 09/13/24  1:55 AM  Result Value Ref Range   Sodium 128 (L) 135 - 145 mmol/L   Potassium 4.1 3.5 - 5.1 mmol/L   Chloride 93 (L) 98 - 111 mmol/L   CO2 23 22 - 32 mmol/L   Glucose, Bld 147 (H) 70 - 99 mg/dL    Comment: Glucose reference range applies only to samples taken after fasting for at least 8 hours.   BUN 17 8 - 23 mg/dL   Creatinine, Ser 8.38 (H) 0.61 - 1.24 mg/dL   Calcium 8.1 (L) 8.9 - 10.3 mg/dL   Total Protein 5.3 (L) 6.5 - 8.1 g/dL   Albumin 3.0 (L) 3.5 - 5.0 g/dL   AST 23 15 - 41 U/L   ALT 12 0 - 44 U/L   Alkaline Phosphatase 63 38 - 126 U/L   Total Bilirubin 1.0 0.0 - 1.2 mg/dL   GFR, Estimated 43 (L) >60 mL/min    Comment: (NOTE) Calculated using the CKD-EPI Creatinine Equation (2021)    Anion gap 12 5 - 15    Comment: Performed at [facility], [address], [place], [place] [address]  Magnesium     Status: Abnormal   Collection Time: 09/13/24  1:55 AM  Result Value Ref Range   Magnesium 2.5 (H) 1.7 - 2.4 mg/dL    Comment: Performed at [facility], [address] [place], [place] [address]  Procalcitonin     Status: None   Collection Time: 09/13/24  1:55 AM  Result Value Ref Range   Procalcitonin 0.10 ng/mL    Comment:        Interpretation: PCT (Procalcitonin) <= 0.5 ng/mL: Systemic infection (sepsis) is not likely. Local bacterial infection is possible. (NOTE)       Sepsis PCT Algorithm           Lower Respiratory Tract                                      Infection PCT Algorithm    ----------------------------     ----------------------------         PCT < 0.25 ng/mL                PCT < 0.10 ng/mL          Strongly encourage             Strongly discourage   discontinuation of antibiotics    initiation of antibiotics    ----------------------------     -----------------------------       PCT 0.25 - 0.50 ng/mL            PCT 0.10 -  0.25 ng/mL  OR       >80% decrease in PCT            Discourage initiation of                                            antibiotics      Encourage discontinuation           of antibiotics    ----------------------------     -----------------------------         PCT >= 0.50 ng/mL              PCT 0.26 - 0.50 ng/mL               AND        <80% decrease in PCT             Encourage initiation of                                             antibiotics       Encourage continuation           of antibiotics    ----------------------------     -----------------------------        PCT >= 0.50 ng/mL                  PCT > 0.50 ng/mL               AND         increase in PCT                  Strongly encourage                                      initiation of antibiotics    Strongly encourage escalation           of antibiotics                                     -----------------------------                                           PCT <= 0.25 ng/mL                                                 OR                                        > 80% decrease in PCT                                      Discontinue / Do not initiate  antibiotics  Performed at [facility], [address], [place], [place] [address]   C-reactive protein     Status: Abnormal   Collection Time: 09/13/24  1:55 AM  Result Value Ref Range   CRP 4.9 (H) <1.0 mg/dL    Comment: Performed at [facility], [address] [place], [place] [address]  Glucose, capillary     Status: Abnormal   Collection Time: 09/13/24 10:00 AM  Result Value Ref Range   Glucose-Capillary 144 (H) 70 - 99 mg/dL    Comment: Glucose reference range applies only to samples taken after fasting for at least 8 hours.   EEG adult Result Date: 09/13/2024 Shelton Arlin KIDD, MD     09/13/2024  2:58 PM Patient Name: Noah Burnett MRN: [id number] Epilepsy Attending: Arlin KIDD Shelton Referring Physician/Provider: Remi Pippin, NP Date: 09/13/2024 Duration: 24.05 mins Patient history: 84 year old male with altered mental status.  EEG evaluate for seizure. Level of alertness: Awake AEDs during EEG study: None Technical aspects: This EEG study was done with scalp electrodes positioned according to the 10-20 International system of electrode placement. Electrical activity was reviewed with band pass filter of 1-70Hz , sensitivity of 7 uV/mm, display speed of 56mm/sec with a 60Hz  notched filter applied as appropriate. EEG data were recorded continuously and digitally stored.  Video monitoring was available and reviewed as appropriate. Description: EEG showed continuous generalized 3 to 6 Hz theta-delta slowing admixed with 12-15hz  beta activity. Hyperventilation and photic stimulation were not performed.   ABNORMALITY - Continuous slow, generalized IMPRESSION: This study is suggestive of generalized cerebral dysfunction (encephalopathy). No seizures or epileptiform discharges were seen throughout the recording. Priyanka KIDD Shelton   CT ANGIO HEAD NECK W WO CM (CODE STROKE) Result Date: 09/13/2024 EXAM: CTA HEAD AND NECK WITHOUT AND WITH 09/13/2024 10:29:28 AM TECHNIQUE: CTA of the head and neck was performed without and with the administration of 75 mL of iohexol  (OMNIPAQUE ) 350 MG/ML injection. Multiplanar 2D and/or 3D reformatted images are provided for review. Automated exposure control, iterative reconstruction, and/or weight based adjustment of the mA/kV was utilized to reduce the radiation dose to as low as reasonably achievable. Stenosis of the internal carotid arteries measured using NASCET criteria. COMPARISON: Code stroke head CT 09/13/2024 (reported separately), recent CTA head and neck 09/10/2024. CLINICAL HISTORY: 83 year old male with acute neuro deficit, stroke suspected. FINDINGS: CTA NECK: AORTIC ARCH AND ARCH VESSELS: Tortuous aortic arch with mild forage calcified  atherosclerosis. 3 vessel arch configuration. No dissection or arterial injury. No significant stenosis of the brachiocephalic or subclavian arteries. Proximal subclavian arteries are stable with tortuosity, no stenosis. CERVICAL CAROTID ARTERIES: Right carotid artery remains patent to the skull base with stable atherosclerosis at the right ICA origin less than 50% stenosis. Left carotid artery remains patent to the skull base with stable atherosclerosis at the left ICA origin and bulb, narrowing the vessel to 1.9 mm versus 5 mm normally, resulting in 62% stenosis. No dissection or arterial injury. CERVICAL VERTEBRAL ARTERIES: Dominant left vertebral artery is patent and normal to the skull base. Nondominant right vertebral artery is patent, with stable mild stenosis at its origin. Stable mild irregularity and stenosis of the dominant left vertebral artery as it crosses the dura, mild left V4 irregularity. Patent left PICA origin. The left vertebral primarily supplies the basilar artery as before. Distal right vertebral artery is stable with mild irregularity and stenosis, patent right PICA origin. No dissection or arterial injury. LUNGS AND  MEDIASTINUM: Centrilobular emphysema. Negative visible superior mediastinum. SOFT TISSUES: Negative nonvascular neck soft tissue spaces. No acute abnormality. BONES: Absent dentition. Exaggerated cervical lordosis and upper thoracic kyphosis. No acute abnormality. CTA HEAD: ANTERIOR CIRCULATION: Bilateral ICA siphons remain patent. Left siphon moderate calcified plaque and small supraclinoid left ICA 2 to 3 mm aneurysm (series 15 image 109) are stable. Only mild left siphon stenosis. Right ICA siphon moderate calcified plaque and mild to moderate supraclinoid right ICA stenosis are stable. MCA and ACA origins remain patent. Nondominant left A1 segment redemonstrated. Dominant left ACA A2 segment redemonstrated. Bilateral ACA branches are stable. Left MCA M1 segment and  bifurcation remain patent without stenosis. Right MCA M1 segment and bifurcation remain patent without stenosis. Bilateral MCA branches are stable. No other aneurysm identified in the anterior circulation. POSTERIOR CIRCULATION: Basilar artery remains patent. SCA and PCA origins are patent. Bilateral PCA branches remain patent, mild to moderate left P2 segment irregularity does not appear significantly changed. No other aneurysm identified in the posterior circulation. OTHER: Early venous contrast timing on this exam, major dural venous sinuses appear grossly patent. Preliminary Report of this exam discussed by telephone with Neurology provider [facility] at 1035 hours. IMPRESSION: 1. Stable CTA head and neck since 09/10/24 with no large vessel occlusion. 2. Stable atherosclerosis at the left ICA origin and bulb with 62% stenosis. 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 And stable less pronounced atherosclerosis elsewhere. 3. Emphysema. Electronically signed by: Helayne Hurst MD 09/13/2024 10:45 AM EST RP Workstation: [id number]   CT HEAD CODE STROKE WO CONTRAST Result Date: 09/13/2024 EXAM: CT HEAD WITHOUT CONTRAST 09/13/2024 10:14:31 AM TECHNIQUE: CT of the head was performed without the administration of intravenous contrast. Automated exposure control, iterative reconstruction, and/or weight based adjustment of the mA/kV was utilized to reduce the radiation dose to as low as reasonably achievable. COMPARISON: Brain MRI 09/09/2024, Head CT 09/10/2024. CLINICAL HISTORY: 83 year old male with acute neuro deficit, stroke suspected. FINDINGS: BRAIN AND VENTRICLES: Trace new left superior convexity subarachnoid blood, 1 to 2 sulci affected and best seen on series 6 image 39 sagittal images. No progressive edema there, no mass effect. No other acute intracranial hemorrhage. No convincing cortically based infarct by CT. No hydrocephalus. No extra-axial collection. No mass effect or midline shift. Stable brain  volume. Patchy and confluent, mild to moderate periventricular white matter hypodensity has not significantly changed. Asymmetric right superior temporal gyrus white matter hypodensity also stable to recent exams. Calcified atherosclerosis. No suspicious intracranial vascular hyperdensity. ORBITS: No acute abnormality. Mild leftward gaze. SINUSES: Paranasal sinuses, middle ears and mastoids remain well aerated. SOFT TISSUES AND SKULL: No acute soft tissue abnormality. No skull fracture. [place] stroke program early CT score (aspects) ----- Ganglionic (caudate, ic, lentiform nucleus, insula, M1-m3): 7 Supraganglionic (m4-m6): 3 Total: 10 IMPRESSION: 1. Trace new left superior convexity subarachnoid hemorrhage (12 sulci), without mass effect. 2. Otherwise stable non contrast CT appearance of the brain: Nonspecific white matter changes, No convincing cortically based infarct by CT. ASPECTS 10. 3. These results were communicated to Dr. Lindzen at 10:23 hours on 09/13/2024 by text page via the [facility] messaging system. Electronically signed by: Helayne Hurst MD 09/13/2024 10:26 AM EST RP Workstation: [id number]    PMH:   Past Medical History:  Diagnosis Date   Arthritis    Bladder tumor    Deafness, left    Full dentures    GERD (gastroesophageal reflux disease)    History of [place] spotted fever  01-25-2020  per pt had in 2018 and was treated w/ no residual   Hyperplasia of prostate with lower urinary tract symptoms (LUTS)    Hypertension    followed by pcp   (01-25-2020  per pt had stress test approx. 2005, told normal , done in [place] somewhere)   Pre-diabetes    followed by pcp ,   watches diet   Prostate cancer [facility]) urologist--- dr watt   dx by bx01-13-2020,  Gleason 3+4 ;  MRI 12-19-2018, vol 96.26;   active survillance   Shingles    Wears glasses    Wears hearing aid in right ear     PSH:   Past Surgical History:  Procedure Laterality Date   SHOULDER ARTHROSCOPY WITH  ROTATOR CUFF REPAIR AND SUBACROMIAL DECOMPRESSION Right 05-20-2009  @[facility]    TRANSURETHRAL RESECTION OF BLADDER TUMOR WITH MITOMYCIN -C N/A 02/01/2020   Procedure: TRANSURETHRAL RESECTION OF BLADDER TUMOR;  Surgeon: Watt Rush, MD;  Location: [facility];  Service: Urology;  Laterality: N/A;   TRANSURETHRAL RESECTION OF PROSTATE      Allergies:  Allergies  Allergen Reactions   Haemophilus Influenzae Vaccines Other (See Comments)    I get deathly sick    Medications:   Prior to Admission medications   Medication Sig Start Date End Date Taking? Authorizing Provider  acetaminophen  (TYLENOL ) 500 MG tablet Take 1,000 mg by mouth every 6 (six) hours as needed for headache or mild pain (pain score 1-3).   Yes [provider]  docusate sodium (COLACE) 100 MG capsule Take 100 mg by mouth 2 (two) times daily.   Yes [provider]  hydrochlorothiazide (HYDRODIURIL) 12.5 MG tablet Take 12.5 mg by mouth daily. 02/16/23  Yes [provider]  megestrol (MEGACE) 40 MG/ML suspension Take 400 mg by mouth every morning. 09/04/24  Yes [provider]  omeprazole (PRILOSEC) 40 MG capsule Take 40 mg by mouth daily. 02/16/23  Yes [provider]  polyethylene glycol (MIRALAX / GLYCOLAX) 17 g packet Take 17 g by mouth daily.   Yes [provider]    Discontinued Meds:   Medications Discontinued During This Encounter  Medication Reason   bisacodyl (DULCOLAX) 5 MG EC tablet Change in therapy   magnesium hydroxide (MILK OF MAGNESIA) 400 MG/5ML suspension Patient Preference   mirtazapine (REMERON SOL-TAB) disintegrating tablet 15 mg    enoxaparin (LOVENOX) injection 40 mg    lactated ringers  infusion    TPN ADULT (ION)    insulin aspart (novoLOG) injection 0-9 Units    feeding supplement (ENSURE PLUS HIGH PROTEIN) liquid 237 mL    lactated ringers  infusion    valACYclovir (VALTREX) 1000 MG tablet Completed Course   lactated ringers  infusion     0.9 %  sodium chloride  infusion    sodium chloride  tablet 1 g    pantoprazole  (PROTONIX ) EC tablet 40 mg    acyclovir (ZOVIRAX) 455 mg in dextrose  5 % 100 mL IVPB    aspirin EC tablet 81 mg    valACYclovir (VALTREX) tablet 1,000 mg     Social History:  reports that he quit smoking about 25 years ago. His smoking use included cigarettes. He started smoking about 61 years ago. He has a 36 pack-year smoking history. He has been exposed to tobacco smoke. He has never used smokeless tobacco. He reports that he does not currently use alcohol. He reports that he does not use drugs.  Family History:  History reviewed. No pertinent family history.  Blood pressure (!) 141/73, pulse (!) 50, temperature 97.6 F (36.4 C), temperature source Axillary, resp. rate 15, height 5' 7 (1.702 m), weight 45.6 kg, SpO2 100%. Physical Exam: General appearance: alert and cooperative Head: NCAT Neck: no adenopathy Back: symmetric Resp: CTA b/l Cardio: regular rate and rhythm GI: soft, non-tender; bowel sounds normal Extremities: edema none Pulses: 2+ and symmetric     Alvin Rubano, [facility] ORN, MD 09/13/2024, 3:23 PM

## 2024-09-13 NOTE — Plan of Care (Signed)
 Problem: Education: Goal: Knowledge of General Education information will improve Description: Including pain rating scale, medication(s)/side effects and non-pharmacologic comfort measures 09/13/2024 0433 by Jori Roderic CROME, RN Outcome: Progressing 09/13/2024 0428 by Jori Roderic CROME, RN Outcome: Progressing   Problem: Health Behavior/Discharge Planning: Goal: Ability to manage health-related needs will improve 09/13/2024 0433 by Jori Roderic CROME, RN Outcome: Progressing 09/13/2024 0428 by Jori Roderic CROME, RN Outcome: Progressing   Problem: Clinical Measurements: Goal: Ability to maintain clinical measurements within normal limits will improve 09/13/2024 0433 by Jori Roderic CROME, RN Outcome: Progressing 09/13/2024 0428 by Jori Roderic CROME, RN Outcome: Progressing Goal: Will remain free from infection 09/13/2024 0433 by Jori Roderic CROME, RN Outcome: Progressing 09/13/2024 0428 by Jori Roderic CROME, RN Outcome: Progressing Goal: Diagnostic test results will improve 09/13/2024 0433 by Jori Roderic CROME, RN Outcome: Progressing 09/13/2024 0428 by Jori Roderic CROME, RN Outcome: Progressing Goal: Respiratory complications will improve 09/13/2024 0433 by Jori Roderic CROME, RN Outcome: Progressing 09/13/2024 0428 by Jori Roderic CROME, RN Outcome: Progressing Goal: Cardiovascular complication will be avoided 09/13/2024 0433 by Jori Roderic CROME, RN Outcome: Progressing 09/13/2024 0428 by Jori Roderic CROME, RN Outcome: Progressing   Problem: Activity: Goal: Risk for activity intolerance will decrease 09/13/2024 0433 by Jori Roderic CROME, RN Outcome: Progressing 09/13/2024 0428 by Jori Roderic CROME, RN Outcome: Progressing   Problem: Nutrition: Goal: Adequate nutrition will be maintained 09/13/2024 0433 by Jori Roderic CROME, RN Outcome: Progressing 09/13/2024 0428 by Jori Roderic CROME, RN Outcome: Progressing   Problem:  Coping: Goal: Level of anxiety will decrease 09/13/2024 0433 by Jori Roderic CROME, RN Outcome: Progressing 09/13/2024 0428 by Jori Roderic CROME, RN Outcome: Progressing   Problem: Elimination: Goal: Will not experience complications related to bowel motility 09/13/2024 0433 by Jori Roderic CROME, RN Outcome: Progressing 09/13/2024 0428 by Jori Roderic CROME, RN Outcome: Progressing Goal: Will not experience complications related to urinary retention 09/13/2024 0433 by Jori Roderic CROME, RN Outcome: Progressing 09/13/2024 0428 by Jori Roderic CROME, RN Outcome: Progressing   Problem: Pain Managment: Goal: General experience of comfort will improve and/or be controlled 09/13/2024 0433 by Jori Roderic CROME, RN Outcome: Progressing 09/13/2024 0428 by Jori Roderic CROME, RN Outcome: Progressing   Problem: Safety: Goal: Ability to remain free from injury will improve 09/13/2024 0433 by Jori Roderic CROME, RN Outcome: Progressing 09/13/2024 0428 by Jori Roderic CROME, RN Outcome: Progressing   Problem: Skin Integrity: Goal: Risk for impaired skin integrity will decrease 09/13/2024 0433 by Jori Roderic CROME, RN Outcome: Progressing 09/13/2024 0428 by Jori Roderic CROME, RN Outcome: Progressing   Problem: Nutrition Goal: Patient maintains adequate hydration 09/13/2024 0433 by Jori Roderic CROME, RN Outcome: Progressing 09/13/2024 0428 by Jori Roderic CROME, RN Outcome: Progressing Goal: Patient maintains weight 09/13/2024 0433 by Jori Roderic CROME, RN Outcome: Progressing 09/13/2024 0428 by Jori Roderic CROME, RN Outcome: Progressing Goal: Patient/Family demonstrates understanding of diet 09/13/2024 0433 by Jori Roderic CROME, RN Outcome: Progressing 09/13/2024 0428 by Jori Roderic CROME, RN Outcome: Progressing Goal: Patient/Family independently completes tube feeding 09/13/2024 0433 by Jori Roderic CROME, RN Outcome: Progressing 09/13/2024  0428 by Jori Roderic CROME, RN Outcome: Progressing Goal: Patient will have no more than 5 lb weight change during LOS 09/13/2024 0433 by Jori Roderic CROME, RN Outcome: Progressing 09/13/2024 0428 by Jori Roderic CROME, RN Outcome: Progressing Goal: Patient will utilize adaptive techniques to administer nutrition 09/13/2024 0433 by Jori Roderic CROME, RN Outcome: Progressing 09/13/2024 0428 by Jori Roderic CROME, RN Outcome: Progressing Goal: Patient will verbalize dietary  restrictions 09/13/2024 0433 by Jori Roderic CROME, RN Outcome: Progressing 09/13/2024 0428 by Jori Roderic CROME, RN Outcome: Progressing   Problem: Education: Goal: Ability to describe self-care measures that may prevent or decrease complications (Diabetes Survival Skills Education) will improve 09/13/2024 0433 by Jori Roderic CROME, RN Outcome: Progressing 09/13/2024 0428 by Jori Roderic CROME, RN Outcome: Progressing Goal: Individualized Educational Video(s) 09/13/2024 0433 by Jori Roderic CROME, RN Outcome: Progressing 09/13/2024 0428 by Jori Roderic CROME, RN Outcome: Progressing   Problem: Coping: Goal: Ability to adjust to condition or change in health will improve 09/13/2024 0433 by Jori Roderic CROME, RN Outcome: Progressing 09/13/2024 0428 by Jori Roderic CROME, RN Outcome: Progressing   Problem: Fluid Volume: Goal: Ability to maintain a balanced intake and output will improve 09/13/2024 0433 by Jori Roderic CROME, RN Outcome: Progressing 09/13/2024 0428 by Jori Roderic CROME, RN Outcome: Progressing   Problem: Health Behavior/Discharge Planning: Goal: Ability to identify and utilize available resources and services will improve 09/13/2024 0433 by Jori Roderic CROME, RN Outcome: Progressing 09/13/2024 0428 by Jori Roderic CROME, RN Outcome: Progressing Goal: Ability to manage health-related needs will improve 09/13/2024 0433 by Jori Roderic CROME, RN Outcome:  Progressing 09/13/2024 0428 by Jori Roderic CROME, RN Outcome: Progressing   Problem: Metabolic: Goal: Ability to maintain appropriate glucose levels will improve 09/13/2024 0433 by Jori Roderic CROME, RN Outcome: Progressing 09/13/2024 0428 by Jori Roderic CROME, RN Outcome: Progressing   Problem: Nutritional: Goal: Maintenance of adequate nutrition will improve 09/13/2024 0433 by Jori Roderic CROME, RN Outcome: Progressing 09/13/2024 0428 by Jori Roderic CROME, RN Outcome: Progressing Goal: Progress toward achieving an optimal weight will improve 09/13/2024 0433 by Jori Roderic CROME, RN Outcome: Progressing 09/13/2024 0428 by Jori Roderic CROME, RN Outcome: Progressing   Problem: Skin Integrity: Goal: Risk for impaired skin integrity will decrease 09/13/2024 0433 by Jori Roderic CROME, RN Outcome: Progressing 09/13/2024 0428 by Jori Roderic CROME, RN Outcome: Progressing   Problem: Tissue Perfusion: Goal: Adequacy of tissue perfusion will improve 09/13/2024 0433 by Jori Roderic CROME, RN Outcome: Progressing 09/13/2024 0428 by Jori Roderic CROME, RN Outcome: Progressing   Problem: Education: Goal: Knowledge of disease or condition will improve 09/13/2024 0433 by Jori Roderic CROME, RN Outcome: Progressing 09/13/2024 0428 by Jori Roderic CROME, RN Outcome: Progressing Goal: Knowledge of secondary prevention will improve (MUST DOCUMENT ALL) 09/13/2024 0433 by Jori Roderic CROME, RN Outcome: Progressing 09/13/2024 0428 by Jori Roderic CROME, RN Outcome: Progressing Goal: Knowledge of patient specific risk factors will improve (DELETE if not current risk factor) 09/13/2024 0433 by Jori Roderic CROME, RN Outcome: Progressing 09/13/2024 0428 by Jori Roderic CROME, RN Outcome: Progressing   Problem: Ischemic Stroke/TIA Tissue Perfusion: Goal: Complications of ischemic stroke/TIA will be minimized 09/13/2024 0433 by Jori Roderic CROME, RN Outcome:  Progressing 09/13/2024 0428 by Jori Roderic CROME, RN Outcome: Progressing   Problem: Coping: Goal: Will verbalize positive feelings about self 09/13/2024 0433 by Jori Roderic CROME, RN Outcome: Progressing 09/13/2024 0428 by Jori Roderic CROME, RN Outcome: Progressing Goal: Will identify appropriate support needs 09/13/2024 0433 by Jori Roderic CROME, RN Outcome: Progressing 09/13/2024 0428 by Jori Roderic CROME, RN Outcome: Progressing   Problem: Health Behavior/Discharge Planning: Goal: Ability to manage health-related needs will improve 09/13/2024 0433 by Jori Roderic CROME, RN Outcome: Progressing 09/13/2024 0428 by Jori Roderic CROME, RN Outcome: Progressing Goal: Goals will be collaboratively established with patient/family 09/13/2024 0433 by Jori Roderic CROME, RN Outcome: Progressing 09/13/2024 0428 by Jori Roderic CROME, RN Outcome: Progressing   Problem: Self-Care:  Goal: Ability to participate in self-care as condition permits will improve 09/13/2024 0433 by Jori Roderic CROME, RN Outcome: Progressing 09/13/2024 0428 by Jori Roderic CROME, RN Outcome: Progressing Goal: Verbalization of feelings and concerns over difficulty with self-care will improve 09/13/2024 0433 by Jori Roderic CROME, RN Outcome: Progressing 09/13/2024 0428 by Jori Roderic CROME, RN Outcome: Progressing Goal: Ability to communicate needs accurately will improve 09/13/2024 0433 by Jori Roderic CROME, RN Outcome: Progressing 09/13/2024 0428 by Jori Roderic CROME, RN Outcome: Progressing   Problem: Nutrition: Goal: Risk of aspiration will decrease 09/13/2024 0433 by Jori Roderic CROME, RN Outcome: Progressing 09/13/2024 0428 by Jori Roderic CROME, RN Outcome: Progressing Goal: Dietary intake will improve 09/13/2024 0433 by Jori Roderic CROME, RN Outcome: Progressing 09/13/2024 0428 by Jori Roderic CROME, RN Outcome: Progressing

## 2024-09-13 NOTE — Progress Notes (Signed)
 16 Fr. Coude catheter with 10 cc syringe bulb placed under sterile procedure with clear yellow urine return. Pt tolerated well. No adverse effect.  Durwood Garland  RN

## 2024-09-13 NOTE — Progress Notes (Addendum)
 ID Pharmacist Note   Discussed patient with Dr. Luiz from ID this morning. Given doubling of SCr, will change IV acyclovir to PO valacyclovir 1 gm daily to complete 7 days of therapy.   Addendum 11/5 PM: Noted patient had a code stroke this morning with discovery of a new subarachnoid hemorrhage. He is not currently taking anything by mouth. Updated Dr. Luiz and will hold further VZV therapy.    Damien Quiet, PharmD, BCPS, BCIDP Infectious Diseases Clinical Pharmacist Phone: [phone] 09/13/2024 8:47 AM

## 2024-09-14 DIAGNOSIS — B020 Zoster encephalitis: Secondary | ICD-10-CM

## 2024-09-14 DIAGNOSIS — I7789 Other specified disorders of arteries and arterioles: Secondary | ICD-10-CM

## 2024-09-14 DIAGNOSIS — R627 Adult failure to thrive: Secondary | ICD-10-CM | POA: Diagnosis not present

## 2024-09-14 DIAGNOSIS — I6381 Other cerebral infarction due to occlusion or stenosis of small artery: Secondary | ICD-10-CM

## 2024-09-14 LAB — CBC WITH DIFFERENTIAL/PLATELET
Abs Immature Granulocytes: 0.05 K/uL (ref 0.00–0.07)
Basophils Absolute: 0 K/uL (ref 0.0–0.1)
Basophils Relative: 0 %
Eosinophils Absolute: 0 K/uL (ref 0.0–0.5)
Eosinophils Relative: 0 %
HCT: 36.8 % — ABNORMAL LOW (ref 39.0–52.0)
Hemoglobin: 13.1 g/dL (ref 13.0–17.0)
Immature Granulocytes: 0 %
Lymphocytes Relative: 7 %
Lymphs Abs: 0.8 K/uL (ref 0.7–4.0)
MCH: 30.3 pg (ref 26.0–34.0)
MCHC: 35.6 g/dL (ref 30.0–36.0)
MCV: 85 fL (ref 80.0–100.0)
Monocytes Absolute: 1 K/uL (ref 0.1–1.0)
Monocytes Relative: 8 %
Neutro Abs: 9.8 K/uL — ABNORMAL HIGH (ref 1.7–7.7)
Neutrophils Relative %: 85 %
Platelets: 198 K/uL (ref 150–400)
RBC: 4.33 MIL/uL (ref 4.22–5.81)
RDW: 17 % — ABNORMAL HIGH (ref 11.5–15.5)
WBC: 11.6 K/uL — ABNORMAL HIGH (ref 4.0–10.5)
nRBC: 0 % (ref 0.0–0.2)

## 2024-09-14 LAB — VARICELLA-ZOSTER BY PCR: Varicella-Zoster, PCR: POSITIVE — AB

## 2024-09-14 LAB — MISC LABCORP TEST (SEND OUT): Labcorp test code: 505500

## 2024-09-14 LAB — COMPREHENSIVE METABOLIC PANEL WITH GFR
ALT: 14 U/L (ref 0–44)
AST: 19 U/L (ref 15–41)
Albumin: 2.4 g/dL — ABNORMAL LOW (ref 3.5–5.0)
Alkaline Phosphatase: 56 U/L (ref 38–126)
Anion gap: 9 (ref 5–15)
BUN: 24 mg/dL — ABNORMAL HIGH (ref 8–23)
CO2: 21 mmol/L — ABNORMAL LOW (ref 22–32)
Calcium: 7.8 mg/dL — ABNORMAL LOW (ref 8.9–10.3)
Chloride: 99 mmol/L (ref 98–111)
Creatinine, Ser: 2.07 mg/dL — ABNORMAL HIGH (ref 0.61–1.24)
GFR, Estimated: 32 mL/min — ABNORMAL LOW (ref >60)
Glucose, Bld: 134 mg/dL — ABNORMAL HIGH (ref 70–99)
Potassium: 4 mmol/L (ref 3.5–5.1)
Sodium: 129 mmol/L — ABNORMAL LOW (ref 135–145)
Total Bilirubin: 1 mg/dL (ref 0.0–1.2)
Total Protein: 4.6 g/dL — ABNORMAL LOW (ref 6.5–8.1)

## 2024-09-14 LAB — VZV PCR, CSF: VZV PCR, CSF: NEGATIVE

## 2024-09-14 LAB — PROCALCITONIN: Procalcitonin: <0.1 ng/mL

## 2024-09-14 LAB — MAGNESIUM: Magnesium: 2.2 mg/dL (ref 1.7–2.4)

## 2024-09-14 MED ORDER — THIAMINE MONONITRATE 100 MG PO TABS
100.0000 mg | ORAL_TABLET | Freq: Every day | ORAL | Status: DC
  Administered 2024-09-14 – 2024-09-19 (×5): 100 mg via ORAL
  Filled 2024-09-14 (×6): qty 1

## 2024-09-14 MED ORDER — SODIUM CHLORIDE 0.9 % IV SOLN
1000.0000 mg | INTRAVENOUS | Status: AC
  Administered 2024-09-14 – 2024-09-18 (×5): 1000 mg via INTRAVENOUS
  Filled 2024-09-14 (×2): qty 16
  Filled 2024-09-14: qty 8
  Filled 2024-09-14 (×2): qty 16

## 2024-09-14 MED ORDER — KATE FARMS STANDARD 1.4 EN LIQD
325.0000 mL | Freq: Two times a day (BID) | ENTERAL | Status: DC
  Administered 2024-09-14 – 2024-09-20 (×6): 325 mL via ORAL
  Filled 2024-09-14 (×16): qty 325

## 2024-09-14 MED ORDER — PREDNISONE 5 MG PO TABS
50.0000 mg | ORAL_TABLET | Freq: Every day | ORAL | Status: DC
  Administered 2024-09-19: 50 mg via ORAL
  Filled 2024-09-14: qty 2

## 2024-09-14 NOTE — Progress Notes (Signed)
 Daily Progress Note   Patient Name: Noah Burnett       Date: 09/14/2024 DOB: 30-Jun-1941  Age: 83 y.o. MRN#: [id number] Attending Physician: Vernon Ranks, MD Primary Care Physician: [name], MD Admit Date: 09/08/2024 Length of Stay: 6 days  Reason for Consultation/Follow-up: Establishing goals of care  Subjective:   Subjective: EMR reviewed including recent notes from hospitalist, neurology, infectious disease TOC, CIR, and bedside care team.  Personal review of recent MRI with noted of small subarachnoid hemorrhage.  Labs reviewed with notable sodium 129, creatinine 2.07, albumin 2.4, white blood count 11.6, hemoglobin 13.1.  I met at the bedside with patient and his daughter, [name].  [name] relates that he was having a bad day yesterday and is feeling much better this morning.  She reports that yesterday he was sleepy all day, but this morning he has been awake, has had something to eat, and is now interacting with her on a regular basis.  He is quiet and does not fully participate in conversation, but he does answer simple yes or no questions such as review of systems appropriately, albeit very slowly.  Liaison from CIR came to check in on him and reports that he is looking much more interactive today than last time he was evaluated.  [name] reports that she is very much interested in pursuing CIR and would like to appeal his insurance denial.  We reviewed the multiple comorbidities he has and she reports that she thinks he still has the opportunity to improve enough to return to his prior home.  States family is invested in working to get him home even if it means he needs 24-hour in-home care.  Attempted to explore thoughts if he does not continue to improve, but [name] reports family is not going to consider this at this time as they are going to focus on the positive since he is having a better day today.  Review of Systems Denies all review of systems but poor  historian  Objective:   Vital Signs:  BP (!) 143/68 (BP Location: Left Arm)   Pulse (!) 54   Temp 98.6 F (37 C) (Oral)   Resp 20   Ht 5' 7 (1.702 m)   Wt 45.6 kg   SpO2 100%   BMI 15.75 kg/m   Physical Exam: General: Elderly male sitting in bedside chair Eyes: conjunctiva clear, anicteric sclera HENT: normocephalic, atraumatic, moist mucous membranes Cardiovascular: RRR, no edema in LE b/l Respiratory: no increased work of breathing noted, not in respiratory distress Abdomen: not distended Skin: no rashes or lesions on visible skin Neuro: Alert, slow to answer questions but will answer yes or no questions  Imaging: @IMAGES @  I personally reviewed recent imaging.   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  She reports he is having a better day today and family is going to focus on the positive at this time.  They are not interested in further discussion about potential decline, limitations of care, or having a family meeting today.  Discussed plan for palliative care to continue to follow and progress conversation based upon his clinical course.  -  Code Status: Full Code  Prognosis: Unable to determine  # Psychosocial Support:  - Family including 2 sons and daughter  # Discharge Planning: To Be Determined-family appealing CIR denial  -  Discussed with: Patient, daughter  Thank you for allowing the palliative care team to participate in the care Noah Burnett.  Amaryllis Meissner, MD Palliative Care Provider PMT # [phone]  If patient remains symptomatic despite maximum doses, please call PMT at [phone] between 0700 and 1900. Outside of these hours, please call attending, as PMT does not have night coverage.   I personally spent a total of 55 minutes in the care of the patient today including  preparing to see the patient, getting/reviewing separately obtained history, performing a medically appropriate exam/evaluation, counseling and educating, referring and communicating with other health care professionals, documenting clinical information in the EHR, and coordinating care.

## 2024-09-14 NOTE — Plan of Care (Signed)
  Problem: Nutrition: Goal: Risk of aspiration will decrease Outcome: Progressing Goal: Dietary intake will improve Outcome: Progressing   Problem: Education: Goal: Knowledge of disease or condition will improve Outcome: Progressing Goal: Knowledge of secondary prevention will improve (MUST DOCUMENT ALL) Outcome: Progressing Goal: Knowledge of patient specific risk factors will improve (DELETE if not current risk factor) Outcome: Progressing

## 2024-09-14 NOTE — Progress Notes (Signed)
 Speech Language Pathology Treatment: Dysphagia;Cognitive-Linguistic  Patient Details Name: Noah Burnett MRN: [id number] DOB: 1941/06/09 Today's Date: 09/14/2024 Time: 9082-9061 SLP Time Calculation (min) (ACUTE ONLY): 21 min  Assessment / Plan / Recommendation Clinical Impression  Pt alert today with daughter at bedside. Pt appears to have difficulty protecting airway with larger pills given via RN with water resulting in immediate cough. RN crushed subsequent pills that were larger and pt able to consume without difficulty. Straw sips thin throughout session were without s/s aspiration. Daughter reported he masticated pancakes without difficulty but some trouble masticating cut up sausage. He was able to masticate cracker timely without residue. Daughter wishes to remain on Dys 3 diet versus downgrade and can order softer items. Continue thin and crush large pills other pills whole in puree.  Pt exhibits delayed processing with significant dysarthria- reviewed speech strategies and pt able to increase intelligibility when using. SLP suspects impairments may be impacted from dysarthria more than language however will continue to assess in diagnostic therapy. Daughter states speech is slurred but the words are accurate. He does have delays and hesitations but able to state activities he does at home with appropriate language (needed cues to use full sentences) with 90% intelligibility. Followed a 3 step command 100%, answered biographical questions accurately with hesitancy on where he lives and stated 3 items in category with additional time (dysnomia vs processing?). Named common object accurately. Daughter educated on strategies she can use with pt to facilitate speech and communication. ST will continue and assess language further in diagnostic tx.    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 MRI 11/1 concerning for enhancing and non-enhancing lesions with broad differential. Decreased verbalizations, increased R sided weakness, and R sided neglect observed 11/5, code stroke called. MRI 11/5 shows small volume L parietal SAH and patchy cortical and subcortical signal abnormality throughout both cerebral hemispheres, overall slightly progressed from 11/1 and indeterminate although encephalitis and vasculitis remains leading considerations.      SLP Plan  Continue with current plan of care          Recommendations  Diet recommendations: Dysphagia 3 (mechanical soft);Thin liquid Liquids provided via: Straw Medication Administration: Other (Comment) (crush large, small whole in applesauce) Supervision:  (uncertain) Compensations: Slow rate;Small sips/bites Postural Changes and/or Swallow Maneuvers: Seated upright 90 degrees                Rehab consult Oral care BID   PRN Dysphagia, unspecified (R13.10);Dysarthria and anarthria (R47.1);Cognitive communication deficit (M58.158)     Continue with current plan of care     Dustin Olam Bull  09/14/2024, 9:46 AM

## 2024-09-14 NOTE — Progress Notes (Signed)
 [facility]    Date of Admission:  09/08/2024   Total days of antibiotics 5   ID: Noah Burnett is a 83 y.o. male with   Principal Problem:   Failure to thrive in adult Active Problems:   Essential hypertension   Gastric outlet obstruction   H/O bypass gastrojejunostomy   Hyponatremia   Prediabetes   Protein-calorie malnutrition, severe    Subjective: had repeat imaging yesterday showing SAH and more vasculitis appearance in repeat brain mri Still having poor appetite. Wife reports him being more alert.  Medications:   Chlorhexidine Gluconate Cloth  6 each Topical Daily   enoxaparin (LOVENOX) injection  30 mg Subcutaneous Q24H   feeding supplement (KATE FARMS STANDARD ENT 1.4)  325 mL Oral BID BM   lidocaine   1 Application Urethral Once   multivitamin with minerals  1 tablet Oral Daily   pantoprazole   40 mg Oral BID   polyethylene glycol  17 g Oral Daily   rosuvastatin  10 mg Oral Daily   senna  2 tablet Oral Daily   sodium chloride   1 g Oral TID WC   thiamine  100 mg Oral Daily   vitamin B-12  500 mcg Oral Daily    Objective: Vital signs in last 24 hours: Temp:  [97.4 F (36.3 C)-98.6 F (37 C)] 98.6 F (37 C) (11/06 1520) Pulse Rate:  [50-73] 54 (11/06 1520) Resp:  [15-20] 20 (11/06 1520) BP: (126-147)/(67-71) 143/68 (11/06 1520) SpO2:  [96 %-100 %] 100 % (11/06 1520)  Physical Exam  Constitutional: He is oriented to person, place, and time. He appears frail and under-nourished. No distress.  HENT:  Mouth/Throat: Oropharynx is clear and moist. No oropharyngeal exudate.  Cardiovascular: Normal rate, regular rhythm and normal heart sounds. Exam reveals no gallop and no friction rub.  No murmur heard.  Pulmonary/Chest: Effort normal and breath sounds normal. No respiratory distress. He has no wheezes.  Abdominal: Soft. Bowel sounds are normal. He exhibits no distension. There is no tenderness.  Lymphadenopathy:  He has no  cervical adenopathy.  Neurological: He is alert and oriented to person, place, and time.  Skin: Skin is warm and dry. No rash noted. No erythema.  Psychiatric: He has a normal mood and affect. His behavior is normal.    Lab Results Recent Labs    09/13/24 0155 09/14/24 0244  WBC 13.7* 11.6*  HGB 15.4 13.1  HCT 43.7 36.8*  NA 128* 129*  K 4.1 4.0  CL 93* 99  CO2 23 21*  BUN 17 24*  CREATININE 1.61* 2.07*   Liver Panel Recent Labs    09/13/24 0155 09/14/24 0244  PROT 5.3* 4.6*  ALBUMIN 3.0* 2.4*  AST 23 19  ALT 12 14  ALKPHOS 63 56  BILITOT 1.0 1.0   Sedimentation Rate No results for input(s): ESRSEDRATE in the last 72 hours. C-Reactive Protein Recent Labs    09/12/24 0158 09/13/24 0155  CRP 3.8* 4.9*    Microbiology: + CSF VZV Ig G Studies/Results: MR BRAIN WO CONTRAST Result Date: 09/13/2024 EXAM: MRI BRAIN WITHOUT CONTRAST 09/13/2024 04:16:00 PM TECHNIQUE: Multiplanar multisequence MRI of the head/brain was performed without the administration of intravenous contrast. COMPARISON: Head CT and CTA 09/13/2024 and MRI 09/09/2024. CLINICAL HISTORY: Neuro deficit, acute, stroke suspected. FINDINGS: The examination is intermittently up to moderately motion degraded. BRAIN AND VENTRICLES: Numerous patchy areas of cortical and subcortical T2 hyperintensity and diffusion weighted signal hyperintensity are again seen scattered  throughout both cerebral hemispheres. Some areas appear mildly progressive on diffusion weighted imaging, such as small regions of restricted diffusion involving cortex and subcortical white matter in the posterior left frontal lobe and left corona radiata, while the signal changes on T2 and FLAIR sequences are stable to at most minimally increased. Gyral intrinsic T1 hyperintensity is again noted along the right parietooccipital sulcus. A small amount of susceptibility within the left postcentral sulcus corresponds to subarachnoid hemorrhage which is new  from the prior MRI but was seen on today's earlier CT. There is mild to moderate cerebral atrophy. Background chronic small vessel ischemia is noted in the cerebral white matter. No midline shift, hydrocephalus, or extra axial fluid collection is identified. Major intracranial vascular flow voids are preserved. ORBITS: Bilateral cataract extraction. SINUSES AND MASTOIDS: No acute abnormality. BONES AND SOFT TISSUES: Normal marrow signal. No acute soft tissue abnormality. IMPRESSION: 1. Prominent patchy cortical and subcortical signal abnormality throughout both cerebral hemispheres, overall slightly progressed from 09/09/2024 and indeterminate although encephalitis and vasculitis remain leading considerations. 2. Small volume left parietal subarachnoid hemorrhage. Electronically signed by: Dasie Hamburg MD 09/13/2024 04:35 PM EST RP Workstation: [id number]   EEG adult Result Date: 09/13/2024 Shelton Arlin KIDD, MD     09/13/2024  2:58 PM Patient Name: Noah Burnett MRN: [id number] Epilepsy Attending: Arlin KIDD Shelton Referring Physician/Provider: Remi Pippin, NP Date: 09/13/2024 Duration: 24.05 mins Patient history: 83 year old male with altered mental status.  EEG evaluate for seizure. Level of alertness: Awake AEDs during EEG study: None Technical aspects: This EEG study was done with scalp electrodes positioned according to the 10-20 International system of electrode placement. Electrical activity was reviewed with band pass filter of 1-70Hz , sensitivity of 7 uV/mm, display speed of 76mm/sec with a 60Hz  notched filter applied as appropriate. EEG data were recorded continuously and digitally stored.  Video monitoring was available and reviewed as appropriate. Description: EEG showed continuous generalized 3 to 6 Hz theta-delta slowing admixed with 12-15hz  beta activity. Hyperventilation and photic stimulation were not performed.   ABNORMALITY - Continuous slow, generalized IMPRESSION: This study is suggestive of  generalized cerebral dysfunction (encephalopathy). No seizures or epileptiform discharges were seen throughout the recording. Priyanka O Yadav   CT ANGIO HEAD NECK W WO CM (CODE STROKE) Result Date: 09/13/2024 EXAM: CTA HEAD AND NECK WITHOUT AND WITH 09/13/2024 10:29:28 AM TECHNIQUE: CTA of the head and neck was performed without and with the administration of 75 mL of iohexol  (OMNIPAQUE ) 350 MG/ML injection. Multiplanar 2D and/or 3D reformatted images are provided for review. Automated exposure control, iterative reconstruction, and/or weight based adjustment of the mA/kV was utilized to reduce the radiation dose to as low as reasonably achievable. Stenosis of the internal carotid arteries measured using NASCET criteria. COMPARISON: Code stroke head CT 09/13/2024 (reported separately), recent CTA head and neck 09/10/2024. CLINICAL HISTORY: 83 year old male with acute neuro deficit, stroke suspected. FINDINGS: CTA NECK: AORTIC ARCH AND ARCH VESSELS: Tortuous aortic arch with mild forage calcified atherosclerosis. 3 vessel arch configuration. No dissection or arterial injury. No significant stenosis of the brachiocephalic or subclavian arteries. Proximal subclavian arteries are stable with tortuosity, no stenosis. CERVICAL CAROTID ARTERIES: Right carotid artery remains patent to the skull base with stable atherosclerosis at the right ICA origin less than 50% stenosis. Left carotid artery remains patent to the skull base with stable atherosclerosis at the left ICA origin and bulb, narrowing the vessel to 1.9 mm versus 5 mm normally, resulting in 62% stenosis. No dissection  or arterial injury. CERVICAL VERTEBRAL ARTERIES: Dominant left vertebral artery is patent and normal to the skull base. Nondominant right vertebral artery is patent, with stable mild stenosis at its origin. Stable mild irregularity and stenosis of the dominant left vertebral artery as it crosses the dura, mild left V4 irregularity. Patent left  PICA origin. The left vertebral primarily supplies the basilar artery as before. Distal right vertebral artery is stable with mild irregularity and stenosis, patent right PICA origin. No dissection or arterial injury. LUNGS AND MEDIASTINUM: Centrilobular emphysema. Negative visible superior mediastinum. SOFT TISSUES: Negative nonvascular neck soft tissue spaces. No acute abnormality. BONES: Absent dentition. Exaggerated cervical lordosis and upper thoracic kyphosis. No acute abnormality. CTA HEAD: ANTERIOR CIRCULATION: Bilateral ICA siphons remain patent. Left siphon moderate calcified plaque and small supraclinoid left ICA 2 to 3 mm aneurysm (series 15 image 109) are stable. Only mild left siphon stenosis. Right ICA siphon moderate calcified plaque and mild to moderate supraclinoid right ICA stenosis are stable. MCA and ACA origins remain patent. Nondominant left A1 segment redemonstrated. Dominant left ACA A2 segment redemonstrated. Bilateral ACA branches are stable. Left MCA M1 segment and bifurcation remain patent without stenosis. Right MCA M1 segment and bifurcation remain patent without stenosis. Bilateral MCA branches are stable. No other aneurysm identified in the anterior circulation. POSTERIOR CIRCULATION: Basilar artery remains patent. SCA and PCA origins are patent. Bilateral PCA branches remain patent, mild to moderate left P2 segment irregularity does not appear significantly changed. No other aneurysm identified in the posterior circulation. OTHER: Early venous contrast timing on this exam, major dural venous sinuses appear grossly patent. Preliminary Report of this exam discussed by telephone with Neurology provider [facility] at 1035 hours. IMPRESSION: 1. Stable CTA head and neck since 09/10/24 with no large vessel occlusion. 2. Stable atherosclerosis at the left ICA origin and bulb with 62% stenosis. 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 And stable less pronounced atherosclerosis elsewhere.  3. Emphysema. Electronically signed by: Helayne Hurst MD 09/13/2024 10:45 AM EST RP Workstation: [id number]   CT HEAD CODE STROKE WO CONTRAST Result Date: 09/13/2024 EXAM: CT HEAD WITHOUT CONTRAST 09/13/2024 10:14:31 AM TECHNIQUE: CT of the head was performed without the administration of intravenous contrast. Automated exposure control, iterative reconstruction, and/or weight based adjustment of the mA/kV was utilized to reduce the radiation dose to as low as reasonably achievable. COMPARISON: Brain MRI 09/09/2024, Head CT 09/10/2024. CLINICAL HISTORY: 83 year old male with acute neuro deficit, stroke suspected. FINDINGS: BRAIN AND VENTRICLES: Trace new left superior convexity subarachnoid blood, 1 to 2 sulci affected and best seen on series 6 image 39 sagittal images. No progressive edema there, no mass effect. No other acute intracranial hemorrhage. No convincing cortically based infarct by CT. No hydrocephalus. No extra-axial collection. No mass effect or midline shift. Stable brain volume. Patchy and confluent, mild to moderate periventricular white matter hypodensity has not significantly changed. Asymmetric right superior temporal gyrus white matter hypodensity also stable to recent exams. Calcified atherosclerosis. No suspicious intracranial vascular hyperdensity. ORBITS: No acute abnormality. Mild leftward gaze. SINUSES: Paranasal sinuses, middle ears and mastoids remain well aerated. SOFT TISSUES AND SKULL: No acute soft tissue abnormality. No skull fracture. [place] stroke program early CT score (aspects) ----- Ganglionic (caudate, ic, lentiform nucleus, insula, M1-m3): 7 Supraganglionic (m4-m6): 3 Total: 10 IMPRESSION: 1. Trace new left superior convexity subarachnoid hemorrhage (12 sulci), without mass effect. 2. Otherwise stable non contrast CT appearance of the brain: Nonspecific white matter changes, No convincing cortically  based infarct by CT. ASPECTS 10. 3. These results were communicated to Dr.  Lindzen at 10:23 hours on 09/13/2024 by text page via the [facility] messaging system. 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 Discussed with neurology and agree that there is benefit for use of steroids to reduce inflammation  Intense drug monitoring = continue to closely monitor cr, and dose adjust acyclovir. 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   I personally spent a total of 50 minutes in the care of the patient today including preparing to see the patient, getting/reviewing separately obtained history, performing a medically appropriate exam/evaluation, counseling and educating, placing orders, referring and communicating with other health care professionals, documenting clinical information in the EHR, independently interpreting results, communicating results, and coordinating care.  [facility] Pager: [phone]  09/14/2024, 3:31 PM

## 2024-09-14 NOTE — Progress Notes (Signed)
 Inpatient Rehab Admissions Coordinator:    Appeal for CIR pending, Pt. Much more alert today. Will continue to follow for potential admit pending appeal.  Leita Kleine, MS, CCC-SLP Rehab Admissions Coordinator  [phone] (celll) [phone] (office)

## 2024-09-14 NOTE — Progress Notes (Signed)
 PROGRESS NOTE    Noah Burnett  FMW:[id number] DOB: 1941/09/26 DOA: 09/08/2024 PCP: [name], MD   Brief Narrative:  83 year old male with a history of prostate cancer (on observation), tobacco abuse, hypertension, GERD, duodenal mass resulting in gastric L obstruction status post robotic gastrojejunostomy 07/03/2024 presenting with decreased oral intake, generalized weakness, and confusion.   Patient had hospital admission at [facility] from 8/18 to 07/09/24 when he presented with nausea and vomiting.  He was found to have gastric outlet obstruction and duodenal mass. He had an EGD that showed an hiatal hernia and compression of the second portion of the duodenum with a possible intraluminal neoplasm. Biopsies were obtained which showed severely inflamed and ulcerated duodenal mucosa, but no evidence of malignancy. A second EGD was done with ultrasound guidance, but again pathology of the lesion extensive necrosis. Surgical oncology was consulted and he underwent a robotic gastrojejunostomy on 8/25. He did have a delayed return of bowel function and had a PICC line with TPN for several days until he had advancement of his diet. Since his hospitalization, he has had a fairly steady functional decline with decreasing appetite, significant weight loss, and generalized weakness.  After discharge, the patient was tolerating a diet and still driving and walking independently.   Unfortunately, the patient developed shingles in the last week of September 2025.  He went to see ophthalmology, and he was cleared from their standpoint.  He finished a course of Valtrex.  He continues to have some postherpetic neuralgia.  Daughter states that since his shingles episode, the patient has had a functional decline with decreasing appetite, significant weight loss, and generalized weakness.   The patient has followed up with surgical oncology on 08/31/2024.  At that time the patient had reported generalized  weakness and fatigue, but he did not have any vomiting or abdominal pain at that time.  He had poor p.o. intake and loss of appetite at that time.  Repeat MRI of the abdomen and pelvis was obtained.  This was performed on 09/06/2024 which showed no specific MR findings to suggest a duodenal mass.  There was Generalized edema-like signal in the retroperitoneum is presumed reactive in the setting of known duodenal ulceration.  There were multiple benign appearing hepatic lesions presumed to be cysts or hemangioma.  There was Bosniak 2 renal cysts.       The patient has not complained of any specific issues.  There has been no fevers, chills, headache, chest pain, shortness breath, nausea, vomiting, diarrhea, abdominal pain.  In fact he has been struggling with constipation. Daughter states that in the past week he has had increasing confusion and generalized weakness with continued decreased oral intake.   UA was negative for pyuria.  CT of the brain was negative for acute findings.  Acute abdominal series was negative for obstruction or pneumoperitoneum.   Neurology consulted for evaluation of acute encephalopathy. Family at bedside reports all his symptoms, generalized weakness, confusion,  dysarthria, lack of appetite started after he was diagnosed with shingles.  His hospital course complicated by acute on chronic hyponatremia. Nephrology consulted and he was started on salt tablets.    Therapy evaluations recommending CIR.     Assessment & Plan:   Principal Problem:   Failure to thrive in adult Active Problems:   Essential hypertension   Gastric outlet obstruction   H/O bypass gastrojejunostomy   Hyponatremia   Prediabetes   Protein-calorie malnutrition, severe  Failure to thrive/acute metabolic encephalopathy MRI  of the brain shows multiple areas of subcortical enhancement in the parieto-occipital lobe.  Differential include encephalitis versus vasculitis versus demyelinating disease  vs stroke.  Neurology consulted and patient was transferred from [facility] to [facility]. Patient underwent fluoroscopic guided lumbar puncture and fluid sent for analysis for evaluation of meningitis and encephalitis. Procalcitonin is negative, CRP is 4.1.  Echocardiogram shows preserved LVEF, with grade 1 diastolic dysfunction.  EEG done, showed diffuse cerebral dysfunction, no epileptiform discharges seen throughout the recording.  VZVPCR is pending.  Varicella IgG negative.  Other workup negative result. Patient noted to have left gaze preference and decreased responsiveness early morning 09/13/2024, code stroke was called, patient was seen by neurology.  Stat CT head was done which shows left superior convexity subarachnoid hemorrhage without mass effect.  Otherwise no changes from previous CT head.  CT angiogram ruled out large vessel occlusion.  Patient was kept n.p.o., SLP consulted.  Eventually MRI completed which showed Prominent patchy cortical and subcortical signal abnormality throughout both cerebral hemispheres, overall slightly progressed from 09/09/2024 and indeterminate although encephalitis and vasculitis remain leading considerations.  ID recommended continuing acyclovir.  Patient's mental status has improved significantly.  Yesterday he was not talking and was just staring with the left gaze preference.  Today he is trying to talk, has dysarthria, following all commands, tracking and appears to be alert and oriented.  Awaiting neurology to see him and guide further.  Chronic hyponatremia: Possibly from SIADH. Sodium in August, 2025 was 129.  Patient was admitted with a sodium of 126 on 10/21.TSH slightly elevated. Am cortisol 30. Serum osmo is 257, urine sodium is 137, urine osmolality is 490.  Nephrology on board.  Started on salt tablets.  Sodium improving.  AKI: Creatinine jumped from 0.76-1.6 6 yesterday and 2.07 today.  Clinically appears to be dehydrated, remains on IV fluids.   Nephrology on board, will defer to them.   GERD/ Duodenal ulceration, stricture and mass s/p  robotic gastrojejunostomy on 8/25. Continue with PPI   Hypertension: On hydrochlorothiazide at home which is on hold, blood pressure controlled.   Hypokalemia: Resolved.  Hypomagnesemia: Resolved.  Recently diagnosed Shingles On Cyclivert.  GOC: Patient remains full code.  On 09/13/2024, I discussed with the son, he wants the patient to be full code however he told me that the siblings are having detailed conversation about the CODE STATUS and this might change.  Palliative is also following.  Patient carries poor prognosis.  DVT prophylaxis: enoxaparin (LOVENOX) injection 30 mg Start: 09/09/24 2200   Code Status: Full Code  Family Communication: Daughter, sister-in-law and niece present at bedside.  Plan of care discussed with son and he was already aware about the plan by neurology.  Status is: Inpatient Remains inpatient appropriate because: Worsening creatinine, multiple medical issues.   Estimated body mass index is 15.75 kg/m as calculated from the following:   Height as of this encounter: 5' 7 (1.702 m).   Weight as of this encounter: 45.6 kg.    Nutritional Assessment: Body mass index is 15.75 kg/m.SABRA Seen by dietician.  I agree with the assessment and plan as outlined below: Nutrition Status: Nutrition Problem: Severe Malnutrition Etiology: chronic illness Signs/Symptoms: energy intake < or equal to 50% for > or equal to 1 month, severe fat depletion, severe muscle depletion, percent weight loss Percent weight loss: 18 % Interventions: Boost Breeze, Magic cup, MVI  . Skin Assessment: I have examined the patient's skin and I agree with the wound assessment  as performed by the wound care RN as outlined below:    Consultants:  Neurology  Procedures:  As above  Antimicrobials:  Anti-infectives (From admission, onward)    Start     Dose/Rate Route Frequency Ordered  Stop   09/13/24 2200  acyclovir (ZOVIRAX) 455 mg in dextrose  5 % 100 mL IVPB        10 mg/kg  45.6 kg 109.1 mL/hr over 60 Minutes Intravenous Every 24 hours 09/13/24 1923     09/13/24 1800  valACYclovir (VALTREX) tablet 1,000 mg  Status:  Discontinued        1,000 mg Oral Daily after supper 09/13/24 0846 09/13/24 1358   09/09/24 1500  acyclovir (ZOVIRAX) 455 mg in dextrose  5 % 100 mL IVPB  Status:  Discontinued        10 mg/kg  45.6 kg 109.1 mL/hr over 60 Minutes Intravenous Every 12 hours 09/09/24 1331 09/13/24 0846         Subjective: Seen and examined, multiple family members at the bedside.  Patient's mental status is much better today than yesterday.  He is following all commands, tracking easily, does not have left gaze preference, appears to have left facial droop and dysarthria. Objective: Vitals:   09/14/24 0000 09/14/24 0414 09/14/24 0802 09/14/24 1108  BP: (!) 142/70 126/67 (!) 144/67 (!) 147/69  Pulse: 60 (!) 51 65 73  Resp:  18 15 18   Temp: (!) 97.4 F (36.3 C) 97.6 F (36.4 C) 97.9 F (36.6 C) 98.1 F (36.7 C)  TempSrc: Axillary Oral Oral Axillary  SpO2: 99% 98% 96% 100%  Weight:      Height:        Intake/Output Summary (Last 24 hours) at 09/14/2024 1111 Last data filed at 09/14/2024 0600 Gross per 24 hour  Intake 1403.87 ml  Output 2000 ml  Net -596.13 ml   Filed Weights   09/08/24 1138 09/08/24 1626  Weight: 51.2 kg 45.6 kg    Examination:  General exam: Appears calm and comfortable  Respiratory system: Clear to auscultation. Respiratory effort normal. Cardiovascular system: S1 & S2 heard, RRR. No JVD, murmurs, rubs, gallops or clicks. No pedal edema. Gastrointestinal system: Abdomen is nondistended, soft and nontender. No organomegaly or masses felt. Normal bowel sounds heard. Central nervous system: Alert and oriented.  Left facial droop, dysarthria.  No other focal deficit. Extremities: Symmetric 5 x 5 power. Skin: No rashes, lesions or ulcers.     Data Reviewed: I have personally reviewed following labs and imaging studies  CBC: Recent Labs  Lab 09/10/24 0255 09/11/24 0258 09/12/24 0158 09/13/24 0155 09/14/24 0244  WBC 8.5 6.1 11.2* 13.7* 11.6*  NEUTROABS 6.2 4.2 9.6* 12.1* 9.8*  HGB 14.6 13.4 15.5 15.4 13.1  HCT 42.1 37.5* 43.6 43.7 36.8*  MCV 87.3 85.4 84.7 85.7 85.0  PLT 214 183 234 227 198   Basic Metabolic Panel: Recent Labs  Lab 09/10/24 0255 09/11/24 0258 09/11/24 1829 09/12/24 0158 09/13/24 0155 09/14/24 0244  NA 127* 125* 126* 125* 128* 129*  K 4.1 3.6  --  3.0* 4.1 4.0  CL 91* 93*  --  91* 93* 99  CO2 24 21*  --  20* 23 21*  GLUCOSE 95 85  --  145* 147* 134*  BUN 9 12  --  12 17 24*  CREATININE 0.73 0.67  --  0.76 1.61* 2.07*  CALCIUM 8.3* 7.9*  --  8.0* 8.1* 7.8*  MG 1.7 1.8  --  1.7 2.5* 2.2  PHOS 3.6  --   --   --   --   --    GFR: Estimated Creatinine Clearance: 18.1 mL/min (A) (by C-G formula based on SCr of 2.07 mg/dL (H)). Liver Function Tests: Recent Labs  Lab 09/10/24 0255 09/11/24 0258 09/12/24 0158 09/13/24 0155 09/14/24 0244  AST 14* 12* 21 23 19   ALT 11 9 14 12 14   ALKPHOS 65 58 66 63 56  BILITOT 1.1 1.6* 1.4* 1.0 1.0  PROT 5.6* 4.7* 5.6* 5.3* 4.6*  ALBUMIN 3.0* 2.6* 3.0* 3.0* 2.4*   Recent Labs  Lab 09/08/24 1229  LIPASE 27   No results for input(s): AMMONIA in the last 168 hours. Coagulation Profile: Recent Labs  Lab 09/10/24 0255  INR 1.0   Cardiac Enzymes: Recent Labs  Lab 09/10/24 0255  CKTOTAL 30*   BNP (last 3 results) No results for input(s): PROBNP in the last 8760 hours. HbA1C: No results for input(s): HGBA1C in the last 72 hours. CBG: Recent Labs  Lab 09/09/24 1201 09/09/24 1242 09/13/24 1000  GLUCAP 68* 141* 144*   Lipid Profile: No results for input(s): CHOL, HDL, LDLCALC, TRIG, CHOLHDL, LDLDIRECT in the last 72 hours. Thyroid Function Tests: No results for input(s): TSH, T4TOTAL, FREET4, T3FREE,  THYROIDAB in the last 72 hours. Anemia Panel: No results for input(s): VITAMINB12, FOLATE, FERRITIN, TIBC, IRON, RETICCTPCT in the last 72 hours. Sepsis Labs: Recent Labs  Lab 09/11/24 0258 09/12/24 0158 09/13/24 0155 09/14/24 0244  PROCALCITON <0.10 <0.10 0.10 <0.10    Recent Results (from the past 240 hours)  CSF culture w Gram Stain     Status: None   Collection Time: 09/10/24  7:10 AM   Specimen: CSF; Cerebrospinal Fluid  Result Value Ref Range Status   Specimen Description CSF  Final   Special Requests NONE  Final   Gram Stain   Final    WBC PRESENT, PREDOMINANTLY MONONUCLEAR NO ORGANISMS SEEN CYTOSPIN SMEAR    Culture   Final    NO GROWTH 3 DAYS Performed at [facility], [address], [place], [place] [address]    Report Status 09/13/2024 FINAL  Final  Varicella-zoster by PCR     Status: Abnormal   Collection Time: 09/11/24 11:50 AM   Specimen: Blood  Result Value Ref Range Status   Varicella-Zoster, PCR Positive (A) Negative Final    Comment: (NOTE) Varicella Zoster Virus DNA detected. This test was developed and its performance characteristics determined by [facility]. It has not been cleared or approved by the Food and Drug Administration. Performed At: [facility] [address] [place], [place] [id number] Jennette Shorter MD Ey:[phone]      Radiology Studies: MR BRAIN WO CONTRAST Result Date: 09/13/2024 EXAM: MRI BRAIN WITHOUT CONTRAST 09/13/2024 04:16:00 PM TECHNIQUE: Multiplanar multisequence MRI of the head/brain was performed without the administration of intravenous contrast. COMPARISON: Head CT and CTA 09/13/2024 and MRI 09/09/2024. CLINICAL HISTORY: Neuro deficit, acute, stroke suspected. FINDINGS: The examination is intermittently up to moderately motion degraded. BRAIN AND VENTRICLES: Numerous patchy areas of cortical and subcortical T2 hyperintensity and diffusion weighted signal hyperintensity are again seen scattered  throughout both cerebral hemispheres. Some areas appear mildly progressive on diffusion weighted imaging, such as small regions of restricted diffusion involving cortex and subcortical white matter in the posterior left frontal lobe and left corona radiata, while the signal changes on T2 and FLAIR sequences are stable to at most minimally increased. Gyral intrinsic T1 hyperintensity is again noted along the  right parietooccipital sulcus. A small amount of susceptibility within the left postcentral sulcus corresponds to subarachnoid hemorrhage which is new from the prior MRI but was seen on today's earlier CT. There is mild to moderate cerebral atrophy. Background chronic small vessel ischemia is noted in the cerebral white matter. No midline shift, hydrocephalus, or extra axial fluid collection is identified. Major intracranial vascular flow voids are preserved. ORBITS: Bilateral cataract extraction. SINUSES AND MASTOIDS: No acute abnormality. BONES AND SOFT TISSUES: Normal marrow signal. No acute soft tissue abnormality. IMPRESSION: 1. Prominent patchy cortical and subcortical signal abnormality throughout both cerebral hemispheres, overall slightly progressed from 09/09/2024 and indeterminate although encephalitis and vasculitis remain leading considerations. 2. Small volume left parietal subarachnoid hemorrhage. Electronically signed by: Dasie Hamburg MD 09/13/2024 04:35 PM EST RP Workstation: [id number]   EEG adult Result Date: 09/13/2024 Shelton Arlin KIDD, MD     09/13/2024  2:58 PM Patient Name: Noah Burnett MRN: [id number] Epilepsy Attending: Arlin KIDD Shelton Referring Physician/Provider: Remi Pippin, NP Date: 09/13/2024 Duration: 24.05 mins Patient history: 83 year old male with altered mental status.  EEG evaluate for seizure. Level of alertness: Awake AEDs during EEG study: None Technical aspects: This EEG study was done with scalp electrodes positioned according to the 10-20 International system of  electrode placement. Electrical activity was reviewed with band pass filter of 1-70Hz , sensitivity of 7 uV/mm, display speed of 46mm/sec with a 60Hz  notched filter applied as appropriate. EEG data were recorded continuously and digitally stored.  Video monitoring was available and reviewed as appropriate. Description: EEG showed continuous generalized 3 to 6 Hz theta-delta slowing admixed with 12-15hz  beta activity. Hyperventilation and photic stimulation were not performed.   ABNORMALITY - Continuous slow, generalized IMPRESSION: This study is suggestive of generalized cerebral dysfunction (encephalopathy). No seizures or epileptiform discharges were seen throughout the recording. Priyanka KIDD Shelton   CT ANGIO HEAD NECK W WO CM (CODE STROKE) Result Date: 09/13/2024 EXAM: CTA HEAD AND NECK WITHOUT AND WITH 09/13/2024 10:29:28 AM TECHNIQUE: CTA of the head and neck was performed without and with the administration of 75 mL of iohexol  (OMNIPAQUE ) 350 MG/ML injection. Multiplanar 2D and/or 3D reformatted images are provided for review. Automated exposure control, iterative reconstruction, and/or weight based adjustment of the mA/kV was utilized to reduce the radiation dose to as low as reasonably achievable. Stenosis of the internal carotid arteries measured using NASCET criteria. COMPARISON: Code stroke head CT 09/13/2024 (reported separately), recent CTA head and neck 09/10/2024. CLINICAL HISTORY: 83 year old male with acute neuro deficit, stroke suspected. FINDINGS: CTA NECK: AORTIC ARCH AND ARCH VESSELS: Tortuous aortic arch with mild forage calcified atherosclerosis. 3 vessel arch configuration. No dissection or arterial injury. No significant stenosis of the brachiocephalic or subclavian arteries. Proximal subclavian arteries are stable with tortuosity, no stenosis. CERVICAL CAROTID ARTERIES: Right carotid artery remains patent to the skull base with stable atherosclerosis at the right ICA origin less than 50%  stenosis. Left carotid artery remains patent to the skull base with stable atherosclerosis at the left ICA origin and bulb, narrowing the vessel to 1.9 mm versus 5 mm normally, resulting in 62% stenosis. No dissection or arterial injury. CERVICAL VERTEBRAL ARTERIES: Dominant left vertebral artery is patent and normal to the skull base. Nondominant right vertebral artery is patent, with stable mild stenosis at its origin. Stable mild irregularity and stenosis of the dominant left vertebral artery as it crosses the dura, mild left V4 irregularity. Patent left PICA origin. The left vertebral primarily supplies the  basilar artery as before. Distal right vertebral artery is stable with mild irregularity and stenosis, patent right PICA origin. No dissection or arterial injury. LUNGS AND MEDIASTINUM: Centrilobular emphysema. Negative visible superior mediastinum. SOFT TISSUES: Negative nonvascular neck soft tissue spaces. No acute abnormality. BONES: Absent dentition. Exaggerated cervical lordosis and upper thoracic kyphosis. No acute abnormality. CTA HEAD: ANTERIOR CIRCULATION: Bilateral ICA siphons remain patent. Left siphon moderate calcified plaque and small supraclinoid left ICA 2 to 3 mm aneurysm (series 15 image 109) are stable. Only mild left siphon stenosis. Right ICA siphon moderate calcified plaque and mild to moderate supraclinoid right ICA stenosis are stable. MCA and ACA origins remain patent. Nondominant left A1 segment redemonstrated. Dominant left ACA A2 segment redemonstrated. Bilateral ACA branches are stable. Left MCA M1 segment and bifurcation remain patent without stenosis. Right MCA M1 segment and bifurcation remain patent without stenosis. Bilateral MCA branches are stable. No other aneurysm identified in the anterior circulation. POSTERIOR CIRCULATION: Basilar artery remains patent. SCA and PCA origins are patent. Bilateral PCA branches remain patent, mild to moderate left P2 segment irregularity  does not appear significantly changed. No other aneurysm identified in the posterior circulation. OTHER: Early venous contrast timing on this exam, major dural venous sinuses appear grossly patent. Preliminary Report of this exam discussed by telephone with Neurology provider [facility] at 1035 hours. IMPRESSION: 1. Stable CTA head and neck since 09/10/24 with no large vessel occlusion. 2. Stable atherosclerosis at the left ICA origin and bulb with 62% stenosis. 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 And stable less pronounced atherosclerosis elsewhere. 3. Emphysema. Electronically signed by: Helayne Hurst MD 09/13/2024 10:45 AM EST RP Workstation: [id number]   CT HEAD CODE STROKE WO CONTRAST Result Date: 09/13/2024 EXAM: CT HEAD WITHOUT CONTRAST 09/13/2024 10:14:31 AM TECHNIQUE: CT of the head was performed without the administration of intravenous contrast. Automated exposure control, iterative reconstruction, and/or weight based adjustment of the mA/kV was utilized to reduce the radiation dose to as low as reasonably achievable. COMPARISON: Brain MRI 09/09/2024, Head CT 09/10/2024. CLINICAL HISTORY: 83 year old male with acute neuro deficit, stroke suspected. FINDINGS: BRAIN AND VENTRICLES: Trace new left superior convexity subarachnoid blood, 1 to 2 sulci affected and best seen on series 6 image 39 sagittal images. No progressive edema there, no mass effect. No other acute intracranial hemorrhage. No convincing cortically based infarct by CT. No hydrocephalus. No extra-axial collection. No mass effect or midline shift. Stable brain volume. Patchy and confluent, mild to moderate periventricular white matter hypodensity has not significantly changed. Asymmetric right superior temporal gyrus white matter hypodensity also stable to recent exams. Calcified atherosclerosis. No suspicious intracranial vascular hyperdensity. ORBITS: No acute abnormality. Mild leftward gaze. SINUSES: Paranasal sinuses, middle ears  and mastoids remain well aerated. SOFT TISSUES AND SKULL: No acute soft tissue abnormality. No skull fracture. [place] stroke program early CT score (aspects) ----- Ganglionic (caudate, ic, lentiform nucleus, insula, M1-m3): 7 Supraganglionic (m4-m6): 3 Total: 10 IMPRESSION: 1. Trace new left superior convexity subarachnoid hemorrhage (12 sulci), without mass effect. 2. Otherwise stable non contrast CT appearance of the brain: Nonspecific white matter changes, No convincing cortically based infarct by CT. ASPECTS 10. 3. These results were communicated to Dr. Lindzen at 10:23 hours on 09/13/2024 by text page via the [facility] messaging system. Electronically signed by: Helayne Hurst MD 09/13/2024 10:26 AM EST RP Workstation: [id number]    Scheduled Meds:  Chlorhexidine Gluconate Cloth  6 each Topical Daily   enoxaparin (LOVENOX) injection  30  mg Subcutaneous Q24H   feeding supplement  1 Container Oral TID BM   lidocaine   1 Application Urethral Once   multivitamin with minerals  1 tablet Oral Daily   pantoprazole   40 mg Oral BID   polyethylene glycol  17 g Oral Daily   rosuvastatin  10 mg Oral Daily   senna  2 tablet Oral Daily   sodium chloride   1 g Oral TID WC   vitamin B-12  500 mcg Oral Daily   Continuous Infusions:  sodium chloride  100 mL/hr at 09/14/24 0448   acyclovir (ZOVIRAX) 455 mg in dextrose  5 % 100 mL IVPB Stopped (09/13/24 2312)     LOS: 6 days   Fredia Skeeter, MD [facility]  09/14/2024, 11:11 AM   *Please note that this is a verbal dictation therefore any spelling or grammatical errors are due to the Dragon Medical One system interpretation.  Please page via Amion and do not message via secure chat for urgent patient care matters. Secure chat can be used for non urgent patient care matters.  How to contact the [facility] Attending or Consulting provider 7A - 7P or covering provider during after hours 7P -7A, for this patient?  Check the care team in [facility] and look for a)  attending/consulting [facility] provider listed and b) the [facility] team listed. Page or secure chat 7A-7P. Log into [email or web address] and use 's universal password to access. If you do not have the password, please contact the hospital operator. Locate the [facility] provider you are looking for under [facility] and page to a number that you can be directly reached. If you still have difficulty reaching the provider, please page the [facility] (Director on Call) for the Hospitalists listed on amion for assistance.

## 2024-09-14 NOTE — Progress Notes (Addendum)
 Physical Therapy Treatment Patient Details Name: Noah Burnett MRN: [id number] DOB: Dec 03, 1940 Today's Date: 09/14/2024   History of Present Illness 83 y.o. male presents to [facility] 09/08/24 after unwitnessed fall and AMS. Admitted with failure to thrive after multiple GI procedures. MRI brain concerning for multiple lesions with differentials that include encephalitis versus infectious etiology versus embolic infarcts. EEG showed encephalopathy. 11/5 code stroke due to L gaze preference and decreased LOA, however, no changes from prior CT head. PMHx: HTN, prediabetes, GERD, gastric outlet obstruction w/ history of bypass gastrojejunostomy   PT Comments  Pt continues to progress well towards acute PT goals. Pt improved in today's session by needing less assistance for bed mobility and ability to ambulate further with use of RW. Pt required MinA for bed mobility and MinA to stand with use of RW. Pt was able to ambulate 4 sets of 87ft with MinA/ModA with +2 for chair follow. Pt had a quick gait speed with cues needed to slow down and for safety. Pt would become fatigued with LE's shaking, however, had decreased awareness into the need for a rest break. PT determining scheduled standing/seated rest breaks for safety. Pt had one instance of B LE's buckling with ModA to correct loss of balance. Updated PT goals to reflect progress. Continue to recommend >3hrs post acute rehab with acute PT to follow.     If plan is discharge home, recommend the following: A lot of help with bathing/dressing/bathroom;Assistance with cooking/housework;Direct supervision/assist for medications management;Direct supervision/assist for financial management;Assist for transportation;Help with stairs or ramp for entrance;Two people to help with walking and/or transfers   Can travel by private vehicle     No   Equipment Recommendations  BSC/3in1       Precautions / Restrictions Precautions Precautions: Fall Recall of  Precautions/Restrictions: Impaired Restrictions Weight Bearing Restrictions Per Provider Order: No     Mobility  Bed Mobility Overal bed mobility: Needs Assistance Bed Mobility: Supine to Sit    Supine to sit: Min assist    General bed mobility comments: MinA to raise trunk with pt able to scoot forwards with increased time and effort    Transfers Overall transfer level: Needs assistance Equipment used: Rolling walker (2 wheels) Transfers: Sit to/from Stand Sit to Stand: Min assist    General transfer comment: MinA for slight steadying assist with cues for hand placement. Pt unable to follow cues and would push up with hands on RW handles. Unable to reach hands back for arm rests without assist    Ambulation/Gait Ambulation/Gait assistance: Mod assist, +2 safety/equipment, Min assist Gait Distance (Feet): 60 Feet (x4) Assistive device: Rolling walker (2 wheels) Gait Pattern/deviations: Narrow base of support, Step-through pattern, Decreased stride length, Drifts right/left       General Gait Details: cues to slow down as pt attempted quick gait speed with difficulty staying in RW. Slight drift to the right with ability to correct with cues. Forward flexed posture with pt tending to look at the ground. x1 LOB requiring ModA to correct with chair follow for safety. LE instability with increased distance with PT enforcing rest breaks    Modified Rankin (Stroke Patients Only) Modified Rankin (Stroke Patients Only) Pre-Morbid Rankin Score: Moderately severe disability Modified Rankin: Moderately severe disability     Balance Overall balance assessment: Needs assistance Sitting-balance support: Feet supported Sitting balance-Leahy Scale: Fair     Standing balance support: Bilateral upper extremity supported, During functional activity, Reliant on assistive device for balance Standing balance-Leahy Scale: Poor  Standing balance comment: reliant on UE and external support        Communication Communication Communication: Impaired Factors Affecting Communication: Reduced clarity of speech;Difficulty expressing self  Cognition Arousal: Alert Behavior During Therapy: Flat affect   PT - Cognitive impairments: Difficult to assess Difficult to assess due to: Impaired communication    PT - Cognition Comments: Limited verbalizations with pt occasionally nodding yes/no. Mainly incomprehensible speech with pt mumbling. Following commands: Impaired Following commands impaired: Follows one step commands inconsistently    Cueing Cueing Techniques: Verbal cues, Tactile cues, Visual cues  Exercises General Exercises - Lower Extremity Long Arc Quad: AROM, Both, 10 reps, Seated        Pertinent Vitals/Pain Pain Assessment Pain Assessment: No/denies pain     PT Goals (current goals can now be found in the care plan section) Acute Rehab PT Goals PT Goal Formulation: Patient unable to participate in goal setting Time For Goal Achievement: 09/24/24 Potential to Achieve Goals: Fair Progress towards PT goals: Progressing toward goals    Frequency    Min 3X/week       AM-PAC PT 6 Clicks Mobility   Outcome Measure  Help needed turning from your back to your side while in a flat bed without using bedrails?: A Little Help needed moving from lying on your back to sitting on the side of a flat bed without using bedrails?: A Little Help needed moving to and from a bed to a chair (including a wheelchair)?: A Little Help needed standing up from a chair using your arms (e.g., wheelchair or bedside chair)?: A Little Help needed to walk in hospital room?: A Lot Help needed climbing 3-5 steps with a railing? : Total 6 Click Score: 15    End of Session Equipment Utilized During Treatment: Gait belt Activity Tolerance: Patient tolerated treatment well Patient left: in chair;with call bell/phone within reach;with chair alarm set;with family/visitor present Nurse  Communication: Mobility status PT Visit Diagnosis: Unsteadiness on feet (R26.81);Other abnormalities of gait and mobility (R26.89);Muscle weakness (generalized) (M62.81);History of falling (Z91.81)     Time: 8964-8946 PT Time Calculation (min) (ACUTE ONLY): 18 min  Charges:    $Gait Training: 8-22 mins PT General Charges $$ ACUTE PT VISIT: 1 Visit                    Kate ORN, PT, DPT Secure Chat Preferred  Rehab Office [phone]   Kate BRAVO Wendolyn 09/14/2024, 1:31 PM

## 2024-09-14 NOTE — Progress Notes (Signed)
 NEUROLOGY CONSULT FOLLOW UP NOTE   Date of service: September 14, 2024 Patient Name: Noah Burnett MRN:  [id number] DOB:  [birth year]  Interval Hx/subjective  Seen in room today, no family at the bedside.  He seems to be more awake today, moving all extremities.  Left is slightly weaker than the right but strength is improved from yesterday. Speech is still dysarthric but does attempt to answer questions.  Very difficult to understand  Vitals   Vitals:   09/13/24 2100 09/14/24 0000 09/14/24 0414 09/14/24 0802  BP: 134/71 (!) 142/70 126/67 (!) 144/67  Pulse: (!) 50 60 (!) 51 65  Resp: 18  18 15   Temp: (!) 97.5 F (36.4 C) (!) 97.4 F (36.3 C) 97.6 F (36.4 C) 97.9 F (36.6 C)  TempSrc: Axillary Axillary Oral Oral  SpO2: 99% 99% 98% 96%  Weight:      Height:         Body mass index is 15.75 kg/m.  Physical Exam   General: Well-nourished, well-developed elderly patient resting comfortably with eyes open HEENT: Normocephalic atraumatic Lungs: Respirations regular and unlabored on room air   NEURO:  Mental Status:  Speech is dysarthric, but follows simple commands consistently States name is Noah Burnett, unable to state where we are or why he is in the hospital. Cranial Nerves:  II: PERRL.  III, IV, VI: EOMI, eyelids elevate symmetrically. V: Sensation is intact to coarse touch but more sensitive on the left VII: Right facial droop VIII: Hearing intact to voice. IX, X: Mildly dysarthric XII: tongue is midline without fasciculations. Motor: Less responsive on the right to noxious stimuli.  Bilateral lower extremities right weaker than left to commands, but withdraws to noxious stimuli more on the right than left. Moves antigravity  Tone is normal and bulk is normal Sensation- Intact to light touch bilaterally.  Coordination: FTN intact bilaterally Gait- Deferred  Medications  Current Facility-Administered Medications:    0.9 %  sodium chloride  infusion, , Intravenous,  Continuous, Pahwani, Ravi, MD, Last Rate: 100 mL/hr at 09/14/24 0448, Infusion Verify at 09/14/24 0448   acetaminophen  (TYLENOL ) tablet 650 mg, 650 mg, Oral, Q6H PRN, Tat, David, MD   acyclovir (ZOVIRAX) 455 mg in dextrose  5 % 100 mL IVPB, 10 mg/kg, Intravenous, Q24H, Dang, Thuy D, RPH, Stopped at 09/13/24 2312   Chlorhexidine Gluconate Cloth 2 % PADS 6 each, 6 each, Topical, Daily, Rathore, Vasundhra, MD   enoxaparin (LOVENOX) injection 30 mg, 30 mg, Subcutaneous, Q24H, Tat, David, MD, 30 mg at 09/13/24 2209   feeding supplement (BOOST / RESOURCE BREEZE) liquid 1 Container, 1 Container, Oral, TID BM, Tat, Alm, MD, 1 Container at 09/12/24 0951   lidocaine  (XYLOCAINE ) 2 % jelly 1 Application, 1 Application, Urethral, Once, Vernon Ranks, MD   loperamide (IMODIUM) capsule 2 mg, 2 mg, Oral, PRN, Howerter, Justin B, DO, 2 mg at 09/11/24 2328   multivitamin with minerals tablet 1 tablet, 1 tablet, Oral, Daily, Tat, David, MD, 1 tablet at 09/12/24 9070   ondansetron  (ZOFRAN ) tablet 4 mg, 4 mg, Oral, Q6H PRN **OR** ondansetron  (ZOFRAN ) injection 4 mg, 4 mg, Intravenous, Q6H PRN, Tat, David, MD   pantoprazole  (PROTONIX ) EC tablet 40 mg, 40 mg, Oral, BID, Akula, Vijaya, MD, 40 mg at 09/13/24 2213   polyethylene glycol (MIRALAX / GLYCOLAX) packet 17 g, 17 g, Oral, Daily, Tat, David, MD, 17 g at 09/12/24 0929   rosuvastatin (CRESTOR) tablet 10 mg, 10 mg, Oral, Daily, Arora, Ashish, MD, 10 mg at 09/12/24  9070   senna (SENOKOT) tablet 17.2 mg, 2 tablet, Oral, Daily, Tat, David, MD, 17.2 mg at 09/12/24 9070   sodium chloride  tablet 1 g, 1 g, Oral, TID WC, Melia Lynwood ORN, MD, 1 g at 09/13/24 1721   vitamin B-12 (CYANOCOBALAMIN) tablet 500 mcg, 500 mcg, Oral, Daily, Cherlyn Labella, MD, 500 mcg at 09/12/24 0929  Labs and Diagnostic Imaging   CBC:  Recent Labs  Lab 09/13/24 0155 09/14/24 0244  WBC 13.7* 11.6*  NEUTROABS 12.1* 9.8*  HGB 15.4 13.1  HCT 43.7 36.8*  MCV 85.7 85.0  PLT 227 198    Basic  Metabolic Panel:  Lab Results  Component Value Date   NA 129 (L) 09/14/2024   K 4.0 09/14/2024   CO2 21 (L) 09/14/2024   GLUCOSE 134 (H) 09/14/2024   BUN 24 (H) 09/14/2024   CREATININE 2.07 (H) 09/14/2024   CALCIUM 7.8 (L) 09/14/2024   GFRNONAA 32 (L) 09/14/2024   GFRAA >60 03/27/2018   Lipid Panel:  Lab Results  Component Value Date   LDLCALC 75 09/10/2024   HgbA1c:  Lab Results  Component Value Date   HGBA1C 5.6 09/10/2024    CSF results: Tube #1: 2 WBCs, 12 RBCs, protein 65, glucose 44 Tube #4: 1 WBC, 0 RBC Meningitis encephalitis panel negative Cytology pending Flow cytometry pending Autoimmune panel/paraneoplastic panel pending VDRL pending CSF Gram stain - WBC present with no organisms VZV PCR serum pending VZV PCR CSF pending [facility] spotted fever antibodies pending  CT Head (Code Stroke this AM 11/5): Trace new left superior convexity subarachnoid hemorrhage (1-2 sulci), without mass effect. Otherwise stable non contrast CT appearance of the brain: Nonspecific white matter changes, No convincing cortically based infarct by CT. ASPECTS 10.  MRI brain (11/1): multiple subcortical contrast-enhancing areas greater in the right parieto-occipital fissure but present at multiple subcortical locations in both MCAs. Small areas of diffusion abnormality at the anterior insula on the left, left frontal operculum and right frontal operculum in multiple locations in both corona radiata and some lesions appearing more acute than others based on lower ADC values. Differentials include infectious versus inflammatory encephalitis, VZV or other vasculitis, as well as recent embolic showers, with demyelinating disease being lower on the DDx  Routine EEG: Intermittent generalized slowing  Repeat EEG (11/5): Continuous slow, generalized. This study is suggestive of generalized cerebral dysfunction (encephalopathy). No seizures or epileptiform discharges were seen throughout  the recording.  Repeat MRI brain (11/5):  1. Prominent patchy cortical and subcortical signal abnormality throughout both cerebral hemispheres, overall slightly progressed from 09/09/2024 and indeterminate although encephalitis and vasculitis remain leading considerations. 2. Small volume left parietal subarachnoid hemorrhage.  TTE (11/2): 1. Left ventricular ejection fraction, by estimation, is 55%. Left  ventricular ejection fraction by 3D volume is 52 %. The left ventricle has  normal function. The left ventricle has no regional wall motion  abnormalities. Left ventricular diastolic  parameters are consistent with Grade I diastolic dysfunction (impaired  relaxation). The average left ventricular global longitudinal strain is  -13.3 %.   2. Right ventricular systolic function is normal. The right ventricular  size is normal. Tricuspid regurgitation signal is inadequate for assessing  PA pressure.   3. The mitral valve is normal in structure. Trivial mitral valve  regurgitation. No evidence of mitral stenosis.   4. The aortic valve is tricuspid. Aortic valve regurgitation is mild. No  aortic stenosis is present.   5. The inferior vena cava is normal in size  with greater than 50%  respiratory variability, suggesting right atrial pressure of 3 mmHg.    Assessment  JURIS GOSNELL is an 83 y.o. 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 MRI was performed which revealed enhancing and nonenhancing lesions with a broad differential which included VZV vasculitis/encephalitis, inflammatory, other infectious, and paraneoplastic processes. Also of note-had shingles infection which was treated, so a viral encephalitis is felt to be highest on the DDx. Per the literature, approximately 1/400 cases of VZV reactivation present with VZV encephalitis.  - Already on acyclovir for viral encephalitis coverage. Will  continue for a total of 14 days per literature search on VZV encephalitis/vasculitis. Also will start high-dose IV methylprednisolone today x 5 days, followed by oral prednisone taper. 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  - CSF results as documented above. Cell count is not suspicious for a meningitis, but is compatible with a vasculitis or viral encephalitis.  He also has increased protein in CSF. Overall, MRI contrast-enhancing lesions are most suspicious for VZV encephalitis/vasculitis, and patient had improved since starting acyclovir, followed by the acute decompensation yesterday (Wednesday).   - Patient does have a history of [place] spotted fever a few years ago which was appropriately treated per chart and family and his MRI brain was also compatible with this.  However, although RMSF could also cause encephalitis, it would be unlikely that this would happen several years after diagnosis and treatment.  Patient's family states that when he is in his normal state of health, he is often outdoors and may have had an unnoticed tick bite.  However, he has been less active since his GI issues began in July. Overall, RMSF is low on the DDx for his current presentation.  - Regarding his duodenal lesion, there are [facility] notes from 9/25 stating that he had a duodenal stricture, without any dysplasia or malignancy. Therefore, metastatic lesions are significantly lower on the DDx. His MRI findings would also be atypical for metastatic disease.  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 A1c at goal below 6, LDL near goal.  Goal is less than 70, his LDL is 75. - Encephalopathy and strokes may be able  to tie down together with 1 diagnosis of vasculitis, especially VZV vasculitis given recent shingles.  VZV PCR serum and CSF have been ordered as meningitis/encephalitis panel was negative. CSF VZV Ig G has come back positive.  - Repeat MRI shows prominent patchy cortical and subcortical call signal abnormality throughout both cerebral hemispheres  Acute Neuro Change - Stat CT head and CTA Head and Neck completed Wednesday after he decompensated, with trace new left superior convexity subarachnoid hemorrhage, without mass effect. - ASA 81 mg discontinued for now  - Repeat MRI Brain on 11/5 shows a small volume left parietal subarachnoid hemorrhage. - rEEG 11/5: Continuous generalized slowing. No seizures or epileptiform discharges were seen throughout the recording.   # For the encephalopathy - Await full testing results from CSF - Continue antiviral for a total of 14 days. Intense drug monitoring: Continue to closely monitor Cr, and dose adjust acyclovir. Continue with ivf to minimize aki on ckd  -  Also will start high-dose IV methylprednisolone today x 5 days, followed by oral prednisone taper.  - No need for antiseizure medications-no evidence of seizure and EEG unremarkable  ___________________________________________________________  Patient seen and examined by NP/APP.  Jorene Last, DNP, FNP-BC [facility] Pager: [phone]  Electronically signed: Dr. Star Resler

## 2024-09-14 NOTE — Progress Notes (Addendum)
 Nutrition Follow-up  DOCUMENTATION CODES:   Severe malnutrition in context of chronic illness, Underweight  INTERVENTION:  After Cortrak placement, initiate Osmolite 1.5 at 20 mL/hr. Increase by 10 mL/hr every 12 hours as tolerated to 40 mL/hr goal. ProSource TF20 once daily via feeding tube. This EN regimen provides 1520 Kcals, 80 g protein, 195 g carbohydrates, and 732 mL free water daily, which provides 100% of the patient's estimated nutrition needs. Minimal water flushes to maintain feeding tube patency in light of persistent hyponatremia. Add Thiamine 100 mg PO daily for 7 days. Continue Multivitamin PO daily. Daily weights. Discontinue Boost Breeze - patient dislikes them. Discontinue Magic Cup - patient does not want anything with dairy in it. Trial [employer] 1.4 PO BID. Each supplement provides 455 Kcals and 20 grams protein. Continue Dysphagia 3 diet with thin liquids - advancement per SLP.  NUTRITION DIAGNOSIS:   Severe Malnutrition related to chronic illness as evidenced by energy intake < or equal to 75% for > or equal to 1 month, severe muscle depletion, severe fat depletion, percent weight loss. - remains applicable  GOAL:   Patient will meet greater than or equal to 90% of their needs - not currently being met, progressing  MONITOR:   PO intake, Supplement acceptance, Diet advancement, Labs, Weight trends, TF tolerance   REASON FOR ASSESSMENT:   Follow-up for: Consult Assessment of nutrition requirement/status  ASSESSMENT:   83 yo male admitted with failure to thrive. 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  11/3 - patient's son and daughter at bedside report poor PO intake since duodenal mass diagnosis in July with worsening intake X 6 weeks prior to admission due to shingles with only bites of food. 50% of meals/ONS  reported this day with improved ONS intake when changed to [employer].  Update: The patient was downgraded from reg diet to Dys 3/thin s/p SLP eval on 11/5. Visited the patient with his daughter [name] at bedside. [name] states the patient did not eat yesterday and just kept sleeping. He only took bites of breakfast today and has not wanted to drink [employer]. He will not drink anything with dairy in it because it affects his bowel movements. Suggested switching ONS to [employer]. Discussed Cortrak and EN with the patient and [name] since the patient has had poor PO intake for a few months and is not currently improving. [name] agreed to pursue it. Epic chat Dr Vernon who has placed orders for Cortrak.   Scheduled Meds:  Chlorhexidine Gluconate Cloth  6 each Topical Daily   enoxaparin (LOVENOX) injection  30 mg Subcutaneous Q24H   feeding supplement ([employer] STANDARD ENT 1.4)  325 mL Oral BID BM   lidocaine   1 Application Urethral Once   multivitamin with minerals  1 tablet Oral Daily   pantoprazole   40 mg Oral BID   polyethylene glycol  17 g Oral Daily   rosuvastatin  10 mg Oral Daily   senna  2 tablet Oral Daily   sodium chloride   1 g Oral TID WC   thiamine  100 mg Oral Daily   vitamin B-12  500 mcg Oral Daily   Continuous Infusions:  sodium chloride  100 mL/hr at 09/14/24 0448   acyclovir (ZOVIRAX) 455 mg in dextrose  5 % 100 mL IVPB Stopped (09/13/24 2312)    Diet Order  DIET DYS 3 Room service appropriate? No; Fluid consistency: Thin  Diet effective now                  Meal Intake: 10% breakfast today per Dr [name], zero intake yesterday  Labs:     Latest Ref Rng & Units 09/14/2024    2:44 AM 09/13/2024    1:55 AM 09/12/2024    1:58 AM  CMP  Glucose 70 - 99 mg/dL 865  852  854   BUN 8 - 23 mg/dL 24  17  12    Creatinine 0.61 - 1.24 mg/dL 7.92  8.38  9.23   Sodium 135 - 145 mmol/L 129  128  125   Potassium 3.5 - 5.1 mmol/L 4.0  4.1  3.0   Chloride 98 -  111 mmol/L 99  93  91   CO2 22 - 32 mmol/L 21  23  20    Calcium 8.9 - 10.3 mg/dL 7.8  8.1  8.0   Total Protein 6.5 - 8.1 g/dL 4.6  5.3  5.6   Total Bilirubin 0.0 - 1.2 mg/dL 1.0  1.0  1.4   Alkaline Phos 38 - 126 U/L 56  63  66   AST 15 - 41 U/L 19  23  21    ALT 0 - 44 U/L 14  12  14       I/O: +2 L since admit  NUTRITION - FOCUSED PHYSICAL EXAM:  Flowsheet Row Most Recent Value  Orbital Region Moderate depletion  Upper Arm Region Severe depletion  Thoracic and Lumbar Region Moderate depletion  Buccal Region Severe depletion  Temple Region Severe depletion  Clavicle Bone Region Severe depletion  Clavicle and Acromion Bone Region Severe depletion  Scapular Bone Region Moderate depletion  Dorsal Hand Moderate depletion  Patellar Region Moderate depletion  Anterior Thigh Region Severe depletion  Posterior Calf Region Severe depletion  Hair Reviewed  Eyes Reviewed  Mouth Reviewed  Skin Reviewed  Nails Reviewed    EDUCATION NEEDS:   Not appropriate for education at this time  Skin:  Skin Assessment: Reviewed RN Assessment  Last BM:  11/4 type 5  Height:   Ht Readings from Last 1 Encounters:  09/08/24 5' 7 (1.702 m)    Weight:   18% weight loss in 3 months  Ideal Body Weight:  67.3 kg  BMI:  Body mass index is 15.75 kg/m.  Estimated Nutritional Needs:  Kcal:  1500-1700 Protein:  75-90 gm Fluid:  1.5-1.7 L    Leverne Ruth, MS, RDN, LDN [facility] See AMION for contact information

## 2024-09-14 NOTE — Progress Notes (Signed)
 Patient ID: Noah Burnett, male   DOB: Feb 04, 1941, 83 y.o.   MRN: [id number] S:Family at bedside and report some improvement this morning. O:BP (!) 143/68 (BP Location: Left Arm)   Pulse (!) 54   Temp 98.6 F (37 C) (Oral)   Resp 20   Ht 5' 7 (1.702 m)   Wt 45.6 kg   SpO2 100%   BMI 15.75 kg/m   Intake/Output Summary (Last 24 hours) at 09/14/2024 1627 Last data filed at 09/14/2024 1400 Gross per 24 hour  Intake 2563.76 ml  Output 2900 ml  Net -336.24 ml   Intake/Output: I/O last 3 completed shifts: In: 2094.3 [I.V.:1876.1; IV Piggyback:218.2] Out: 2000 [Urine:2000]  Intake/Output this shift:  Total I/O In: 1159.9 [P.O.:240; I.V.:919.9] Out: 900 [Urine:900] Weight change:  Gen: NAD CVS: bradycardic Resp: CTA Abd: +BS, soft, NT/ND Ext: no edema  Recent Labs  Lab 09/08/24 1229 09/09/24 0333 09/10/24 0255 09/11/24 0258 09/11/24 1829 09/12/24 0158 09/13/24 0155 09/14/24 0244  NA 128* 128* 127* 125* 126* 125* 128* 129*  K 3.4* 3.7 4.1 3.6  --  3.0* 4.1 4.0  CL 90* 92* 91* 93*  --  91* 93* 99  CO2 29 22 24  21*  --  20* 23 21*  GLUCOSE 96 81 95 85  --  145* 147* 134*  BUN 11 9 9 12   --  12 17 24*  CREATININE 0.74 0.67 0.73 0.67  --  0.76 1.61* 2.07*  ALBUMIN 4.0  --  3.0* 2.6*  --  3.0* 3.0* 2.4*  CALCIUM 8.7* 8.6* 8.3* 7.9*  --  8.0* 8.1* 7.8*  PHOS  --   --  3.6  --   --   --   --   --   AST 14*  --  14* 12*  --  21 23 19   ALT 12  --  11 9  --  14 12 14    Liver Function Tests: Recent Labs  Lab 09/12/24 0158 09/13/24 0155 09/14/24 0244  AST 21 23 19   ALT 14 12 14   ALKPHOS 66 63 56  BILITOT 1.4* 1.0 1.0  PROT 5.6* 5.3* 4.6*  ALBUMIN 3.0* 3.0* 2.4*   Recent Labs  Lab 09/08/24 1229  LIPASE 27   No results for input(s): AMMONIA in the last 168 hours. CBC: Recent Labs  Lab 09/10/24 0255 09/11/24 0258 09/12/24 0158 09/13/24 0155 09/14/24 0244  WBC 8.5 6.1 11.2* 13.7* 11.6*  NEUTROABS 6.2 4.2 9.6* 12.1* 9.8*  HGB 14.6 13.4 15.5 15.4 13.1   HCT 42.1 37.5* 43.6 43.7 36.8*  MCV 87.3 85.4 84.7 85.7 85.0  PLT 214 183 234 227 198   Cardiac Enzymes: Recent Labs  Lab 09/10/24 0255  CKTOTAL 30*   CBG: Recent Labs  Lab 09/09/24 1201 09/09/24 1242 09/13/24 1000  GLUCAP 68* 141* 144*    Iron Studies: No results for input(s): IRON, TIBC, TRANSFERRIN, FERRITIN in the last 72 hours. Studies/Results: MR BRAIN WO CONTRAST Result Date: 09/13/2024 EXAM: MRI BRAIN WITHOUT CONTRAST 09/13/2024 04:16:00 PM TECHNIQUE: Multiplanar multisequence MRI of the head/brain was performed without the administration of intravenous contrast. COMPARISON: Head CT and CTA 09/13/2024 and MRI 09/09/2024. CLINICAL HISTORY: Neuro deficit, acute, stroke suspected. FINDINGS: The examination is intermittently up to moderately motion degraded. BRAIN AND VENTRICLES: Numerous patchy areas of cortical and subcortical T2 hyperintensity and diffusion weighted signal hyperintensity are again seen scattered throughout both cerebral hemispheres. Some areas appear mildly progressive on diffusion weighted imaging, such as small regions of  restricted diffusion involving cortex and subcortical white matter in the posterior left frontal lobe and left corona radiata, while the signal changes on T2 and FLAIR sequences are stable to at most minimally increased. Gyral intrinsic T1 hyperintensity is again noted along the right parietooccipital sulcus. A small amount of susceptibility within the left postcentral sulcus corresponds to subarachnoid hemorrhage which is new from the prior MRI but was seen on today's earlier CT. There is mild to moderate cerebral atrophy. Background chronic small vessel ischemia is noted in the cerebral white matter. No midline shift, hydrocephalus, or extra axial fluid collection is identified. Major intracranial vascular flow voids are preserved. ORBITS: Bilateral cataract extraction. SINUSES AND MASTOIDS: No acute abnormality. BONES AND SOFT TISSUES:  Normal marrow signal. No acute soft tissue abnormality. IMPRESSION: 1. Prominent patchy cortical and subcortical signal abnormality throughout both cerebral hemispheres, overall slightly progressed from 09/09/2024 and indeterminate although encephalitis and vasculitis remain leading considerations. 2. Small volume left parietal subarachnoid hemorrhage. Electronically signed by: Dasie Hamburg MD 09/13/2024 04:35 PM EST RP Workstation: [id number]   EEG adult Result Date: 09/13/2024 Shelton Arlin KIDD, MD     09/13/2024  2:58 PM Patient Name: Noah Burnett MRN: [id number] Epilepsy Attending: Arlin KIDD Shelton Referring Physician/Provider: Remi Pippin, NP Date: 09/13/2024 Duration: 24.05 mins Patient history: 83 year old male with altered mental status.  EEG evaluate for seizure. Level of alertness: Awake AEDs during EEG study: None Technical aspects: This EEG study was done with scalp electrodes positioned according to the 10-20 International system of electrode placement. Electrical activity was reviewed with band pass filter of 1-70Hz , sensitivity of 7 uV/mm, display speed of 55mm/sec with a 60Hz  notched filter applied as appropriate. EEG data were recorded continuously and digitally stored.  Video monitoring was available and reviewed as appropriate. Description: EEG showed continuous generalized 3 to 6 Hz theta-delta slowing admixed with 12-15hz  beta activity. Hyperventilation and photic stimulation were not performed.   ABNORMALITY - Continuous slow, generalized IMPRESSION: This study is suggestive of generalized cerebral dysfunction (encephalopathy). No seizures or epileptiform discharges were seen throughout the recording. Priyanka KIDD Shelton   CT ANGIO HEAD NECK W WO CM (CODE STROKE) Result Date: 09/13/2024 EXAM: CTA HEAD AND NECK WITHOUT AND WITH 09/13/2024 10:29:28 AM TECHNIQUE: CTA of the head and neck was performed without and with the administration of 75 mL of iohexol  (OMNIPAQUE ) 350 MG/ML injection.  Multiplanar 2D and/or 3D reformatted images are provided for review. Automated exposure control, iterative reconstruction, and/or weight based adjustment of the mA/kV was utilized to reduce the radiation dose to as low as reasonably achievable. Stenosis of the internal carotid arteries measured using NASCET criteria. COMPARISON: Code stroke head CT 09/13/2024 (reported separately), recent CTA head and neck 09/10/2024. CLINICAL HISTORY: 83 year old male with acute neuro deficit, stroke suspected. FINDINGS: CTA NECK: AORTIC ARCH AND ARCH VESSELS: Tortuous aortic arch with mild forage calcified atherosclerosis. 3 vessel arch configuration. No dissection or arterial injury. No significant stenosis of the brachiocephalic or subclavian arteries. Proximal subclavian arteries are stable with tortuosity, no stenosis. CERVICAL CAROTID ARTERIES: Right carotid artery remains patent to the skull base with stable atherosclerosis at the right ICA origin less than 50% stenosis. Left carotid artery remains patent to the skull base with stable atherosclerosis at the left ICA origin and bulb, narrowing the vessel to 1.9 mm versus 5 mm normally, resulting in 62% stenosis. No dissection or arterial injury. CERVICAL VERTEBRAL ARTERIES: Dominant left vertebral artery is patent and normal to the skull base.  Nondominant right vertebral artery is patent, with stable mild stenosis at its origin. Stable mild irregularity and stenosis of the dominant left vertebral artery as it crosses the dura, mild left V4 irregularity. Patent left PICA origin. The left vertebral primarily supplies the basilar artery as before. Distal right vertebral artery is stable with mild irregularity and stenosis, patent right PICA origin. No dissection or arterial injury. LUNGS AND MEDIASTINUM: Centrilobular emphysema. Negative visible superior mediastinum. SOFT TISSUES: Negative nonvascular neck soft tissue spaces. No acute abnormality. BONES: Absent dentition.  Exaggerated cervical lordosis and upper thoracic kyphosis. No acute abnormality. CTA HEAD: ANTERIOR CIRCULATION: Bilateral ICA siphons remain patent. Left siphon moderate calcified plaque and small supraclinoid left ICA 2 to 3 mm aneurysm (series 15 image 109) are stable. Only mild left siphon stenosis. Right ICA siphon moderate calcified plaque and mild to moderate supraclinoid right ICA stenosis are stable. MCA and ACA origins remain patent. Nondominant left A1 segment redemonstrated. Dominant left ACA A2 segment redemonstrated. Bilateral ACA branches are stable. Left MCA M1 segment and bifurcation remain patent without stenosis. Right MCA M1 segment and bifurcation remain patent without stenosis. Bilateral MCA branches are stable. No other aneurysm identified in the anterior circulation. POSTERIOR CIRCULATION: Basilar artery remains patent. SCA and PCA origins are patent. Bilateral PCA branches remain patent, mild to moderate left P2 segment irregularity does not appear significantly changed. No other aneurysm identified in the posterior circulation. OTHER: Early venous contrast timing on this exam, major dural venous sinuses appear grossly patent. Preliminary Report of this exam discussed by telephone with Neurology provider [facility] at 1035 hours. IMPRESSION: 1. Stable CTA head and neck since 09/10/24 with no large vessel occlusion. 2. Stable atherosclerosis at the left ICA origin and bulb with 62% stenosis. 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 And stable less pronounced atherosclerosis elsewhere. 3. Emphysema. Electronically signed by: Helayne Hurst MD 09/13/2024 10:45 AM EST RP Workstation: [id number]   CT HEAD CODE STROKE WO CONTRAST Result Date: 09/13/2024 EXAM: CT HEAD WITHOUT CONTRAST 09/13/2024 10:14:31 AM TECHNIQUE: CT of the head was performed without the administration of intravenous contrast. Automated exposure control, iterative reconstruction, and/or weight based adjustment of the mA/kV  was utilized to reduce the radiation dose to as low as reasonably achievable. COMPARISON: Brain MRI 09/09/2024, Head CT 09/10/2024. CLINICAL HISTORY: 83 year old male with acute neuro deficit, stroke suspected. FINDINGS: BRAIN AND VENTRICLES: Trace new left superior convexity subarachnoid blood, 1 to 2 sulci affected and best seen on series 6 image 39 sagittal images. No progressive edema there, no mass effect. No other acute intracranial hemorrhage. No convincing cortically based infarct by CT. No hydrocephalus. No extra-axial collection. No mass effect or midline shift. Stable brain volume. Patchy and confluent, mild to moderate periventricular white matter hypodensity has not significantly changed. Asymmetric right superior temporal gyrus white matter hypodensity also stable to recent exams. Calcified atherosclerosis. No suspicious intracranial vascular hyperdensity. ORBITS: No acute abnormality. Mild leftward gaze. SINUSES: Paranasal sinuses, middle ears and mastoids remain well aerated. SOFT TISSUES AND SKULL: No acute soft tissue abnormality. No skull fracture. [place] stroke program early CT score (aspects) ----- Ganglionic (caudate, ic, lentiform nucleus, insula, M1-m3): 7 Supraganglionic (m4-m6): 3 Total: 10 IMPRESSION: 1. Trace new left superior convexity subarachnoid hemorrhage (12 sulci), without mass effect. 2. Otherwise stable non contrast CT appearance of the brain: Nonspecific white matter changes, No convincing cortically based infarct by CT. ASPECTS 10. 3. These results were communicated to Dr. Merrianne at 10:23 hours on  09/13/2024 by text page via the [facility] messaging system. Electronically signed by: Helayne Hurst MD 09/13/2024 10:26 AM EST RP Workstation: [id number]    Chlorhexidine Gluconate Cloth  6 each Topical Daily   enoxaparin (LOVENOX) injection  30 mg Subcutaneous Q24H   feeding supplement ([employer] STANDARD ENT 1.4)  325 mL Oral BID BM   lidocaine   1 Application Urethral Once    multivitamin with minerals  1 tablet Oral Daily   pantoprazole   40 mg Oral BID   polyethylene glycol  17 g Oral Daily   [START ON 09/19/2024] predniSONE  50 mg Oral Q breakfast   rosuvastatin  10 mg Oral Daily   senna  2 tablet Oral Daily   sodium chloride   1 g Oral TID WC   thiamine  100 mg Oral Daily   vitamin B-12  500 mcg Oral Daily    BMET    Component Value Date/Time   NA 129 (L) 09/14/2024 0244   K 4.0 09/14/2024 0244   CL 99 09/14/2024 0244   CO2 21 (L) 09/14/2024 0244   GLUCOSE 134 (H) 09/14/2024 0244   BUN 24 (H) 09/14/2024 0244   CREATININE 2.07 (H) 09/14/2024 0244   CALCIUM 7.8 (L) 09/14/2024 0244   GFRNONAA 32 (L) 09/14/2024 0244   GFRAA >60 03/27/2018 1003   CBC    Component Value Date/Time   WBC 11.6 (H) 09/14/2024 0244   RBC 4.33 09/14/2024 0244   HGB 13.1 09/14/2024 0244   HCT 36.8 (L) 09/14/2024 0244   PLT 198 09/14/2024 0244   MCV 85.0 09/14/2024 0244   MCH 30.3 09/14/2024 0244   MCHC 35.6 09/14/2024 0244   RDW 17.0 (H) 09/14/2024 0244   LYMPHSABS 0.8 09/14/2024 0244   MONOABS 1.0 09/14/2024 0244   EOSABS 0.0 09/14/2024 0244   BASOSABS 0.0 09/14/2024 0244    Noah Burnett is an 83 y.o. 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  He had a robotic gastrojejunostomy on 8/25 requiring TPN via PICC subsequently.  Patient reportedly has had decreased appetite, weight loss and inability to walk.  Reportedly repeat MRI which did not show the duodenal mass.  Patient brought in for failure to thrive as well as a unwitnessed fall. CT head was negative for large vessel occlusion, no e/o vasculitis, no other acute intracranial abnormality. Denies any NSAID use but he has not been eating much at all for at least a  month.    Assessment/Plan:   Hyponatremia - with a chronic component with SNa running at baseline in the 126-129 range. Initial TSH 3.7 but repeat 6.2 FT4 nl., Ur Na 137 and urine osm 490. 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 Cortisol HL, TSH nl and then high but FT4 nl  - Urine Na, K <154 - Incr NaCl tablets to TID; need solute to excrete free water. Gave trial of low dose Lasix to decrease concentration gradient as well.  - Sodium level up slightly to 129 but likely slowed due to the development of AKI and impaired free water handling. - If he drops below 120 will need 3% but  trying to avoid.   Sodium improving but renal function worsened; he has a h/o untreated prostate cancer (monitoring) and has a purewick. Will check a PVR; if neg wouild give isotonic fluid challenge.   New onset AKI - Scr climbing over the last 2 days.  Likely due to 2 IV contrasted studies done over the past 3 days.  UOP has dropped.  Would not recommend any further use of IV contrast at this time and follow renal function.  No evidence of uremic symptoms at this time and will continue to follow closely  Avoid nephrotoxic medications including NSAIDs and iodinated intravenous contrast exposure unless the latter is absolutely indicated.   Preferred narcotic agents for pain control are hydromorphone, fentanyl , and methadone. Morphine  should not be used.  Avoid Baclofen and avoid oral sodium phosphate  and magnesium citrate based laxatives / bowel preps.  Continue strict Input and Output monitoring. Will monitor the patient closely with you and intervene or adjust therapy as indicated by changes in clinical status/labs  Hypertension - BP actually on the lower side Failure to thrive - present for over a month AMS -MRI of the brain showed areas  of subcortical enhancement in the parietal occipital lobe seen by neurology, LP sent as well. Neurology treating for viral encephalitis empirically.  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  On IV acyclovir for 14 days and will need to be renally adjusted given AKI H/o prostate cancer H/o tobacco use as well  Fairy RONAL Sellar, MD [facility]

## 2024-09-15 ENCOUNTER — Inpatient Hospital Stay (HOSPITAL_COMMUNITY)

## 2024-09-15 DIAGNOSIS — R627 Adult failure to thrive: Secondary | ICD-10-CM | POA: Diagnosis not present

## 2024-09-15 LAB — CBC WITH DIFFERENTIAL/PLATELET
Abs Immature Granulocytes: 0.04 K/uL (ref 0.00–0.07)
Basophils Absolute: 0 K/uL (ref 0.0–0.1)
Basophils Relative: 0 %
Eosinophils Absolute: 0 K/uL (ref 0.0–0.5)
Eosinophils Relative: 0 %
HCT: 37.4 % — ABNORMAL LOW (ref 39.0–52.0)
Hemoglobin: 12.9 g/dL — ABNORMAL LOW (ref 13.0–17.0)
Immature Granulocytes: 1 %
Lymphocytes Relative: 6 %
Lymphs Abs: 0.5 K/uL — ABNORMAL LOW (ref 0.7–4.0)
MCH: 30.3 pg (ref 26.0–34.0)
MCHC: 34.5 g/dL (ref 30.0–36.0)
MCV: 87.8 fL (ref 80.0–100.0)
Monocytes Absolute: 0.1 K/uL (ref 0.1–1.0)
Monocytes Relative: 2 %
Neutro Abs: 7.3 K/uL (ref 1.7–7.7)
Neutrophils Relative %: 91 %
Platelets: 178 K/uL (ref 150–400)
RBC: 4.26 MIL/uL (ref 4.22–5.81)
RDW: 16.9 % — ABNORMAL HIGH (ref 11.5–15.5)
WBC: 7.9 K/uL (ref 4.0–10.5)
nRBC: 0 % (ref 0.0–0.2)

## 2024-09-15 LAB — RENAL FUNCTION PANEL
Albumin: 2.4 g/dL — ABNORMAL LOW (ref 3.5–5.0)
Anion gap: 12 (ref 5–15)
BUN: 14 mg/dL (ref 8–23)
CO2: 19 mmol/L — ABNORMAL LOW (ref 22–32)
Calcium: 7.8 mg/dL — ABNORMAL LOW (ref 8.9–10.3)
Chloride: 98 mmol/L (ref 98–111)
Creatinine, Ser: 0.79 mg/dL (ref 0.61–1.24)
GFR, Estimated: >60 mL/min (ref >60)
Glucose, Bld: 163 mg/dL — ABNORMAL HIGH (ref 70–99)
Phosphorus: 3.8 mg/dL (ref 2.5–4.6)
Potassium: 3.3 mmol/L — ABNORMAL LOW (ref 3.5–5.1)
Sodium: 129 mmol/L — ABNORMAL LOW (ref 135–145)

## 2024-09-15 MED ORDER — SODIUM CHLORIDE 0.9 % IV SOLN: INTRAVENOUS | Status: DC

## 2024-09-15 MED ORDER — OSMOLITE 1.5 CAL PO LIQD
1000.0000 mL | ORAL | Status: DC
  Administered 2024-09-15 – 2024-09-20 (×4): 1000 mL
  Filled 2024-09-15: qty 1000

## 2024-09-15 MED ORDER — TAMSULOSIN HCL 0.4 MG PO CAPS
0.4000 mg | ORAL_CAPSULE | Freq: Every day | ORAL | Status: DC
  Administered 2024-09-15 – 2024-09-20 (×6): 0.4 mg via ORAL
  Filled 2024-09-15 (×6): qty 1

## 2024-09-15 MED ORDER — DEXTROSE 5 % IV SOLN
10.0000 mg/kg | Freq: Two times a day (BID) | INTRAVENOUS | Status: DC
  Administered 2024-09-15 – 2024-09-17 (×6): 455 mg via INTRAVENOUS
  Filled 2024-09-15 (×8): qty 9.1

## 2024-09-15 MED ORDER — PANTOPRAZOLE SODIUM 40 MG PO TBEC
40.0000 mg | DELAYED_RELEASE_TABLET | Freq: Every day | ORAL | Status: DC
  Administered 2024-09-15 – 2024-09-21 (×3): 40 mg via ORAL
  Filled 2024-09-15 (×4): qty 1

## 2024-09-15 MED ORDER — PROSOURCE TF20 ENFIT COMPATIBL EN LIQD
60.0000 mL | Freq: Every day | ENTERAL | Status: DC
  Administered 2024-09-15 – 2024-09-21 (×5): 60 mL
  Filled 2024-09-15 (×5): qty 60

## 2024-09-15 MED ORDER — POTASSIUM CHLORIDE CRYS ER 20 MEQ PO TBCR
40.0000 meq | EXTENDED_RELEASE_TABLET | ORAL | Status: AC
  Administered 2024-09-15 (×2): 40 meq via ORAL
  Filled 2024-09-15 (×2): qty 2

## 2024-09-15 NOTE — Progress Notes (Signed)
 PHARMACY NOTE:  ANTIMICROBIAL RENAL DOSAGE ADJUSTMENT  Current antimicrobial regimen includes a mismatch between antimicrobial dosage and estimated renal function.  As per policy approved by the Pharmacy & Therapeutics and Medical Executive Committees, the antimicrobial dosage will be adjusted accordingly.  Current antimicrobial dosage:  Acyclovir 455 mg every 24 hours    Indication: VZV encephalitis   Renal Function:  Estimated Creatinine Clearance: 46.5 mL/min (by C-G formula based on SCr of 0.79 mg/dL). []      On intermittent HD, scheduled: []      On CRRT    Antimicrobial dosage has been changed to:  Acyclovir 455 mg IV every 12 hours   Additional comments:   Thank you for allowing pharmacy to be a part of this patient's care.  Damien Quiet, PharmD, BCPS, BCIDP Infectious Diseases Clinical Pharmacist Phone: [phone] 09/15/2024 8:27 AM

## 2024-09-15 NOTE — Plan of Care (Signed)
  Problem: Clinical Measurements: Goal: Ability to maintain clinical measurements within normal limits will improve Outcome: Progressing Goal: Will remain free from infection Outcome: Progressing Goal: Diagnostic test results will improve Outcome: Progressing Goal: Respiratory complications will improve Outcome: Progressing Goal: Cardiovascular complication will be avoided Outcome: Progressing   Problem: Activity: Goal: Risk for activity intolerance will decrease Outcome: Progressing   Problem: Nutrition: Goal: Adequate nutrition will be maintained Outcome: Progressing   Problem: Coping: Goal: Level of anxiety will decrease Outcome: Progressing   Problem: Elimination: Goal: Will not experience complications related to bowel motility Outcome: Progressing Goal: Will not experience complications related to urinary retention Outcome: Progressing   Problem: Pain Managment: Goal: General experience of comfort will improve and/or be controlled Outcome: Progressing   Problem: Safety: Goal: Ability to remain free from injury will improve Outcome: Progressing   Problem: Skin Integrity: Goal: Risk for impaired skin integrity will decrease Outcome: Progressing   Problem: Education: Goal: Knowledge of disease or condition will improve Outcome: Progressing   Problem: Ischemic Stroke/TIA Tissue Perfusion: Goal: Complications of ischemic stroke/TIA will be minimized Outcome: Progressing   Problem: Coping: Goal: Will verbalize positive feelings about self Outcome: Progressing Goal: Will identify appropriate support needs Outcome: Progressing   Problem: Health Behavior/Discharge Planning: Goal: Ability to manage health-related needs will improve Outcome: Progressing Goal: Goals will be collaboratively established with patient/family Outcome: Progressing   Problem: Self-Care: Goal: Ability to communicate needs accurately will improve Outcome: Progressing   Problem:  Nutrition: Goal: Risk of aspiration will decrease Outcome: Progressing

## 2024-09-15 NOTE — Progress Notes (Addendum)
 PROGRESS NOTE    Noah Burnett  FMW:[id number] DOB: 1941/04/27 DOA: 09/08/2024 PCP: [name], MD   Brief Narrative:  83 year old male with a history of prostate cancer (on observation), tobacco abuse, hypertension, GERD, duodenal mass resulting in gastric L obstruction status post robotic gastrojejunostomy 07/03/2024 presenting with decreased oral intake, generalized weakness, and confusion.   Patient had hospital admission at [facility] from 8/18 to 07/09/24 when he presented with nausea and vomiting.  He was found to have gastric outlet obstruction and duodenal mass. He had an EGD that showed an hiatal hernia and compression of the second portion of the duodenum with a possible intraluminal neoplasm. Biopsies were obtained which showed severely inflamed and ulcerated duodenal mucosa, but no evidence of malignancy. A second EGD was done with ultrasound guidance, but again pathology of the lesion extensive necrosis. Surgical oncology was consulted and he underwent a robotic gastrojejunostomy on 8/25. He did have a delayed return of bowel function and had a PICC line with TPN for several days until he had advancement of his diet. Since his hospitalization, he has had a fairly steady functional decline with decreasing appetite, significant weight loss, and generalized weakness.  After discharge, the patient was tolerating a diet and still driving and walking independently.   Unfortunately, the patient developed shingles in the last week of September 2025.  He went to see ophthalmology, and he was cleared from their standpoint.  He finished a course of Valtrex.  He continues to have some postherpetic neuralgia.  Daughter states that since his shingles episode, the patient has had a functional decline with decreasing appetite, significant weight loss, and generalized weakness.   The patient has followed up with surgical oncology on 08/31/2024.  At that time the patient had reported generalized  weakness and fatigue, but he did not have any vomiting or abdominal pain at that time.  He had poor p.o. intake and loss of appetite at that time.  Repeat MRI of the abdomen and pelvis was obtained.  This was performed on 09/06/2024 which showed no specific MR findings to suggest a duodenal mass.  There was Generalized edema-like signal in the retroperitoneum is presumed reactive in the setting of known duodenal ulceration.  There were multiple benign appearing hepatic lesions presumed to be cysts or hemangioma.  There was Bosniak 2 renal cysts.       The patient has not complained of any specific issues.  There has been no fevers, chills, headache, chest pain, shortness breath, nausea, vomiting, diarrhea, abdominal pain.  In fact he has been struggling with constipation. Daughter states that in the past week he has had increasing confusion and generalized weakness with continued decreased oral intake.   UA was negative for pyuria.  CT of the brain was negative for acute findings.  Acute abdominal series was negative for obstruction or pneumoperitoneum.   Neurology consulted for evaluation of acute encephalopathy. Family at bedside reports all his symptoms, generalized weakness, confusion,  dysarthria, lack of appetite started after he was diagnosed with shingles.  His hospital course complicated by acute on chronic hyponatremia. Nephrology consulted and he was started on salt tablets.    Therapy evaluations recommending CIR.     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  Differential include encephalitis versus vasculitis versus demyelinating disease vs stroke. Neurology consulted and patient was transferred from [facility]. Patient underwent fluoroscopic guided lumbar puncture and fluid sent for analysis for evaluation of meningitis and encephalitis. Procalcitonin is negative, CRP is 4.1. EEG showed diffuse cerebral dysfunction, no epileptiform discharges seen throughout the recording. VZVPCR is positive.  Varicella IgG negative.  Other workup negative result.  Early morning of 09/13/2024, patient noted to have left gaze preference and decreased responsiveness , code stroke was called, patient was seen by neurology.  Stat CT head was done which shows left superior convexity subarachnoid hemorrhage without mass effect.  Otherwise no changes from previous CT head.  CT angiogram ruled out large vessel occlusion.  Patient was kept n.p.o., SLP consulted.  Eventually MRI completed which showed Prominent patchy cortical and subcortical signal abnormality throughout both cerebral hemispheres, overall slightly progressed from 09/09/2024 and indeterminate although encephalitis and vasculitis remain leading considerations.  ID recommended continuing acyclovir.  Patient's mental status improved significantly on 09/14/2024.  ID and neuro both suspecting varicella encephalitis as likely etiology of patient's symptoms and recommended 14 days of total acyclovir.  Patient was also started on high-dose methylprednisolone by neurology 09/14/2024 with the plan to taper after that.  Chronic hyponatremia: Possibly from SIADH. Sodium in August, 2025 was 129.  Patient was admitted with a sodium of 126 on 10/21.TSH slightly elevated. Am cortisol 30. Serum osmo is 257, urine sodium is 137, urine osmolality is 490.  Nephrology on board.  Started on salt tablets.  Sodium fairly stable.  Acute urinary retention: On the evening of 09/13/2024,  patient was retaining urine, bladder scan showed 900 cc.  Orders were given to the nurses for straight cath however that was unsuccessful so coud catheter was placed.  Nephrology on board.  I am going to start this patient on Flomax.  AKI: Creatinine jumped from 0.76 to 1.66 to 2.07 yesterday.  Nephrology on board, given IV fluids, creatinine down to 0.8, AKI resolved today.    GERD/ Duodenal ulceration, stricture and mass s/p  robotic gastrojejunostomy on 8/25. Continue with PPI   Hypertension: On hydrochlorothiazide at home which is on hold, blood pressure controlled.   Hypokalemia: Low again, will replenish.  Hypomagnesemia: Resolved.  Recently diagnosed Shingles On Cyclivert.  GOC: Patient remains full code.  On 09/13/2024, I discussed with the son, he wants the patient to be full code however he told me that the siblings are having detailed conversation about the CODE STATUS and this might change.  Palliative is also following.   DVT prophylaxis: enoxaparin (LOVENOX) injection 30 mg Start: 09/09/24 2200   Code Status: Full Code  Family Communication: Daughter present at bedside.  Plan of care discussed with son and he was already aware about the plan by neurology.  Status is: Inpatient Remains inpatient appropriate because: Worsening creatinine, multiple medical issues.   Estimated body mass index is 15.68 kg/m as calculated from the following:   Height as of this encounter: 5' 7 (1.702 m).   Weight as of this encounter: 45.4 kg.    Nutritional Assessment: Body mass index is 15.68 kg/m.SABRA Seen by dietician.  I agree with the assessment and plan as outlined below: Nutrition Status: Nutrition Problem: Severe Malnutrition Etiology: chronic illness Signs/Symptoms: energy intake < or equal to 75% for >  or equal to 1 month, severe muscle depletion, severe fat depletion, percent weight loss Percent weight loss: 18 % (in 3 months) Interventions: MVI, Tube feeding,  Prostat  . Skin Assessment: I have examined the patient's skin and I agree with the wound assessment as performed by the wound care RN as outlined below:    Consultants:  Neurology  Procedures:  As above  Antimicrobials:  Anti-infectives (From admission, onward)    Start     Dose/Rate Route Frequency Ordered Stop   09/15/24 1000  acyclovir (ZOVIRAX) 455 mg in dextrose  5 % 100 mL IVPB        10 mg/kg  45.6 kg 109.1 mL/hr over 60 Minutes Intravenous Every 12 hours 09/15/24 0826     09/13/24 2200  acyclovir (ZOVIRAX) 455 mg in dextrose  5 % 100 mL IVPB  Status:  Discontinued        10 mg/kg  45.6 kg 109.1 mL/hr over 60 Minutes Intravenous Every 24 hours 09/13/24 1923 09/15/24 0826   09/13/24 1800  valACYclovir (VALTREX) tablet 1,000 mg  Status:  Discontinued        1,000 mg Oral Daily after supper 09/13/24 0846 09/13/24 1358   09/09/24 1500  acyclovir (ZOVIRAX) 455 mg in dextrose  5 % 100 mL IVPB  Status:  Discontinued        10 mg/kg  45.6 kg 109.1 mL/hr over 60 Minutes Intravenous Every 12 hours 09/09/24 1331 09/13/24 0846         Subjective: Patient seen and examined.  Sitting in the chair, eating his breakfast.  Daughter at the bedside as well as the nurse.  Patient more alert than yesterday.  Following commands, appears to have some weakness in the right upper and lower extremity.  Objective: Vitals:   09/14/24 1925 09/15/24 0400 09/15/24 0500 09/15/24 0816  BP: (!) 141/81 119/64  132/74  Pulse: 72 (!) 50  (!) 51  Resp: 18 16  18   Temp: 99 F (37.2 C) (!) 97.5 F (36.4 C)  98.3 F (36.8 C)  TempSrc: Oral Oral  Oral  SpO2: 96% 96%  99%  Weight:   45.4 kg   Height:        Intake/Output Summary (Last 24 hours) at 09/15/2024 0844 Last data filed at 09/15/2024 0000 Gross per 24 hour  Intake 2248.92 ml  Output 1750 ml  Net 498.92 ml   Filed Weights   09/08/24 1138 09/08/24 1626 09/15/24 0500  Weight: 51.2 kg 45.6 kg 45.4 kg    Examination:  General exam:  Appears calm and comfortable  Respiratory system: Clear to auscultation. Respiratory effort normal. Cardiovascular system: S1 & S2 heard, RRR. No JVD, murmurs, rubs, gallops or clicks. No pedal edema. Gastrointestinal system: Abdomen is nondistended, soft and nontender. No organomegaly or masses felt. Normal bowel sounds heard. Central nervous system: Alert and oriented.  Some weakness in right upper and lower extremity, some dysarthria.  Overall improving. Extremities: Symmetric 5 x 5 power. Skin: No rashes, lesions or ulcers.    Data Reviewed: I have personally reviewed following labs and imaging studies  CBC: Recent Labs  Lab 09/11/24 0258 09/12/24 0158 09/13/24 0155 09/14/24 0244 09/15/24 0141  WBC 6.1 11.2* 13.7* 11.6* 7.9  NEUTROABS 4.2 9.6* 12.1* 9.8* 7.3  HGB 13.4 15.5 15.4 13.1 12.9*  HCT 37.5* 43.6 43.7 36.8* 37.4*  MCV 85.4 84.7 85.7 85.0 87.8  PLT 183 234 227 198 178   Basic Metabolic Panel: Recent Labs  Lab 09/10/24 0255 09/11/24 0258  09/11/24 1829 09/12/24 0158 09/13/24 0155 09/14/24 0244 09/15/24 0141  NA 127* 125* 126* 125* 128* 129* 129*  K 4.1 3.6  --  3.0* 4.1 4.0 3.3*  CL 91* 93*  --  91* 93* 99 98  CO2 24 21*  --  20* 23 21* 19*  GLUCOSE 95 85  --  145* 147* 134* 163*  BUN 9 12  --  12 17 24* 14  CREATININE 0.73 0.67  --  0.76 1.61* 2.07* 0.79  CALCIUM 8.3* 7.9*  --  8.0* 8.1* 7.8* 7.8*  MG 1.7 1.8  --  1.7 2.5* 2.2  --   PHOS 3.6  --   --   --   --   --  3.8   GFR: Estimated Creatinine Clearance: 46.5 mL/min (by C-G formula based on SCr of 0.79 mg/dL). Liver Function Tests: Recent Labs  Lab 09/10/24 0255 09/11/24 0258 09/12/24 0158 09/13/24 0155 09/14/24 0244 09/15/24 0141  AST 14* 12* 21 23 19   --   ALT 11 9 14 12 14   --   ALKPHOS 65 58 66 63 56  --   BILITOT 1.1 1.6* 1.4* 1.0 1.0  --   PROT 5.6* 4.7* 5.6* 5.3* 4.6*  --   ALBUMIN 3.0* 2.6* 3.0* 3.0* 2.4* 2.4*   Recent Labs  Lab 09/08/24 1229  LIPASE 27   No results for  input(s): AMMONIA in the last 168 hours. Coagulation Profile: Recent Labs  Lab 09/10/24 0255  INR 1.0   Cardiac Enzymes: Recent Labs  Lab 09/10/24 0255  CKTOTAL 30*   BNP (last 3 results) No results for input(s): PROBNP in the last 8760 hours. HbA1C: No results for input(s): HGBA1C in the last 72 hours. CBG: Recent Labs  Lab 09/09/24 1201 09/09/24 1242 09/13/24 1000  GLUCAP 68* 141* 144*   Lipid Profile: No results for input(s): CHOL, HDL, LDLCALC, TRIG, CHOLHDL, LDLDIRECT in the last 72 hours. Thyroid Function Tests: No results for input(s): TSH, T4TOTAL, FREET4, T3FREE, THYROIDAB in the last 72 hours. Anemia Panel: No results for input(s): VITAMINB12, FOLATE, FERRITIN, TIBC, IRON, RETICCTPCT in the last 72 hours. Sepsis Labs: Recent Labs  Lab 09/11/24 0258 09/12/24 0158 09/13/24 0155 09/14/24 0244  PROCALCITON <0.10 <0.10 0.10 <0.10    Recent Results (from the past 240 hours)  CSF culture w Gram Stain     Status: None   Collection Time: 09/10/24  7:10 AM   Specimen: CSF; Cerebrospinal Fluid  Result Value Ref Range Status   Specimen Description CSF  Final   Special Requests NONE  Final   Gram Stain   Final    WBC PRESENT, PREDOMINANTLY MONONUCLEAR NO ORGANISMS SEEN CYTOSPIN SMEAR    Culture   Final    NO GROWTH 3 DAYS Performed at [facility], [address] [place], [place] [address]    Report Status 09/13/2024 FINAL  Final  VZV PCR, CSF     Status: None   Collection Time: 09/11/24  8:39 AM   Specimen: Cerebrospinal Fluid  Result Value Ref Range Status   VZV PCR, CSF Negative Negative Final    Comment: (NOTE) No Varicella Zoster Virus DNA detected. Performed At: [facility] [address] [place], [place] [id number] Jennette Shorter MD Ey:[id number]   Varicella-zoster by PCR     Status: Abnormal   Collection Time: 09/11/24 11:50 AM   Specimen: Blood  Result Value Ref Range Status    Varicella-Zoster, PCR Positive (A) Negative Final  Comment: (NOTE) Varicella Zoster Virus DNA detected. This test was developed and its performance characteristics determined by [facility]. It has not been cleared or approved by the Food and Drug Administration. Performed At: [facility] [address] [place], [place] [id number] Jennette Shorter MD Ey:[id number]      Radiology Studies: MR BRAIN WO CONTRAST Result Date: 09/13/2024 EXAM: MRI BRAIN WITHOUT CONTRAST 09/13/2024 04:16:00 PM TECHNIQUE: Multiplanar multisequence MRI of the head/brain was performed without the administration of intravenous contrast. COMPARISON: Head CT and CTA 09/13/2024 and MRI 09/09/2024. CLINICAL HISTORY: Neuro deficit, acute, stroke suspected. FINDINGS: The examination is intermittently up to moderately motion degraded. BRAIN AND VENTRICLES: Numerous patchy areas of cortical and subcortical T2 hyperintensity and diffusion weighted signal hyperintensity are again seen scattered throughout both cerebral hemispheres. Some areas appear mildly progressive on diffusion weighted imaging, such as small regions of restricted diffusion involving cortex and subcortical white matter in the posterior left frontal lobe and left corona radiata, while the signal changes on T2 and FLAIR sequences are stable to at most minimally increased. Gyral intrinsic T1 hyperintensity is again noted along the right parietooccipital sulcus. A small amount of susceptibility within the left postcentral sulcus corresponds to subarachnoid hemorrhage which is new from the prior MRI but was seen on today's earlier CT. There is mild to moderate cerebral atrophy. Background chronic small vessel ischemia is noted in the cerebral white matter. No midline shift, hydrocephalus, or extra axial fluid collection is identified. Major intracranial vascular flow voids are preserved. ORBITS: Bilateral cataract extraction. SINUSES AND MASTOIDS: No acute abnormality.  BONES AND SOFT TISSUES: Normal marrow signal. No acute soft tissue abnormality. IMPRESSION: 1. Prominent patchy cortical and subcortical signal abnormality throughout both cerebral hemispheres, overall slightly progressed from 09/09/2024 and indeterminate although encephalitis and vasculitis remain leading considerations. 2. Small volume left parietal subarachnoid hemorrhage. Electronically signed by: Dasie Hamburg MD 09/13/2024 04:35 PM EST RP Workstation: [id number]   EEG adult Result Date: 09/13/2024 Shelton Arlin KIDD, MD     09/13/2024  2:58 PM Patient Name: TABOR DENHAM MRN: [id number] Epilepsy Attending: Arlin KIDD Shelton Referring Physician/Provider: Remi Pippin, NP Date: 09/13/2024 Duration: 24.05 mins Patient history: 83 year old male with altered mental status.  EEG evaluate for seizure. Level of alertness: Awake AEDs during EEG study: None Technical aspects: This EEG study was done with scalp electrodes positioned according to the 10-20 International system of electrode placement. Electrical activity was reviewed with band pass filter of 1-70Hz , sensitivity of 7 uV/mm, display speed of 31mm/sec with a 60Hz  notched filter applied as appropriate. EEG data were recorded continuously and digitally stored.  Video monitoring was available and reviewed as appropriate. Description: EEG showed continuous generalized 3 to 6 Hz theta-delta slowing admixed with 12-15hz  beta activity. Hyperventilation and photic stimulation were not performed.   ABNORMALITY - Continuous slow, generalized IMPRESSION: This study is suggestive of generalized cerebral dysfunction (encephalopathy). No seizures or epileptiform discharges were seen throughout the recording. Priyanka KIDD Shelton   CT ANGIO HEAD NECK W WO CM (CODE STROKE) Result Date: 09/13/2024 EXAM: CTA HEAD AND NECK WITHOUT AND WITH 09/13/2024 10:29:28 AM TECHNIQUE: CTA of the head and neck was performed without and with the administration of 75 mL of iohexol  (OMNIPAQUE )  350 MG/ML injection. Multiplanar 2D and/or 3D reformatted images are provided for review. Automated exposure control, iterative reconstruction, and/or weight based adjustment of the mA/kV was utilized to reduce the radiation dose to as low as reasonably achievable. Stenosis of the internal carotid arteries measured using  NASCET criteria. COMPARISON: Code stroke head CT 09/13/2024 (reported separately), recent CTA head and neck 09/10/2024. CLINICAL HISTORY: 83 year old male with acute neuro deficit, stroke suspected. FINDINGS: CTA NECK: AORTIC ARCH AND ARCH VESSELS: Tortuous aortic arch with mild forage calcified atherosclerosis. 3 vessel arch configuration. No dissection or arterial injury. No significant stenosis of the brachiocephalic or subclavian arteries. Proximal subclavian arteries are stable with tortuosity, no stenosis. CERVICAL CAROTID ARTERIES: Right carotid artery remains patent to the skull base with stable atherosclerosis at the right ICA origin less than 50% stenosis. Left carotid artery remains patent to the skull base with stable atherosclerosis at the left ICA origin and bulb, narrowing the vessel to 1.9 mm versus 5 mm normally, resulting in 62% stenosis. No dissection or arterial injury. CERVICAL VERTEBRAL ARTERIES: Dominant left vertebral artery is patent and normal to the skull base. Nondominant right vertebral artery is patent, with stable mild stenosis at its origin. Stable mild irregularity and stenosis of the dominant left vertebral artery as it crosses the dura, mild left V4 irregularity. Patent left PICA origin. The left vertebral primarily supplies the basilar artery as before. Distal right vertebral artery is stable with mild irregularity and stenosis, patent right PICA origin. No dissection or arterial injury. LUNGS AND MEDIASTINUM: Centrilobular emphysema. Negative visible superior mediastinum. SOFT TISSUES: Negative nonvascular neck soft tissue spaces. No acute abnormality. BONES:  Absent dentition. Exaggerated cervical lordosis and upper thoracic kyphosis. No acute abnormality. CTA HEAD: ANTERIOR CIRCULATION: Bilateral ICA siphons remain patent. Left siphon moderate calcified plaque and small supraclinoid left ICA 2 to 3 mm aneurysm (series 15 image 109) are stable. Only mild left siphon stenosis. Right ICA siphon moderate calcified plaque and mild to moderate supraclinoid right ICA stenosis are stable. MCA and ACA origins remain patent. Nondominant left A1 segment redemonstrated. Dominant left ACA A2 segment redemonstrated. Bilateral ACA branches are stable. Left MCA M1 segment and bifurcation remain patent without stenosis. Right MCA M1 segment and bifurcation remain patent without stenosis. Bilateral MCA branches are stable. No other aneurysm identified in the anterior circulation. POSTERIOR CIRCULATION: Basilar artery remains patent. SCA and PCA origins are patent. Bilateral PCA branches remain patent, mild to moderate left P2 segment irregularity does not appear significantly changed. No other aneurysm identified in the posterior circulation. OTHER: Early venous contrast timing on this exam, major dural venous sinuses appear grossly patent. Preliminary Report of this exam discussed by telephone with Neurology provider [facility] at 1035 hours. IMPRESSION: 1. Stable CTA head and neck since 09/10/24 with no large vessel occlusion. 2. Stable atherosclerosis at the left ICA origin and bulb with 62% stenosis. 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 And stable less pronounced atherosclerosis elsewhere. 3. Emphysema. Electronically signed by: Helayne Hurst MD 09/13/2024 10:45 AM EST RP Workstation: [id number]   CT HEAD CODE STROKE WO CONTRAST Result Date: 09/13/2024 EXAM: CT HEAD WITHOUT CONTRAST 09/13/2024 10:14:31 AM TECHNIQUE: CT of the head was performed without the administration of intravenous contrast. Automated exposure control, iterative reconstruction, and/or weight based  adjustment of the mA/kV was utilized to reduce the radiation dose to as low as reasonably achievable. COMPARISON: Brain MRI 09/09/2024, Head CT 09/10/2024. CLINICAL HISTORY: 83 year old male with acute neuro deficit, stroke suspected. FINDINGS: BRAIN AND VENTRICLES: Trace new left superior convexity subarachnoid blood, 1 to 2 sulci affected and best seen on series 6 image 39 sagittal images. No progressive edema there, no mass effect. No other acute intracranial hemorrhage. No convincing cortically based infarct by  CT. No hydrocephalus. No extra-axial collection. No mass effect or midline shift. Stable brain volume. Patchy and confluent, mild to moderate periventricular white matter hypodensity has not significantly changed. Asymmetric right superior temporal gyrus white matter hypodensity also stable to recent exams. Calcified atherosclerosis. No suspicious intracranial vascular hyperdensity. ORBITS: No acute abnormality. Mild leftward gaze. SINUSES: Paranasal sinuses, middle ears and mastoids remain well aerated. SOFT TISSUES AND SKULL: No acute soft tissue abnormality. No skull fracture. [place] stroke program early CT score (aspects) ----- Ganglionic (caudate, ic, lentiform nucleus, insula, M1-m3): 7 Supraganglionic (m4-m6): 3 Total: 10 IMPRESSION: 1. Trace new left superior convexity subarachnoid hemorrhage (12 sulci), without mass effect. 2. Otherwise stable non contrast CT appearance of the brain: Nonspecific white matter changes, No convincing cortically based infarct by CT. ASPECTS 10. 3. These results were communicated to Dr. Lindzen at 10:23 hours on 09/13/2024 by text page via the [facility] messaging system. Electronically signed by: Helayne Hurst MD 09/13/2024 10:26 AM EST RP Workstation: [id number]    Scheduled Meds:  Chlorhexidine Gluconate Cloth  6 each Topical Daily   enoxaparin (LOVENOX) injection  30 mg Subcutaneous Q24H   feeding supplement ([employer] STANDARD ENT 1.4)  325 mL Oral BID BM    lidocaine   1 Application Urethral Once   multivitamin with minerals  1 tablet Oral Daily   pantoprazole   40 mg Oral BID   polyethylene glycol  17 g Oral Daily   potassium chloride  40 mEq Oral Q4H   [START ON 09/19/2024] predniSONE  50 mg Oral Q breakfast   rosuvastatin  10 mg Oral Daily   senna  2 tablet Oral Daily   sodium chloride   1 g Oral TID WC   thiamine  100 mg Oral Daily   vitamin B-12  500 mcg Oral Daily   Continuous Infusions:  sodium chloride  Stopped (09/14/24 2338)   acyclovir (ZOVIRAX) 455 mg in dextrose  5 % 100 mL IVPB     methylPREDNISolone (SOLU-MEDROL) injection Stopped (09/14/24 1831)     LOS: 7 days   Fredia Skeeter, MD [facility]  09/15/2024, 8:44 AM   *Please note that this is a verbal dictation therefore any spelling or grammatical errors are due to the Dragon Medical One system interpretation.  Please page via Amion and do not message via secure chat for urgent patient care matters. Secure chat can be used for non urgent patient care matters.  How to contact the [facility] Attending or Consulting provider 7A - 7P or covering provider during after hours 7P -7A, for this patient?  Check the care team in [facility] and look for a) attending/consulting TR[facility] provider listed and b) the [facility] team listed. Page or secure chat 7A-7P. Log into [email or web address] and use [facility]'s universal password to access. If you do not have the password, please contact the hospital operator. Locate the [facility] provider you are looking for under [facility] and page to a number that you can be directly reached. If you still have difficulty reaching the provider, please page the [facility] (Director on Call) for the Hospitalists listed on amion for assistance.

## 2024-09-15 NOTE — Progress Notes (Signed)
 Occupational Therapy Treatment Patient Details Name: Noah Burnett MRN: [id number] DOB: September 30, 1941 Today's Date: 09/15/2024   History of present illness 83 y.o. male presents to [facility] 09/08/24 after unwitnessed fall and AMS. Admitted with failure to thrive after multiple GI procedures. MRI brain concerning for multiple lesions with differentials that include encephalitis versus infectious etiology versus embolic infarcts. EEG showed encephalopathy. 11/5 code stroke due to L gaze preference and decreased LOA, however, no changes from prior CT head. PMHx: HTN, prediabetes, GERD, gastric outlet obstruction w/ history of bypass gastrojejunostomy   OT comments  Patient demonstrating good gains with OT treatment with bed mobility, transfers, and self care. Patient with limited standing tolerance with self care and complete 75% seated.  Patient able to reach feet to doff socks but required max assist to perform.  Patient in chair at end of session and required setup for breakfast.  Patient's daughter present during session and supportive and assists with encouraging patient.  Patient will benefit from intensive inpatient follow-up therapy, >3 hours/day.  Acute OT to continue to follow to address established goals to facilitate DC to next venue of care.        If plan is discharge home, recommend the following:  A little help with walking and/or transfers;A lot of help with bathing/dressing/bathroom;Direct supervision/assist for financial management;Assistance with cooking/housework;Direct supervision/assist for medications management;Help with stairs or ramp for entrance;Supervision due to cognitive status   Equipment Recommendations  Other (comment) (defer)    Recommendations for Other Services      Precautions / Restrictions Precautions Precautions: Fall Recall of Precautions/Restrictions: Impaired       Mobility Bed Mobility Overal bed mobility: Needs Assistance Bed Mobility: Supine to  Sit     Supine to sit: Min assist, Used rails     General bed mobility comments: increased time and min assist    Transfers Overall transfer level: Needs assistance Equipment used: Rolling walker (2 wheels) Transfers: Sit to/from Stand Sit to Stand: Min assist           General transfer comment: min assist to stand and to steady while standing. cues for hand placement     Balance Overall balance assessment: Needs assistance Sitting-balance support: Feet supported Sitting balance-Leahy Scale: Fair Sitting balance - Comments: EOB   Standing balance support: Single extremity supported, Bilateral upper extremity supported, During functional activity Standing balance-Leahy Scale: Poor Standing balance comment: reliant on external support for standing balance                           ADL either performed or assessed with clinical judgement   ADL Overall ADL's : Needs assistance/impaired Eating/Feeding: Set up;Sitting Eating/Feeding Details (indicate cue type and reason): assistance opening containers Grooming: Wash/dry hands;Wash/dry face;Oral care;Moderate assistance Grooming Details (indicate cue type and reason): stood at sink with min assist and required mod assist to complete dentures care Upper Body Bathing: Minimal assistance;Sitting Upper Body Bathing Details (indicate cue type and reason): at sink Lower Body Bathing: Moderate assistance;Sit to/from stand Lower Body Bathing Details (indicate cue type and reason): assistance with peri area bathing while standing Upper Body Dressing : Minimal assistance;Sitting Upper Body Dressing Details (indicate cue type and reason): gown change Lower Body Dressing: Maximal assistance;Sitting/lateral leans Lower Body Dressing Details (indicate cue type and reason): changed socks with patient assisting with doffing               General ADL Comments: self care performed at  sink with cues for sequencing     Extremity/Trunk Assessment              Vision       Perception     Praxis     Communication Communication Communication: Impaired Factors Affecting Communication: Reduced clarity of speech;Difficulty expressing self;Hearing impaired   Cognition Arousal: Alert Behavior During Therapy: Flat affect Cognition: Cognition impaired   Orientation impairments: Time Awareness: Online awareness impaired Memory impairment (select all impairments): Short-term memory Attention impairment (select first level of impairment): Sustained attention Executive functioning impairment (select all impairments): Initiation, Sequencing, Problem solving, Organization                   Following commands: Impaired Following commands impaired: Follows one step commands inconsistently      Cueing   Cueing Techniques: Verbal cues, Tactile cues, Visual cues  Exercises      Shoulder Instructions       General Comments VSS on RA    Pertinent Vitals/ Pain       Pain Assessment Pain Assessment: Faces Faces Pain Scale: Hurts a little bit Pain Location: Generalized Pain Descriptors / Indicators: Grimacing Pain Intervention(s): Monitored during session, Repositioned  Home Living                                          Prior Functioning/Environment              Frequency  Min 2X/week        Progress Toward Goals  OT Goals(current goals can now be found in the care plan section)  Progress towards OT goals: Progressing toward goals  Acute Rehab OT Goals Patient Stated Goal: to eat breakfast OT Goal Formulation: With patient Time For Goal Achievement: 09/24/24 Potential to Achieve Goals: Good ADL Goals Pt Will Perform Upper Body Dressing: sitting;with contact guard assist Pt Will Perform Lower Body Dressing: with contact guard assist;sitting/lateral leans;sit to/from stand Pt Will Transfer to Toilet: with contact guard assist;ambulating;regular  height toilet Additional ADL Goal #1: pt will follow single step command with 75% accuracy in prep for ADLs  Plan      Co-evaluation                 AM-PAC OT 6 Clicks Daily Activity     Outcome Measure   Help from another person eating meals?: A Little Help from another person taking care of personal grooming?: A Lot Help from another person toileting, which includes using toliet, bedpan, or urinal?: A Lot Help from another person bathing (including washing, rinsing, drying)?: A Lot Help from another person to put on and taking off regular upper body clothing?: A Little Help from another person to put on and taking off regular lower body clothing?: A Lot 6 Click Score: 14    End of Session Equipment Utilized During Treatment: Gait belt;Rolling walker (2 wheels)  OT Visit Diagnosis: Unsteadiness on feet (R26.81);Other abnormalities of gait and mobility (R26.89);Muscle weakness (generalized) (M62.81)   Activity Tolerance Patient tolerated treatment well   Patient Left in chair;with call bell/phone within reach;with chair alarm set;with family/visitor present   Nurse Communication Mobility status        Time: 9171-9140 OT Time Calculation (min): 31 min  Charges: OT General Charges $OT Visit: 1 Visit OT Treatments $Self Care/Home Management : 23-37 mins  Dick Laine, OTA Acute Rehabilitation Services  Office [phone]   Jeb LITTIE Laine 09/15/2024, 12:08 PM

## 2024-09-15 NOTE — Progress Notes (Signed)
 Patient ID: Noah Burnett, male   DOB: [birth year], 83 y.o.   MRN: [id number] S:Family at bedside and report more coherent and engaging today O:BP (!) 140/75 (BP Location: Left Arm)   Pulse 61   Temp 98.1 F (36.7 C) (Oral)   Resp 20   Ht 5' 7 (1.702 m)   Wt 45.4 kg   SpO2 99%   BMI 15.68 kg/m   Intake/Output Summary (Last 24 hours) at 09/15/2024 1309 Last data filed at 09/15/2024 1003 Gross per 24 hour  Intake 2368.92 ml  Output 1750 ml  Net 618.92 ml   Intake/Output: I/O last 3 completed shifts: In: 3652.8 [P.O.:360; I.V.:3079; IV Piggyback:213.8] Out: 3750 [Urine:3750]  Intake/Output this shift:  Total I/O In: 240 [P.O.:240] Out: -  Weight change:  Gen: NAD CVS: bradycardic Resp: CTA Abd: +BS, soft, NT/ND Ext: no edema  Recent Labs  Lab 09/09/24 0333 09/10/24 0255 09/11/24 0258 09/11/24 1829 09/12/24 0158 09/13/24 0155 09/14/24 0244 09/15/24 0141  NA 128* 127* 125* 126* 125* 128* 129* 129*  K 3.7 4.1 3.6  --  3.0* 4.1 4.0 3.3*  CL 92* 91* 93*  --  91* 93* 99 98  CO2 22 24 21*  --  20* 23 21* 19*  GLUCOSE 81 95 85  --  145* 147* 134* 163*  BUN 9 9 12   --  12 17 24* 14  CREATININE 0.67 0.73 0.67  --  0.76 1.61* 2.07* 0.79  ALBUMIN  --  3.0* 2.6*  --  3.0* 3.0* 2.4* 2.4*  CALCIUM 8.6* 8.3* 7.9*  --  8.0* 8.1* 7.8* 7.8*  PHOS  --  3.6  --   --   --   --   --  3.8  AST  --  14* 12*  --  21 23 19   --   ALT  --  11 9  --  14 12 14   --    Liver Function Tests: Recent Labs  Lab 09/12/24 0158 09/13/24 0155 09/14/24 0244 09/15/24 0141  AST 21 23 19   --   ALT 14 12 14   --   ALKPHOS 66 63 56  --   BILITOT 1.4* 1.0 1.0  --   PROT 5.6* 5.3* 4.6*  --   ALBUMIN 3.0* 3.0* 2.4* 2.4*   No results for input(s): LIPASE, AMYLASE in the last 168 hours.  No results for input(s): AMMONIA in the last 168 hours. CBC: Recent Labs  Lab 09/11/24 0258 09/12/24 0158 09/13/24 0155 09/14/24 0244 09/15/24 0141  WBC 6.1 11.2* 13.7* 11.6* 7.9  NEUTROABS 4.2 9.6*  12.1* 9.8* 7.3  HGB 13.4 15.5 15.4 13.1 12.9*  HCT 37.5* 43.6 43.7 36.8* 37.4*  MCV 85.4 84.7 85.7 85.0 87.8  PLT 183 234 227 198 178   Cardiac Enzymes: Recent Labs  Lab 09/10/24 0255  CKTOTAL 30*   CBG: Recent Labs  Lab 09/09/24 1201 09/09/24 1242 09/13/24 1000  GLUCAP 68* 141* 144*    Iron Studies: No results for input(s): IRON, TIBC, TRANSFERRIN, FERRITIN in the last 72 hours. Studies/Results: DG Abd Portable 1V Result Date: 09/15/2024 EXAM: 1 VIEW XRAY OF THE ABDOMEN 09/15/2024 11:40:00 AM COMPARISON: 7 days ago. Feeding tube tip is seen in proximal stomach. CLINICAL HISTORY: Encounter for feeding tube placement. FINDINGS: LINES, TUBES AND DEVICES: Feeding tube tip is seen in proximal stomach. BOWEL: Nonobstructive bowel gas pattern. SOFT TISSUES: No opaque urinary calculi. BONES: No acute osseous abnormality. IMPRESSION: 1. Feeding tube tip in the proximal stomach.  Electronically signed by: Lynwood Seip MD 09/15/2024 12:47 PM EST RP Workstation: [id number]   MR BRAIN WO CONTRAST Result Date: 09/13/2024 EXAM: MRI BRAIN WITHOUT CONTRAST 09/13/2024 04:16:00 PM TECHNIQUE: Multiplanar multisequence MRI of the head/brain was performed without the administration of intravenous contrast. COMPARISON: Head CT and CTA 09/13/2024 and MRI 09/09/2024. CLINICAL HISTORY: Neuro deficit, acute, stroke suspected. FINDINGS: The examination is intermittently up to moderately motion degraded. BRAIN AND VENTRICLES: Numerous patchy areas of cortical and subcortical T2 hyperintensity and diffusion weighted signal hyperintensity are again seen scattered throughout both cerebral hemispheres. Some areas appear mildly progressive on diffusion weighted imaging, such as small regions of restricted diffusion involving cortex and subcortical white matter in the posterior left frontal lobe and left corona radiata, while the signal changes on T2 and FLAIR sequences are stable to at most minimally increased.  Gyral intrinsic T1 hyperintensity is again noted along the right parietooccipital sulcus. A small amount of susceptibility within the left postcentral sulcus corresponds to subarachnoid hemorrhage which is new from the prior MRI but was seen on today's earlier CT. There is mild to moderate cerebral atrophy. Background chronic small vessel ischemia is noted in the cerebral white matter. No midline shift, hydrocephalus, or extra axial fluid collection is identified. Major intracranial vascular flow voids are preserved. ORBITS: Bilateral cataract extraction. SINUSES AND MASTOIDS: No acute abnormality. BONES AND SOFT TISSUES: Normal marrow signal. No acute soft tissue abnormality. IMPRESSION: 1. Prominent patchy cortical and subcortical signal abnormality throughout both cerebral hemispheres, overall slightly progressed from 09/09/2024 and indeterminate although encephalitis and vasculitis remain leading considerations. 2. Small volume left parietal subarachnoid hemorrhage. Electronically signed by: Dasie Hamburg MD 09/13/2024 04:35 PM EST RP Workstation: [id number]    Chlorhexidine Gluconate Cloth  6 each Topical Daily   enoxaparin (LOVENOX) injection  30 mg Subcutaneous Q24H   feeding supplement (KATE FARMS STANDARD ENT 1.4)  325 mL Oral BID BM   feeding supplement (PROSource TF20)  60 mL Per Tube Daily   lidocaine   1 Application Urethral Once   multivitamin with minerals  1 tablet Oral Daily   pantoprazole   40 mg Oral BID   polyethylene glycol  17 g Oral Daily   potassium chloride  40 mEq Oral Q4H   [START ON 09/19/2024] predniSONE  50 mg Oral Q breakfast   rosuvastatin  10 mg Oral Daily   senna  2 tablet Oral Daily   sodium chloride   1 g Oral TID WC   tamsulosin  0.4 mg Oral QPC supper   thiamine  100 mg Oral Daily   vitamin B-12  500 mcg Oral Daily    BMET    Component Value Date/Time   NA 129 (L) 09/15/2024 0141   K 3.3 (L) 09/15/2024 0141   CL 98 09/15/2024 0141   CO2 19 (L) 09/15/2024  0141   GLUCOSE 163 (H) 09/15/2024 0141   BUN 14 09/15/2024 0141   CREATININE 0.79 09/15/2024 0141   CALCIUM 7.8 (L) 09/15/2024 0141   GFRNONAA >60 09/15/2024 0141   GFRAA >60 03/27/2018 1003   CBC    Component Value Date/Time   WBC 7.9 09/15/2024 0141   RBC 4.26 09/15/2024 0141   HGB 12.9 (L) 09/15/2024 0141   HCT 37.4 (L) 09/15/2024 0141   PLT 178 09/15/2024 0141   MCV 87.8 09/15/2024 0141   MCH 30.3 09/15/2024 0141   MCHC 34.5 09/15/2024 0141   RDW 16.9 (H) 09/15/2024 0141   LYMPHSABS 0.5 (L) 09/15/2024 0141   MONOABS 0.1  09/15/2024 0141   EOSABS 0.0 09/15/2024 0141   BASOSABS 0.0 09/15/2024 0141    Noah Burnett is an 83 y.o. 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  He had a robotic gastrojejunostomy on 8/25 requiring TPN via PICC subsequently.  Patient reportedly has had decreased appetite, weight loss and inability to walk.  Reportedly repeat MRI which did not show the duodenal mass.  Patient brought in for failure to thrive as well as a unwitnessed fall. CT head was negative for large vessel occlusion, no e/o vasculitis, no other acute intracranial abnormality. Denies any NSAID use but he has not been eating much at all for at least a month.    Assessment/Plan:   Hyponatremia -stable around 129.  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  Continue with solute favoring protein over sodium chloride .  Gentle fluid restriction.  SIADH physiology might be improving as encephalitis is treated.  Now on IV fluids with acyclovir, will ne  New onset AKI -likely was obstructive and may be some contrast exposure.  Now with Foley catheter with good urine output and normalization of GFR. Hypertension -stable, no active  issue Failure to thrive - present for over a month VZV encephalitis ID following H/o prostate cancer H/o tobacco use as well  Noah KATHEE Gasman, MD  [facility]  Will sign off for now.  Please call with any questions or concerns.  Pt does not need follow up with nephrology upon discharge.  PCP can refer as needed.

## 2024-09-15 NOTE — Progress Notes (Signed)
 Inpatient Rehab Admissions Coordinator:    CIR following. Case pending with [employer] appeal. I'm hopeful for a decision today.   Leita Kleine, MS, CCC-SLP Rehab Admissions Coordinator  [phone] (celll) [phone] (office)

## 2024-09-15 NOTE — Procedures (Signed)
 Cortrak  Person Inserting Tube:  Duan Scharnhorst T, RD Tube Type:  Cortrak - 43 inches Tube Size:  10 Tube Location:  Left nare Secured by: Bridle Technique Used to Measure Tube Placement:  Marking at nare/corner of mouth Cortrak Secured At:  74 cm Initial Placement Verification:  Xray   Cortrak Tube Team Note:  Consult received to place a Cortrak feeding tube.   X-ray has been ordered by the Cortrak team. Please confirm placement prior to using tube.   If the tube becomes dislodged please keep the tube and contact the Cortrak team at [email or web address] for replacement.  If after hours and replacement cannot be delayed, place a NG tube and confirm placement with an abdominal x-ray.    Trude Ned RD, LDN Contact via [employer].

## 2024-09-15 NOTE — Plan of Care (Signed)
  Problem: Clinical Measurements: Goal: Will remain free from infection Outcome: Progressing Goal: Respiratory complications will improve Outcome: Progressing Goal: Cardiovascular complication will be avoided Outcome: Progressing   Problem: Pain Managment: Goal: General experience of comfort will improve and/or be controlled Outcome: Progressing

## 2024-09-15 NOTE — Progress Notes (Signed)
 Osmolite 1.5 not yet started. No kangaroo pump has been located. There are none on the unit, equipment cannot locate one, and we have called another unit who also does not have one.

## 2024-09-15 NOTE — Progress Notes (Signed)
 NEUROLOGY CONSULT FOLLOW UP NOTE   Date of service: September 15, 2024 Patient Name: Noah Burnett MRN:  [id number] DOB:  1941/02/17  Interval Hx/subjective  Patient is seen in his room with 1 family member at the bedside.  He is working with occupational therapy and is more alert today.  He is oriented to person and place but states the month is October.  Vitals   Vitals:   09/14/24 1925 09/15/24 0400 09/15/24 0500 09/15/24 0816  BP: (!) 141/81 119/64  132/74  Pulse: 72 (!) 50  (!) 51  Resp: 18 16  18   Temp: 99 F (37.2 C) (!) 97.5 F (36.4 C)  98.3 F (36.8 C)  TempSrc: Oral Oral  Oral  SpO2: 96% 96%  99%  Weight:   45.4 kg   Height:         Body mass index is 15.68 kg/m.  Physical Exam   General: Well-nourished, well-developed elderly patient  HEENT: Normocephalic, atraumatic Lungs: Respirations regular and unlabored on room air CV: Regular rate and rhythm on the monitor   NEURO:  Mental Status:  Speech is dysarthric. Follows simple and two-step commands consistently.  Able to state his full name, states he is in the hospital and states the month is October (when he came into the hospital) Cranial Nerves:  II: PERRL.  III, IV, VI: EOMI, eyelids elevate symmetrically. V: Sensation is intact to light touch and symmetrical VII: Slight right facial droop VIII: Hearing intact to voice. IX, X: Mildly dysarthric Motor: Able to move all 4 extremities with symmetrical antigravity strength Tone is normal and bulk is normal Sensation- Intact to light touch bilaterally.  Coordination: FTN intact bilaterally Gait- Deferred  Medications  Current Facility-Administered Medications:    0.9 %  sodium chloride  infusion, , Intravenous, Continuous, Gretel Prentice BIRCH, RPH   acetaminophen  (TYLENOL ) tablet 650 mg, 650 mg, Oral, Q6H PRN, Tat, David, MD   acyclovir (ZOVIRAX) 455 mg in dextrose  5 % 100 mL IVPB, 10 mg/kg, Intravenous, Q12H, Sinclair, Emily S, RPH   Chlorhexidine  Gluconate Cloth 2 % PADS 6 each, 6 each, Topical, Daily, Rathore, Vasundhra, MD, 6 each at 09/14/24 0912   enoxaparin (LOVENOX) injection 30 mg, 30 mg, Subcutaneous, Q24H, Tat, Alm, MD, 30 mg at 09/14/24 2338   feeding supplement ([employer] STANDARD ENT 1.4) liquid 325 mL, 325 mL, Oral, BID BM, Pahwani, Ravi, MD, 325 mL at 09/14/24 1757   lidocaine  (XYLOCAINE ) 2 % jelly 1 Application, 1 Application, Urethral, Once, Vernon Ranks, MD   loperamide (IMODIUM) capsule 2 mg, 2 mg, Oral, PRN, Howerter, Justin B, DO, 2 mg at 09/11/24 2328   methylPREDNISolone sodium succinate (SOLU-MEDROL) 1,000 mg in sodium chloride  0.9 % 50 mL IVPB, 1,000 mg, Intravenous, Q24H, Matias Thurman, MD, Paused at 09/14/24 1831   multivitamin with minerals tablet 1 tablet, 1 tablet, Oral, Daily, Tat, David, MD, 1 tablet at 09/14/24 9087   ondansetron  (ZOFRAN ) tablet 4 mg, 4 mg, Oral, Q6H PRN **OR** ondansetron  (ZOFRAN ) injection 4 mg, 4 mg, Intravenous, Q6H PRN, Tat, David, MD   pantoprazole  (PROTONIX ) EC tablet 40 mg, 40 mg, Oral, BID, Akula, Vijaya, MD, 40 mg at 09/14/24 2339   polyethylene glycol (MIRALAX / GLYCOLAX) packet 17 g, 17 g, Oral, Daily, Tat, David, MD, 17 g at 09/14/24 0911   potassium chloride SA (KLOR-CON M) CR tablet 40 mEq, 40 mEq, Oral, Q4H, Pahwani, Ravi, MD   [START ON 09/19/2024] predniSONE (DELTASONE) tablet 50 mg, 50 mg, Oral, Q  breakfast, Ausencio Vaden, MD   rosuvastatin (CRESTOR) tablet 10 mg, 10 mg, Oral, Daily, Arora, Ashish, MD, 10 mg at 09/14/24 0912   senna (SENOKOT) tablet 17.2 mg, 2 tablet, Oral, Daily, Tat, David, MD, 17.2 mg at 09/14/24 0912   sodium chloride  tablet 1 g, 1 g, Oral, TID WC, Melia Lynwood ORN, MD, 1 g at 09/14/24 1731   thiamine (VITAMIN B1) tablet 100 mg, 100 mg, Oral, Daily, Pahwani, Ravi, MD, 100 mg at 09/14/24 1521   vitamin B-12 (CYANOCOBALAMIN) tablet 500 mcg, 500 mcg, Oral, Daily, Cherlyn Labella, MD, 500 mcg at 09/14/24 0911  Labs and Diagnostic Imaging   CBC:  Recent  Labs  Lab 09/14/24 0244 09/15/24 0141  WBC 11.6* 7.9  NEUTROABS 9.8* 7.3  HGB 13.1 12.9*  HCT 36.8* 37.4*  MCV 85.0 87.8  PLT 198 178    Basic Metabolic Panel:  Lab Results  Component Value Date   NA 129 (L) 09/15/2024   K 3.3 (L) 09/15/2024   CO2 19 (L) 09/15/2024   GLUCOSE 163 (H) 09/15/2024   BUN 14 09/15/2024   CREATININE 0.79 09/15/2024   CALCIUM 7.8 (L) 09/15/2024   GFRNONAA >60 09/15/2024   GFRAA >60 03/27/2018   Lipid Panel:  Lab Results  Component Value Date   LDLCALC 75 09/10/2024   HgbA1c:  Lab Results  Component Value Date   HGBA1C 5.6 09/10/2024    CSF results: Tube #1: 2 WBCs, 12 RBCs, protein 65, glucose 44 Tube #4: 1 WBC, 0 RBC Meningitis encephalitis panel negative Cytology pending Flow cytometry pending Autoimmune panel/paraneoplastic panel pending VDRL pending CSF Gram stain - WBC present with no organisms VZV PCR serum positive VZV PCR CSF negative [facility] spotted fever antibodies negative  CT Head (Code Stroke 11/5): Trace new left superior convexity subarachnoid hemorrhage (1-2 sulci), without mass effect. Otherwise stable non contrast CT appearance of the brain: Nonspecific white matter changes, No convincing cortically based infarct by CT. ASPECTS 10.  MRI brain (11/1): multiple subcortical contrast-enhancing areas greater in the right parieto-occipital fissure but present at multiple subcortical locations in both MCAs. Small areas of diffusion abnormality at the anterior insula on the left, left frontal operculum and right frontal operculum in multiple locations in both corona radiata and some lesions appearing more acute than others based on lower ADC values. Differentials include infectious versus inflammatory encephalitis, VZV or other vasculitis, as well as recent embolic showers, with demyelinating disease being lower on the DDx  Routine EEG: Intermittent generalized slowing  Repeat EEG (11/5): Continuous slow, generalized.  This study is suggestive of generalized cerebral dysfunction (encephalopathy). No seizures or epileptiform discharges were seen throughout the recording.  Repeat MRI brain (11/5):  1. Prominent patchy cortical and subcortical signal abnormality throughout both cerebral hemispheres, overall slightly progressed from 09/09/2024 and indeterminate although encephalitis and vasculitis remain leading considerations. 2. Small volume left parietal subarachnoid hemorrhage.  TTE (11/2): 1. Left ventricular ejection fraction, by estimation, is 55%. Left  ventricular ejection fraction by 3D volume is 52 %. The left ventricle has  normal function. The left ventricle has no regional wall motion  abnormalities. Left ventricular diastolic  parameters are consistent with Grade I diastolic dysfunction (impaired  relaxation). The average left ventricular global longitudinal strain is  -13.3 %.   2. Right ventricular systolic function is normal. The right ventricular  size is normal. Tricuspid regurgitation signal is inadequate for assessing  PA pressure.   3. The mitral valve is normal in structure. Trivial mitral  valve  regurgitation. No evidence of mitral stenosis.   4. The aortic valve is tricuspid. Aortic valve regurgitation is mild. No  aortic stenosis is present.   5. The inferior vena cava is normal in size with greater than 50%  respiratory variability, suggesting right atrial pressure of 3 mmHg.    Assessment  Noah Burnett is an 83 y.o. 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 MRI was performed which revealed enhancing and nonenhancing lesions with a broad differential which included VZV vasculitis/encephalitis, inflammatory, other infectious, and paraneoplastic processes. Also of note-had shingles infection which was treated, so a viral encephalitis is felt to be highest on the DDx. Per the literature,  approximately 1/400 cases of VZV reactivation present with VZV encephalitis.  - Already on acyclovir for viral encephalitis coverage. Will continue for a total of 14 days per literature search on VZV encephalitis/vasculitis. Also has been started on a 5-day course of high-dose IV methylprednisolone x 5 days (started on 11/6), followed by oral prednisone taper. 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  - CSF results as documented above. Cell count is not suspicious for a meningitis, but is compatible with a vasculitis or viral encephalitis.  He also has increased protein in CSF. Overall, MRI contrast-enhancing lesions are most suspicious for VZV encephalitis/vasculitis, and patient had improved since starting acyclovir, followed by the acute decompensation yesterday (Wednesday).  Serum VZV PCR is positive, but CSF VZV PCR is negative. 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 A1c at goal below 6, LDL near goal.  Goal is less than 70, his LDL is 75. - Encephalopathy and strokes may be able to tie down together with 1 diagnosis of vasculitis, especially VZV vasculitis given recent shingles.  VZV PCR serum was negative. CSF VZV PCR ordered on meningitis/encephalitis panel was negative. CSF VZV Ig G has come back positive.  - Repeat MRI shows prominent patchy cortical and subcortical call signal abnormality throughout both cerebral hemispheres  Acute Neuro Change - Stat CT head and CTA Head and Neck completed Wednesday after he decompensated, with trace new left superior convexity subarachnoid hemorrhage, without mass effect. - ASA 81 mg discontinued for now  - Repeat MRI Brain on  11/5 shows a small volume left parietal subarachnoid hemorrhage. - rEEG 11/5: Continuous generalized slowing. No seizures or epileptiform discharges were seen throughout the recording.   # For the encephalopathy - Await full testing results from CSF - Continue antiviral for a total of 14 days. Intense drug monitoring: Continue to closely monitor Cr, and dose adjust acyclovir. Continue with ivf to minimize aki on ckd  - Continue high-dose IV methylprednisolone today x 5 days (start date 11/6), followed by oral prednisone taper.  - No need for antiseizure medications-no evidence of seizure and EEG unremarkable - Patient appears to be improving at this time, so we will continue current course of treatment  ___________________________________________________________  Patient seen by NP and discussed with MD. Earle FORBES Everitt Clint Abbey , MSN, AGACNP-BC [facility] See Amion for schedule and pager information 09/15/2024 8:54 AM   Electronically signed: Dr. Kashay Cavenaugh

## 2024-09-16 ENCOUNTER — Other Ambulatory Visit: Payer: Self-pay

## 2024-09-16 DIAGNOSIS — R627 Adult failure to thrive: Secondary | ICD-10-CM | POA: Diagnosis not present

## 2024-09-16 LAB — BASIC METABOLIC PANEL WITH GFR
Anion gap: 10 (ref 5–15)
BUN: 16 mg/dL (ref 8–23)
CO2: 20 mmol/L — ABNORMAL LOW (ref 22–32)
Calcium: 7.6 mg/dL — ABNORMAL LOW (ref 8.9–10.3)
Chloride: 99 mmol/L (ref 98–111)
Creatinine, Ser: 0.68 mg/dL (ref 0.61–1.24)
GFR, Estimated: >60 mL/min (ref >60)
Glucose, Bld: 209 mg/dL — ABNORMAL HIGH (ref 70–99)
Potassium: 3.8 mmol/L (ref 3.5–5.1)
Sodium: 129 mmol/L — ABNORMAL LOW (ref 135–145)

## 2024-09-16 LAB — GLUCOSE, CAPILLARY
Glucose-Capillary: 161 mg/dL — ABNORMAL HIGH (ref 70–99)
Glucose-Capillary: 189 mg/dL — ABNORMAL HIGH (ref 70–99)
Glucose-Capillary: 198 mg/dL — ABNORMAL HIGH (ref 70–99)
Glucose-Capillary: 218 mg/dL — ABNORMAL HIGH (ref 70–99)
Glucose-Capillary: 223 mg/dL — ABNORMAL HIGH (ref 70–99)

## 2024-09-16 LAB — PHOSPHORUS: Phosphorus: 2.1 mg/dL — ABNORMAL LOW (ref 2.5–4.6)

## 2024-09-16 LAB — MAGNESIUM: Magnesium: 1.7 mg/dL (ref 1.7–2.4)

## 2024-09-16 MED ORDER — BISACODYL 10 MG RE SUPP
10.0000 mg | Freq: Once | RECTAL | Status: AC
  Administered 2024-09-16: 10 mg via RECTAL
  Filled 2024-09-16: qty 1

## 2024-09-16 MED ORDER — MAGNESIUM SULFATE 2 GM/50ML IV SOLN
2.0000 g | Freq: Once | INTRAVENOUS | Status: AC
  Administered 2024-09-16: 2 g via INTRAVENOUS
  Filled 2024-09-16: qty 50

## 2024-09-16 MED ORDER — POTASSIUM PHOSPHATES 15 MMOLE/5ML IV SOLN
30.0000 mmol | Freq: Once | INTRAVENOUS | Status: AC
  Administered 2024-09-16: 30 mmol via INTRAVENOUS
  Filled 2024-09-16: qty 10

## 2024-09-16 NOTE — Plan of Care (Signed)
  Problem: Clinical Measurements: Goal: Will remain free from infection Outcome: Progressing Goal: Respiratory complications will improve Outcome: Progressing   Problem: Nutrition: Goal: Adequate nutrition will be maintained Outcome: Progressing   Problem: Pain Managment: Goal: General experience of comfort will improve and/or be controlled Outcome: Progressing

## 2024-09-16 NOTE — Progress Notes (Signed)
 Physical Therapy Treatment Patient Details Name: Noah Burnett MRN: [id number] DOB: 28-Mar-1941 Today's Date: 09/16/2024   History of Present Illness 83 y.o. male presents to [facility] 09/08/24 after unwitnessed fall and AMS. Admitted with failure to thrive after multiple GI procedures. MRI brain concerning for multiple lesions with differentials that include encephalitis versus infectious etiology versus embolic infarcts. EEG showed encephalopathy. 11/5 code stroke due to L gaze preference and decreased LOA, however, no changes from prior CT head. PMHx: HTN, prediabetes, GERD, gastric outlet obstruction w/ history of bypass gastrojejunostomy    PT Comments  Patient with flat affect and very few verbalizations (mostly cannot understand his speech). Very willing to participate and ambulated +1 min assist with RW x 30 ft, sitting rest, and repeated. Pt actually requested sitting rest break (this appears to be something PT has been working on). Sitting and standing exercises completed. Patient will benefit from continued inpatient follow up therapy, >3 hours/day     If plan is discharge home, recommend the following: A lot of help with bathing/dressing/bathroom;Assistance with cooking/housework;Direct supervision/assist for medications management;Direct supervision/assist for financial management;Assist for transportation;Help with stairs or ramp for entrance;Two people to help with walking and/or transfers   Can travel by private vehicle        Equipment Recommendations  BSC/3in1    Recommendations for Other Services       Precautions / Restrictions Precautions Precautions: Fall Recall of Precautions/Restrictions: Impaired Restrictions Weight Bearing Restrictions Per Provider Order: No     Mobility  Bed Mobility Overal bed mobility: Needs Assistance Bed Mobility: Supine to Sit     Supine to sit: Min assist, Used rails     General bed mobility comments: increased time and min assist     Transfers Overall transfer level: Needs assistance Equipment used: Rolling walker (2 wheels) Transfers: Sit to/from Stand Sit to Stand: Min assist           General transfer comment: min assist to stand and to steady while standing. cues for hand placement; from EOB and recliner    Ambulation/Gait Ambulation/Gait assistance: Min assist Gait Distance (Feet): 30 Feet (sitting rest; 30) Assistive device: Rolling walker (2 wheels) Gait Pattern/deviations: Narrow base of support, Step-through pattern, Decreased stride length, Trunk flexed Gait velocity: decreased     General Gait Details: stayed in his room as no +2 for chair follow. Pt self-selected to sit and rest after 1st lap 30 ft. required min assist for steering Rw to avoid obstacles   Stairs             Wheelchair Mobility     Tilt Bed    Modified Rankin (Stroke Patients Only) Modified Rankin (Stroke Patients Only) Pre-Morbid Rankin Score: Moderately severe disability Modified Rankin: Moderately severe disability     Balance Overall balance assessment: Needs assistance Sitting-balance support: Feet supported Sitting balance-Leahy Scale: Fair Sitting balance - Comments: EOB   Standing balance support: Bilateral upper extremity supported, During functional activity Standing balance-Leahy Scale: Poor Standing balance comment: reliant on external support for standing balance                            Communication Communication Communication: Impaired Factors Affecting Communication: Reduced clarity of speech;Difficulty expressing self;Hearing impaired  Cognition Arousal: Alert Behavior During Therapy: Flat affect   PT - Cognitive impairments: Difficult to assess Difficult to assess due to: Impaired communication  PT - Cognition Comments: Limited verbalizations with pt occasionally nodding yes/no. Mainly incomprehensible speech with pt mumbling. Following  commands: Impaired Following commands impaired: Follows one step commands inconsistently    Cueing Cueing Techniques: Verbal cues, Tactile cues, Visual cues  Exercises General Exercises - Lower Extremity Long Arc Quad: AROM, Both, 10 reps, Seated Hip Flexion/Marching: AROM, Both, 10 reps, Seated, Standing    General Comments        Pertinent Vitals/Pain Pain Assessment Pain Assessment: Faces Breathing: normal    Home Living                          Prior Function            PT Goals (current goals can now be found in the care plan section) Acute Rehab PT Goals Patient Stated Goal: unable to state goal Time For Goal Achievement: 09/24/24 Potential to Achieve Goals: Fair Progress towards PT goals: Progressing toward goals    Frequency    Min 3X/week      PT Plan      Co-evaluation              AM-PAC PT 6 Clicks Mobility   Outcome Measure  Help needed turning from your back to your side while in a flat bed without using bedrails?: A Little Help needed moving from lying on your back to sitting on the side of a flat bed without using bedrails?: A Little Help needed moving to and from a bed to a chair (including a wheelchair)?: A Little Help needed standing up from a chair using your arms (e.g., wheelchair or bedside chair)?: A Little Help needed to walk in hospital room?: A Little Help needed climbing 3-5 steps with a railing? : Total 6 Click Score: 16    End of Session Equipment Utilized During Treatment: Gait belt Activity Tolerance: Patient tolerated treatment well Patient left: in chair;with call bell/phone within reach;with chair alarm set Nurse Communication: Mobility status PT Visit Diagnosis: Unsteadiness on feet (R26.81);Other abnormalities of gait and mobility (R26.89);Muscle weakness (generalized) (M62.81);History of falling (Z91.81)     Time: 8447-8385 PT Time Calculation (min) (ACUTE ONLY): 22 min  Charges:    $Gait  Training: 8-22 mins PT General Charges $$ ACUTE PT VISIT: 1 Visit                      Macario RAMAN, PT Acute Rehabilitation Services  Office [phone]    Macario SHAUNNA Soja 09/16/2024, 4:28 PM

## 2024-09-16 NOTE — Progress Notes (Signed)
 ID PROGRESS NOTE  83yo M with VZV encephalitis, now with vasculitis complicated by trace subarachnoid hemorrhage -- will continue with IV acyclovir, plan for 14 days  Currently on day 7 of 14 IV acyclovir Cr at baseline currently on 455 Q 12hr dosing and current IVF to minimized aki Leukocytosis improved - finishing high dose steroids per neurology  Continue to monitor with daily cr through 11/14, last dose of Iv acyclovir  Continue with piv as access since only needs 7 days more of treatment  Will sign off.  Montie FURY Luiz MD MPH [facility] [phone]

## 2024-09-16 NOTE — Progress Notes (Signed)
 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   Patient had hospital admission at [facility] from 8/18 to 07/09/24 when he presented with nausea and vomiting.  He was found to have gastric outlet obstruction and duodenal mass. He had an EGD that showed an hiatal hernia and compression of the second portion of the duodenum with a possible intraluminal neoplasm. Biopsies were obtained which showed severely inflamed and ulcerated duodenal mucosa, but no evidence of malignancy. A second EGD was done with ultrasound guidance, but again pathology of the lesion extensive necrosis. Surgical oncology was consulted and he underwent a robotic gastrojejunostomy on 8/25. He did have a delayed return of bowel function and had a PICC line with TPN for several days until he had advancement of his diet. Since his hospitalization, he has had a fairly steady functional decline with decreasing appetite, significant weight loss, and generalized weakness.  After discharge, the patient was tolerating a diet and still driving and walking independently.   Unfortunately, the patient developed shingles in the last week of September 2025.  He went to see ophthalmology, and he was cleared from their standpoint.  He finished a course of Valtrex.  He continues to have some postherpetic neuralgia.  Daughter states that since his shingles episode, the patient has had a functional decline with decreasing appetite, significant weight loss, and generalized weakness.   The patient has followed up with surgical oncology on 08/31/2024.  At that time the patient had reported generalized  weakness and fatigue, but he did not have any vomiting or abdominal pain at that time.  He had poor p.o. intake and loss of appetite at that time.  Repeat MRI of the abdomen and pelvis performed on 09/06/2024 which showed no specific MR findings to suggest a duodenal mass.  There was Generalized edema-like signal in the retroperitoneum is presumed reactive in the setting of known duodenal ulceration.  There were multiple benign appearing hepatic lesions presumed to be cysts or hemangioma.   Daughter states that in the past week he has had increasing confusion and generalized weakness with continued decreased oral intake but no other specific symptoms. UA was negative for pyuria.  CT of the brain was negative for acute findings.  Acute abdominal series was negative for obstruction or pneumoperitoneum.   Neurology consulted for evaluation of acute encephalopathy. His hospital course complicated by acute on chronic hyponatremia. Nephrology consulted and he was started on salt tablets.  Further details as below.   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  Differential include encephalitis versus vasculitis versus demyelinating disease vs stroke. Neurology consulted and patient was [facility]. Patient underwent fluoroscopic guided lumbar puncture and fluid sent for analysis for evaluation of meningitis and encephalitis. Procalcitonin is negative, CRP is 4.1. EEG showed diffuse  cerebral dysfunction, no epileptiform  discharges seen throughout the recording. VZVPCR is positive.  Varicella IgG negative.  Other workup negative result.  Early morning of 09/13/2024, patient noted to have left gaze preference and decreased responsiveness , code stroke was called, patient was seen by neurology.  Stat CT head was done which shows left superior convexity subarachnoid hemorrhage without mass effect.  Otherwise no changes from previous CT head.  CT angiogram ruled out large vessel occlusion.  Patient was kept n.p.o., SLP consulted.  Eventually MRI completed which showed Prominent patchy cortical and subcortical signal abnormality throughout both cerebral hemispheres, overall slightly progressed from 09/09/2024 and indeterminate although encephalitis and vasculitis remain leading considerations.  ID recommended continuing acyclovir.  Patient's mental status improved significantly on 09/14/2024.  ID and neuro both suspecting varicella encephalitis as likely etiology of patient's symptoms and recommended 14 days of total acyclovir.  Patient was also started on high-dose methylprednisolone by neurology 09/14/2024 with the plan to continue this for 5 days followed by taper of prednisone.    Chronic hyponatremia: Per nephrology, possibly from SIADH. Sodium in August, 2025 was 129.  Patient was admitted with a sodium of 126 on 10/21.TSH slightly elevated. Am cortisol 30. Serum osmo is 257, urine sodium is 137, urine osmolality is 490.  Nephrology on board.  Started on salt tablets.  Sodium fairly stable.  Patient remains on salt tablets.  Acute urinary retention: On the evening of 09/13/2024, patient was retaining urine, bladder scan showed 900 cc.  Orders were given to the nurses for straight cath however that was unsuccessful so coud catheter was placed.  Started on Flomax.  Nephrology signed off 09/15/2024.  AKI: Creatinine jumped from 0.76 to 1.66 to 2.07 09/14/2024, nephrology was on board, patient given IV fluids, AKI was presumed to  be secondary to obstructive uropathy as well as contrast induced.  AKI resolved 09/15/2024 and nephrology signed off.    GERD/ Duodenal ulceration, stricture and mass s/p  robotic gastrojejunostomy on 8/25. Continue with PPI   Hypertension: On hydrochlorothiazide at home which is on hold, blood pressure controlled.   Hypokalemia: Resolved.  Hypophosphatemia: Will replenish.  Hypomagnesemia: Resolved.  But on the low side, will replenish some.  Recently diagnosed Shingles On Cyclivert.  GOC: Patient remains full code.  On 09/13/2024, I discussed with the son, he wants the patient to be full code however he told me that the siblings are having detailed conversation about the CODE STATUS and this might change.  Palliative is also following.   DVT prophylaxis: enoxaparin (LOVENOX) injection 30 mg Start: 09/09/24 2200   Code Status: Full Code  Family Communication: Daughter present at bedside.  Plan of care discussed with son and he was already aware about the plan by neurology.  Status is: Inpatient Remains inpatient appropriate because: Worsening creatinine, multiple medical issues.   Estimated body mass index is 15.68 kg/m as calculated from the following:   Height as of this encounter: 5' 7 (1.702 m).   Weight as of this encounter: 45.4 kg.    Nutritional Assessment: Body mass index is 15.68 kg/m.SABRA Seen by dietician.  I agree with the assessment and plan as outlined below: Nutrition Status: Nutrition Problem: Severe Malnutrition Etiology: chronic illness Signs/Symptoms: energy intake < or equal to 75% for > or equal to 1 month, severe muscle depletion, severe fat depletion, percent weight loss Percent weight loss: 18 % (in 3 months) Interventions: MVI, Tube feeding, Prostat  . Skin Assessment: I have examined the patient's  skin and I agree with the wound assessment as performed by the wound care RN as outlined below:    Consultants:  Neurology  Procedures:  As  above  Antimicrobials:  Anti-infectives (From admission, onward)    Start     Dose/Rate Route Frequency Ordered Stop   09/15/24 1000  acyclovir (ZOVIRAX) 455 mg in dextrose  5 % 100 mL IVPB        10 mg/kg  45.6 kg 109.1 mL/hr over 60 Minutes Intravenous Every 12 hours 09/15/24 0826     09/13/24 2200  acyclovir (ZOVIRAX) 455 mg in dextrose  5 % 100 mL IVPB  Status:  Discontinued        10 mg/kg  45.6 kg 109.1 mL/hr over 60 Minutes Intravenous Every 24 hours 09/13/24 1923 09/15/24 0826   09/13/24 1800  valACYclovir (VALTREX) tablet 1,000 mg  Status:  Discontinued        1,000 mg Oral Daily after supper 09/13/24 0846 09/13/24 1358   09/09/24 1500  acyclovir (ZOVIRAX) 455 mg in dextrose  5 % 100 mL IVPB  Status:  Discontinued        10 mg/kg  45.6 kg 109.1 mL/hr over 60 Minutes Intravenous Every 12 hours 09/09/24 1331 09/13/24 0846         Subjective: Seen and examined.  No family member at the bedside.  Patient continues to improve.  Fully alert and oriented, following commands.  Objective: Vitals:   09/15/24 2011 09/16/24 0006 09/16/24 0359 09/16/24 0755  BP: 133/63 122/61 130/64 128/63  Pulse: (!) 55 (!) 55 (!) 58   Resp:    18  Temp: 98 F (36.7 C) 98.2 F (36.8 C) 98.5 F (36.9 C) 98.6 F (37 C)  TempSrc: Oral Oral Oral   SpO2: 97% 96% 97% 99%  Weight:      Height:        Intake/Output Summary (Last 24 hours) at 09/16/2024 0811 Last data filed at 09/16/2024 0647 Gross per 24 hour  Intake 3110.34 ml  Output 1500 ml  Net 1610.34 ml   Filed Weights   09/08/24 1138 09/08/24 1626 09/15/24 0500  Weight: 51.2 kg 45.6 kg 45.4 kg    Examination:  General exam: Appears calm and comfortable  Respiratory system: Clear to auscultation. Respiratory effort normal. Cardiovascular system: S1 & S2 heard, RRR. No JVD, murmurs, rubs, gallops or clicks. No pedal edema. Gastrointestinal system: Abdomen is nondistended, soft and nontender. No organomegaly or masses felt. Normal  bowel sounds heard. Central nervous system: Alert and oriented.  Some weakness in right upper and lower extremity, some dysarthria.  Overall improving. Extremities: Symmetric 5 x 5 power. Skin: No rashes, lesions or ulcers.    Data Reviewed: I have personally reviewed following labs and imaging studies  CBC: Recent Labs  Lab 09/11/24 0258 09/12/24 0158 09/13/24 0155 09/14/24 0244 09/15/24 0141  WBC 6.1 11.2* 13.7* 11.6* 7.9  NEUTROABS 4.2 9.6* 12.1* 9.8* 7.3  HGB 13.4 15.5 15.4 13.1 12.9*  HCT 37.5* 43.6 43.7 36.8* 37.4*  MCV 85.4 84.7 85.7 85.0 87.8  PLT 183 234 227 198 178   Basic Metabolic Panel: Recent Labs  Lab 09/10/24 0255 09/11/24 0258 09/11/24 1829 09/12/24 0158 09/13/24 0155 09/14/24 0244 09/15/24 0141 09/16/24 0314  NA 127* 125*   < > 125* 128* 129* 129* 129*  K 4.1 3.6  --  3.0* 4.1 4.0 3.3* 3.8  CL 91* 93*  --  91* 93* 99 98 99  CO2 24 21*  --  20* 23 21* 19* 20*  GLUCOSE 95 85  --  145* 147* 134* 163* 209*  BUN 9 12  --  12 17 24* 14 16  CREATININE 0.73 0.67  --  0.76 1.61* 2.07* 0.79 0.68  CALCIUM 8.3* 7.9*  --  8.0* 8.1* 7.8* 7.8* 7.6*  MG 1.7 1.8  --  1.7 2.5* 2.2  --  1.7  PHOS 3.6  --   --   --   --   --  3.8 2.1*   < > = values in this interval not displayed.   GFR: Estimated Creatinine Clearance: 46.5 mL/min (by C-G formula based on SCr of 0.68 mg/dL). Liver Function Tests: Recent Labs  Lab 09/10/24 0255 09/11/24 0258 09/12/24 0158 09/13/24 0155 09/14/24 0244 09/15/24 0141  AST 14* 12* 21 23 19   --   ALT 11 9 14 12 14   --   ALKPHOS 65 58 66 63 56  --   BILITOT 1.1 1.6* 1.4* 1.0 1.0  --   PROT 5.6* 4.7* 5.6* 5.3* 4.6*  --   ALBUMIN 3.0* 2.6* 3.0* 3.0* 2.4* 2.4*   No results for input(s): LIPASE, AMYLASE in the last 168 hours.  No results for input(s): AMMONIA in the last 168 hours. Coagulation Profile: Recent Labs  Lab 09/10/24 0255  INR 1.0   Cardiac Enzymes: Recent Labs  Lab 09/10/24 0255  CKTOTAL 30*   BNP  (last 3 results) No results for input(s): PROBNP in the last 8760 hours. HbA1C: No results for input(s): HGBA1C in the last 72 hours. CBG: Recent Labs  Lab 09/09/24 1201 09/09/24 1242 09/13/24 1000 09/16/24 0757  GLUCAP 68* 141* 144* 189*   Lipid Profile: No results for input(s): CHOL, HDL, LDLCALC, TRIG, CHOLHDL, LDLDIRECT in the last 72 hours. Thyroid Function Tests: No results for input(s): TSH, T4TOTAL, FREET4, T3FREE, THYROIDAB in the last 72 hours. Anemia Panel: No results for input(s): VITAMINB12, FOLATE, FERRITIN, TIBC, IRON, RETICCTPCT in the last 72 hours. Sepsis Labs: Recent Labs  Lab 09/11/24 0258 09/12/24 0158 09/13/24 0155 09/14/24 0244  PROCALCITON <0.10 <0.10 0.10 <0.10    Recent Results (from the past 240 hours)  CSF culture w Gram Stain     Status: None   Collection Time: 09/10/24  7:10 AM   Specimen: CSF; Cerebrospinal Fluid  Result Value Ref Range Status   Specimen Description CSF  Final   Special Requests NONE  Final   Gram Stain   Final    WBC PRESENT, PREDOMINANTLY MONONUCLEAR NO ORGANISMS SEEN CYTOSPIN SMEAR    Culture   Final    NO GROWTH 3 DAYS Performed at [facility], [address], [place], [place] [address]    Report Status 09/13/2024 FINAL  Final  VZV PCR, CSF     Status: None   Collection Time: 09/11/24  8:39 AM   Specimen: Cerebrospinal Fluid  Result Value Ref Range Status   VZV PCR, CSF Negative Negative Final    Comment: (NOTE) No Varicella Zoster Virus DNA detected. Performed At: [facility] [address] [place], [place] [id number] Jennette Shorter MD Ey:[id number]   Varicella-zoster by PCR     Status: Abnormal   Collection Time: 09/11/24 11:50 AM   Specimen: Blood  Result Value Ref Range Status   Varicella-Zoster, PCR Positive (A) Negative Final    Comment: (NOTE) Varicella Zoster Virus DNA detected. This test was developed and its performance  characteristics determined by [facility]. It has not been cleared or approved by  the Food and Drug Administration. Performed At: [facility] [address] [place], [place] [id number] Jennette Shorter MD Ey:[id number]      Radiology Studies: DG Abd Portable 1V Result Date: 09/15/2024 EXAM: 1 VIEW XRAY OF THE ABDOMEN 09/15/2024 11:40:00 AM COMPARISON: 7 days ago. Feeding tube tip is seen in proximal stomach. CLINICAL HISTORY: Encounter for feeding tube placement. FINDINGS: LINES, TUBES AND DEVICES: Feeding tube tip is seen in proximal stomach. BOWEL: Nonobstructive bowel gas pattern. SOFT TISSUES: No opaque urinary calculi. BONES: No acute osseous abnormality. IMPRESSION: 1. Feeding tube tip in the proximal stomach. Electronically signed by: Lynwood Seip MD 09/15/2024 12:47 PM EST RP Workstation: [id number]    Scheduled Meds:  Chlorhexidine Gluconate Cloth  6 each Topical Daily   enoxaparin (LOVENOX) injection  30 mg Subcutaneous Q24H   feeding supplement (KATE FARMS STANDARD ENT 1.4)  325 mL Oral BID BM   feeding supplement (PROSource TF20)  60 mL Per Tube Daily   lidocaine   1 Application Urethral Once   multivitamin with minerals  1 tablet Oral Daily   pantoprazole   40 mg Oral Daily   polyethylene glycol  17 g Oral Daily   [START ON 09/19/2024] predniSONE  50 mg Oral Q breakfast   rosuvastatin  10 mg Oral Daily   senna  2 tablet Oral Daily   sodium chloride   1 g Oral TID WC   tamsulosin  0.4 mg Oral QPC supper   thiamine  100 mg Oral Daily   vitamin B-12  500 mcg Oral Daily   Continuous Infusions:  sodium chloride  100 mL/hr at 09/16/24 0400   acyclovir (ZOVIRAX) 455 mg in dextrose  5 % 100 mL IVPB Stopped (09/16/24 0011)   feeding supplement (OSMOLITE 1.5 CAL) 30 mL/hr at 09/16/24 0736   methylPREDNISolone (SOLU-MEDROL) injection Stopped (09/15/24 1900)     LOS: 8 days   Fredia Skeeter, MD [facility]  09/16/2024, 8:11 AM   *Please note that this is a verbal  dictation therefore any spelling or grammatical errors are due to the Dragon Medical One system interpretation.  Please page via Amion and do not message via secure chat for urgent patient care matters. Secure chat can be used for non urgent patient care matters.  How to contact the [facility] Attending or Consulting provider 7A - 7P or covering provider during after hours 7P -7A, for this patient?  Check the care team in [facility] and look for a) attending/consulting [facility] provider listed and b) the [facility] team listed. Page or secure chat 7A-7P. Log into [email or web address] and use [facility]'s universal password to access. If you do not have the password, please contact the hospital operator. Locate the [facility] provider you are looking for under [facility] and page to a number that you can be directly reached. If you still have difficulty reaching the provider, please page the [facility] (Director on Call) for the Hospitalists listed on amion for assistance.

## 2024-09-17 ENCOUNTER — Inpatient Hospital Stay (HOSPITAL_COMMUNITY)

## 2024-09-17 DIAGNOSIS — R627 Adult failure to thrive: Secondary | ICD-10-CM | POA: Diagnosis not present

## 2024-09-17 LAB — RENAL FUNCTION PANEL
Albumin: 2.3 g/dL — ABNORMAL LOW (ref 3.5–5.0)
Albumin: 2.3 g/dL — ABNORMAL LOW (ref 3.5–5.0)
Anion gap: 11 (ref 5–15)
Anion gap: 12 (ref 5–15)
BUN: 15 mg/dL (ref 8–23)
BUN: 15 mg/dL (ref 8–23)
CO2: 20 mmol/L — ABNORMAL LOW (ref 22–32)
CO2: 19 mmol/L — ABNORMAL LOW (ref 22–32)
Calcium: 7.3 mg/dL — ABNORMAL LOW (ref 8.9–10.3)
Calcium: 7.3 mg/dL — ABNORMAL LOW (ref 8.9–10.3)
Chloride: 93 mmol/L — ABNORMAL LOW (ref 98–111)
Chloride: 95 mmol/L — ABNORMAL LOW (ref 98–111)
Creatinine, Ser: 0.59 mg/dL — ABNORMAL LOW (ref 0.61–1.24)
Creatinine, Ser: 0.55 mg/dL — ABNORMAL LOW (ref 0.61–1.24)
GFR, Estimated: >60 mL/min (ref >60)
GFR, Estimated: >60 mL/min (ref >60)
Glucose, Bld: 195 mg/dL — ABNORMAL HIGH (ref 70–99)
Glucose, Bld: 155 mg/dL — ABNORMAL HIGH (ref 70–99)
Phosphorus: 1.9 mg/dL — ABNORMAL LOW (ref 2.5–4.6)
Phosphorus: 2.1 mg/dL — ABNORMAL LOW (ref 2.5–4.6)
Potassium: 3.3 mmol/L — ABNORMAL LOW (ref 3.5–5.1)
Potassium: 3.6 mmol/L (ref 3.5–5.1)
Sodium: 124 mmol/L — ABNORMAL LOW (ref 135–145)
Sodium: 126 mmol/L — ABNORMAL LOW (ref 135–145)

## 2024-09-17 LAB — GLUCOSE, CAPILLARY
Glucose-Capillary: 162 mg/dL — ABNORMAL HIGH (ref 70–99)
Glucose-Capillary: 163 mg/dL — ABNORMAL HIGH (ref 70–99)
Glucose-Capillary: 164 mg/dL — ABNORMAL HIGH (ref 70–99)
Glucose-Capillary: 172 mg/dL — ABNORMAL HIGH (ref 70–99)
Glucose-Capillary: 141 mg/dL — ABNORMAL HIGH (ref 70–99)

## 2024-09-17 LAB — MAGNESIUM: Magnesium: 1.7 mg/dL (ref 1.7–2.4)

## 2024-09-17 LAB — PHOSPHORUS: Phosphorus: 1.9 mg/dL — ABNORMAL LOW (ref 2.5–4.6)

## 2024-09-17 MED ORDER — MAGNESIUM SULFATE 2 GM/50ML IV SOLN
2.0000 g | Freq: Once | INTRAVENOUS | Status: AC
  Administered 2024-09-17: 2 g via INTRAVENOUS
  Filled 2024-09-17: qty 50

## 2024-09-17 MED ORDER — POTASSIUM CHLORIDE 20 MEQ PO PACK
40.0000 meq | PACK | Freq: Once | ORAL | Status: AC
  Administered 2024-09-17: 40 meq
  Filled 2024-09-17: qty 2

## 2024-09-17 MED ORDER — SODIUM CHLORIDE 0.9 % IV SOLN: INTRAVENOUS | Status: DC

## 2024-09-17 MED ORDER — POTASSIUM PHOSPHATES 15 MMOLE/5ML IV SOLN
30.0000 mmol | Freq: Once | INTRAVENOUS | Status: AC
  Administered 2024-09-17: 30 mmol via INTRAVENOUS
  Filled 2024-09-17: qty 10

## 2024-09-17 MED ORDER — SODIUM CHLORIDE 1 G PO TABS
2.0000 g | ORAL_TABLET | Freq: Three times a day (TID) | ORAL | Status: DC
  Administered 2024-09-18 – 2024-09-21 (×11): 2 g via ORAL
  Filled 2024-09-17 (×12): qty 2

## 2024-09-17 NOTE — Progress Notes (Signed)
 [facility] Progress Note  Subjective:  Asked to see again for hyponatremia NS 0.9% was started for renal protection on 11/04 at 100 cc/hr, due to necessity of IV acyclovir for his suspected herpes zoster encephalitis Na+ levels had improved  to 128 on 11/05, then 129 on 11/06- 11/08, then today dropped to 124 NaCl tabs he was getting for SIADH were 1 gm tid from 11/03- current Pharmacy said he needs IV acyclovir for another 6 days, thus also they need IVF's to protect the kidney which has been 0.9% at 100 cc/hr. But they said that they can go down on the dosing to 50 cc/hr.   Summary presentation: Noah Burnett is an 83 y.o. 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 He had a robotic gastrojejunostomy on 8/25 requiring TPN via PICC subsequently. Patient reportedly has had decreased appetite, weight loss and inability to walk. Reportedly repeat MRI which did not show the duodenal mass. Patient brought in for failure to thrive as well as a unwitnessed fall. CT head was negative for large vessel occlusion, no e/o vasculitis, no other acute intracranial abnormality. Denies any NSAID use but he has not been eating much at all for at least a month.  Vitals:   09/16/24 0755 09/16/24 1105 09/16/24 1509 09/17/24 1100  BP: 128/63 (!) 126/58 (!) 133/52 135/68  Pulse:  (!) 54 63 (!) 56  Resp: 18 18 18 18   Temp: 98.6 F (37 C) 98.3 F (36.8 C) 98.3 F (36.8 C) 98.2 F (36.8 C)  TempSrc:    Oral  SpO2: 99% 98% 96% 96%  Weight:      Height:        Exam: Gen: NAD CVS: bradycardic Resp: CTA Abd: +BS, soft, NT/ND Ext: no edema   VZV PCD on CSF: negative VAV PCR on blood: positive CSF culture w/ GS: wbc's, no org on GS; no growth to  date  Am cortisol = 30 (6-23) on 09/11/24 TSH 6.1 on 09/10/24 UA 10/31= negative UNa= 64, 137 UCr= 120 UOsm= 490 BP's: stable since admission, no hypotension I/o= 12.4 in and 8.7 out, = + 3.7 L  Wts are stable in 45 range  Assessment/ Plan: Hyponatremia - was stable around 129, likely SIADH based upon clinical status and urine studies. Salt tabs were started on 11/03 and Na+ levels were 129 stable until today w/ Na+ 124. Likely cause of this drop is the 0.9% NS started on 11/04 at 100 cc/hr (giving isotonic fluids to an SIADH causes drop in serum Na+0. Have d/w pmd and pharmacy. Pharmacy said they can lower the NS 0.9% saline to 50 cc/hr, which will help. And we will ^ the salt tabs to 2gm tid and titrate as needed to keep Na+ in the 127- 130 range. Will follow.  New onset AKI -likely was obstructive and possibly some contrast exposure. Foley catheter placed with good urine output and normalization of GFR. Resolved.  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   No results for input(s): IRON, TIBC, FERRITIN in the last 168 hours. Inpatient medications:  Chlorhexidine Gluconate Cloth  6 each Topical Daily   enoxaparin (LOVENOX) injection  30 mg Subcutaneous Q24H   feeding supplement (KATE FARMS STANDARD ENT 1.4)  325 mL Oral BID BM   feeding supplement (PROSource TF20)  60 mL Per Tube Daily   lidocaine   1 Application Urethral Once    multivitamin with minerals  1 tablet Oral Daily   pantoprazole   40 mg Oral Daily   polyethylene glycol  17 g Oral Daily   [START ON 09/19/2024] predniSONE  50 mg Oral Q breakfast   rosuvastatin  10 mg Oral Daily   senna  2 tablet Oral Daily   sodium chloride   2 g Oral TID WC   tamsulosin  0.4 mg Oral QPC supper   thiamine  100 mg Oral Daily   vitamin B-12  500 mcg Oral Daily    sodium chloride      acyclovir (ZOVIRAX) 455 mg in dextrose  5 % 100 mL IVPB 455 mg (09/17/24 1030)   feeding supplement (OSMOLITE 1.5 CAL) Stopped (09/17/24 0730)   methylPREDNISolone (SOLU-MEDROL) injection Stopped (09/16/24 1809)   potassium PHOSPHATE IVPB (in mmol) 30 mmol (09/17/24 1133)   acetaminophen , loperamide, ondansetron  **OR** ondansetron  (ZOFRAN ) IV

## 2024-09-17 NOTE — Progress Notes (Signed)
   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 Palliative care has been asked to support goals of care conversations.   Today's Discussion 09/17/2024  *Please note that this is a verbal dictation therefore any spelling or grammatical errors are due to the Dragon Medical One system interpretation.  Chart reviewed inclusive of vital signs, progress notes of Dr. Vernon, Dr. Luiz - ID, Macario Soja - PT, Chyrl Conger - RN, laboratory results Na 126, K 3.6 CHL 95, CO2 19, Glu 155, BUN 15, Cr 0.55, Ca 7.3, Anion Gap 12, GFR > 60, and diagnostic images - Abd Xray w/ coretrack in stomach.  I met with Noah Burnett this morning. He is aware of self and that he is in [place] though he is unsure of where in [place] we are. He points to his sheets which appear soiled. I shared that I will inform staff to better help get him cleaned up. He is otherwise not distressed upon speaking with me.   I met with Noah Burnett daughter, Noah Burnett this afternoon. She shares that Noah Burnett has been approved for CIR though there are some things that need to be optimized prior to him transitioning. Noah Burnett feels that Noah Burnett is generally doing better than he had been when we met last week. She shares hope(s) for ongoing improvement.  Questions and concerns addressed/Palliative Support Provided.   Objective Assessment: Vital Signs Vitals:   09/16/24 1105 09/16/24 1509  BP: (!) 126/58 (!) 133/52  Pulse: (!) 54 63  Resp: 18 18  Temp: 98.3 F (36.8 C) 98.3 F (36.8 C)  SpO2: 98% 96%    Intake/Output Summary (Last 24 hours) at 09/17/2024 9283 Last data filed at 09/17/2024 0400 Gross per 24 hour  Intake 2582.75 ml  Output 850 ml  Net 1732.75 ml    Last Weight  Most recent update: 09/15/2024  5:25 AM    Weight  45.4 kg (100 lb 1.4 oz)            Gen: Elderly Caucasian male chronically ill in appearance HEENT: Coretrack in place, Dry mucous membranes CV: Regular rate and rhythm PULM: On room air breathing is even and nonlabored ABD: soft/nontender EXT: No edema Neuro: Aware of self and that we are in [place]  SUMMARY OF RECOMMENDATIONS   Full code/full scope of care   Allowing time for outcomes   Emotional support provided  Plan for CIR once patient is optimized   Ongoing palliative care support as needed ______________________________________________________________________________________ Rosaline Becton [place] Palliative Medicine Team Team Cell Phone: [phone] Please utilize secure chat with additional questions, if there is no response within 30 minutes please call the above phone number  Billing based on MDM: Moderate   Palliative Medicine Team providers are available by phone from 7am to 7pm daily and can be reached through the team cell phone.  Should this patient require assistance outside of these hours, please call the patient's attending physician.

## 2024-09-17 NOTE — Progress Notes (Signed)
 PROGRESS NOTE    Noah Burnett  FMW:[id number] DOB: 09-May-1941 DOA: 09/08/2024 PCP: [name], MD   Brief Narrative:  83 year old male with a history of prostate cancer (on observation), tobacco abuse, hypertension, GERD, duodenal mass resulting in gastric L obstruction status post robotic gastrojejunostomy 07/03/2024 presenting with decreased oral intake, generalized weakness, and confusion.   Patient had hospital admission at [facility] from 8/18 to 07/09/24 when he presented with nausea and vomiting.  He was found to have gastric outlet obstruction and duodenal mass. He had an EGD that showed an hiatal hernia and compression of the second portion of the duodenum with a possible intraluminal neoplasm. Biopsies were obtained which showed severely inflamed and ulcerated duodenal mucosa, but no evidence of malignancy. A second EGD was done with ultrasound guidance, but again pathology of the lesion extensive necrosis. Surgical oncology was consulted and he underwent a robotic gastrojejunostomy on 8/25. He did have a delayed return of bowel function and had a PICC line with TPN for several days until he had advancement of his diet. Since his hospitalization, he has had a fairly steady functional decline with decreasing appetite, significant weight loss, and generalized weakness.  After discharge, the patient was tolerating a diet and still driving and walking independently.   Unfortunately, the patient developed shingles in the last week of September 2025.  He went to see ophthalmology, and he was cleared from their standpoint.  He finished a course of Valtrex.  He continues to have some postherpetic neuralgia.  Daughter states that since his shingles episode, the patient has had a functional decline with decreasing appetite, significant weight loss, and generalized weakness.   The patient has followed up with surgical oncology on 08/31/2024.  At that time the patient had reported generalized  weakness and fatigue, but he did not have any vomiting or abdominal pain at that time.  He had poor p.o. intake and loss of appetite at that time.  Repeat MRI of the abdomen and pelvis performed on 09/06/2024 which showed no specific MR findings to suggest a duodenal mass.  There was Generalized edema-like signal in the retroperitoneum is presumed reactive in the setting of known duodenal ulceration.  There were multiple benign appearing hepatic lesions presumed to be cysts or hemangioma.   Daughter states that in the past week he has had increasing confusion and generalized weakness with continued decreased oral intake but no other specific symptoms. UA was negative for pyuria.  CT of the brain was negative for acute findings.  Acute abdominal series was negative for obstruction or pneumoperitoneum.   Neurology consulted for evaluation of acute encephalopathy. His hospital course complicated by acute on chronic hyponatremia. Nephrology consulted and he was started on salt tablets.  Further details as below.   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  Differential include encephalitis versus vasculitis versus demyelinating disease vs stroke. Neurology consulted and patient was [facility]. Patient underwent fluoroscopic guided lumbar puncture and fluid sent for analysis for evaluation of meningitis and encephalitis. Procalcitonin is negative, CRP is 4.1. EEG showed diffuse  cerebral dysfunction, no epileptiform  discharges seen throughout the recording. VZVPCR is positive.  Varicella IgG negative.  Other workup negative result.  Early morning of 09/13/2024, patient noted to have left gaze preference and decreased responsiveness , code stroke was called, patient was seen by neurology.  Stat CT head was done which shows left superior convexity subarachnoid hemorrhage without mass effect.  Otherwise no changes from previous CT head.  CT angiogram ruled out large vessel occlusion.  Patient was kept n.p.o., SLP consulted.  Eventually MRI completed which showed Prominent patchy cortical and subcortical signal abnormality throughout both cerebral hemispheres, overall slightly progressed from 09/09/2024 and indeterminate although encephalitis and vasculitis remain leading considerations.  ID recommended continuing acyclovir.  Patient's mental status improved significantly on 09/14/2024.  ID and neuro both suspecting varicella encephalitis as likely etiology of patient's symptoms and recommended 14 days of total acyclovir.  Patient was also started on high-dose methylprednisolone by neurology 09/14/2024 with the plan to continue this for 5 days followed by taper of prednisone.    Chronic hyponatremia: Per nephrology, possibly from SIADH. Sodium in August, 2025 was 129.  Patient was admitted with a sodium of 126 on 10/21.TSH slightly elevated. Am cortisol 30. Serum osmo is 257, urine sodium is 137, urine osmolality is 490.  And on salt tablets 1 g 3 times daily.  Nephrology signed off.  Patient's sodium dropped to 124.  Discussed with Dr. Geralynn of nephrology, he recommended stopping fluids but patient needs some gentle hydration to prevent AKI while on acyclovir, per pharmacy.  Nephrology has okayed to continue at 50 cc/h.  Acute urinary retention: On the evening of 09/13/2024, patient was retaining urine, bladder scan showed 900 cc.  Orders were given to the nurses for straight cath however that was unsuccessful so coud  catheter was placed.  Started on Flomax.  Nephrology signed off 09/15/2024.  AKI: Creatinine jumped from 0.76 to 1.66 to 2.07 09/14/2024, nephrology was on board, patient given IV fluids, AKI was presumed to be secondary to obstructive uropathy as well as contrast induced.  AKI resolved 09/15/2024 and nephrology signed off.    GERD/ Duodenal ulceration, stricture and mass s/p  robotic gastrojejunostomy on 8/25. Continue with PPI  Nausea vomiting/abdominal pain: Patient was complaining of nausea and vomiting earlier morning along with abdominal pain however he also had 2 bowel movements.  On my examination, he was appearing calm and comfortable, per daughter, he was in pain earlier.  However not in pain while I was there.  Abdominal x-ray is completed but results are pending.  Suspect his symptoms are likely secondary to his known severe GERD on top of tube feedings.  Tube feeds were held briefly by nurse this morning.  Omeprazole which was switched from Protonix  per patient's request.   Hypertension: On hydrochlorothiazide at home which is on hold, blood pressure controlled.   Hypokalemia: Resolved.  Hypophosphatemia: Will replenish.  Hypomagnesemia: Resolved.  But on the low side, will replenish some.  Recently diagnosed Shingles On Cyclivert.  GOC: Patient remains full code.  On 09/13/2024, I discussed with the son, he wants the patient to be full code however he told me that the siblings are having detailed conversation about the CODE STATUS and this might change.  Palliative is also following.   DVT prophylaxis: enoxaparin (LOVENOX) injection 30 mg Start: 09/09/24 2200   Code Status: Full Code  Family Communication: Daughter present at bedside.  Plan of care discussed with son and he was already aware about  the plan by neurology.  Status is: Inpatient Remains inpatient appropriate because: Patient medically stable, pending placement.   Estimated body mass index is 15.68 kg/m as  calculated from the following:   Height as of this encounter: 5' 7 (1.702 m).   Weight as of this encounter: 45.4 kg.    Nutritional Assessment: Body mass index is 15.68 kg/m.SABRA Seen by dietician.  I agree with the assessment and plan as outlined below: Nutrition Status: Nutrition Problem: Severe Malnutrition Etiology: chronic illness Signs/Symptoms: energy intake < or equal to 75% for > or equal to 1 month, severe muscle depletion, severe fat depletion, percent weight loss Percent weight loss: 18 % (in 3 months) Interventions: MVI, Tube feeding, Prostat  . Skin Assessment: I have examined the patient's skin and I agree with the wound assessment as performed by the wound care RN as outlined below:    Consultants:  Neurology  Procedures:  As above  Antimicrobials:  Anti-infectives (From admission, onward)    Start     Dose/Rate Route Frequency Ordered Stop   09/15/24 1000  acyclovir (ZOVIRAX) 455 mg in dextrose  5 % 100 mL IVPB        10 mg/kg  45.6 kg 109.1 mL/hr over 60 Minutes Intravenous Every 12 hours 09/15/24 0826     09/13/24 2200  acyclovir (ZOVIRAX) 455 mg in dextrose  5 % 100 mL IVPB  Status:  Discontinued        10 mg/kg  45.6 kg 109.1 mL/hr over 60 Minutes Intravenous Every 24 hours 09/13/24 1923 09/15/24 0826   09/13/24 1800  valACYclovir (VALTREX) tablet 1,000 mg  Status:  Discontinued        1,000 mg Oral Daily after supper 09/13/24 0846 09/13/24 1358   09/09/24 1500  acyclovir (ZOVIRAX) 455 mg in dextrose  5 % 100 mL IVPB  Status:  Discontinued        10 mg/kg  45.6 kg 109.1 mL/hr over 60 Minutes Intravenous Every 12 hours 09/09/24 1331 09/13/24 0846         Subjective: Patient seen and examined this morning, daughter at the bedside.  Before seeing the patient, I received a message from [facility] coordinator that patient was offered a bed however later on I was informed that the offer has been retracted due to patient's symptoms of nausea and vomiting.  They  will accept the patient tomorrow.  I had informed them that patient is medically stable today for discharge.  Objective: Vitals:   09/16/24 0755 09/16/24 1105 09/16/24 1509 09/17/24 1100  BP: 128/63 (!) 126/58 (!) 133/52 135/68  Pulse:  (!) 54 63 (!) 56  Resp: 18 18 18 18   Temp: 98.6 F (37 C) 98.3 F (36.8 C) 98.3 F (36.8 C) 98.2 F (36.8 C)  TempSrc:    Oral  SpO2: 99% 98% 96% 96%  Weight:      Height:        Intake/Output Summary (Last 24 hours) at 09/17/2024 1347 Last data filed at 09/17/2024 0730 Gross per 24 hour  Intake 2582.75 ml  Output 2900 ml  Net -317.25 ml   Filed Weights   09/08/24 1138 09/08/24 1626 09/15/24 0500  Weight: 51.2 kg 45.6 kg 45.4 kg    Examination:  General exam: Appears calm and comfortable  Respiratory system: Clear to auscultation. Respiratory effort normal. Cardiovascular system: S1 & S2 heard, RRR. No JVD, murmurs, rubs, gallops or clicks. No pedal edema. Gastrointestinal system: Abdomen is nondistended, soft and nontender. No organomegaly or masses  felt. Normal bowel sounds heard. Central nervous system: Alert and oriented.  Some weakness in right upper and lower extremity, some dysarthria.  Overall improving. Extremities: Symmetric 5 x 5 power. Skin: No rashes, lesions or ulcers.    Data Reviewed: I have personally reviewed following labs and imaging studies  CBC: Recent Labs  Lab 09/11/24 0258 09/12/24 0158 09/13/24 0155 09/14/24 0244 09/15/24 0141  WBC 6.1 11.2* 13.7* 11.6* 7.9  NEUTROABS 4.2 9.6* 12.1* 9.8* 7.3  HGB 13.4 15.5 15.4 13.1 12.9*  HCT 37.5* 43.6 43.7 36.8* 37.4*  MCV 85.4 84.7 85.7 85.0 87.8  PLT 183 234 227 198 178   Basic Metabolic Panel: Recent Labs  Lab 09/12/24 0158 09/13/24 0155 09/14/24 0244 09/15/24 0141 09/16/24 0314 09/17/24 0130 09/17/24 1046  NA 125* 128* 129* 129* 129* 124* 126*  K 3.0* 4.1 4.0 3.3* 3.8 3.3* 3.6  CL 91* 93* 99 98 99 93* 95*  CO2 20* 23 21* 19* 20* 20* 19*  GLUCOSE  145* 147* 134* 163* 209* 195* 155*  BUN 12 17 24* 14 16 15 15   CREATININE 0.76 1.61* 2.07* 0.79 0.68 0.59* 0.55*  CALCIUM 8.0* 8.1* 7.8* 7.8* 7.6* 7.3* 7.3*  MG 1.7 2.5* 2.2  --  1.7 1.7  --   PHOS  --   --   --  3.8 2.1* 1.9*  1.9* 2.1*   GFR: Estimated Creatinine Clearance: 46.5 mL/min (A) (by C-G formula based on SCr of 0.55 mg/dL (L)). Liver Function Tests: Recent Labs  Lab 09/11/24 0258 09/12/24 0158 09/13/24 0155 09/14/24 0244 09/15/24 0141 09/17/24 0130 09/17/24 1046  AST 12* 21 23 19   --   --   --   ALT 9 14 12 14   --   --   --   ALKPHOS 58 66 63 56  --   --   --   BILITOT 1.6* 1.4* 1.0 1.0  --   --   --   PROT 4.7* 5.6* 5.3* 4.6*  --   --   --   ALBUMIN 2.6* 3.0* 3.0* 2.4* 2.4* 2.3* 2.3*   No results for input(s): LIPASE, AMYLASE in the last 168 hours.  No results for input(s): AMMONIA in the last 168 hours. Coagulation Profile: No results for input(s): INR, PROTIME in the last 168 hours.  Cardiac Enzymes: No results for input(s): CKTOTAL, CKMB, CKMBINDEX, TROPONINI in the last 168 hours.  BNP (last 3 results) No results for input(s): PROBNP in the last 8760 hours. HbA1C: No results for input(s): HGBA1C in the last 72 hours. CBG: Recent Labs  Lab 09/16/24 2040 09/16/24 2352 09/17/24 0416 09/17/24 0740 09/17/24 1158  GLUCAP 161* 198* 162* 163* 164*   Lipid Profile: No results for input(s): CHOL, HDL, LDLCALC, TRIG, CHOLHDL, LDLDIRECT in the last 72 hours. Thyroid Function Tests: No results for input(s): TSH, T4TOTAL, FREET4, T3FREE, THYROIDAB in the last 72 hours. Anemia Panel: No results for input(s): VITAMINB12, FOLATE, FERRITIN, TIBC, IRON, RETICCTPCT in the last 72 hours. Sepsis Labs: Recent Labs  Lab 09/11/24 0258 09/12/24 0158 09/13/24 0155 09/14/24 0244  PROCALCITON <0.10 <0.10 0.10 <0.10    Recent Results (from the past 240 hours)  CSF culture w Gram Stain     Status: None    Collection Time: 09/10/24  7:10 AM   Specimen: CSF; Cerebrospinal Fluid  Result Value Ref Range Status   Specimen Description CSF  Final   Special Requests NONE  Final   Gram Stain   Final  WBC PRESENT, PREDOMINANTLY MONONUCLEAR NO ORGANISMS SEEN CYTOSPIN SMEAR    Culture   Final    NO GROWTH 3 DAYS Performed at [name], [address] [place], [place] [address]    Report Status 09/13/2024 FINAL  Final  VZV PCR, CSF     Status: None   Collection Time: 09/11/24  8:39 AM   Specimen: Cerebrospinal Fluid  Result Value Ref Range Status   VZV PCR, CSF Negative Negative Final    Comment: (NOTE) No Varicella Zoster Virus DNA detected. Performed At: [facility] [address] [place], [place] [id number] Jennette Shorter MD Ey:[id number]   Varicella-zoster by PCR     Status: Abnormal   Collection Time: 09/11/24 11:50 AM   Specimen: Blood  Result Value Ref Range Status   Varicella-Zoster, PCR Positive (A) Negative Final    Comment: (NOTE) Varicella Zoster Virus DNA detected. This test was developed and its performance characteristics determined by [facility]. It has not been cleared or approved by the Food and Drug Administration. Performed At: [facility] [address] [place], [place] [id number] Jennette Shorter MD Ey:[id number]      Radiology Studies: US  EKG SITE RITE Result Date: 09/16/2024 If Site Rite image not attached, placement could not be confirmed due to current cardiac rhythm.   Scheduled Meds:  Chlorhexidine Gluconate Cloth  6 each Topical Daily   enoxaparin (LOVENOX) injection  30 mg Subcutaneous Q24H   feeding supplement (KATE FARMS STANDARD ENT 1.4)  325 mL Oral BID BM   feeding supplement (PROSource TF20)  60 mL Per Tube Daily   lidocaine   1 Application Urethral Once   multivitamin with minerals  1 tablet Oral Daily   pantoprazole   40 mg Oral Daily   polyethylene glycol  17 g Oral Daily   [START ON 09/19/2024] predniSONE  50 mg  Oral Q breakfast   rosuvastatin  10 mg Oral Daily   senna  2 tablet Oral Daily   sodium chloride   2 g Oral TID WC   tamsulosin  0.4 mg Oral QPC supper   thiamine  100 mg Oral Daily   vitamin B-12  500 mcg Oral Daily   Continuous Infusions:  sodium chloride      acyclovir (ZOVIRAX) 455 mg in dextrose  5 % 100 mL IVPB 455 mg (09/17/24 1030)   feeding supplement (OSMOLITE 1.5 CAL) Stopped (09/17/24 0730)   methylPREDNISolone (SOLU-MEDROL) injection Stopped (09/16/24 1809)   potassium PHOSPHATE IVPB (in mmol) 30 mmol (09/17/24 1133)     LOS: 9 days   Fredia Skeeter, MD [facility]  09/17/2024, 1:47 PM   *Please note that this is a verbal dictation therefore any spelling or grammatical errors are due to the Dragon Medical One system interpretation.  Please page via Amion and do not message via secure chat for urgent patient care matters. Secure chat can be used for non urgent patient care matters.  How to contact the [facility] Attending or Consulting provider 7A - 7P or covering provider during after hours 7P -7A, for this patient?  Check the care team in [facility] and look for a) attending/consulting [facility] provider listed and b) the [facility] team listed. Page or secure chat 7A-7P. Log into [email or web address] and use [facility] universal password to access. If you do not have the password, please contact the hospital operator. Locate the [facility] provider you are looking for under [facility] and page to a number that you can be directly reached. If you still have difficulty reaching  the provider, please page the [facility] (Director on Call) for the Hospitalists listed on amion for assistance.

## 2024-09-17 NOTE — Plan of Care (Signed)
   Problem: Clinical Measurements: Goal: Will remain free from infection Outcome: Progressing   Problem: Clinical Measurements: Goal: Respiratory complications will improve Outcome: Progressing   Problem: Clinical Measurements: Goal: Cardiovascular complication will be avoided Outcome: Progressing

## 2024-09-17 NOTE — Plan of Care (Signed)
  Problem: Clinical Measurements: Goal: Will remain free from infection Outcome: Progressing Goal: Cardiovascular complication will be avoided Outcome: Progressing   Problem: Nutrition: Goal: Adequate nutrition will be maintained Outcome: Progressing   Problem: Pain Managment: Goal: General experience of comfort will improve and/or be controlled Outcome: Progressing

## 2024-09-17 NOTE — Progress Notes (Signed)
 Pt's KUB resulted in an ileus. Dr. Vernon aware. Will hold tube feedings and meds for bowel rest until further notice.

## 2024-09-17 NOTE — Progress Notes (Signed)
 NEUROLOGY CONSULT FOLLOW UP NOTE   Date of service: September 17, 2024 Patient Name: Noah Burnett MRN:  [id number] DOB:  1941/05/26  Interval Hx/subjective  Patient is seen in his room with 1 family member at the bedside.  He is more alert and using his right arm more.  He has been having issues with GERD and nausea despite omeprazole  Vitals   Vitals:   09/16/24 0359 09/16/24 0755 09/16/24 1105 09/16/24 1509  BP: 130/64 128/63 (!) 126/58 (!) 133/52  Pulse: (!) 58  (!) 54 63  Resp:  18 18 18   Temp: 98.5 F (36.9 C) 98.6 F (37 C) 98.3 F (36.8 C) 98.3 F (36.8 C)  TempSrc: Oral     SpO2: 97% 99% 98% 96%  Weight:      Height:         Body mass index is 15.68 kg/m.  Physical Exam   General: Well-nourished, well-developed elderly patient  HEENT: Normocephalic, atraumatic Lungs: Respirations regular and unlabored on room air CV: Regular rate and rhythm on the monitor   NEURO:  Mental Status:  Speech is dysarthric but improved. Follows simple and two-step commands consistently.  Able to state his full name, states he is in the hospital and states the month is October (when he came into the hospital) Cranial Nerves:  II: PERRL.  III, IV, VI: EOMI, eyelids elevate symmetrically. V: Sensation is intact to light touch and symmetrical VII: face is symmetrical resting and smiling VIII: Hearing intact to voice. IX, X: Mildly dysarthric Motor: Able to move all 4 extremities with symmetrical antigravity strength Tone is normal and bulk is normal Sensation- Intact to light touch bilaterally.  Coordination: FTN intact bilaterally Gait- Deferred  Medications  Current Facility-Administered Medications:    0.9 %  sodium chloride  infusion, , Intravenous, Continuous, Gretel Prentice BIRCH, [facility], Last Rate: 100 mL/hr at 09/16/24 1028, New Bag at 09/16/24 1028   acetaminophen  (TYLENOL ) tablet 650 mg, 650 mg, Oral, Q6H PRN, Tat, Alm, MD, 650 mg at 09/17/24 0208   acyclovir (ZOVIRAX)  455 mg in dextrose  5 % 100 mL IVPB, 10 mg/kg, Intravenous, Q12H, Bevely Damien RAMAN, RPH, Paused at 09/16/24 2225   Chlorhexidine Gluconate Cloth 2 % PADS 6 each, 6 each, Topical, Daily, Rathore, Vasundhra, MD, 6 each at 09/16/24 1109   enoxaparin (LOVENOX) injection 30 mg, 30 mg, Subcutaneous, Q24H, Tat, Alm, MD, 30 mg at 09/16/24 2226   feeding supplement (KATE FARMS STANDARD ENT 1.4) liquid 325 mL, 325 mL, Oral, BID BM, Pahwani, Ravi, MD, 325 mL at 09/16/24 1110   feeding supplement (OSMOLITE 1.5 CAL) liquid 1,000 mL, 1,000 mL, Per Tube, Continuous, Pahwani, Fredia, MD, Stopped at 09/17/24 0730   feeding supplement (PROSource TF20) liquid 60 mL, 60 mL, Per Tube, Daily, Pahwani, Ravi, MD, 60 mL at 09/16/24 1029   lidocaine  (XYLOCAINE ) 2 % jelly 1 Application, 1 Application, Urethral, Once, Vernon Fredia, MD   loperamide (IMODIUM) capsule 2 mg, 2 mg, Oral, PRN, Howerter, Justin B, DO, 2 mg at 09/11/24 2328   magnesium sulfate IVPB 2 g 50 mL, 2 g, Intravenous, Once, Pahwani, Ravi, MD   methylPREDNISolone sodium succinate (SOLU-MEDROL) 1,000 mg in sodium chloride  0.9 % 50 mL IVPB, 1,000 mg, Intravenous, Q24H, Acelin Ferdig, MD, Stopped at 09/16/24 1809   multivitamin with minerals tablet 1 tablet, 1 tablet, Oral, Daily, Tat, David, MD, 1 tablet at 09/16/24 1028   ondansetron  (ZOFRAN ) tablet 4 mg, 4 mg, Oral, Q6H PRN **OR** ondansetron  (ZOFRAN ) injection  4 mg, 4 mg, Intravenous, Q6H PRN, Tat, David, MD, 4 mg at 09/17/24 0901   pantoprazole  (PROTONIX ) EC tablet 40 mg, 40 mg, Oral, Daily, Pahwani, Ravi, MD, 40 mg at 09/15/24 1758   polyethylene glycol (MIRALAX / GLYCOLAX) packet 17 g, 17 g, Oral, Daily, Tat, David, MD, 17 g at 09/16/24 1029   potassium chloride (KLOR-CON) packet 40 mEq, 40 mEq, Per Tube, Once, Pahwani, Fredia, MD   potassium PHOSPHATE 30 mmol in dextrose  5 % 500 mL infusion, 30 mmol, Intravenous, Once, Vernon Fredia, MD   NOREEN ON 09/19/2024] predniSONE (DELTASONE) tablet 50 mg, 50 mg,  Oral, Q breakfast, Carey Lafon, MD   rosuvastatin (CRESTOR) tablet 10 mg, 10 mg, Oral, Daily, Arora, Ashish, MD, 10 mg at 09/16/24 1028   senna (SENOKOT) tablet 17.2 mg, 2 tablet, Oral, Daily, Tat, David, MD, 17.2 mg at 09/16/24 1029   sodium chloride  tablet 1 g, 1 g, Oral, TID WC, Melia Lynwood ORN, MD, 1 g at 09/16/24 1707   tamsulosin (FLOMAX) capsule 0.4 mg, 0.4 mg, Oral, QPC supper, Vernon Fredia, MD, 0.4 mg at 09/16/24 1707   thiamine (VITAMIN B1) tablet 100 mg, 100 mg, Oral, Daily, Pahwani, Ravi, MD, 100 mg at 09/16/24 1028   vitamin B-12 (CYANOCOBALAMIN) tablet 500 mcg, 500 mcg, Oral, Daily, Cherlyn Labella, MD, 500 mcg at 09/16/24 1028  Labs and Diagnostic Imaging   CBC:  Recent Labs  Lab 09/14/24 0244 09/15/24 0141  WBC 11.6* 7.9  NEUTROABS 9.8* 7.3  HGB 13.1 12.9*  HCT 36.8* 37.4*  MCV 85.0 87.8  PLT 198 178    Basic Metabolic Panel:  Lab Results  Component Value Date   NA 124 (L) 09/17/2024   K 3.3 (L) 09/17/2024   CO2 20 (L) 09/17/2024   GLUCOSE 195 (H) 09/17/2024   BUN 15 09/17/2024   CREATININE 0.59 (L) 09/17/2024   CALCIUM 7.3 (L) 09/17/2024   GFRNONAA >60 09/17/2024   GFRAA >60 03/27/2018   Lipid Panel:  Lab Results  Component Value Date   LDLCALC 75 09/10/2024   HgbA1c:  Lab Results  Component Value Date   HGBA1C 5.6 09/10/2024    CSF results: Tube #1: 2 WBCs, 12 RBCs, protein 65, glucose 44 Tube #4: 1 WBC, 0 RBC Meningitis encephalitis panel negative Cytology no malignant cells, chronic inflammatory cells, including lymphocytes, eosinophils and monocytes Flow cytometry insufficient sample Autoimmune panel/paraneoplastic panel negative VDRL pending CSF Gram stain - WBC present with no organisms VZV PCR serum positive VZV PCR CSF negative [facility] spotted fever antibodies negative  CT Head (Code Stroke 11/5): Trace new left superior convexity subarachnoid hemorrhage (1-2 sulci), without mass effect. Otherwise stable non contrast CT  appearance of the brain: Nonspecific white matter changes, No convincing cortically based infarct by CT. ASPECTS 10.  MRI brain (11/1): Multiple subcortical contrast-enhancing areas greater in the right parieto-occipital fissure but present at multiple subcortical locations in both MCAs. Small areas of diffusion abnormality at the anterior insula on the left, left frontal operculum and right frontal operculum in multiple locations in both corona radiata and some lesions appearing more acute than others based on lower ADC values. Differentials include infectious versus inflammatory encephalitis, VZV or other vasculitis, as well as recent embolic showers, with demyelinating disease being lower on the DDx  Routine EEG: Intermittent generalized slowing  Repeat EEG (11/5): Continuous slow, generalized. This study is suggestive of generalized cerebral dysfunction (encephalopathy). No seizures or epileptiform discharges were seen throughout the recording.  Repeat MRI brain (  11/5):  1. Prominent patchy cortical and subcortical signal abnormality throughout both cerebral hemispheres, overall slightly progressed from 09/09/2024 and indeterminate although encephalitis and vasculitis remain leading considerations. 2. Small volume left parietal subarachnoid hemorrhage.  TTE (11/2): 1. Left ventricular ejection fraction, by estimation, is 55%. Left  ventricular ejection fraction by 3D volume is 52 %. The left ventricle has  normal function. The left ventricle has no regional wall motion  abnormalities. Left ventricular diastolic  parameters are consistent with Grade I diastolic dysfunction (impaired  relaxation). The average left ventricular global longitudinal strain is  -13.3 %.   2. Right ventricular systolic function is normal. The right ventricular  size is normal. Tricuspid regurgitation signal is inadequate for assessing  PA pressure.   3. The mitral valve is normal in structure. Trivial mitral valve   regurgitation. No evidence of mitral stenosis.   4. The aortic valve is tricuspid. Aortic valve regurgitation is mild. No  aortic stenosis is present.   5. The inferior vena cava is normal in size with greater than 50%  respiratory variability, suggesting right atrial pressure of 3 mmHg.    Assessment  Noah Burnett is an 83 y.o. 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 MRI was performed which revealed enhancing and nonenhancing lesions with a broad differential which included VZV vasculitis/encephalitis, inflammatory, other infectious, and paraneoplastic processes. Also of note-had shingles infection which was treated, so a viral encephalitis is felt to be highest on the DDx. Per the literature, approximately 1/400 cases of VZV reactivation present with VZV encephalitis.  - Already on acyclovir for viral encephalitis coverage. Will continue for a total of 14 days per literature search on VZV encephalitis/vasculitis. Also has been started on a 5-day course of high-dose IV methylprednisolone x 5 days (started on 11/6), followed by oral prednisone taper. 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  - CSF results as documented above. Cell count is not suspicious for a meningitis, but is compatible with a vasculitis or viral encephalitis.  He also has increased protein in CSF. Overall, MRI contrast-enhancing lesions are most suspicious for VZV encephalitis/vasculitis, and patient had improved since starting acyclovir, followed by the acute decompensation Wednesday.  Serum VZV PCR is positive, but CSF VZV PCR is negative. - Patient appears to be improving with acyclovir and high dose steroids but has significant issues with nausea and GERD which are impacting his ability to tolerate tube feedings. 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 A1c at goal below 6, LDL near goal.  Goal is less than 70, his LDL is 75. - Encephalopathy and strokes may be able to tie down together with 1 diagnosis of vasculitis, especially VZV vasculitis given recent shingles.  VZV PCR serum was negative. CSF VZV PCR ordered on meningitis/encephalitis panel was negative. CSF VZV Ig G has come back positive.  - Repeat MRI shows prominent patchy cortical and subcortical call signal abnormality throughout both cerebral hemispheres  Acute Neuro Change - Stat CT head and CTA Head and Neck completed Wednesday after he decompensated, with trace new left superior convexity subarachnoid hemorrhage, without mass effect. -  ASA 81 mg discontinued for now  - Repeat MRI Brain on 11/5 shows a small volume left parietal subarachnoid hemorrhage. - rEEG 11/5: Continuous generalized slowing. No seizures or epileptiform discharges were seen throughout the recording.   # For the encephalopathy - Await full testing results from CSF - Continue antiviral for a total of 14 days. Intense drug monitoring: Continue to closely monitor Cr, and dose adjust acyclovir. Continue with ivf to minimize aki on ckd  - Continue high-dose IV methylprednisolone x 5 days (start date 11/6), followed by oral prednisone taper.  - No need for antiseizure medications-no evidence of seizure and EEG unremarkable - Patient appears to be improving at this time, so we will continue current course of treatment  ___________________________________________________________  Patient seen by NP and discussed with MD. Earle FORBES Everitt Clint Abbey , MSN, AGACNP-BC [facility] See Amion for schedule and pager information 09/17/2024 9:40  AM  Electronically signed: Dr. Doral Digangi

## 2024-09-17 NOTE — Plan of Care (Signed)
  Problem: Clinical Measurements: Goal: Will remain free from infection 09/17/2024 0656 by Carin Reyes PARAS, RN Outcome: Progressing 09/17/2024 0529 by Carin Reyes PARAS, RN Outcome: Progressing Goal: Respiratory complications will improve Outcome: Progressing Goal: Cardiovascular complication will be avoided 09/17/2024 0656 by Carin Reyes PARAS, RN Outcome: Progressing 09/17/2024 0529 by Carin Reyes PARAS, RN Outcome: Progressing   Problem: Nutrition: Goal: Adequate nutrition will be maintained 09/17/2024 0656 by Carin Reyes PARAS, RN Outcome: Progressing 09/17/2024 0529 by Carin Reyes PARAS, RN Outcome: Progressing   Problem: Pain Managment: Goal: General experience of comfort will improve and/or be controlled Outcome: Progressing   Problem: Education: Goal: Knowledge of General Education information will improve Description: Including pain rating scale, medication(s)/side effects and non-pharmacologic comfort measures Outcome: Not Progressing   Problem: Activity: Goal: Risk for activity intolerance will decrease Outcome: Not Progressing   Problem: Elimination: Goal: Will not experience complications related to urinary retention Outcome: Not Progressing

## 2024-09-18 ENCOUNTER — Inpatient Hospital Stay (HOSPITAL_COMMUNITY)

## 2024-09-18 DIAGNOSIS — R627 Adult failure to thrive: Secondary | ICD-10-CM | POA: Diagnosis not present

## 2024-09-18 LAB — GLUCOSE, CAPILLARY
Glucose-Capillary: 141 mg/dL — ABNORMAL HIGH (ref 70–99)
Glucose-Capillary: 142 mg/dL — ABNORMAL HIGH (ref 70–99)
Glucose-Capillary: 164 mg/dL — ABNORMAL HIGH (ref 70–99)
Glucose-Capillary: 166 mg/dL — ABNORMAL HIGH (ref 70–99)
Glucose-Capillary: 169 mg/dL — ABNORMAL HIGH (ref 70–99)
Glucose-Capillary: 176 mg/dL — ABNORMAL HIGH (ref 70–99)
Glucose-Capillary: 180 mg/dL — ABNORMAL HIGH (ref 70–99)
Glucose-Capillary: 203 mg/dL — ABNORMAL HIGH (ref 70–99)

## 2024-09-18 LAB — OSMOLALITY, URINE: Osmolality, Ur: 399 mosm/kg (ref 300–900)

## 2024-09-18 LAB — PHOSPHORUS: Phosphorus: 2.6 mg/dL (ref 2.5–4.6)

## 2024-09-18 LAB — RENAL FUNCTION PANEL
Albumin: 2.3 g/dL — ABNORMAL LOW (ref 3.5–5.0)
Anion gap: 9 (ref 5–15)
BUN: 13 mg/dL (ref 8–23)
CO2: 21 mmol/L — ABNORMAL LOW (ref 22–32)
Calcium: 7.4 mg/dL — ABNORMAL LOW (ref 8.9–10.3)
Chloride: 94 mmol/L — ABNORMAL LOW (ref 98–111)
Creatinine, Ser: 0.53 mg/dL — ABNORMAL LOW (ref 0.61–1.24)
GFR, Estimated: >60 mL/min (ref >60)
Glucose, Bld: 164 mg/dL — ABNORMAL HIGH (ref 70–99)
Phosphorus: 2.7 mg/dL (ref 2.5–4.6)
Potassium: 3.2 mmol/L — ABNORMAL LOW (ref 3.5–5.1)
Sodium: 124 mmol/L — ABNORMAL LOW (ref 135–145)

## 2024-09-18 LAB — VDRL, CSF: VDRL Quant, CSF: NONREACTIVE

## 2024-09-18 LAB — MAGNESIUM: Magnesium: 1.6 mg/dL — ABNORMAL LOW (ref 1.7–2.4)

## 2024-09-18 MED ORDER — POTASSIUM CHLORIDE 10 MEQ/100ML IV SOLN
10.0000 meq | INTRAVENOUS | Status: AC
  Administered 2024-09-18 (×6): 10 meq via INTRAVENOUS
  Filled 2024-09-18 (×6): qty 100

## 2024-09-18 MED ORDER — DEXTROSE 5 % IV SOLN
455.0000 mg | Freq: Two times a day (BID) | INTRAVENOUS | Status: DC
Start: 2024-09-18 — End: 2024-09-23
  Administered 2024-09-18 – 2024-09-21 (×7): 455 mg via INTRAVENOUS
  Filled 2024-09-18 (×7): qty 9.1

## 2024-09-18 MED ORDER — MAGNESIUM SULFATE 4 GM/100ML IV SOLN
4.0000 g | Freq: Once | INTRAVENOUS | Status: AC
  Administered 2024-09-18: 4 g via INTRAVENOUS
  Filled 2024-09-18: qty 100

## 2024-09-18 NOTE — Plan of Care (Signed)
  Problem: Self-Care: Goal: Ability to participate in self-care as condition permits will improve Outcome: Progressing   Problem: Self-Care: Goal: Ability to communicate needs accurately will improve Outcome: Progressing   Problem: Health Behavior/Discharge Planning: Goal: Goals will be collaboratively established with patient/family Outcome: Progressing

## 2024-09-18 NOTE — Plan of Care (Signed)
  Problem: Education: Goal: Knowledge of General Education information will improve Description: Including pain rating scale, medication(s)/side effects and non-pharmacologic comfort measures Outcome: Progressing   Problem: Activity: Goal: Risk for activity intolerance will decrease Outcome: Progressing   Problem: Safety: Goal: Ability to remain free from injury will improve Outcome: Progressing   Problem: Nutrition Goal: Patient maintains adequate hydration Outcome: Progressing

## 2024-09-18 NOTE — Progress Notes (Signed)
 Nutrition Follow-up  DOCUMENTATION CODES:   Severe malnutrition in context of chronic illness, Underweight  INTERVENTION:  Resume Osmolite 1.5 at 20 mL/hr. Increase by 10 mL/hr every 12 hours as tolerated to 40 mL/hr goal. Continue ProSource TF20 once daily via feeding tube. This EN regimen provides 1520 Kcals, 80 g protein, 195 g carbohydrates, and 732 mL free water daily, which provides 100% of the patient's estimated nutrition needs. Continue minimal water flushes to maintain feeding tube patency in light of persistent hyponatremia. Continue Thiamine 100 mg PO daily for 7 days. Started on 11/6. Continue Multivitamin PO daily. Daily weights. Please weigh patient and not utilize stated weight. When patient is no longer NPO, resume [employer] 1.4 PO BID. Each supplement provides 455 Kcals and 20 grams protein. Resume Dysphagia 3 diet with thin liquids when no longer NPO - advancement per SLP.  NUTRITION DIAGNOSIS:   Severe Malnutrition related to chronic illness as evidenced by energy intake < or equal to 75% for > or equal to 1 month, severe muscle depletion, severe fat depletion, percent weight loss. - remains applicable  GOAL:   Patient will meet greater than or equal to 90% of their needs - not currently being met, progressing  MONITOR:   PO intake, Supplement acceptance, Diet advancement, Labs, Weight trends, TF tolerance   REASON FOR ASSESSMENT:   Follow-up for: Consult Assessment of nutrition requirement/status  ASSESSMENT:   83 yo male admitted with failure to thrive. 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  11/3 - patient's son and daughter at bedside report poor PO intake since duodenal mass diagnosis in July with worsening intake X 6 weeks prior to admission due to shingles with only bites of food. 50% of meals/ONS reported this  day with improved ONS intake when changed to [employer].  11/6 - The patient was downgraded from reg diet to Dys 3/thin s/p SLP eval on 11/5. Visited the patient with his daughter [name] at bedside. [name] states the patient did not eat yesterday and just kept sleeping. He only took bites of breakfast today and has not wanted to drink [employer]. He will not drink anything with dairy in it because it affects his bowel movements. Suggested switching ONS to [employer]. Discussed Cortrak and EN with the patient and [name] since the patient has had poor PO intake for a few months and is not currently improving. [name] agreed to pursue it. Epic chat Dr Vernon who has placed orders for Cortrak.  11/7 - Cortrak placed, EN initiated  11/8 - TF goal of 40 mL/hr reached  11/9 - N/V reported with abdominal pain. The patient then had 2 bowel movements. KUB ordered. TF stopped per attending.  Update: S/P repeat KUB this morning showing no ileus but suspicion that enteric tube tip is projecting over the lower abdomen, likely in the duodenum per radiology. Dr Vernon believes the tip is in the stomach and OK restarting of TF slowly. D/W RN. To keep patient NPO for now except for medications. The patient continues with minimal PO intake since last follow-up and remains on Dysphagia 3 diet with thin liquids. CIR discharge pending resolution of suspected ileus. Palliative continues to follow with today's discussion with daughter resulting in continued full code.   Scheduled Meds:  Chlorhexidine Gluconate Cloth  6 each Topical Daily   enoxaparin (LOVENOX) injection  30 mg Subcutaneous Q24H  feeding supplement (KATE FARMS STANDARD ENT 1.4)  325 mL Oral BID BM   feeding supplement (PROSource TF20)  60 mL Per Tube Daily   lidocaine   1 Application Urethral Once   multivitamin with minerals  1 tablet Oral Daily   pantoprazole   40 mg Oral Daily   polyethylene glycol  17 g Oral Daily   [START ON 09/19/2024]  predniSONE  50 mg Oral Q breakfast   rosuvastatin  10 mg Oral Daily   senna  2 tablet Oral Daily   sodium chloride   2 g Oral TID WC   tamsulosin  0.4 mg Oral QPC supper   thiamine  100 mg Oral Daily   vitamin B-12  500 mcg Oral Daily   Continuous Infusions:  sodium chloride  50 mL/hr at 09/17/24 1721   acyclovir (ZOVIRAX) 455 mg in dextrose  5 % 100 mL IVPB 455 mg (09/18/24 1333)   feeding supplement (OSMOLITE 1.5 CAL) 1,000 mL (09/18/24 1345)   methylPREDNISolone (SOLU-MEDROL) injection 1,000 mg (09/17/24 1723)   potassium chloride 10 mEq (09/18/24 1345)    Diet Order             Diet NPO time specified Except for: Sips with Meds  Diet effective now                  Meal Intake: Minimal to zero  Labs:     Latest Ref Rng & Units 09/18/2024    2:54 AM 09/17/2024   10:46 AM 09/17/2024    1:30 AM  CMP  Glucose 70 - 99 mg/dL 835  844  804   BUN 8 - 23 mg/dL 13  15  15    Creatinine 0.61 - 1.24 mg/dL 9.46  9.44  9.40   Sodium 135 - 145 mmol/L 124  126  124   Potassium 3.5 - 5.1 mmol/L 3.2  3.6  3.3   Chloride 98 - 111 mmol/L 94  95  93   CO2 22 - 32 mmol/L 21  19  20    Calcium 8.9 - 10.3 mg/dL 7.4  7.3  7.3      I/O: +2.5 L since admit  NUTRITION - FOCUSED PHYSICAL EXAM:  Flowsheet Row Most Recent Value  Orbital Region Moderate depletion  Upper Arm Region Severe depletion  Thoracic and Lumbar Region Moderate depletion  Buccal Region Severe depletion  Temple Region Severe depletion  Clavicle Bone Region Severe depletion  Clavicle and Acromion Bone Region Severe depletion  Scapular Bone Region Moderate depletion  Dorsal Hand Moderate depletion  Patellar Region Moderate depletion  Anterior Thigh Region Severe depletion  Posterior Calf Region Severe depletion  Hair Reviewed  Eyes Reviewed  Mouth Reviewed  Skin Reviewed  Nails Reviewed    EDUCATION NEEDS:   Not appropriate for education at this time  Skin:  Skin Assessment: Reviewed RN Assessment  Last  BM:  11/9 type 4  Height:   Ht Readings from Last 1 Encounters:  09/08/24 5' 7 (1.702 m)    Weight:   Latest weight charted as 'stated' weight.  18% weight loss in 3 months  Ideal Body Weight:  67.3 kg  BMI:  Body mass index is 17.61 kg/m.  Estimated Nutritional Needs:  Kcal:  1500-1700 Protein:  75-90 gm Fluid:  1.5-1.7 L    Leverne Ruth, MS, RDN, LDN [place] See AMION for contact information

## 2024-09-18 NOTE — Progress Notes (Signed)
 PROGRESS NOTE    Noah Burnett  FMW:[id number] DOB: [birth year] DOA: 09/08/2024 PCP: [name], MD   Brief Narrative:  83 year old male with a history of prostate cancer (on observation), tobacco abuse, hypertension, GERD, duodenal mass resulting in gastric L obstruction status post robotic gastrojejunostomy 07/03/2024 presenting with decreased oral intake, generalized weakness, and confusion.   Patient had hospital admission at [facility] from 8/18 to 07/09/24 when he presented with nausea and vomiting.  He was found to have gastric outlet obstruction and duodenal mass. He had an EGD that showed an hiatal hernia and compression of the second portion of the duodenum with a possible intraluminal neoplasm. Biopsies were obtained which showed severely inflamed and ulcerated duodenal mucosa, but no evidence of malignancy. A second EGD was done with ultrasound guidance, but again pathology of the lesion extensive necrosis. Surgical oncology was consulted and he underwent a robotic gastrojejunostomy on 8/25. He did have a delayed return of bowel function and had a PICC line with TPN for several days until he had advancement of his diet. Since his hospitalization, he has had a fairly steady functional decline with decreasing appetite, significant weight loss, and generalized weakness.  After discharge, the patient was tolerating a diet and still driving and walking independently.   Unfortunately, the patient developed shingles in the last week of September 2025.  He went to see ophthalmology, and he was cleared from their standpoint.  He finished a course of Valtrex.  He continues to have some postherpetic neuralgia.  Daughter states that since his shingles episode, the patient has had a functional decline with decreasing appetite, significant weight loss, and generalized weakness.   The patient has followed up with surgical oncology on 08/31/2024.  At that time the patient had reported generalized  weakness and fatigue, but he did not have any vomiting or abdominal pain at that time.  He had poor p.o. intake and loss of appetite at that time.  Repeat MRI of the abdomen and pelvis performed on 09/06/2024 which showed no specific MR findings to suggest a duodenal mass.  There was Generalized edema-like signal in the retroperitoneum is presumed reactive in the setting of known duodenal ulceration.  There were multiple benign appearing hepatic lesions presumed to be cysts or hemangioma.   Daughter states that in the past week he has had increasing confusion and generalized weakness with continued decreased oral intake but no other specific symptoms. UA was negative for pyuria.  CT of the brain was negative for acute findings.  Acute abdominal series was negative for obstruction or pneumoperitoneum.   Neurology consulted for evaluation of acute encephalopathy. His hospital course complicated by acute on chronic hyponatremia. Nephrology consulted and he was started on salt tablets.  Further details as below.   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  Differential include encephalitis versus vasculitis versus demyelinating disease vs stroke. Neurology consulted and patient was [facility] from [facility]. Patient underwent fluoroscopic guided lumbar puncture and fluid sent for analysis for evaluation of meningitis and encephalitis. Procalcitonin is negative, CRP is 4.1. EEG showed diffuse  cerebral dysfunction, no epileptiform  discharges seen throughout the recording. VZVPCR is positive.  Varicella IgG negative.  Other workup negative result.  Early morning of 09/13/2024, patient noted to have left gaze preference and decreased responsiveness , code stroke was called, patient was seen by neurology.  Stat CT head was done which shows left superior convexity subarachnoid hemorrhage without mass effect.  Otherwise no changes from previous CT head.  CT angiogram ruled out large vessel occlusion.  Patient was kept n.p.o., SLP consulted.  Eventually MRI completed which showed Prominent patchy cortical and subcortical signal abnormality throughout both cerebral hemispheres, overall slightly progressed from 09/09/2024 and indeterminate although encephalitis and vasculitis remain leading considerations.  ID recommended continuing acyclovir.  Patient's mental status improved significantly on 09/14/2024.  ID and neuro both suspecting varicella encephalitis as likely etiology of patient's symptoms and recommended 14 days of total acyclovir.  Patient was also started on high-dose methylprednisolone by neurology 09/14/2024 with the plan to continue this for 5 days followed by taper of prednisone.    Chronic hyponatremia: Per nephrology, possibly from SIADH. Sodium in August, 2025 was 129.  Patient was admitted with a sodium of 126 on 10/21.TSH slightly elevated. Am cortisol 30. Serum osmo is 257, urine sodium is 137, urine osmolality is 490.  And on salt tablets 1 g 3 times daily.  Nephrology signed off.  Patient's sodium dropped to 124.  On 09/17/2024, discussed with Dr. Geralynn of nephrology and since patient needs some IV fluids to prevent AKI while he is on IV acyclovir, nephrology okayed to continue at 50 cc/h and an creased salt tablets to 2 g 3 times daily.  Acute urinary retention: On the evening of 09/13/2024, patient was retaining urine, bladder scan showed 900 cc.  Orders were given to the nurses for straight cath however that was  unsuccessful so coud catheter was placed.  Started on Flomax.  Nephrology signed off 09/15/2024.  AKI: Creatinine jumped from 0.76 to 1.66 to 2.07 09/14/2024, nephrology was on board, patient given IV fluids, AKI was presumed to be secondary to obstructive uropathy as well as contrast induced.  AKI resolved 09/15/2024 and nephrology signed off.    GERD/ Duodenal ulceration, stricture and mass s/p  robotic gastrojejunostomy on 8/25. Continue with PPI  Nausea vomiting/abdominal pain/ileus: Patient started complaining of abdominal pain and nausea and vomiting on the morning of 09/17/2024, patient had no tenderness and he was having bowel movements as well.  Abdominal x-ray showed ileus.  Tube feeds were stopped.  Waiting for ileus to resolve.  Patient clinically does not appear to have ileus, no more nausea or vomiting, abdomen soft and nontender, positive bowel sounds, repeating KUB.   Hypertension: On hydrochlorothiazide at home which is on hold, blood pressure controlled.   Hypokalemia: Low again, will replenish.  Hypophosphatemia: Solved.  Hypomagnesemia: Low again, will replenish.  Recently diagnosed Shingles On Cyclivert.  GOC: Patient remains full code.  On 09/13/2024, I discussed with the son, he wants the patient to be full code however he told me that the siblings are having detailed conversation about the CODE STATUS and this might change.  Palliative is also following.   DVT prophylaxis: enoxaparin (LOVENOX) injection 30 mg Start: 09/09/24 2200   Code Status: Full Code  Family Communication: Daughter present at bedside.  Plan of care discussed with son and he was already aware about the plan by neurology.  Status is: Inpatient Remains inpatient appropriate because: Patient now has ileus, CIR wants this ileus to  be resolved before accepting   Estimated body mass index is 17.61 kg/m as calculated from the following:   Height as of this encounter: 5' 7 (1.702 m).   Weight as of  this encounter: 51 kg.    Nutritional Assessment: Body mass index is 17.61 kg/m.SABRA Seen by dietician.  I agree with the assessment and plan as outlined below: Nutrition Status: Nutrition Problem: Severe Malnutrition Etiology: chronic illness Signs/Symptoms: energy intake < or equal to 75% for > or equal to 1 month, severe muscle depletion, severe fat depletion, percent weight loss Percent weight loss: 18 % (in 3 months) Interventions: MVI, Tube feeding, Prostat  . Skin Assessment: I have examined the patient's skin and I agree with the wound assessment as performed by the wound care RN as outlined below:    Consultants:  Neurology  Procedures:  As above  Antimicrobials:  Anti-infectives (From admission, onward)    Start     Dose/Rate Route Frequency Ordered Stop   09/15/24 1000  acyclovir (ZOVIRAX) 455 mg in dextrose  5 % 100 mL IVPB        10 mg/kg  45.6 kg 109.1 mL/hr over 60 Minutes Intravenous Every 12 hours 09/15/24 0826     09/13/24 2200  acyclovir (ZOVIRAX) 455 mg in dextrose  5 % 100 mL IVPB  Status:  Discontinued        10 mg/kg  45.6 kg 109.1 mL/hr over 60 Minutes Intravenous Every 24 hours 09/13/24 1923 09/15/24 0826   09/13/24 1800  valACYclovir (VALTREX) tablet 1,000 mg  Status:  Discontinued        1,000 mg Oral Daily after supper 09/13/24 0846 09/13/24 1358   09/09/24 1500  acyclovir (ZOVIRAX) 455 mg in dextrose  5 % 100 mL IVPB  Status:  Discontinued        10 mg/kg  45.6 kg 109.1 mL/hr over 60 Minutes Intravenous Every 12 hours 09/09/24 1331 09/13/24 0846         Subjective: Patient seen and examined, daughter at the bedside.  Patient fully alert and oriented.  Following commands.  Strength is improving.  Denies any complaints.  Objective: Vitals:   09/18/24 0000 09/18/24 0353 09/18/24 0500 09/18/24 0832  BP: 108/67 (!) 122/103  (!) 148/81  Pulse: (!) 56 76  61  Resp: 16 18  17   Temp: 97.9 F (36.6 C) 97.9 F (36.6 C)  97.6 F (36.4 C)   TempSrc: Oral Oral  Oral  SpO2: 95% 96%  96%  Weight:   51 kg   Height:        Intake/Output Summary (Last 24 hours) at 09/18/2024 0834 Last data filed at 09/18/2024 0600 Gross per 24 hour  Intake 1856.27 ml  Output 2800 ml  Net -943.73 ml   Filed Weights   09/08/24 1626 09/15/24 0500 09/18/24 0500  Weight: 45.6 kg 45.4 kg 51 kg    Examination:  General exam: Appears calm and comfortable  Respiratory system: Clear to auscultation. Respiratory effort normal. Cardiovascular system: S1 & S2 heard, RRR. No JVD, murmurs, rubs, gallops or clicks. No pedal edema. Gastrointestinal system: Abdomen is nondistended, soft and nontender. No organomegaly or masses felt. Normal bowel sounds heard. Central nervous system: Alert and oriented.  Some weakness in right upper and lower extremity, some dysarthria.  Overall improving. Skin: No rashes, lesions or ulcers.    Data Reviewed: I have personally reviewed following labs and imaging studies  CBC: Recent Labs  Lab 09/12/24 0158 09/13/24 0155 09/14/24 0244 09/15/24 0141  WBC 11.2* 13.7* 11.6* 7.9  NEUTROABS 9.6* 12.1* 9.8* 7.3  HGB 15.5 15.4 13.1 12.9*  HCT 43.6 43.7 36.8* 37.4*  MCV 84.7 85.7 85.0 87.8  PLT 234 227 198 178   Basic Metabolic Panel: Recent Labs  Lab 09/13/24 0155 09/14/24 0244 09/14/24 0244 09/15/24 0141 09/16/24 0314 09/17/24 0130 09/17/24 1046 09/18/24 0254 09/18/24 0255  NA 128* 129*  --  129* 129* 124* 126* 124*  --   K 4.1 4.0  --  3.3* 3.8 3.3* 3.6 3.2*  --   CL 93* 99  --  98 99 93* 95* 94*  --   CO2 23 21*  --  19* 20* 20* 19* 21*  --   GLUCOSE 147* 134*  --  163* 209* 195* 155* 164*  --   BUN 17 24*  --  14 16 15 15 13   --   CREATININE 1.61* 2.07*  --  0.79 0.68 0.59* 0.55* 0.53*  --   CALCIUM 8.1* 7.8*  --  7.8* 7.6* 7.3* 7.3* 7.4*  --   MG 2.5* 2.2  --   --  1.7 1.7  --   --  1.6*  PHOS  --   --    < > 3.8 2.1* 1.9*  1.9* 2.1* 2.7 2.6   < > = values in this interval not displayed.    GFR: Estimated Creatinine Clearance: 52.2 mL/min (A) (by C-G formula based on SCr of 0.53 mg/dL (L)). Liver Function Tests: Recent Labs  Lab 09/12/24 0158 09/13/24 0155 09/14/24 0244 09/15/24 0141 09/17/24 0130 09/17/24 1046 09/18/24 0254  AST 21 23 19   --   --   --   --   ALT 14 12 14   --   --   --   --   ALKPHOS 66 63 56  --   --   --   --   BILITOT 1.4* 1.0 1.0  --   --   --   --   PROT 5.6* 5.3* 4.6*  --   --   --   --   ALBUMIN 3.0* 3.0* 2.4* 2.4* 2.3* 2.3* 2.3*   No results for input(s): LIPASE, AMYLASE in the last 168 hours.  No results for input(s): AMMONIA in the last 168 hours. Coagulation Profile: No results for input(s): INR, PROTIME in the last 168 hours.  Cardiac Enzymes: No results for input(s): CKTOTAL, CKMB, CKMBINDEX, TROPONINI in the last 168 hours.  BNP (last 3 results) No results for input(s): PROBNP in the last 8760 hours. HbA1C: No results for input(s): HGBA1C in the last 72 hours. CBG: Recent Labs  Lab 09/18/24 0015 09/18/24 0124 09/18/24 0429 09/18/24 0603 09/18/24 0830  GLUCAP 203* 164* 166* 169* 142*   Lipid Profile: No results for input(s): CHOL, HDL, LDLCALC, TRIG, CHOLHDL, LDLDIRECT in the last 72 hours. Thyroid Function Tests: No results for input(s): TSH, T4TOTAL, FREET4, T3FREE, THYROIDAB in the last 72 hours. Anemia Panel: No results for input(s): VITAMINB12, FOLATE, FERRITIN, TIBC, IRON, RETICCTPCT in the last 72 hours. Sepsis Labs: Recent Labs  Lab 09/12/24 0158 09/13/24 0155 09/14/24 0244  PROCALCITON <0.10 0.10 <0.10    Recent Results (from the past 240 hours)  CSF culture w Gram Stain     Status: None   Collection Time: 09/10/24  7:10 AM   Specimen: CSF; Cerebrospinal Fluid  Result Value Ref Range Status   Specimen Description CSF  Final   Special Requests NONE  Final   Gram Stain  Final    WBC PRESENT, PREDOMINANTLY MONONUCLEAR NO ORGANISMS  SEEN CYTOSPIN SMEAR    Culture   Final    NO GROWTH 3 DAYS Performed at [facility], [address], [place], [place] [address]    Report Status 09/13/2024 FINAL  Final  VZV PCR, CSF     Status: None   Collection Time: 09/11/24  8:39 AM   Specimen: Cerebrospinal Fluid  Result Value Ref Range Status   VZV PCR, CSF Negative Negative Final    Comment: (NOTE) No Varicella Zoster Virus DNA detected. Performed At: [facility] [address] [place], [place] [id number] Jennette Shorter MD Ey:[id number]   Varicella-zoster by PCR     Status: Abnormal   Collection Time: 09/11/24 11:50 AM   Specimen: Blood  Result Value Ref Range Status   Varicella-Zoster, PCR Positive (A) Negative Final    Comment: (NOTE) Varicella Zoster Virus DNA detected. This test was developed and its performance characteristics determined by [facility]. It has not been cleared or approved by the Food and Drug Administration. Performed At: [facility] [address] [place], [place] [id number] Jennette Shorter MD Ey:[id number]      Radiology Studies: DG Abd 1 View Result Date: 09/17/2024 CLINICAL DATA:  Small bowel obstruction. EXAM: DG ABDOMEN 1V COMPARISON:  Lymph 725. FINDINGS: Gaseous prominence of small bowel and colon. Gas in the rectum. Feeding tube terminates in the distal stomach. IMPRESSION: Ileus. Electronically Signed   By: Newell Eke M.D.   On: 09/17/2024 15:48   US  EKG SITE RITE Result Date: 09/16/2024 If Site Rite image not attached, placement could not be confirmed due to current cardiac rhythm.   Scheduled Meds:  Chlorhexidine Gluconate Cloth  6 each Topical Daily   enoxaparin (LOVENOX) injection  30 mg Subcutaneous Q24H   feeding supplement (KATE FARMS STANDARD ENT 1.4)  325 mL Oral BID BM   feeding supplement (PROSource TF20)  60 mL Per Tube Daily   lidocaine   1 Application Urethral Once   multivitamin with minerals  1 tablet Oral Daily   pantoprazole   40 mg Oral  Daily   polyethylene glycol  17 g Oral Daily   [START ON 09/19/2024] predniSONE  50 mg Oral Q breakfast   rosuvastatin  10 mg Oral Daily   senna  2 tablet Oral Daily   sodium chloride   2 g Oral TID WC   tamsulosin  0.4 mg Oral QPC supper   thiamine  100 mg Oral Daily   vitamin B-12  500 mcg Oral Daily   Continuous Infusions:  sodium chloride  50 mL/hr at 09/17/24 1721   acyclovir (ZOVIRAX) 455 mg in dextrose  5 % 100 mL IVPB 455 mg (09/17/24 2302)   feeding supplement (OSMOLITE 1.5 CAL) Stopped (09/17/24 0730)   magnesium sulfate bolus IVPB     methylPREDNISolone (SOLU-MEDROL) injection 1,000 mg (09/17/24 1723)   potassium chloride       LOS: 10 days   Fredia Skeeter, MD [facility]  09/18/2024, 8:34 AM   *Please note that this is a verbal dictation therefore any spelling or grammatical errors are due to the Dragon Medical One system interpretation.  Please page via Amion and do not message via secure chat for urgent patient care matters. Secure chat can be used for non urgent patient care matters.  How to contact the [facility] Attending or Consulting provider 7A - 7P or covering provider during after hours 7P -7A, for this patient?  Check the care team in [facility]  and look for a) attending/consulting [facility] provider listed and b) the [facility] team listed. Page or secure chat 7A-7P. Log into [email or web address] and use [place]'s universal password to access. If you do not have the password, please contact the hospital operator. Locate the [facility] provider you are looking for under [facility] and page to a number that you can be directly reached. If you still have difficulty reaching the provider, please page the [facility] (Director on Call) for the Hospitalists listed on amion for assistance.

## 2024-09-18 NOTE — Progress Notes (Signed)
   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 Palliative care has been asked to support goals of care conversations.   Today's Discussion 09/18/2024  *Please note that this is a verbal dictation therefore any spelling or grammatical errors are due to the Dragon Medical One system interpretation.  Chart reviewed inclusive of vital signs, progress notes of Dr. Vernon, Dr. Luiz - ID, Macario Soja - PT, Chyrl Conger - RN, laboratory results Na 124, K 3.2, CHL 94, CO2 21, Glu 164, BUN 13, Cr 0.53, Ca 7.4, Anion Gap 9, GFR > 60, and diagnostic images - Abd Xray this morning does not appear to show obstructive process  I met with Noah Burnett and his daughter, Noah Burnett at bedside this morning. We reviewed that patients discharge to CIR has been halted due to his ileus.   Noah Burnett confirms the goals for Full Code / Full scope of care. She shares that she and her family have not discussed a long term feeding tube if it were needed but she notes her father has always been a strong man and had a strong will. She is very hopeful for a good outcomes.   Questions and concerns addressed/Palliative Support Provided.   Objective Assessment: Vital Signs Vitals:   09/18/24 0832 09/18/24 1206  BP: (!) 148/81 (!) 163/73  Pulse: 61 (!) 52  Resp: 17 18  Temp: 97.6 F (36.4 C) (!) 97.5 F (36.4 C)  SpO2: 96% 100%    Intake/Output Summary (Last 24 hours) at 09/18/2024 1215 Last data filed at 09/18/2024 1041 Gross per 24 hour  Intake 825.56 ml  Output 3200 ml  Net -2374.44 ml   Last Weight  Most recent update: 09/18/2024  6:19 AM    Weight  51 kg (112 lb 7 oz)            Gen: Elderly Caucasian male chronically ill in  appearance HEENT: Coretrack in place, Dry mucous membranes CV: Regular rate and rhythm PULM: On room air breathing is even and nonlabored ABD: soft/nontender EXT: No edema Neuro: Aware of self and that we are in [place]  SUMMARY OF RECOMMENDATIONS   Full code/full scope of care   Allowing time for outcomes   Emotional support provided  Plan for CIR once patient is optimized   Ongoing palliative care support as needed ______________________________________________________________________________________ Rosaline Becton [place]xton Palliative Medicine Team Team Cell Phone: [phone] Please utilize secure chat with additional questions, if there is no response within 30 minutes please call the above phone number  Billing based on MDM: Moderate   Palliative Medicine Team providers are available by phone from 7am to 7pm daily and can be reached through the team cell phone.  Should this patient require assistance outside of these hours, please call the patient's attending physician.

## 2024-09-18 NOTE — H&P (Shared)
 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  He had an EGD that showed hiatal hernia and compression of the second portion of duodenum with possible intraluminal neoplasm.  Biopsies were obtained which showed severely inflamed and ulcerated duodenal mucosa, but no evidence of malignancy.  A second EGD was done with ultrasound guidance but again pathology of the lesion extensive necrosis.  Surgical oncology consulted underwent robotic gastrojejunostomy on 8/25.  He did have delayed return of bowel function and a PICC line with TPN for several days until he had advancement of his diet.  Since his procedure he had had fairly steady decline with decreasing appetite significant weight loss and inability to walk.  He also had follow-up surgical oncology.  He had repeat MRI which did not show the duodenal mass.  Per chart review patient lives alone.  1 level home one-step to entry.  He has required assist from his family for ADLs and use of a rolling walker for mobility the past 2 weeks and has experienced 1 fall in the past 2 weeks.  Presented 09/08/2024 to [facility] with altered mental status and unwitnessed fall as well as reports of generalized weakness and fatigue.  In the ED abdominal x-ray showed no evidence of obstruction.  Cranial CT scan negative for acute changes.  MRI showed multifocal subcortical contrast-enhancement, greatest at the right parietal-occipital fissure but present at multiple subcortical locations in both hemispheres.  Small areas of diffusion abnormality at the anterior  left insula, left frontal operculum, right frontal operculum, and in multiple locations of both corona radiata and some lesions appearing more acute than others based on lower ADC values.  There was multiple possibilities raised by above findings considerations infectious versus inflammatory encephalitis versus vasculitis in CSF.  Patient was transferred to [facility] for further evaluation.  Chemistries unremarkable except sodium 128 potassium 3.4 chloride 90 AST 14, total CK of 30, TSH 4.632, a.m. cortisol 30, serum osmolality 257, urine sodium 137, urine osmolality 490 urinalysis negative for pyuria.  Neurology was consulted for evaluation of acute encephalopathy as well as nephrology consulted for hyponatremia.  Patient underwent fluoroscopic guided lumbar puncture and fluid sent for analysis for evaluation of meningitis encephalitis.  Procalcitonin negative, CRP 4.1.  EEG showed diffuse cerebral dysfunction no seizure seen throughout the recording.  VZV PCR was positive, varicella IgG negative.  On the morning of 09/13/2024 patient noted to have left gaze preference and decreased responsiveness code stroke was called patient was seen by neurology services.  Stat CT was done which showed left superior convexity subarachnoid hemorrhage without mass effect.  Otherwise no changes from previous CT.  CT angiogram ruled out large vessel occlusion.  Patient was initially kept n.p.o. follow-up speech therapy.  Eventually MRI completed showed prominent patchy cortical and subcortical signal abnormality throughout both hemispheres overall slight progressed from 09/09/2024 and intermediate although encephalitis and vasculitis remain leading considerations.  ID recommended continuing acyclovir.  Patient's mental status continued to improve.  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  Patient was also started on high-dose  methylprednisolone by neurology 09/14/2024 with the plan to continue for 5 days followed by taper of prednisone. In regards to patient's hyponatremia felt to be chronic with nephrology follow-up possibly SIADH noted sodium in August 2025 was 129 and latest sodium 124 09/18/2024 and currently remains on sodium chloride  tablets.  Nephrology services signed off 09/15/2024. Bouts of urinary retention on the evening of 09/13/2024 patient was retaining urine bladder scan for 900 cc orders given for nurse for straight caths however that was unsuccessful so coud catheter was placed and started on Flomax. AKI creatinine jumped from 0.76-1 0.66-2.07 09/14/2024 nephrology had been following at that time given IV fluids AKI presumed to be secondary to obstructive uropathy as well as contrast-induced.  AKI resolved 09/15/2024. Patient was cleared to begin Lovenox for DVT prophylaxis. His diet has been advanced to mechanical soft initially requiring tube feeds for nutritional support.  Palliative care was consulted to establish goals of care.  Patient did have some persistent nausea and vomiting with KUB 09/17/2024 showing ileus. Therapy evaluations completed due to patient decreased functional mobility was admitted for a comprehensive rehab program.    Review of Systems  Constitutional:  Positive for malaise/fatigue. Negative for fever.  HENT:         Decreased hearing on the left  Eyes:  Negative for blurred vision and double vision.  Respiratory:  Negative for cough, shortness of breath and wheezing.   Cardiovascular:  Negative for chest pain and leg swelling.  Gastrointestinal:  Positive for constipation, nausea and vomiting.       GERD  Genitourinary:  Positive for frequency and urgency. Negative for dysuria, flank pain and hematuria.  Musculoskeletal:  Positive for back pain, falls and myalgias.  Skin:  Negative for rash.  Neurological:  Positive for dizziness and weakness.  All other systems reviewed and  are negative.  Past Medical History:  Diagnosis Date   Arthritis    Bladder tumor    Deafness, left    Full dentures    GERD (gastroesophageal reflux disease)    History of [place] spotted fever    01-25-2020  per pt had in 2018 and was treated w/ no residual   Hyperplasia of prostate with lower urinary tract symptoms (LUTS)    Hypertension    followed by pcp   (01-25-2020  per pt had stress test approx. 2005, told normal , done in [place] somewhere)   Pre-diabetes    followed by pcp ,   watches diet   Prostate cancer [facility]) urologist--- dr watt   dx by bx01-13-2020,  Gleason 3+4 ;  MRI 12-19-2018, vol 96.26;   active survillance   Shingles    Wears glasses    Wears hearing aid in right ear    Past Surgical History:  Procedure Laterality Date   SHOULDER ARTHROSCOPY WITH ROTATOR CUFF REPAIR AND SUBACROMIAL DECOMPRESSION Right 05-20-2009  @[facility]    TRANSURETHRAL RESECTION OF BLADDER TUMOR WITH MITOMYCIN -C N/A 02/01/2020   Procedure: TRANSURETHRAL RESECTION OF BLADDER TUMOR;  Surgeon: Watt Rush, MD;  Location: [facility];  Service: Urology;  Laterality: N/A;   TRANSURETHRAL RESECTION OF PROSTATE     History reviewed. No pertinent family history. Social History:  reports that he quit smoking about 25 years ago. His smoking use included cigarettes. He started smoking about 61 years ago. He has a 36 pack-year smoking history. He has been exposed to tobacco smoke. He has never used smokeless tobacco. He  reports that he does not currently use alcohol. He reports that he does not use drugs. Allergies:  Allergies  Allergen Reactions   Haemophilus Influenzae Vaccines Other (See Comments)    I get deathly sick   Medications Prior to Admission  Medication Sig Dispense Refill   acetaminophen  (TYLENOL ) 500 MG tablet Take 1,000 mg by mouth every 6 (six) hours as needed for headache or mild pain (pain score 1-3).     docusate sodium (COLACE) 100 MG capsule Take 100  mg by mouth 2 (two) times daily.     hydrochlorothiazide (HYDRODIURIL) 12.5 MG tablet Take 12.5 mg by mouth daily.     megestrol (MEGACE) 40 MG/ML suspension Take 400 mg by mouth every morning.     omeprazole (PRILOSEC) 40 MG capsule Take 40 mg by mouth daily.     polyethylene glycol (MIRALAX / GLYCOLAX) 17 g packet Take 17 g by mouth daily.        Home: Home Living Family/patient expects to be discharged to:: Private residence Living Arrangements: Alone Available Help at Discharge: Family, Available PRN/intermittently (they check in on him daily) Type of Home: House Home Access: Stairs to enter [employer] of Steps: 1 Entrance Stairs-Rails: None Home Layout: One level Bathroom Shower/Tub: Health Visitor: Handicapped height Bathroom Accessibility: Yes Home Equipment: Information systems manager, Agricultural Consultant (2 wheels), Wheelchair - manual  Lives With: Alone   Functional History: Prior Function Prior Level of Function : Needs assist, Driving, History of Falls (last six months) Mobility Comments: RW for the last 2 weeks, no AD prior to that, fall morning prior to coming to ED ADLs Comments: ind with ADLs 2 weeks ago; family does meals, drives  Functional Status:  Mobility: Bed Mobility Overal bed mobility: Needs Assistance Bed Mobility: Supine to Sit Supine to sit: Min assist, Used rails Sit to supine: Mod assist General bed mobility comments: increased time and min assist Transfers Overall transfer level: Needs assistance Equipment used: Rolling walker (2 wheels) Transfers: Sit to/from Stand Sit to Stand: Min assist Bed to/from chair/wheelchair/BSC transfer type:: Step pivot Step pivot transfers: Min assist, +2 safety/equipment General transfer comment: min assist to stand and to steady while standing. cues for hand placement; from EOB and recliner Ambulation/Gait Ambulation/Gait assistance: Min assist Gait Distance (Feet): 30 Feet (sitting rest;  30) Assistive device: Rolling walker (2 wheels) Gait Pattern/deviations: Narrow base of support, Step-through pattern, Decreased stride length, Trunk flexed General Gait Details: stayed in his room as no +2 for chair follow. Pt self-selected to sit and rest after 1st lap 30 ft. required min assist for steering Rw to avoid obstacles Gait velocity: decreased    ADL: ADL Overall ADL's : Needs assistance/impaired Eating/Feeding: Set up, Sitting Eating/Feeding Details (indicate cue type and reason): assistance opening containers Grooming: Wash/dry hands, Wash/dry face, Oral care, Moderate assistance Grooming Details (indicate cue type and reason): stood at sink with min assist and required mod assist to complete dentures care Upper Body Bathing: Minimal assistance, Sitting Upper Body Bathing Details (indicate cue type and reason): at sink Lower Body Bathing: Moderate assistance, Sit to/from stand Lower Body Bathing Details (indicate cue type and reason): assistance with peri area bathing while standing Upper Body Dressing : Minimal assistance, Sitting Upper Body Dressing Details (indicate cue type and reason): gown change Lower Body Dressing: Maximal assistance, Sitting/lateral leans Lower Body Dressing Details (indicate cue type and reason): changed socks with patient assisting with doffing Toilet Transfer: Minimal assistance, Moderate assistance, +2 for safety/equipment, +2 for  physical assistance, Ambulation Toilet Transfer Details (indicate cue type and reason): 2 person HHA Toileting- Clothing Manipulation and Hygiene: Maximal assistance Functional mobility during ADLs: Minimal assistance, Moderate assistance General ADL Comments: self care performed at sink with cues for sequencing  Cognition: Cognition Overall Cognitive Status: Impaired/Different from baseline Arousal/Alertness: Awake/alert Orientation Level: Oriented to time Attention: Sustained Sustained Attention:  Impaired Sustained Attention Impairment: Verbal basic, Functional basic Memory: Impaired Memory Impairment: Decreased short term memory, Decreased recall of new information, Retrieval deficit Decreased Short Term Memory: Verbal basic, Functional basic Awareness: Appears intact Problem Solving: Appears intact Safety/Judgment: Appears intact Cognition Arousal: Alert Behavior During Therapy: Flat affect Overall Cognitive Status: Impaired/Different from baseline  Physical Exam: Blood pressure (!) 122/103, pulse 76, temperature 97.9 F (36.6 C), temperature source Oral, resp. rate 18, height 5' 7 (1.702 m), weight 51 kg, SpO2 96%. Physical Exam Neurological:     Comments: Patient is alert although appears fatigued.  Makes eye contact with examiner.  Follows simple commands.  He is able to answer basic questions in regards to name and place.     Results for orders placed or performed during the hospital encounter of 09/08/24 (from the past 48 hours)  Glucose, capillary     Status: Abnormal   Collection Time: 09/16/24  7:57 AM  Result Value Ref Range   Glucose-Capillary 189 (H) 70 - 99 mg/dL    Comment: Glucose reference range applies only to samples taken after fasting for at least 8 hours.   Comment 1 Notify RN    Comment 2 Document in Chart   Glucose, capillary     Status: Abnormal   Collection Time: 09/16/24 11:06 AM  Result Value Ref Range   Glucose-Capillary 223 (H) 70 - 99 mg/dL    Comment: Glucose reference range applies only to samples taken after fasting for at least 8 hours.   Comment 1 Notify RN    Comment 2 Document in Chart   Glucose, capillary     Status: Abnormal   Collection Time: 09/16/24  3:12 PM  Result Value Ref Range   Glucose-Capillary 218 (H) 70 - 99 mg/dL    Comment: Glucose reference range applies only to samples taken after fasting for at least 8 hours.   Comment 1 Notify RN    Comment 2 Document in Chart   Glucose, capillary     Status: Abnormal    Collection Time: 09/16/24  8:40 PM  Result Value Ref Range   Glucose-Capillary 161 (H) 70 - 99 mg/dL    Comment: Glucose reference range applies only to samples taken after fasting for at least 8 hours.   Comment 1 Notify RN    Comment 2 Document in Chart   Glucose, capillary     Status: Abnormal   Collection Time: 09/16/24 11:52 PM  Result Value Ref Range   Glucose-Capillary 198 (H) 70 - 99 mg/dL    Comment: Glucose reference range applies only to samples taken after fasting for at least 8 hours.   Comment 1 Notify RN    Comment 2 Document in Chart   Renal function panel     Status: Abnormal   Collection Time: 09/17/24  1:30 AM  Result Value Ref Range   Sodium 124 (L) 135 - 145 mmol/L   Potassium 3.3 (L) 3.5 - 5.1 mmol/L   Chloride 93 (L) 98 - 111 mmol/L   CO2 20 (L) 22 - 32 mmol/L   Glucose, Bld 195 (H) 70 - 99 mg/dL  Comment: Glucose reference range applies only to samples taken after fasting for at least 8 hours.   BUN 15 8 - 23 mg/dL   Creatinine, Ser 9.40 (L) 0.61 - 1.24 mg/dL   Calcium 7.3 (L) 8.9 - 10.3 mg/dL   Phosphorus 1.9 (L) 2.5 - 4.6 mg/dL   Albumin 2.3 (L) 3.5 - 5.0 g/dL   GFR, Estimated >39 >39 mL/min    Comment: (NOTE) Calculated using the CKD-EPI Creatinine Equation (2021)    Anion gap 11 5 - 15    Comment: Performed at [facility], [address] [place], [place] [address]  Magnesium     Status: None   Collection Time: 09/17/24  1:30 AM  Result Value Ref Range   Magnesium 1.7 1.7 - 2.4 mg/dL    Comment: Performed at [facility], [address] [place], [place] [address]  Phosphorus     Status: Abnormal   Collection Time: 09/17/24  1:30 AM  Result Value Ref Range   Phosphorus 1.9 (L) 2.5 - 4.6 mg/dL    Comment: Performed at [facility], [address], [place], [place] [address]  Glucose, capillary     Status: Abnormal   Collection Time: 09/17/24  4:16 AM  Result Value Ref Range   Glucose-Capillary 162 (H) 70 - 99 mg/dL     Comment: Glucose reference range applies only to samples taken after fasting for at least 8 hours.   Comment 1 Notify RN    Comment 2 Document in Chart   Glucose, capillary     Status: Abnormal   Collection Time: 09/17/24  7:40 AM  Result Value Ref Range   Glucose-Capillary 163 (H) 70 - 99 mg/dL    Comment: Glucose reference range applies only to samples taken after fasting for at least 8 hours.   Comment 1 Notify RN    Comment 2 Document in Chart   Renal function panel     Status: Abnormal   Collection Time: 09/17/24 10:46 AM  Result Value Ref Range   Sodium 126 (L) 135 - 145 mmol/L   Potassium 3.6 3.5 - 5.1 mmol/L   Chloride 95 (L) 98 - 111 mmol/L   CO2 19 (L) 22 - 32 mmol/L   Glucose, Bld 155 (H) 70 - 99 mg/dL    Comment: Glucose reference range applies only to samples taken after fasting for at least 8 hours.   BUN 15 8 - 23 mg/dL   Creatinine, Ser 9.44 (L) 0.61 - 1.24 mg/dL   Calcium 7.3 (L) 8.9 - 10.3 mg/dL   Phosphorus 2.1 (L) 2.5 - 4.6 mg/dL   Albumin 2.3 (L) 3.5 - 5.0 g/dL   GFR, Estimated >39 >39 mL/min    Comment: (NOTE) Calculated using the CKD-EPI Creatinine Equation (2021)    Anion gap 12 5 - 15    Comment: Performed at [facility], [address], [place], [place] [address]  Glucose, capillary     Status: Abnormal   Collection Time: 09/17/24 11:58 AM  Result Value Ref Range   Glucose-Capillary 164 (H) 70 - 99 mg/dL    Comment: Glucose reference range applies only to samples taken after fasting for at least 8 hours.   Comment 1 Notify RN    Comment 2 Document in Chart   Glucose, capillary     Status: Abnormal   Collection Time: 09/17/24  3:06 PM  Result Value Ref Range   Glucose-Capillary 172 (H) 70 - 99  mg/dL    Comment: Glucose reference range applies only to samples taken after fasting for at least 8 hours.   Comment 1 Notify RN    Comment 2 Document in Chart   Glucose, capillary     Status: Abnormal   Collection Time: 09/17/24  8:19 PM  Result  Value Ref Range   Glucose-Capillary 141 (H) 70 - 99 mg/dL    Comment: Glucose reference range applies only to samples taken after fasting for at least 8 hours.   Comment 1 Notify RN    Comment 2 Document in Chart   Glucose, capillary     Status: Abnormal   Collection Time: 09/18/24 12:15 AM  Result Value Ref Range   Glucose-Capillary 203 (H) 70 - 99 mg/dL    Comment: Glucose reference range applies only to samples taken after fasting for at least 8 hours.   Comment 1 Notify RN    Comment 2 Document in Chart   Glucose, capillary     Status: Abnormal   Collection Time: 09/18/24  1:24 AM  Result Value Ref Range   Glucose-Capillary 164 (H) 70 - 99 mg/dL    Comment: Glucose reference range applies only to samples taken after fasting for at least 8 hours.   Comment 1 Notify RN    Comment 2 Document in Chart   Renal function panel     Status: Abnormal   Collection Time: 09/18/24  2:54 AM  Result Value Ref Range   Sodium 124 (L) 135 - 145 mmol/L   Potassium 3.2 (L) 3.5 - 5.1 mmol/L   Chloride 94 (L) 98 - 111 mmol/L   CO2 21 (L) 22 - 32 mmol/L   Glucose, Bld 164 (H) 70 - 99 mg/dL    Comment: Glucose reference range applies only to samples taken after fasting for at least 8 hours.   BUN 13 8 - 23 mg/dL   Creatinine, Ser 9.46 (L) 0.61 - 1.24 mg/dL   Calcium 7.4 (L) 8.9 - 10.3 mg/dL   Phosphorus 2.7 2.5 - 4.6 mg/dL   Albumin 2.3 (L) 3.5 - 5.0 g/dL   GFR, Estimated >39 >39 mL/min    Comment: (NOTE) Calculated using the CKD-EPI Creatinine Equation (2021)    Anion gap 9 5 - 15    Comment: Performed at [facility], [address], [place], [place] [address]  Magnesium     Status: Abnormal   Collection Time: 09/18/24  2:55 AM  Result Value Ref Range   Magnesium 1.6 (L) 1.7 - 2.4 mg/dL    Comment: Performed at [facility], [address] [place], [place] [address]  Phosphorus     Status: None   Collection Time: 09/18/24  2:55 AM  Result Value Ref Range   Phosphorus 2.6 2.5  - 4.6 mg/dL    Comment: Performed at [facility], [address], [place], [place] [address]  Glucose, capillary     Status: Abnormal   Collection Time: 09/18/24  4:29 AM  Result Value Ref Range   Glucose-Capillary 166 (H) 70 - 99 mg/dL    Comment: Glucose reference range applies only to samples taken after fasting for at least 8 hours.   Comment 1 Notify RN    Comment 2 Document in Chart   Glucose, capillary     Status: Abnormal   Collection Time: 09/18/24  6:03 AM  Result Value Ref Range   Glucose-Capillary 169 (H) 70 - 99 mg/dL  Comment: Glucose reference range applies only to samples taken after fasting for at least 8 hours.   Comment 1 Notify RN    Comment 2 Document in Chart    DG Abd 1 View Result Date: 09/17/2024 CLINICAL DATA:  Small bowel obstruction. EXAM: DG ABDOMEN 1V COMPARISON:  Lymph 725. FINDINGS: Gaseous prominence of small bowel and colon. Gas in the rectum. Feeding tube terminates in the distal stomach. IMPRESSION: Ileus. Electronically Signed   By: Newell Eke M.D.   On: 09/17/2024 15:48   US  EKG SITE RITE Result Date: 09/16/2024 If Site Rite image not attached, placement could not be confirmed due to current cardiac rhythm.     Blood pressure (!) 122/103, pulse 76, temperature 97.9 F (36.6 C), temperature source Oral, resp. rate 18, height 5' 7 (1.702 m), weight 51 kg, SpO2 96%.  Medical Problem List and Plan: 1. 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  Antithrombotics: -DVT/anticoagulation:  Pharmaceutical: Lovenox initiated 09/09/2024  -antiplatelet therapy: N/A 3. Pain Management: Tylenol  as needed 4. Mood/Behavior/Sleep: Provide emotional support  -antipsychotic agents: N/A 5. Neuropsych/cognition: This patient is not capable of making  decisions on his own behalf. 6. Skin/Wound Care: Routine skin checks 7. Fluids/Electrolytes/Nutrition: Routine in and outs with follow-up chemistries 8.  Chronic hyponatremia/SIADH.  Continue sodium chloride  tablets 2 g 3 times daily 9.  ID encephalitis/vasculitis.  ID recommending continue acyclovir x 14 days.  Patient also started on high-dose methylprednisolone by neurology 09/14/2024 with plan to continue 5-day course followed by taper of prednisone. 10.  Acute urinary retention as well as history of prostate cancer status post TURP.  Patient with retaining urine 09/13/2024 bladder scan 900 cc.  Order was given for straight cath unsuccessful coud catheter placed.  Initiated Flomax. 11.  AKI.  Resolved.  Follow-up chemistries 12.  GERD/duodenal ulceration, stricture and mass status post robotic gastrojejunostomy 8/25 at [facility].  Continue PPI 13.  Hypertension.  HCTZ at home currently on hold due to soft blood pressures.  Continue to monitor with increased mobility. 14.  Recent diagnosis of shingles September 2025.  Acyclovir as directed 15.  Hyperlipidemia.  Crestor 16.  Decreased nutritional storage/ileus.  Diet advanced to mechanical soft.   Dietary follow-up  Toribio JINNY Pitch, PA-C 09/18/2024

## 2024-09-18 NOTE — Progress Notes (Signed)
 Occupational Therapy Treatment Patient Details Name: Noah Burnett MRN: [id number] DOB: 1941-10-21 Today's Date: 09/18/2024   History of present illness 83 y.o. male presents to [facility] 09/08/24 after unwitnessed fall and AMS. Admitted with failure to thrive after multiple GI procedures. MRI brain concerning for multiple lesions with differentials that include encephalitis versus infectious etiology versus embolic infarcts. EEG showed encephalopathy. 11/5 code stroke due to L gaze preference and decreased LOA, however, no changes from prior CT head. PMHx: HTN, prediabetes, GERD, gastric outlet obstruction w/ history of bypass gastrojejunostomy   OT comments  Patient demonstrating good gains with OT treatment with min assist for bed mobility, transfers, mobility, and grooming tasks. Patient will benefit from intensive inpatient follow-up therapy, >3 hours/day.  Acute OT to continue to follow to address established goals to facilitate DC to next venue of care.        If plan is discharge home, recommend the following:  A little help with walking and/or transfers;A lot of help with bathing/dressing/bathroom;Direct supervision/assist for financial management;Assistance with cooking/housework;Direct supervision/assist for medications management;Help with stairs or ramp for entrance;Supervision due to cognitive status   Equipment Recommendations  Other (comment) (defer)    Recommendations for Other Services      Precautions / Restrictions Precautions Precautions: Fall Recall of Precautions/Restrictions: Impaired       Mobility Bed Mobility Overal bed mobility: Needs Assistance Bed Mobility: Supine to Sit     Supine to sit: Min assist, Used rails     General bed mobility comments: verbal cues to initiate    Transfers Overall transfer level: Needs assistance Equipment used: Rolling walker (2 wheels) Transfers: Sit to/from Stand Sit to Stand: Min assist           General  transfer comment: cue for hand placement and min assist for sit to stands.     Balance Overall balance assessment: Needs assistance Sitting-balance support: Feet supported Sitting balance-Leahy Scale: Fair Sitting balance - Comments: EOB   Standing balance support: Bilateral upper extremity supported, During functional activity Standing balance-Leahy Scale: Poor Standing balance comment: limited standing tolerance                           ADL either performed or assessed with clinical judgement   ADL Overall ADL's : Needs assistance/impaired Eating/Feeding: NPO   Grooming: Wash/dry hands;Wash/dry face;Supervision/safety;Sitting           Upper Body Dressing : Minimal assistance;Sitting Upper Body Dressing Details (indicate cue type and reason): gown for back     Toilet Transfer: Minimal assistance;Rolling walker (2 wheels)                  Extremity/Trunk Assessment              Vision       Perception     Praxis     Communication Communication Communication: Impaired Factors Affecting Communication: Reduced clarity of speech;Difficulty expressing self;Hearing impaired   Cognition Arousal: Alert Behavior During Therapy: Flat affect Cognition: Cognition impaired   Orientation impairments: Time Awareness: Online awareness impaired Memory impairment (select all impairments): Short-term memory Attention impairment (select first level of impairment): Sustained attention Executive functioning impairment (select all impairments): Initiation, Sequencing, Problem solving, Organization                   Following commands: Impaired Following commands impaired: Follows one step commands inconsistently      Cueing   Cueing Techniques: Verbal  cues, Tactile cues, Visual cues  Exercises      Shoulder Instructions       General Comments      Pertinent Vitals/ Pain       Pain Assessment Pain Assessment: Faces Faces Pain Scale:  Hurts a little bit Pain Location: Generalized Pain Descriptors / Indicators: Grimacing Pain Intervention(s): Monitored during session, Repositioned  Home Living                                          Prior Functioning/Environment              Frequency  Min 2X/week        Progress Toward Goals  OT Goals(current goals can now be found in the care plan section)  Progress towards OT goals: Progressing toward goals  Acute Rehab OT Goals Patient Stated Goal: unable OT Goal Formulation: Patient unable to participate in goal setting Time For Goal Achievement: 09/24/24 Potential to Achieve Goals: Good ADL Goals Pt Will Perform Upper Body Dressing: sitting;with contact guard assist Pt Will Perform Lower Body Dressing: with contact guard assist;sitting/lateral leans;sit to/from stand Pt Will Transfer to Toilet: with contact guard assist;ambulating;regular height toilet Additional ADL Goal #1: pt will follow single step command with 75% accuracy in prep for ADLs  Plan      Co-evaluation                 AM-PAC OT 6 Clicks Daily Activity     Outcome Measure   Help from another person eating meals?: A Little Help from another person taking care of personal grooming?: A Lot Help from another person toileting, which includes using toliet, bedpan, or urinal?: A Lot Help from another person bathing (including washing, rinsing, drying)?: A Lot Help from another person to put on and taking off regular upper body clothing?: A Little Help from another person to put on and taking off regular lower body clothing?: A Lot 6 Click Score: 14    End of Session Equipment Utilized During Treatment: Gait belt;Rolling walker (2 wheels)  OT Visit Diagnosis: Unsteadiness on feet (R26.81);Other abnormalities of gait and mobility (R26.89);Muscle weakness (generalized) (M62.81)   Activity Tolerance Patient tolerated treatment well   Patient Left in chair;with call  bell/phone within reach;with chair alarm set;with family/visitor present   Nurse Communication Mobility status        Time: 8841-8782 OT Time Calculation (min): 19 min  Charges: OT General Charges $OT Visit: 1 Visit OT Treatments $Self Care/Home Management : 8-22 mins  Dick Burnett, OTA Acute Rehabilitation Services  Office [phone]   Noah Burnett 09/18/2024, 1:40 PM

## 2024-09-18 NOTE — Progress Notes (Signed)
 Inpatient Rehab Admissions Coordinator:    CIR following.I have insurance auth but Pt. Now with ileus, will need to hold admission until that resolves. Pt. And family updated.   Leita Kleine, MS, CCC-SLP Rehab Admissions Coordinator  [phone] (celll) [phone] (office)

## 2024-09-18 NOTE — Progress Notes (Signed)
 [facility] NEPHROLOGY PROGRESS NOTE  Assessment/ Plan: Pt is a 83 y.o. 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  # Acute on chronic hyponatremia likely combination of SIADH in the setting of recent CNS infection.  The recent worsening hyponatremia likely also contributed by high-dose Solu-Medrol.   -The sodium level has worsened with normal saline.  Noted urine output has increased to almost 5 L in 24 hours.  I am going to lower IV fluid further down to 25 cc which is required because of ongoing treatment with IV acyclovir.  I will check urine sodium and osmolality.  Hold off diuretics because of increased urine output already.  Monitor lab.  Noted that he is also on salt tablet.  # Acute kidney injury which is resolved now.  # Hypokalemia: Likely increased urinary excretion of potassium due to polyuria.  I will replete potassium chloride and also manage hypomagnesemia.  # Varicella-zoster viral encephalitis, small vessel stroke, acute metabolic encephalopathy: Currently on high-dose steroid, acyclovir.   Subjective: Seen and examined at the bedside.  The urine output is around 4.85 L in 24 hours.  His daughter was present at the bedside.  No nausea, vomiting, chest pain or shortness of breath. Objective Vital signs in last 24 hours: Vitals:   09/18/24 0353 09/18/24 0500 09/18/24 0832 09/18/24 1206  BP: (!) 122/103  (!) 148/81 (!) 163/73  Pulse: 76  61 (!) 52  Resp: 18  17 18   Temp: 97.9 F (36.6 C)  97.6 F (36.4 C) (!) 97.5 F (36.4 C)  TempSrc: Oral  Oral Oral  SpO2: 96%  96% 100%  Weight:  51 kg    Height:       Weight change:   Intake/Output Summary (Last 24 hours) at 09/18/2024 1333 Last data filed at 09/18/2024 1041 Gross per 24  hour  Intake 825.56 ml  Output 3200 ml  Net -2374.44 ml       Labs: RENAL PANEL Recent Labs  Lab 09/13/24 0155 09/14/24 0244 09/14/24 0244 09/15/24 0141 09/16/24 0314 09/17/24 0130 09/17/24 1046 09/18/24 0254 09/18/24 0255  NA 128* 129*  --  129* 129* 124* 126* 124*  --   K 4.1 4.0  --  3.3* 3.8 3.3* 3.6 3.2*  --   CL 93* 99  --  98 99 93* 95* 94*  --   CO2 23 21*  --  19* 20* 20* 19* 21*  --   GLUCOSE 147* 134*  --  163* 209* 195* 155* 164*  --   BUN 17 24*  --  14 16 15 15 13   --   CREATININE 1.61* 2.07*  --  0.79 0.68 0.59* 0.55* 0.53*  --   CALCIUM 8.1* 7.8*  --  7.8* 7.6* 7.3* 7.3* 7.4*  --   MG 2.5* 2.2  --   --  1.7 1.7  --   --  1.6*  PHOS  --   --    < > 3.8 2.1* 1.9*  1.9* 2.1* 2.7 2.6  ALBUMIN 3.0* 2.4*  --  2.4*  --  2.3* 2.3* 2.3*  --    < > = values in this interval not displayed.    Liver Function Tests: Recent Labs  Lab 09/12/24 0158 09/13/24 0155 09/14/24 0244 09/15/24 0141 09/17/24 0130 09/17/24 1046 09/18/24 0254  AST 21 23 19   --   --   --   --   ALT 14 12 14   --   --   --   --   ALKPHOS 66 63 56  --   --   --   --   BILITOT 1.4* 1.0 1.0  --   --   --   --   PROT 5.6* 5.3* 4.6*  --   --   --   --   ALBUMIN 3.0* 3.0* 2.4*   < > 2.3* 2.3* 2.3*   < > = values in this interval not displayed.   No results for input(s): LIPASE, AMYLASE in the last 168 hours. No results for input(s): AMMONIA in the last 168 hours. CBC: Recent Labs    09/09/24 1820 09/10/24 0255 09/11/24 0258 09/12/24 0158 09/13/24 0155 09/14/24 0244 09/15/24 0141  HGB  --    < > 13.4 15.5 15.4 13.1 12.9*  MCV  --    < > 85.4 84.7 85.7 85.0 87.8  VITAMINB12 267  --   --   --   --   --   --    < > = values in this interval not displayed.    Cardiac Enzymes: No results for input(s): CKTOTAL, CKMB, CKMBINDEX, TROPONINI in the last 168 hours. CBG: Recent Labs  Lab 09/18/24 0124 09/18/24 0429 09/18/24 0603 09/18/24 0830 09/18/24 1205  GLUCAP 164*  166* 169* 142* 141*    Iron Studies: No results for input(s): IRON, TIBC, TRANSFERRIN, FERRITIN in the last 72 hours. Studies/Results: DG Abd 1 View Result Date: 09/18/2024 CLINICAL DATA:  Ileus. EXAM: ABDOMEN - 1 VIEW COMPARISON:  Abdominal radiograph dated 09/17/2024. FINDINGS: Enteric tube with tip projecting over the lower abdomen, likely in the duodenum. No bowel dilatation or evidence of obstruction. No free air or radiopaque calculi. Osteopenia with degenerative changes of spine. No acute osseous pathology. IMPRESSION: Enteric tube with tip projecting over the lower abdomen, likely in the duodenum. Electronically Signed   By: Vanetta Chou M.D.   On: 09/18/2024 11:19   DG Abd 1 View Result Date: 09/17/2024 CLINICAL DATA:  Small bowel obstruction. EXAM: DG ABDOMEN 1V COMPARISON:  Lymph 725. FINDINGS: Gaseous prominence of small bowel and colon. Gas in the rectum. Feeding tube terminates in the distal stomach. IMPRESSION: Ileus. Electronically Signed   By: Newell Eke M.D.   On: 09/17/2024 15:48    Medications: Infusions:  sodium chloride  50 mL/hr at 09/17/24 1721   acyclovir (ZOVIRAX) 455 mg in dextrose  5 % 100 mL IVPB     feeding supplement (OSMOLITE 1.5 CAL) Stopped (09/17/24 0730)   methylPREDNISolone (SOLU-MEDROL) injection 1,000 mg (09/17/24 1723)   potassium chloride 10 mEq (09/18/24 1242)    Scheduled Medications:  Chlorhexidine Gluconate Cloth  6 each Topical Daily   enoxaparin (LOVENOX) injection  30 mg Subcutaneous Q24H   feeding supplement (KATE FARMS STANDARD ENT 1.4)  325 mL Oral BID BM   feeding supplement (PROSource TF20)  60 mL Per Tube Daily   lidocaine   1 Application Urethral Once   multivitamin with minerals  1 tablet Oral Daily   pantoprazole   40 mg Oral Daily   polyethylene glycol  17 g Oral Daily   [START ON 09/19/2024] predniSONE  50 mg Oral Q breakfast   rosuvastatin  10 mg Oral Daily   senna  2 tablet Oral Daily   sodium chloride   2 g  Oral TID WC   tamsulosin  0.4  mg Oral QPC supper   thiamine  100 mg Oral Daily   vitamin B-12  500 mcg Oral Daily    have reviewed scheduled and prn medications.  Physical Exam: General: Elderly male, on chair with NG tube. Heart:RRR, s1s2 nl Lungs:clear b/l, no crackle Abdomen:soft,  non-distended, ostomy bag. Extremities:No edema Neurology: Alert awake and following command  Noah Burnett Prasad Amarise Lillo 09/18/2024,1:33 PM  LOS: 10 days

## 2024-09-19 DIAGNOSIS — B028 Zoster with other complications: Secondary | ICD-10-CM

## 2024-09-19 DIAGNOSIS — M318 Other specified necrotizing vasculopathies: Secondary | ICD-10-CM

## 2024-09-19 DIAGNOSIS — R627 Adult failure to thrive: Secondary | ICD-10-CM | POA: Diagnosis not present

## 2024-09-19 LAB — GLUCOSE, CAPILLARY
Glucose-Capillary: 152 mg/dL — ABNORMAL HIGH (ref 70–99)
Glucose-Capillary: 167 mg/dL — ABNORMAL HIGH (ref 70–99)
Glucose-Capillary: 187 mg/dL — ABNORMAL HIGH (ref 70–99)
Glucose-Capillary: 196 mg/dL — ABNORMAL HIGH (ref 70–99)
Glucose-Capillary: 207 mg/dL — ABNORMAL HIGH (ref 70–99)
Glucose-Capillary: 221 mg/dL — ABNORMAL HIGH (ref 70–99)
Glucose-Capillary: 223 mg/dL — ABNORMAL HIGH (ref 70–99)
Glucose-Capillary: 237 mg/dL — ABNORMAL HIGH (ref 70–99)

## 2024-09-19 LAB — RENAL FUNCTION PANEL
Albumin: 2.4 g/dL — ABNORMAL LOW (ref 3.5–5.0)
Anion gap: 11 (ref 5–15)
BUN: 14 mg/dL (ref 8–23)
CO2: 20 mmol/L — ABNORMAL LOW (ref 22–32)
Calcium: 7.1 mg/dL — ABNORMAL LOW (ref 8.9–10.3)
Chloride: 92 mmol/L — ABNORMAL LOW (ref 98–111)
Creatinine, Ser: 0.57 mg/dL — ABNORMAL LOW (ref 0.61–1.24)
GFR, Estimated: >60 mL/min (ref >60)
Glucose, Bld: 196 mg/dL — ABNORMAL HIGH (ref 70–99)
Phosphorus: 2 mg/dL — ABNORMAL LOW (ref 2.5–4.6)
Potassium: 3.5 mmol/L (ref 3.5–5.1)
Sodium: 123 mmol/L — ABNORMAL LOW (ref 135–145)

## 2024-09-19 LAB — SODIUM, URINE, RANDOM: Sodium, Ur: 114 mmol/L

## 2024-09-19 LAB — MAGNESIUM: Magnesium: 2 mg/dL (ref 1.7–2.4)

## 2024-09-19 MED ORDER — AMLODIPINE BESYLATE 5 MG PO TABS
5.0000 mg | ORAL_TABLET | Freq: Every day | ORAL | Status: DC
  Administered 2024-09-19 – 2024-09-21 (×2): 5 mg via ORAL
  Filled 2024-09-19 (×3): qty 1

## 2024-09-19 MED ORDER — POTASSIUM CHLORIDE 10 MEQ/100ML IV SOLN
10.0000 meq | INTRAVENOUS | Status: AC
  Administered 2024-09-19: 10 meq via INTRAVENOUS
  Filled 2024-09-19: qty 100

## 2024-09-19 MED ORDER — POTASSIUM CHLORIDE 10 MEQ/100ML IV SOLN
INTRAVENOUS | Status: AC
  Administered 2024-09-19: 10 meq via INTRAVENOUS
  Filled 2024-09-19: qty 100

## 2024-09-19 MED ORDER — FUROSEMIDE 10 MG/ML IJ SOLN
20.0000 mg | Freq: Once | INTRAMUSCULAR | Status: AC
  Administered 2024-09-19: 20 mg via INTRAVENOUS
  Filled 2024-09-19: qty 4

## 2024-09-19 NOTE — Progress Notes (Signed)
 Physical Therapy Treatment Patient Details Name: Noah Burnett MRN: [id number] DOB: [birth year] Today's Date: 09/19/2024   History of Present Illness 83 y.o. male presents to [facility] 09/08/24 after unwitnessed fall and AMS. Admitted with failure to thrive after multiple GI procedures. MRI brain concerning for multiple lesions with differentials that include encephalitis versus infectious etiology versus embolic infarcts. EEG showed encephalopathy. 11/5 code stroke due to L gaze preference and decreased LOA, however, no changes from prior CT head. PMHx: HTN, prediabetes, GERD, gastric outlet obstruction w/ history of bypass gastrojejunostomy    PT Comments  Pt received in supine and agreeable to session with daughter present and supportive throughout. Pt requires increased time for processing and increased cues due to hearing impairment. Pt able to tolerate increased gait distance with a chair follow for safety. Pt demonstrates progressively lower foot clearance, but declines need for rest break. Pt continues to benefit from PT services to progress toward functional mobility goals.    If plan is discharge home, recommend the following: A lot of help with bathing/dressing/bathroom;Assistance with cooking/housework;Direct supervision/assist for medications management;Direct supervision/assist for financial management;Assist for transportation;Help with stairs or ramp for entrance;Two people to help with walking and/or transfers   Can travel by private vehicle        Equipment Recommendations  BSC/3in1    Recommendations for Other Services       Precautions / Restrictions Precautions Precautions: Fall Recall of Precautions/Restrictions: Impaired Restrictions Weight Bearing Restrictions Per Provider Order: No     Mobility  Bed Mobility Overal bed mobility: Needs Assistance Bed Mobility: Supine to Sit     Supine to sit: Min assist, Used rails, HOB elevated     General bed mobility  comments: increased time and difficulty scooting foward to EOB    Transfers Overall transfer level: Needs assistance Equipment used: Rolling walker (2 wheels) Transfers: Sit to/from Stand Sit to Stand: Min assist           General transfer comment: From EOB with light min A. Cues for hand placement, however pt continuing to place both hands on RW    Ambulation/Gait Ambulation/Gait assistance: Min assist, Contact guard assist, +2 safety/equipment Gait Distance (Feet): 170 Feet Assistive device: Rolling walker (2 wheels) Gait Pattern/deviations: Step-through pattern, Decreased stride length, Trunk flexed Gait velocity: decreased     General Gait Details: Pt demonstrates slow gait with low foot clearance despite cues. Cues for upright posture and RW management   Stairs             Wheelchair Mobility     Tilt Bed    Modified Rankin (Stroke Patients Only) Modified Rankin (Stroke Patients Only) Pre-Morbid Rankin Score: Moderately severe disability Modified Rankin: Moderately severe disability     Balance Overall balance assessment: Needs assistance Sitting-balance support: Feet supported Sitting balance-Leahy Scale: Fair Sitting balance - Comments: EOB   Standing balance support: Bilateral upper extremity supported, During functional activity, Reliant on assistive device for balance Standing balance-Leahy Scale: Poor Standing balance comment: with RW support                            Communication Communication Communication: Impaired Factors Affecting Communication: Reduced clarity of speech;Difficulty expressing self;Hearing impaired (Hearing aid in R ear and pt deaf in L ear)  Cognition Arousal: Alert Behavior During Therapy: Flat affect   PT - Cognitive impairments: Difficult to assess Difficult to assess due to: Impaired communication  Following commands: Impaired Following commands impaired: Follows one  step commands with increased time    Cueing Cueing Techniques: Verbal cues, Tactile cues, Visual cues  Exercises      General Comments        Pertinent Vitals/Pain Pain Assessment Pain Assessment: Faces Faces Pain Scale: Hurts a little bit Pain Location: Generalized Pain Descriptors / Indicators: Grimacing Pain Intervention(s): Monitored during session, Repositioned     PT Goals (current goals can now be found in the care plan section) Acute Rehab PT Goals Patient Stated Goal: unable to state goal PT Goal Formulation: Patient unable to participate in goal setting Time For Goal Achievement: 09/24/24 Progress towards PT goals: Progressing toward goals    Frequency    Min 3X/week       AM-PAC PT 6 Clicks Mobility   Outcome Measure  Help needed turning from your back to your side while in a flat bed without using bedrails?: A Little Help needed moving from lying on your back to sitting on the side of a flat bed without using bedrails?: A Little Help needed moving to and from a bed to a chair (including a wheelchair)?: A Little Help needed standing up from a chair using your arms (e.g., wheelchair or bedside chair)?: A Little Help needed to walk in hospital room?: A Little Help needed climbing 3-5 steps with a railing? : A Lot 6 Click Score: 17    End of Session Equipment Utilized During Treatment: Gait belt Activity Tolerance: Patient tolerated treatment well Patient left: in chair;with call bell/phone within reach;with chair alarm set;with family/visitor present Nurse Communication: Mobility status PT Visit Diagnosis: Unsteadiness on feet (R26.81);Other abnormalities of gait and mobility (R26.89);Muscle weakness (generalized) (M62.81);History of falling (Z91.81)     Time: 8940-8879 PT Time Calculation (min) (ACUTE ONLY): 21 min  Charges:    $Gait Training: 8-22 mins PT General Charges $$ ACUTE PT VISIT: 1 Visit                     Darryle George,  PTA Acute Rehabilitation Services Secure Chat Preferred  [phone]    Darryle George 09/19/2024, 1:08 PM

## 2024-09-19 NOTE — Progress Notes (Signed)
 Inpatient Rehab Admissions Coordinator:    CIR following. Note Pt. Ileus resolved, tube feeds restarted. I will follow up for potential admit tomorrow or later this week depending on bed availability.  Leita Kleine, MS, CCC-SLP Rehab Admissions Coordinator  [phone] (celll) [phone] (office)

## 2024-09-19 NOTE — Inpatient Diabetes Management (Signed)
 Inpatient Diabetes Program Recommendations  AACE/ADA: New Consensus Statement on Inpatient Glycemic Control (2015)  Target Ranges:  Prepandial:   less than 140 mg/dL      Peak postprandial:   less than 180 mg/dL (1-2 hours)      Critically ill patients:  140 - 180 mg/dL   Lab Results  Component Value Date   GLUCAP 237 (H) 09/19/2024   HGBA1C 5.6 09/10/2024    Review of Glycemic Control  Latest Reference Range & Units 09/18/24 12:05 09/18/24 16:24 09/18/24 20:53 09/19/24 00:25 09/19/24 03:58 09/19/24 08:09 09/19/24 11:59 09/19/24 12:07  Glucose-Capillary 70 - 99 mg/dL 858 (H) 823 (H) 819 (H) 207 (H) 196 (H) 152 (H) 223 (H) 237 (H)  (H): Data is abnormally high  Diabetes history: No DM Outpatient Diabetes medications: None Current orders for Inpatient glycemic control: Prednisone 50 mg QAM, Osmolite @ 30/hr with a goal of 40/hr  Inpatient Diabetes Program Recommendations:    Please consider:  Novolog 0-9 units Q4H May also need some tube feed coverage as well   Thank you, Wyvonna Pinal, MSN, CDCES Diabetes Coordinator Inpatient Diabetes Program [phone] (team pager from 8a-5p)

## 2024-09-19 NOTE — TOC Progression Note (Signed)
 Transition of Care [facility]) - Progression Note    Patient Details  Name: Noah Burnett MRN: [id number] Date of Birth: [birth year]  [facility] CM/SW Contact  Landry DELENA Senters, RN Phone Number: 09/19/2024, 2:40 PM  Clinical Narrative:     Patient has one appeal for CIR, currently waiting for bed availability.  CM will continue to follow.  Expected Discharge Plan: IP Rehab Facility Barriers to Discharge: Continued Medical Work up               Expected Discharge Plan and Services   Discharge Planning Services: CM Consult Post Acute Care Choice: IP Rehab Living arrangements for the past 2 months: Single Family Home                                       Social Drivers of Health (SDOH) Interventions SDOH Screenings   Food Insecurity: No Food Insecurity (09/08/2024)  Housing: Low Risk  (09/08/2024)  Transportation Needs: No Transportation Needs (09/08/2024)  Utilities: Not At Risk (09/08/2024)  Social Connections: Unknown (09/08/2024)  Tobacco Use: Medium Risk (09/08/2024)    Readmission Risk Interventions     No data to display

## 2024-09-19 NOTE — Progress Notes (Signed)
 PROGRESS NOTE    Noah Burnett  FMW:[id number] DOB: 04/15/1941 DOA: 09/08/2024 PCP: [name], MD   Brief Narrative:  83 year old male with a history of prostate cancer (on observation), tobacco abuse, hypertension, GERD, duodenal mass resulting in gastric L obstruction status post robotic gastrojejunostomy 07/03/2024 presenting with decreased oral intake, generalized weakness, and confusion.   Patient had hospital admission at [facility] from 8/18 to 07/09/24 when he presented with nausea and vomiting.  He was found to have gastric outlet obstruction and duodenal mass. He had an EGD that showed an hiatal hernia and compression of the second portion of the duodenum with a possible intraluminal neoplasm. Biopsies were obtained which showed severely inflamed and ulcerated duodenal mucosa, but no evidence of malignancy. A second EGD was done with ultrasound guidance, but again pathology of the lesion extensive necrosis. Surgical oncology was consulted and he underwent a robotic gastrojejunostomy on 8/25. He did have a delayed return of bowel function and had a PICC line with TPN for several days until he had advancement of his diet. Since his hospitalization, he has had a fairly steady functional decline with decreasing appetite, significant weight loss, and generalized weakness.  After discharge, the patient was tolerating a diet and still driving and walking independently.   Unfortunately, the patient developed shingles in the last week of September 2025.  He went to see ophthalmology, and he was cleared from their standpoint.  He finished a course of Valtrex.  He continues to have some postherpetic neuralgia.  Daughter states that since his shingles episode, the patient has had a functional decline with decreasing appetite, significant weight loss, and generalized weakness.   The patient has followed up with surgical oncology on 08/31/2024.  At that time the patient had reported generalized  weakness and fatigue, but he did not have any vomiting or abdominal pain at that time.  He had poor p.o. intake and loss of appetite at that time.  Repeat MRI of the abdomen and pelvis performed on 09/06/2024 which showed no specific MR findings to suggest a duodenal mass.  There was Generalized edema-like signal in the retroperitoneum is presumed reactive in the setting of known duodenal ulceration.  There were multiple benign appearing hepatic lesions presumed to be cysts or hemangioma.   Daughter states that in the past week he has had increasing confusion and generalized weakness with continued decreased oral intake but no other specific symptoms. UA was negative for pyuria.  CT of the brain was negative for acute findings.  Acute abdominal series was negative for obstruction or pneumoperitoneum.   Neurology consulted for evaluation of acute encephalopathy. His hospital course complicated by acute on chronic hyponatremia. Nephrology consulted and he was started on salt tablets.  Further details as below.   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  Differential include encephalitis versus vasculitis versus demyelinating disease vs stroke. Neurology consulted and patient was transferred from [facility] to [facility]. Patient underwent fluoroscopic guided lumbar puncture and fluid sent for analysis for evaluation of meningitis and encephalitis. Procalcitonin is negative, CRP is 4.1. EEG showed diffuse  cerebral dysfunction, no epileptiform  discharges seen throughout the recording. VZVPCR is positive.  Varicella IgG negative.  Other workup negative result.  Early morning of 09/13/2024, patient noted to have left gaze preference and decreased responsiveness , code stroke was called, patient was seen by neurology.  Stat CT head was done which shows left superior convexity subarachnoid hemorrhage without mass effect.  Otherwise no changes from previous CT head.  CT angiogram ruled out large vessel occlusion.  Patient was kept n.p.o., SLP consulted.  Eventually MRI completed which showed Prominent patchy cortical and subcortical signal abnormality throughout both cerebral hemispheres, overall slightly progressed from 09/09/2024 and indeterminate although encephalitis and vasculitis remain leading considerations.  ID recommended continuing acyclovir.  Patient's mental status improved significantly on 09/14/2024.  ID and neuro both suspecting varicella encephalitis as likely etiology of patient's symptoms and recommended 14 days of total acyclovir.  Patient was also started on high-dose methylprednisolone by neurology 09/14/2024 with the plan to continue this for 5 days followed by taper of prednisone.  Currently on 50 mg prednisone daily.  Per my discussion with Dr. Michaela, he does not recommend continuing prednisone anymore.  Chronic hyponatremia: Per nephrology, possibly from SIADH. Sodium in August, 2025 was 129.  Patient was admitted with a sodium of 126 on 10/21.TSH slightly elevated. Am cortisol 30. Serum osmo is 257, urine sodium is 137, urine osmolality is 490.  And on salt tablets 1 g 3 times daily.  Nephrology signed off.  Patient's sodium dropped to 124.  On 09/17/2024, discussed with Dr. Geralynn of nephrology and since patient needs some IV fluids to prevent AKI while he is on IV acyclovir, nephrology okayed to continue at 50 cc/h and an creased salt tablets to 2 g 3 times daily.  Sodium further down to 123.  Nephrology managing.  Fluids  dropped to 30 cc/h.  Acute urinary retention: On the evening of 09/13/2024, patient was retaining urine, bladder scan showed 900 cc.  Orders were given to the nurses for straight cath however that was unsuccessful so coud catheter was placed.  Started on Flomax.  Nephrology signed off 09/15/2024.  Will need to do a voiding trial once he is more active with physical therapy at [facility].  AKI: Creatinine jumped from 0.76 to 1.66 to 2.07 09/14/2024, nephrology was on board, patient given IV fluids, AKI was presumed to be secondary to obstructive uropathy as well as contrast induced.  AKI resolved 09/15/2024 and nephrology signed off.    GERD/ Duodenal ulceration, stricture and mass s/p  robotic gastrojejunostomy on 8/25. Continue with PPI  Nausea vomiting/abdominal pain/ileus: Patient started complaining of abdominal pain and nausea and vomiting on the morning of 09/17/2024, patient had no tenderness and he was having bowel movements as well.  Abdominal x-ray showed ileus although clinically it did not appear so.  Tube feeds were stopped.  Patient looked and felt better 09/18/2024.  Repeat x-ray did not mention ileus.  Tube feeds resumed.  He is doing well without any symptoms and tolerating tube feeds.   Hypertension: On hydrochlorothiazide at home which is on hold, blood pressure controlled.   Hypokalemia: Low again, will replenish.  Hypophosphatemia: Solved.  Hypomagnesemia: Low again, will replenish.  Recently diagnosed Shingles On Cyclivert.  GOC: Patient remains full code.  On 09/13/2024, I discussed with the son, he wants the patient to be full code however he told me that the siblings are having detailed conversation about the CODE STATUS and this might change.  Palliative is also  following.   DVT prophylaxis: enoxaparin (LOVENOX) injection 30 mg Start: 09/09/24 2200   Code Status: Full Code  Family Communication: Daughter present at bedside.  Plan of care discussed with son and he was already  aware about the plan by neurology.  Status is: Inpatient Remains inpatient appropriate because: Patient medically stable, pending placement to [facility].  Estimated body mass index is 16.99 kg/m as calculated from the following:   Height as of this encounter: 5' 7 (1.702 m).   Weight as of this encounter: 49.2 kg.    Nutritional Assessment: Body mass index is 16.99 kg/m.SABRA Seen by dietician.  I agree with the assessment and plan as outlined below: Nutrition Status: Nutrition Problem: Severe Malnutrition Etiology: chronic illness Signs/Symptoms: energy intake < or equal to 75% for > or equal to 1 month, severe muscle depletion, severe fat depletion, percent weight loss Percent weight loss: 18 % (in 3 months) Interventions: MVI, Tube feeding, Prostat  . Skin Assessment: I have examined the patient's skin and I agree with the wound assessment as performed by the wound care RN as outlined below:    Consultants:  Neurology  Procedures:  As above  Antimicrobials:  Anti-infectives (From admission, onward)    Start     Dose/Rate Route Frequency Ordered Stop   09/18/24 2200  acyclovir (ZOVIRAX) 455 mg in dextrose  5 % 100 mL IVPB        455 mg 109.1 mL/hr over 60 Minutes Intravenous Every 12 hours 09/18/24 1012 09/23/24 0959   09/15/24 1000  acyclovir (ZOVIRAX) 455 mg in dextrose  5 % 100 mL IVPB  Status:  Discontinued        10 mg/kg  45.6 kg 109.1 mL/hr over 60 Minutes Intravenous Every 12 hours 09/15/24 0826 09/18/24 1012   09/13/24 2200  acyclovir (ZOVIRAX) 455 mg in dextrose  5 % 100 mL IVPB  Status:  Discontinued        10 mg/kg  45.6 kg 109.1 mL/hr over 60 Minutes Intravenous Every 24 hours 09/13/24 1923 09/15/24 0826   09/13/24 1800  valACYclovir (VALTREX) tablet 1,000 mg  Status:  Discontinued        1,000 mg Oral Daily after supper 09/13/24 0846 09/13/24 1358   09/09/24 1500  acyclovir (ZOVIRAX) 455 mg in dextrose  5 % 100 mL IVPB  Status:  Discontinued        10 mg/kg   45.6 kg 109.1 mL/hr over 60 Minutes Intravenous Every 12 hours 09/09/24 1331 09/13/24 0846         Subjective: Patient seen and examined, daughter at the bedside.  Patient more alert compared to yesterday.  He has no complaints.  No abdominal pain no nausea no vomiting and passing flatus and he also says that he had a last bowel movement yesterday morning.  Objective: Vitals:   09/19/24 0344 09/19/24 0500 09/19/24 0806 09/19/24 1157  BP: (!) 143/75  119/71 (!) 145/80  Pulse: (!) 59  (!) 50 (!) 57  Resp: 16  20 20   Temp: (!) 97.3 F (36.3 C)  97.7 F (36.5 C) 98.7 F (37.1 C)  TempSrc: Oral  Oral Oral  SpO2: 98%  94% 99%  Weight:  49.2 kg    Height:        Intake/Output Summary (Last 24 hours) at 09/19/2024 1329 Last data filed at 09/19/2024 0959 Gross per 24 hour  Intake --  Output 3500 ml  Net -3500 ml   Filed Weights   09/15/24 0500 09/18/24 0500 09/19/24 0500  Weight: 45.4 kg 51 kg 49.2 kg    Examination:  General exam: Appears calm and comfortable  Respiratory system: Clear to auscultation. Respiratory effort normal. Cardiovascular system: S1 & S2 heard, RRR. No JVD, murmurs, rubs, gallops or clicks. No pedal edema. Gastrointestinal system: Abdomen is nondistended, soft and nontender. No organomegaly or masses felt. Normal bowel sounds heard. Central nervous system: Alert and oriented.  Some weakness in right upper and lower extremity, some dysarthria.  Overall improving. Skin: No rashes, lesions or ulcers.    Data Reviewed: I have personally reviewed following labs and imaging studies  CBC: Recent Labs  Lab 09/13/24 0155 09/14/24 0244 09/15/24 0141  WBC 13.7* 11.6* 7.9  NEUTROABS 12.1* 9.8* 7.3  HGB 15.4 13.1 12.9*  HCT 43.7 36.8* 37.4*  MCV 85.7 85.0 87.8  PLT 227 198 178   Basic Metabolic Panel: Recent Labs  Lab 09/14/24 0244 09/15/24 0141 09/16/24 0314 09/17/24 0130 09/17/24 1046 09/18/24 0254 09/18/24 0255 09/19/24 0159  NA 129*   < >  129* 124* 126* 124*  --  123*  K 4.0   < > 3.8 3.3* 3.6 3.2*  --  3.5  CL 99   < > 99 93* 95* 94*  --  92*  CO2 21*   < > 20* 20* 19* 21*  --  20*  GLUCOSE 134*   < > 209* 195* 155* 164*  --  196*  BUN 24*   < > 16 15 15 13   --  14  CREATININE 2.07*   < > 0.68 0.59* 0.55* 0.53*  --  0.57*  CALCIUM 7.8*   < > 7.6* 7.3* 7.3* 7.4*  --  7.1*  MG 2.2  --  1.7 1.7  --   --  1.6* 2.0  PHOS  --    < > 2.1* 1.9*  1.9* 2.1* 2.7 2.6 2.0*   < > = values in this interval not displayed.   GFR: Estimated Creatinine Clearance: 50.4 mL/min (A) (by C-G formula based on SCr of 0.57 mg/dL (L)). Liver Function Tests: Recent Labs  Lab 09/13/24 0155 09/14/24 0244 09/15/24 0141 09/17/24 0130 09/17/24 1046 09/18/24 0254 09/19/24 0159  AST 23 19  --   --   --   --   --   ALT 12 14  --   --   --   --   --   ALKPHOS 63 56  --   --   --   --   --   BILITOT 1.0 1.0  --   --   --   --   --   PROT 5.3* 4.6*  --   --   --   --   --   ALBUMIN 3.0* 2.4* 2.4* 2.3* 2.3* 2.3* 2.4*   No results for input(s): LIPASE, AMYLASE in the last 168 hours.  No results for input(s): AMMONIA in the last 168 hours. Coagulation Profile: No results for input(s): INR, PROTIME in the last 168 hours.  Cardiac Enzymes: No results for input(s): CKTOTAL, CKMB, CKMBINDEX, TROPONINI in the last 168 hours.  BNP (last 3 results) No results for input(s): PROBNP in the last 8760 hours. HbA1C: No results for input(s): HGBA1C in the last 72 hours. CBG: Recent Labs  Lab 09/19/24 0025 09/19/24 0358 09/19/24 0809 09/19/24 1159 09/19/24 1207  GLUCAP 207* 196* 152* 223* 237*   Lipid Profile: No results for input(s): CHOL, HDL, LDLCALC, TRIG, CHOLHDL, LDLDIRECT in the last 72 hours. Thyroid Function Tests:  No results for input(s): TSH, T4TOTAL, FREET4, T3FREE, THYROIDAB in the last 72 hours. Anemia Panel: No results for input(s): VITAMINB12, FOLATE, FERRITIN, TIBC, IRON,  RETICCTPCT in the last 72 hours. Sepsis Labs: Recent Labs  Lab 09/13/24 0155 09/14/24 0244  PROCALCITON 0.10 <0.10    Recent Results (from the past 240 hours)  CSF culture w Gram Stain     Status: None   Collection Time: 09/10/24  7:10 AM   Specimen: CSF; Cerebrospinal Fluid  Result Value Ref Range Status   Specimen Description CSF  Final   Special Requests NONE  Final   Gram Stain   Final    WBC PRESENT, PREDOMINANTLY MONONUCLEAR NO ORGANISMS SEEN CYTOSPIN SMEAR    Culture   Final    NO GROWTH 3 DAYS Performed at [facility], [address] [place], [place] [address]    Report Status 09/13/2024 FINAL  Final  VZV PCR, CSF     Status: None   Collection Time: 09/11/24  8:39 AM   Specimen: Cerebrospinal Fluid  Result Value Ref Range Status   VZV PCR, CSF Negative Negative Final    Comment: (NOTE) No Varicella Zoster Virus DNA detected. Performed At: [facility] [address] [place], [place] [id number] Jennette Shorter MD Ey:[id number]   Varicella-zoster by PCR     Status: Abnormal   Collection Time: 09/11/24 11:50 AM   Specimen: Blood  Result Value Ref Range Status   Varicella-Zoster, PCR Positive (A) Negative Final    Comment: (NOTE) Varicella Zoster Virus DNA detected. This test was developed and its performance characteristics determined by [facility]. It has not been cleared or approved by the Food and Drug Administration. Performed At: [facility] [address] [place], [place] [id number] Jennette Shorter MD Ey:[id number]      Radiology Studies: DG Abd 1 View Result Date: 09/18/2024 CLINICAL DATA:  Ileus. EXAM: ABDOMEN - 1 VIEW COMPARISON:  Abdominal radiograph dated 09/17/2024. FINDINGS: Enteric tube with tip projecting over the lower abdomen, likely in the duodenum. No bowel dilatation or evidence of obstruction. No free air or radiopaque calculi. Osteopenia with degenerative changes of spine. No acute osseous pathology. IMPRESSION:  Enteric tube with tip projecting over the lower abdomen, likely in the duodenum. Electronically Signed   By: Vanetta Chou M.D.   On: 09/18/2024 11:19    Scheduled Meds:  Chlorhexidine Gluconate Cloth  6 each Topical Daily   enoxaparin (LOVENOX) injection  30 mg Subcutaneous Q24H   feeding supplement (KATE FARMS STANDARD ENT 1.4)  325 mL Oral BID BM   feeding supplement (PROSource TF20)  60 mL Per Tube Daily   lidocaine   1 Application Urethral Once   multivitamin with minerals  1 tablet Oral Daily   pantoprazole   40 mg Oral Daily   polyethylene glycol  17 g Oral Daily   predniSONE  50 mg Oral Q breakfast   rosuvastatin  10 mg Oral Daily   senna  2 tablet Oral Daily   sodium chloride   2 g Oral TID WC   tamsulosin  0.4 mg Oral QPC supper   thiamine  100 mg Oral Daily   vitamin B-12  500 mcg Oral Daily   Continuous Infusions:  sodium chloride  30 mL/hr at 09/18/24 2103   acyclovir (ZOVIRAX) 455 mg in dextrose  5 % 100 mL IVPB 455 mg (09/19/24 1148)   feeding supplement (OSMOLITE 1.5 CAL) 30 mL/hr at 09/19/24 0200     LOS: 11 days   Fredia Skeeter, MD  [facility]  09/19/2024, 1:29 PM   *Please note that this is a verbal dictation therefore any spelling or grammatical errors are due to the Dragon Medical One system interpretation.  Please page via Amion and do not message via secure chat for urgent patient care matters. Secure chat can be used for non urgent patient care matters.  How to contact the [facility] Attending or Consulting provider 7A - 7P or covering provider during after hours 7P -7A, for this patient?  Check the care team in [facility] and look for a) attending/consulting [facility] provider listed and b) the [facility] team listed. Page or secure chat 7A-7P. Log into [email or web address] and use [place] [facility] universal password to access. If you do not have the password, please contact the hospital operator. Locate the [facility] provider you are looking for under [facility] and page to a  number that you can be directly reached. If you still have difficulty reaching the provider, please page the [facility] (Director on Call) for the Hospitalists listed on amion for assistance.

## 2024-09-19 NOTE — Progress Notes (Signed)
 Speech Language Pathology Treatment: Dysphagia;Cognitive-Linguistic  Patient Details Name: Noah Burnett MRN: [id number] DOB: [birth year] Today's Date: 09/19/2024 Time: [id number] SLP Time Calculation (min) (ACUTE ONLY): 27 min  Assessment / Plan / Recommendation Clinical Impression  Pt upright in chair, lunch tray set up with daughter at bedside who reports he ate a few bites of lunch stating the beef was too salty however agreeable to another bite. Mastication prolonged and expectorated small piece with daughter stating she forgot to place dentures likely impacting mastication in addition to pt's distaste for meat. He declined to have them donned for further trials. No indications of decreased airway protection with straw sips thin. Discussed current diet and pt/dtr in agreement to continue Dys 3 for now and likely upgrade at next venue of care (inpatient rehab). His intake has been decreased, states he does not feel full but encouraged to try to increase intake. He also had hiccups and has been having for several days and appeared to be in discomfort (hx of GERD).  Initial evaluation suspected expressive language deficits with dysarthria. He exhibits spontaneous speech but sometimes needs encouragement to respond and expand thoughts. His words are accurate and pt states he does not have difficulty with finding the right word but hard to speak because it's slurred. Primary impairment appears to be significant dysarthria with decreased respiratory support resulting in intermittent hypophonia, decreased articulatory precision and at times does appear to have some difficulty initiating speech that is more likely due to breath support. Daughter states he will stop, start over and repeat with increased intensity. Encouraged pt to continue using strategies to use strategies. Cognitively his daughter stated she feels his thinking/memory is intact however noted pt required cues when asked to recall 2 speech  strategies discussed earlier at end of session, stating 1 of 2. He verbally sequenced steps to familiar activity accurately needing one min cue. Appropriate responses to 2 simple problem solving scenarios. Showing some executive functioning as daughter states pt was aware of freeze warning from watching the news and asked son to unhook pipes to prevent freeze/bursting. ST will continue while on acute and plan is inpatient rehab tomorrow or when bed becomes available per rehab coordinator.     HPI HPI: 83 yo male with history fo prostate cancer, tobacco abuse, HTN, GERD, and 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 MRI 11/1 concerning for enhancing and non-enhancing lesions with broad differential. Decreased verbalizations, increased R sided weakness, and R sided neglect observed 11/5, code stroke called. MRI 11/5 shows small volume L parietal SAH and patchy cortical and subcortical signal abnormality throughout both cerebral hemispheres, overall slightly progressed from 11/1 and indeterminate although encephalitis and vasculitis remains leading considerations.      SLP Plan  Continue with current plan of care          Recommendations  Diet recommendations: Dysphagia 3 (mechanical soft);Thin liquid Liquids provided via: Straw Medication Administration: Other (Comment) (as able to now tolerate) Supervision: Staff to assist with self feeding Compensations: Slow rate;Small sips/bites Postural Changes and/or Swallow Maneuvers: Seated upright 90 degrees                  Oral care BID   Intermittent Supervision/Assistance Dysphagia, unspecified (R13.10);Dysarthria and anarthria (R47.1);Cognitive communication deficit (R41.841)     Continue with current plan of care     Dustin Olam Bull  09/19/2024, 2:00 PM

## 2024-09-19 NOTE — Progress Notes (Signed)
 NEUROLOGY CONSULT FOLLOW UP NOTE   Date of service: September 19, 2024 Patient Name: Noah Burnett MRN:  [id number] DOB:  03-Dec-1940  Interval Hx/subjective   Family reports that he is making improvements Vitals   Vitals:   09/19/24 0344 09/19/24 0500 09/19/24 0806 09/19/24 1157  BP: (!) 143/75  119/71 (!) 145/80  Pulse: (!) 59  (!) 50 (!) 57  Resp: 16  20 20   Temp: (!) 97.3 F (36.3 C)  97.7 F (36.5 C) 98.7 F (37.1 C)  TempSrc: Oral  Oral Oral  SpO2: 98%  94% 99%  Weight:  49.2 kg    Height:         Body mass index is 16.99 kg/m.  Physical Exam   Constitutional: Appears well-developed and well-nourished.  Neurologic Examination    MS: Awake, alert, able to tell me the year but not the month.  He is dysarthric, and at times I have difficulty understanding what he is saying but he does answer questions and follow commands CN:  Motor: He has significant right hand weakness which family reports has been present for some time.  He is able to raise both arms proximally Sensory: He reports diminished sensation on the right compared to the left  Medications  Current Facility-Administered Medications:    0.9 %  sodium chloride  infusion, , Intravenous, Continuous, Dolan Mateo Larger, MD, Last Rate: 30 mL/hr at 09/18/24 2103, New Bag at 09/18/24 2103   acetaminophen  (TYLENOL ) tablet 650 mg, 650 mg, Oral, Q6H PRN, Tat, Alm, MD, 650 mg at 09/17/24 0208   acyclovir (ZOVIRAX) 455 mg in dextrose  5 % 100 mL IVPB, 455 mg, Intravenous, Q12H, Laurence Jinnie BIRCH, RPH, Last Rate: 109.1 mL/hr at 09/19/24 1148, 455 mg at 09/19/24 1148   amLODipine (NORVASC) tablet 5 mg, 5 mg, Oral, Daily, Pahwani, Fredia, MD   Chlorhexidine Gluconate Cloth 2 % PADS 6 each, 6 each, Topical, Daily, Rathore, Vasundhra, MD, 6 each at 09/19/24 1000   enoxaparin (LOVENOX) injection 30 mg, 30 mg, Subcutaneous, Q24H, Tat, Alm, MD, 30 mg at 09/18/24 2106   feeding supplement (KATE FARMS STANDARD ENT 1.4) liquid  325 mL, 325 mL, Oral, BID BM, Pahwani, Ravi, MD, 325 mL at 09/19/24 1045   feeding supplement (OSMOLITE 1.5 CAL) liquid 1,000 mL, 1,000 mL, Per Tube, Continuous, Pahwani, Ravi, MD, Last Rate: 30 mL/hr at 09/19/24 0200, Rate Change at 09/19/24 0200   feeding supplement (PROSource TF20) liquid 60 mL, 60 mL, Per Tube, Daily, Pahwani, Ravi, MD, 60 mL at 09/19/24 1043   lidocaine  (XYLOCAINE ) 2 % jelly 1 Application, 1 Application, Urethral, Once, Vernon Fredia, MD   loperamide (IMODIUM) capsule 2 mg, 2 mg, Oral, PRN, Howerter, Justin B, DO, 2 mg at 09/11/24 2328   multivitamin with minerals tablet 1 tablet, 1 tablet, Oral, Daily, Tat, David, MD, 1 tablet at 09/19/24 1044   ondansetron  (ZOFRAN ) tablet 4 mg, 4 mg, Oral, Q6H PRN **OR** ondansetron  (ZOFRAN ) injection 4 mg, 4 mg, Intravenous, Q6H PRN, Tat, David, MD, 4 mg at 09/18/24 9385   pantoprazole  (PROTONIX ) EC tablet 40 mg, 40 mg, Oral, Daily, Pahwani, Ravi, MD, 40 mg at 09/18/24 0919   polyethylene glycol (MIRALAX / GLYCOLAX) packet 17 g, 17 g, Oral, Daily, Tat, David, MD, 17 g at 09/19/24 1043   rosuvastatin (CRESTOR) tablet 10 mg, 10 mg, Oral, Daily, Arora, Ashish, MD, 10 mg at 09/19/24 1044   senna (SENOKOT) tablet 17.2 mg, 2 tablet, Oral, Daily, Tat, David, MD, 17.2 mg at  09/19/24 1044   sodium chloride  tablet 2 g, 2 g, Oral, TID WC, Geralynn Charleston, MD, 2 g at 09/19/24 0840   tamsulosin (FLOMAX) capsule 0.4 mg, 0.4 mg, Oral, QPC supper, Pahwani, Ravi, MD, 0.4 mg at 09/18/24 1746   thiamine (VITAMIN B1) tablet 100 mg, 100 mg, Oral, Daily, Pahwani, Ravi, MD, 100 mg at 09/19/24 1044   vitamin B-12 (CYANOCOBALAMIN) tablet 500 mcg, 500 mcg, Oral, Daily, Akula, Vijaya, MD, 500 mcg at 09/19/24 1043  Labs and Diagnostic Imaging    Imaging(Personally reviewed): MRI brain-multifocal patchy infarcts that are not clearly in either small vessel or embolic distribution  Assessment   Noah Burnett is a 83 y.o. male with encephalopathy and strokes in  the setting of recent varicella reactivation with positive CSF VZV IgG though negative PCR.  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  I do not think other routine small vessel risk factor modification is necessarily indicated in this scenario.  With combination of hemorrhage as well as ischemic strokes, I do not think antiplatelet therapy would be indicated either.  He has received 5 days of IV Solu-Medrol at a relatively high dose, and I do not think there is any compelling evidence to suggest longer courses of steroids are beneficial.  At this point, I would favor not continuing this.  Recommendations  Continue acyclovir per infectious disease Can discontinue steroids Neurology will be available as needed, please call with further questions or concerns. ______________________________________________________________________   Noah Aisha Seals, MD [facility]

## 2024-09-19 NOTE — Progress Notes (Signed)
 [facility] NEPHROLOGY PROGRESS NOTE  Assessment/ Plan: Pt is a 83 y.o. 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  # Acute on chronic hyponatremia likely combination of SIADH in the setting of recent CNS infection.  The recent worsening hyponatremia likely also contributed by high-dose Solu-Medrol.   -The sodium level has worsened with normal saline.  Since IV fluid is required during acyclovir treatment I reduced the dose to 30 cc an hour yesterday.  I am going to add a dose of Lasix today to augment free water excretion. Monitor lab.  No need for hypertonic saline.  Noted that he is also on salt tablet.  # Acute kidney injury which is resolved now.  # Hypokalemia: Likely increased urinary excretion of potassium due to polyuria.  I will continue to replete potassium chloride and also manage hypomagnesemia.  # Varicella-zoster viral encephalitis, small vessel stroke, acute metabolic encephalopathy: Currently on high-dose steroid, acyclovir.   Subjective: Seen and examined at the bedside.  He has increased urine output presumed due to ongoing IV fluid.  No event overnight.  Denies nausea, vomiting, chest pain or shortness of breath.  His daughter was present at the bedside. Objective Vital signs in last 24 hours: Vitals:   09/19/24 0022 09/19/24 0344 09/19/24 0500 09/19/24 0806  BP: 137/76 (!) 143/75  119/71  Pulse: 67 (!) 59  (!) 50  Resp:  16  20  Temp: (!) 97.3 F (36.3 C) (!) 97.3 F (36.3 C)  97.7 F (36.5 C)  TempSrc: Axillary Oral  Oral  SpO2: 95% 98%  94%  Weight:   49.2 kg   Height:       Weight change: -1.8 kg  Intake/Output Summary (Last 24 hours) at 09/19/2024 0915 Last data filed at 09/19/2024 0300 Gross per 24 hour  Intake  --  Output 3250 ml  Net -3250 ml       Labs: RENAL PANEL Recent Labs  Lab 09/14/24 0244 09/14/24 0244 09/15/24 0141 09/16/24 0314 09/17/24 0130 09/17/24 1046 09/18/24 0254 09/18/24 0255 09/19/24 0159  NA 129*  --  129* 129* 124* 126* 124*  --  123*  K 4.0  --  3.3* 3.8 3.3* 3.6 3.2*  --  3.5  CL 99  --  98 99 93* 95* 94*  --  92*  CO2 21*  --  19* 20* 20* 19* 21*  --  20*  GLUCOSE 134*  --  163* 209* 195* 155* 164*  --  196*  BUN 24*  --  14 16 15 15 13   --  14  CREATININE 2.07*  --  0.79 0.68 0.59* 0.55* 0.53*  --  0.57*  CALCIUM 7.8*  --  7.8* 7.6* 7.3* 7.3* 7.4*  --  7.1*  MG 2.2  --   --  1.7 1.7  --   --  1.6* 2.0  PHOS  --    < > 3.8 2.1* 1.9*  1.9* 2.1* 2.7 2.6 2.0*  ALBUMIN 2.4*  --  2.4*  --  2.3* 2.3* 2.3*  --  2.4*   < > = values in this interval not displayed.    Liver Function Tests: Recent Labs  Lab 09/13/24 0155 09/14/24 0244 09/15/24 0141 09/17/24 1046 09/18/24 0254 09/19/24 0159  AST 23 19  --   --   --   --  ALT 12 14  --   --   --   --   ALKPHOS 63 56  --   --   --   --   BILITOT 1.0 1.0  --   --   --   --   PROT 5.3* 4.6*  --   --   --   --   ALBUMIN 3.0* 2.4*   < > 2.3* 2.3* 2.4*   < > = values in this interval not displayed.   No results for input(s): LIPASE, AMYLASE in the last 168 hours. No results for input(s): AMMONIA in the last 168 hours. CBC: Recent Labs    09/09/24 1820 09/10/24 0255 09/11/24 0258 09/12/24 0158 09/13/24 0155 09/14/24 0244 09/15/24 0141  HGB  --    < > 13.4 15.5 15.4 13.1 12.9*  MCV  --    < > 85.4 84.7 85.7 85.0 87.8  VITAMINB12 267  --   --   --   --   --   --    < > = values in this interval not displayed.    Cardiac Enzymes: No results for input(s): CKTOTAL, CKMB, CKMBINDEX, TROPONINI in the last 168 hours. CBG: Recent Labs  Lab 09/18/24 1624 09/18/24 2053 09/19/24 0025 09/19/24 0358 09/19/24 0809  GLUCAP 176* 180* 207* 196* 152*    Iron Studies: No results for input(s):  IRON, TIBC, TRANSFERRIN, FERRITIN in the last 72 hours. Studies/Results: DG Abd 1 View Result Date: 09/18/2024 CLINICAL DATA:  Ileus. EXAM: ABDOMEN - 1 VIEW COMPARISON:  Abdominal radiograph dated 09/17/2024. FINDINGS: Enteric tube with tip projecting over the lower abdomen, likely in the duodenum. No bowel dilatation or evidence of obstruction. No free air or radiopaque calculi. Osteopenia with degenerative changes of spine. No acute osseous pathology. IMPRESSION: Enteric tube with tip projecting over the lower abdomen, likely in the duodenum. Electronically Signed   By: Vanetta Chou M.D.   On: 09/18/2024 11:19   DG Abd 1 View Result Date: 09/17/2024 CLINICAL DATA:  Small bowel obstruction. EXAM: DG ABDOMEN 1V COMPARISON:  Lymph 725. FINDINGS: Gaseous prominence of small bowel and colon. Gas in the rectum. Feeding tube terminates in the distal stomach. IMPRESSION: Ileus. Electronically Signed   By: Newell Eke M.D.   On: 09/17/2024 15:48    Medications: Infusions:  sodium chloride  30 mL/hr at 09/18/24 2103   acyclovir (ZOVIRAX) 455 mg in dextrose  5 % 100 mL IVPB 455 mg (09/18/24 2105)   feeding supplement (OSMOLITE 1.5 CAL) 30 mL/hr at 09/19/24 0200    Scheduled Medications:  Chlorhexidine Gluconate Cloth  6 each Topical Daily   enoxaparin (LOVENOX) injection  30 mg Subcutaneous Q24H   feeding supplement (KATE FARMS STANDARD ENT 1.4)  325 mL Oral BID BM   feeding supplement (PROSource TF20)  60 mL Per Tube Daily   lidocaine   1 Application Urethral Once   multivitamin with minerals  1 tablet Oral Daily   pantoprazole   40 mg Oral Daily   polyethylene glycol  17 g Oral Daily   predniSONE  50 mg Oral Q breakfast   rosuvastatin  10 mg Oral Daily   senna  2 tablet Oral Daily   sodium chloride   2 g Oral TID WC   tamsulosin  0.4 mg Oral QPC supper   thiamine  100 mg Oral Daily   vitamin B-12  500 mcg Oral Daily    have reviewed scheduled and prn medications.  Physical  Exam: General: Elderly male, on chair  with NG tube. Heart:RRR, s1s2 nl Lungs:clear b/l, no crackle Abdomen:soft,  non-distended, ostomy bag. Extremities:No edema Neurology: Alert awake and following command  Alethia Melendrez Prasad Ova Meegan 09/19/2024,9:15 AM  LOS: 11 days

## 2024-09-19 NOTE — Plan of Care (Signed)
 Problem: Education: Goal: Knowledge of General Education information will improve Description: Including pain rating scale, medication(s)/side effects and non-pharmacologic comfort measures Outcome: Progressing   Problem: Health Behavior/Discharge Planning: Goal: Ability to manage health-related needs will improve Outcome: Progressing   Problem: Clinical Measurements: Goal: Ability to maintain clinical measurements within normal limits will improve Outcome: Progressing Goal: Will remain free from infection Outcome: Progressing Goal: Diagnostic test results will improve Outcome: Progressing Goal: Respiratory complications will improve Outcome: Progressing Goal: Cardiovascular complication will be avoided Outcome: Progressing   Problem: Activity: Goal: Risk for activity intolerance will decrease Outcome: Progressing   Problem: Nutrition: Goal: Adequate nutrition will be maintained Outcome: Progressing   Problem: Coping: Goal: Level of anxiety will decrease Outcome: Progressing   Problem: Elimination: Goal: Will not experience complications related to bowel motility Outcome: Progressing Goal: Will not experience complications related to urinary retention Outcome: Progressing   Problem: Pain Managment: Goal: General experience of comfort will improve and/or be controlled Outcome: Progressing   Problem: Safety: Goal: Ability to remain free from injury will improve Outcome: Progressing   Problem: Skin Integrity: Goal: Risk for impaired skin integrity will decrease Outcome: Progressing   Problem: Nutrition Goal: Patient maintains adequate hydration Outcome: Progressing Goal: Patient maintains weight Outcome: Progressing Goal: Patient/Family demonstrates understanding of diet Outcome: Progressing Goal: Patient/Family independently completes tube feeding Outcome: Progressing Goal: Patient will have no more than 5 lb weight change during LOS Outcome:  Progressing Goal: Patient will utilize adaptive techniques to administer nutrition Outcome: Progressing Goal: Patient will verbalize dietary restrictions Outcome: Progressing   Problem: Education: Goal: Ability to describe self-care measures that may prevent or decrease complications (Diabetes Survival Skills Education) will improve Outcome: Progressing Goal: Individualized Educational Video(s) Outcome: Progressing   Problem: Coping: Goal: Ability to adjust to condition or change in health will improve Outcome: Progressing   Problem: Fluid Volume: Goal: Ability to maintain a balanced intake and output will improve Outcome: Progressing   Problem: Health Behavior/Discharge Planning: Goal: Ability to identify and utilize available resources and services will improve Outcome: Progressing Goal: Ability to manage health-related needs will improve Outcome: Progressing   Problem: Metabolic: Goal: Ability to maintain appropriate glucose levels will improve Outcome: Progressing   Problem: Nutritional: Goal: Maintenance of adequate nutrition will improve Outcome: Progressing Goal: Progress toward achieving an optimal weight will improve Outcome: Progressing   Problem: Skin Integrity: Goal: Risk for impaired skin integrity will decrease Outcome: Progressing   Problem: Tissue Perfusion: Goal: Adequacy of tissue perfusion will improve Outcome: Progressing   Problem: Education: Goal: Knowledge of disease or condition will improve Outcome: Progressing Goal: Knowledge of secondary prevention will improve (MUST DOCUMENT ALL) Outcome: Progressing Goal: Knowledge of patient specific risk factors will improve (DELETE if not current risk factor) Outcome: Progressing   Problem: Ischemic Stroke/TIA Tissue Perfusion: Goal: Complications of ischemic stroke/TIA will be minimized Outcome: Progressing   Problem: Coping: Goal: Will verbalize positive feelings about self Outcome:  Progressing Goal: Will identify appropriate support needs Outcome: Progressing   Problem: Health Behavior/Discharge Planning: Goal: Ability to manage health-related needs will improve Outcome: Progressing Goal: Goals will be collaboratively established with patient/family Outcome: Progressing   Problem: Self-Care: Goal: Ability to participate in self-care as condition permits will improve Outcome: Progressing Goal: Verbalization of feelings and concerns over difficulty with self-care will improve Outcome: Progressing Goal: Ability to communicate needs accurately will improve Outcome: Progressing   Problem: Nutrition: Goal: Risk of aspiration will decrease Outcome: Progressing Goal: Dietary intake will improve Outcome: Progressing  Problem: Education: Goal: Knowledge of disease or condition will improve Outcome: Progressing Goal: Knowledge of secondary prevention will improve (MUST DOCUMENT ALL) Outcome: Progressing Goal: Knowledge of patient specific risk factors will improve (DELETE if not current risk factor) Outcome: Progressing

## 2024-09-20 DIAGNOSIS — R627 Adult failure to thrive: Secondary | ICD-10-CM | POA: Diagnosis not present

## 2024-09-20 LAB — RENAL FUNCTION PANEL
Albumin: 2.4 g/dL — ABNORMAL LOW (ref 3.5–5.0)
Anion gap: 11 (ref 5–15)
BUN: 16 mg/dL (ref 8–23)
CO2: 22 mmol/L (ref 22–32)
Calcium: 7 mg/dL — ABNORMAL LOW (ref 8.9–10.3)
Chloride: 90 mmol/L — ABNORMAL LOW (ref 98–111)
Creatinine, Ser: 0.46 mg/dL — ABNORMAL LOW (ref 0.61–1.24)
GFR, Estimated: >60 mL/min (ref >60)
Glucose, Bld: 183 mg/dL — ABNORMAL HIGH (ref 70–99)
Phosphorus: <1 mg/dL — CL (ref 2.5–4.6)
Potassium: 3.2 mmol/L — ABNORMAL LOW (ref 3.5–5.1)
Sodium: 123 mmol/L — ABNORMAL LOW (ref 135–145)

## 2024-09-20 LAB — GLUCOSE, CAPILLARY
Glucose-Capillary: 188 mg/dL — ABNORMAL HIGH (ref 70–99)
Glucose-Capillary: 147 mg/dL — ABNORMAL HIGH (ref 70–99)
Glucose-Capillary: 177 mg/dL — ABNORMAL HIGH (ref 70–99)
Glucose-Capillary: 179 mg/dL — ABNORMAL HIGH (ref 70–99)
Glucose-Capillary: 147 mg/dL — ABNORMAL HIGH (ref 70–99)

## 2024-09-20 MED ORDER — UREA 15 G PO PACK
15.0000 g | PACK | Freq: Two times a day (BID) | ORAL | Status: DC
  Administered 2024-09-20 – 2024-09-21 (×3): 15 g via ORAL
  Filled 2024-09-20 (×4): qty 1

## 2024-09-20 MED ORDER — SODIUM CHLORIDE 0.9 % IV SOLN
25.0000 mg | Freq: Three times a day (TID) | INTRAVENOUS | Status: DC | PRN
  Administered 2024-09-21: 25 mg via INTRAVENOUS
  Filled 2024-09-20 (×2): qty 1

## 2024-09-20 MED ORDER — FUROSEMIDE 10 MG/ML IJ SOLN
20.0000 mg | Freq: Once | INTRAMUSCULAR | Status: AC
  Administered 2024-09-20: 20 mg via INTRAVENOUS
  Filled 2024-09-20: qty 4

## 2024-09-20 MED ORDER — POTASSIUM CHLORIDE 20 MEQ PO PACK
40.0000 meq | PACK | Freq: Once | ORAL | Status: AC
  Administered 2024-09-20: 40 meq via ORAL
  Filled 2024-09-20: qty 2

## 2024-09-20 MED ORDER — POTASSIUM & SODIUM PHOSPHATES 280-160-250 MG PO PACK
1.0000 | PACK | Freq: Three times a day (TID) | ORAL | Status: DC
  Administered 2024-09-20 – 2024-09-21 (×8): 1 via ORAL
  Filled 2024-09-20 (×13): qty 1

## 2024-09-20 MED ORDER — POTASSIUM PHOSPHATES 15 MMOLE/5ML IV SOLN
30.0000 mmol | Freq: Four times a day (QID) | INTRAVENOUS | Status: AC
  Administered 2024-09-20 (×2): 30 mmol via INTRAVENOUS
  Filled 2024-09-20 (×2): qty 10

## 2024-09-20 MED ORDER — CHLORPROMAZINE HCL 25 MG/ML IJ SOLN: 25.0000 mg | Freq: Three times a day (TID) | INTRAMUSCULAR | Status: DC | PRN

## 2024-09-20 NOTE — Progress Notes (Signed)
 Occupational Therapy Treatment Patient Details Name: Noah Burnett MRN: [id number] DOB: [birth year] Today's Date: 09/20/2024   History of present illness 83 y.o. male presents [facility] 09/08/[facility] after unwitnessed fall and AMS. Admitted with failure to thrive after multiple GI procedures. MRI brain concerning for multiple lesions with differentials that include encephalitis versus infectious etiology versus embolic infarcts. EEG showed encephalopathy. 11/5 code stroke due to L gaze preference and decreased LOA, however, no changes from prior CT head. PMHx: HTN, prediabetes, GERD, gastric outlet obstruction w/ history of bypass gastrojejunostomy   OT comments  Patient demonstrating good gains with OT treatment with sit to stands and functional transfers. Patient performed reaching tasks while standing to simulate standing ADLs with min assist for balance.  Patient will benefit from intensive inpatient follow-up therapy, >3 hours/day.  Acute OT to continue to follow to address established goals to facilitate DC to next venue of care.        If plan is discharge home, recommend the following:  A little help with walking and/or transfers;A lot of help with bathing/dressing/bathroom;Direct supervision/assist for financial management;Assistance with cooking/housework;Direct supervision/assist for medications management;Help with stairs or ramp for entrance;Supervision due to cognitive status   Equipment Recommendations  Other (comment) (defer)    Recommendations for Other Services      Precautions / Restrictions Precautions Precautions: Fall Recall of Precautions/Restrictions: Impaired Restrictions Weight Bearing Restrictions Per Provider Order: No       Mobility Bed Mobility Overal bed mobility: Needs Assistance             General bed mobility comments: OOB in recliner    Transfers Overall transfer level: Needs assistance Equipment used: Rolling walker (2 wheels) Transfers: Sit  to/from Stand Sit to Stand: Min assist           General transfer comment: cues for hand placement and min assist to power up     Balance Overall balance assessment: Needs assistance Sitting-balance support: Feet supported Sitting balance-Leahy Scale: Fair Sitting balance - Comments: in recliner without back support   Standing balance support: Single extremity supported, Bilateral upper extremity supported, No upper extremity supported, During functional activity Standing balance-Leahy Scale: Poor Standing balance comment: performd reaching tasks to simulate standing ADLs with min assist for balance                           ADL either performed or assessed with clinical judgement   ADL Overall ADL's : Needs assistance/impaired Eating/Feeding: Set up   Grooming: Sitting;Wash/dry hands;Wash/dry face;Set up                                 General ADL Comments: focused on transfers and standing balance    Extremity/Trunk Assessment              Vision       Perception     Praxis     Communication Communication Communication: Impaired Factors Affecting Communication: Reduced clarity of speech;Difficulty expressing self;Hearing impaired (Hearing aid in R ear and pt deaf in L ear)   Cognition Arousal: Alert Behavior During Therapy: Flat affect Cognition: Cognition impaired   Orientation impairments: Time Awareness: Online awareness impaired Memory impairment (select all impairments): Short-term memory Attention impairment (select first level of impairment): Sustained attention Executive functioning impairment (select all impairments): Initiation, Sequencing, Problem solving, Organization  Following commands: Impaired Following commands impaired: Follows one step commands with increased time      Cueing   Cueing Techniques: Verbal cues, Tactile cues, Visual cues  Exercises      Shoulder Instructions        General Comments      Pertinent Vitals/ Pain       Pain Assessment Pain Assessment: Faces Faces Pain Scale: Hurts a little bit Pain Location: Generalized Pain Descriptors / Indicators: Grimacing Pain Intervention(s): Monitored during session  Home Living                                          Prior Functioning/Environment              Frequency  Min 2X/week        Progress Toward Goals  OT Goals(current goals can now be found in the care plan section)  Progress towards OT goals: Progressing toward goals  Acute Rehab OT Goals Patient Stated Goal: go to rehab OT Goal Formulation: With family Time For Goal Achievement: 09/24/24 Potential to Achieve Goals: Good ADL Goals Pt Will Perform Upper Body Dressing: sitting;with contact guard assist Pt Will Perform Lower Body Dressing: with contact guard assist;sitting/lateral leans;sit to/from stand Pt Will Transfer to Toilet: with contact guard assist;ambulating;regular height toilet Additional ADL Goal #1: pt will follow single step command with 75% accuracy in prep for ADLs  Plan      Co-evaluation                 AM-PAC OT 6 Clicks Daily Activity     Outcome Measure   Help from another person eating meals?: A Little Help from another person taking care of personal grooming?: A Lot Help from another person toileting, which includes using toliet, bedpan, or urinal?: A Lot Help from another person bathing (including washing, rinsing, drying)?: A Lot Help from another person to put on and taking off regular upper body clothing?: A Little Help from another person to put on and taking off regular lower body clothing?: A Lot 6 Click Score: 14    End of Session Equipment Utilized During Treatment: Gait belt;Rolling walker (2 wheels)  OT Visit Diagnosis: Unsteadiness on feet (R26.81);Other abnormalities of gait and mobility (R26.89);Muscle weakness (generalized) (M62.81)   Activity Tolerance  Patient tolerated treatment well   Patient Left in chair;with call bell/phone within reach;with chair alarm set;with family/visitor present   Nurse Communication Mobility status        Time: 9145-9081 OT Time Calculation (min): 24 min  Charges: OT General Charges $OT Visit: 1 Visit OT Treatments $Therapeutic Activity: 23-37 mins  Dick Burnett, OTA Acute Rehabilitation Services  Office [phone]   Noah Burnett 09/20/2024, 11:08 AM

## 2024-09-20 NOTE — Progress Notes (Signed)
 Inpatient Rehab Admissions Coordinator:    CIR following. Rehab MD has concerns about sodium levels, note nephrology is working to address it. I will follow up tomorrow for potential admit if medically stable.  Leita Kleine, MS, CCC-SLP Rehab Admissions Coordinator  [phone] (celll) [phone] (office)

## 2024-09-20 NOTE — Progress Notes (Signed)
 [facility] NEPHROLOGY PROGRESS NOTE  Assessment/ Plan: Pt is a 83 y.o. 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  # Acute on chronic hyponatremia likely combination of SIADH in the setting of recent CNS infection.  The recent worsening hyponatremia likely also contributed by high-dose Solu-Medrol.   -The sodium level has worsened with normal saline.  Since IV fluid is required during acyclovir treatment I we will further reduce to 25 cc/hr. received a dose of Lasix yesterday and will order another dose today.  Continue salt tablet and I will add sodium urea  to increase solute.  Monitor lab.  # Acute kidney injury which is resolved now.  # Hypokalemia: Likely increased urinary excretion of potassium due to polyuria.  I will continue to replete potassium chloride and also manage hypomagnesemia.  # Varicella-zoster viral encephalitis, small vessel stroke, acute metabolic encephalopathy: Currently on high-dose steroid, acyclovir.  # Hypophosphatemia: Replete potassium phosphate, encourage oral intake.  Monitor lab.  Discussed with the patient's daughter at the bedside.  Also discussed with the pharmacist.  Subjective: Seen and examined at the bedside.  No event overnight.  Urine output around 4 L.  Sodium level is not much changed.  Patient daughter at the bedside. Objective Vital signs in last 24 hours: Vitals:   09/20/24 0039 09/20/24 0408 09/20/24 0500 09/20/24 0828  BP: 107/64 121/70  133/76  Pulse: 89 72  64  Resp: 15 15  18   Temp: 98.4 F (36.9 C) 98.4 F (36.9 C)  98.7 F (37.1 C)  TempSrc: Oral Oral  Oral  SpO2: 97% 98%  97%  Weight:   49.1 kg   Height:       Weight change: -0.1 kg  Intake/Output Summary (Last 24 hours) at  09/20/2024 0953 Last data filed at 09/20/2024 0616 Gross per 24 hour  Intake 3045.53 ml  Output 4000 ml  Net -954.47 ml       Labs: RENAL PANEL Recent Labs  Lab 09/14/24 0244 09/15/24 0141 09/16/24 0314 09/17/24 0130 09/17/24 1046 09/18/24 0254 09/18/24 0255 09/19/24 0159 09/20/24 0140  NA 129*   < > 129* 124* 126* 124*  --  123* 123*  K 4.0   < > 3.8 3.3* 3.6 3.2*  --  3.5 3.2*  CL 99   < > 99 93* 95* 94*  --  92* 90*  CO2 21*   < > 20* 20* 19* 21*  --  20* 22  GLUCOSE 134*   < > 209* 195* 155* 164*  --  196* 183*  BUN 24*   < > 16 15 15 13   --  14 16  CREATININE 2.07*   < > 0.68 0.59* 0.55* 0.53*  --  0.57* 0.46*  CALCIUM 7.8*   < > 7.6* 7.3* 7.3* 7.4*  --  7.1* 7.0*  MG 2.2  --  1.7 1.7  --   --  1.6* 2.0  --   PHOS  --    < > 2.1* 1.9*  1.9* 2.1* 2.7 2.6 2.0* <1.0*  ALBUMIN 2.4*   < >  --  2.3* 2.3* 2.3*  --  2.4* 2.4*   < > = values in this interval not displayed.    Liver Function Tests: Recent Labs  Lab 09/14/24 0244 09/15/24 0141 09/18/24 0254 09/19/24 0159 09/20/24 0140  AST 19  --   --   --   --   ALT 14  --   --   --   --   ALKPHOS 56  --   --   --   --   BILITOT 1.0  --   --   --   --   PROT 4.6*  --   --   --   --   ALBUMIN 2.4*   < > 2.3* 2.4* 2.4*   < > = values in this interval not displayed.   No results for input(s): LIPASE, AMYLASE in the last 168 hours. No results for input(s): AMMONIA in the last 168 hours. CBC: Recent Labs    09/09/24 1820 09/10/24 0255 09/11/24 0258 09/12/24 0158 09/13/24 0155 09/14/24 0244 09/15/24 0141  HGB  --    < > 13.4 15.5 15.4 13.1 12.9*  MCV  --    < > 85.4 84.7 85.7 85.0 87.8  VITAMINB12 267  --   --   --   --   --   --    < > = values in this interval not displayed.    Cardiac Enzymes: No results for input(s): CKTOTAL, CKMB, CKMBINDEX, TROPONINI in the last 168 hours. CBG: Recent Labs  Lab 09/19/24 1611 09/19/24 2053 09/19/24 2354 09/20/24 0400 09/20/24 0738  GLUCAP 221*  167* 187* 188* 147*    Iron Studies: No results for input(s): IRON, TIBC, TRANSFERRIN, FERRITIN in the last 72 hours. Studies/Results: DG Abd 1 View Result Date: 09/18/2024 CLINICAL DATA:  Ileus. EXAM: ABDOMEN - 1 VIEW COMPARISON:  Abdominal radiograph dated 09/17/2024. FINDINGS: Enteric tube with tip projecting over the lower abdomen, likely in the duodenum. No bowel dilatation or evidence of obstruction. No free air or radiopaque calculi. Osteopenia with degenerative changes of spine. No acute osseous pathology. IMPRESSION: Enteric tube with tip projecting over the lower abdomen, likely in the duodenum. Electronically Signed   By: Vanetta Chou M.D.   On: 09/18/2024 11:19    Medications: Infusions:  sodium chloride  25 mL/hr at 09/20/24 9076   acyclovir (ZOVIRAX) 455 mg in dextrose  5 % 100 mL IVPB 455 mg (09/20/24 0930)   feeding supplement (OSMOLITE 1.5 CAL) 1,000 mL (09/20/24 0931)   potassium PHOSPHATE IVPB (in mmol)      Scheduled Medications:  amLODipine  5 mg Oral Daily   Chlorhexidine Gluconate Cloth  6 each Topical Daily   enoxaparin (LOVENOX) injection  30 mg Subcutaneous Q24H   feeding supplement (KATE FARMS STANDARD ENT 1.4)  325 mL Oral BID BM   feeding supplement (PROSource TF20)  60 mL Per Tube Daily   lidocaine   1 Application Urethral Once   multivitamin with minerals  1 tablet Oral Daily   pantoprazole   40 mg Oral Daily   polyethylene glycol  17 g Oral Daily   potassium & sodium phosphates  1 packet Oral TID AC & HS   rosuvastatin  10 mg Oral Daily   senna  2 tablet Oral Daily   sodium chloride   2 g Oral TID WC   tamsulosin  0.4 mg Oral QPC supper   thiamine  100 mg Oral Daily   urea   15 g Oral BID   vitamin B-12  500 mcg Oral Daily    have reviewed scheduled and prn medications.  Physical Exam: General: Elderly male, frail, on chair with NG tube. Heart:RRR, s1s2 nl Lungs:clear b/l, no crackle Abdomen:soft,  non-distended, ostomy  bag. Extremities:No edema Neurology:  Alert awake and following command  Abi Shoults Prasad Darianny Momon 09/20/2024,9:53 AM  LOS: 12 days

## 2024-09-20 NOTE — Progress Notes (Signed)
 PROGRESS NOTE    Noah Burnett  FMW:[id number] DOB: 05/24/1941 DOA: 09/08/2024 PCP: [name], MD   Brief Narrative:  83 year old male with a history of prostate cancer (on observation), tobacco abuse, hypertension, GERD, duodenal mass resulting in gastric L obstruction status post robotic gastrojejunostomy 07/03/2024 presenting with decreased oral intake, generalized weakness, and confusion.   Patient had hospital admission at [facility] from 8/18 to 07/09/24 when he presented with nausea and vomiting.  He was found to have gastric outlet obstruction and duodenal mass. He had an EGD that showed an hiatal hernia and compression of the second portion of the duodenum with a possible intraluminal neoplasm. Biopsies were obtained which showed severely inflamed and ulcerated duodenal mucosa, but no evidence of malignancy. A second EGD was done with ultrasound guidance, but again pathology of the lesion extensive necrosis. Surgical oncology was consulted and he underwent a robotic gastrojejunostomy on 8/25. He did have a delayed return of bowel function and had a PICC line with TPN for several days until he had advancement of his diet. Since his hospitalization, he has had a fairly steady functional decline with decreasing appetite, significant weight loss, and generalized weakness.  After discharge, the patient was tolerating a diet and still driving and walking independently.   Unfortunately, the patient developed shingles in the last week of September 2025.  He went to see ophthalmology, and he was cleared from their standpoint.  He finished a course of Valtrex.  He continues to have some postherpetic neuralgia.  Daughter states that since his shingles episode, the patient has had a functional decline with decreasing appetite, significant weight loss, and generalized weakness.   The patient has followed up with surgical oncology on 08/31/2024.  At that time the patient had reported generalized  weakness and fatigue, but he did not have any vomiting or abdominal pain at that time.  He had poor p.o. intake and loss of appetite at that time.  Repeat MRI of the abdomen and pelvis performed on 09/06/2024 which showed no specific MR findings to suggest a duodenal mass.  There was Generalized edema-like signal in the retroperitoneum is presumed reactive in the setting of known duodenal ulceration.  There were multiple benign appearing hepatic lesions presumed to be cysts or hemangioma.   Daughter states that in the past week he has had increasing confusion and generalized weakness with continued decreased oral intake but no other specific symptoms. UA was negative for pyuria.  CT of the brain was negative for acute findings.  Acute abdominal series was negative for obstruction or pneumoperitoneum.   Neurology consulted for evaluation of acute encephalopathy. His hospital course complicated by acute on chronic hyponatremia. Nephrology consulted and he was started on salt tablets.  Further details as below.   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  Differential include encephalitis versus vasculitis versus demyelinating disease vs stroke. Neurology consulted and patient was transferred from [facility] to [facility]. Patient underwent fluoroscopic guided lumbar puncture and fluid sent for analysis for evaluation of meningitis and encephalitis. Procalcitonin is negative, CRP is 4.1. EEG showed diffuse  cerebral dysfunction, no epileptiform  discharges seen throughout the recording. VZVPCR is positive.  Varicella IgG negative.  Other workup negative result.  Early morning of 09/13/2024, patient noted to have left gaze preference and decreased responsiveness , code stroke was called, patient was seen by neurology.  Stat CT head was done which shows left superior convexity subarachnoid hemorrhage without mass effect.  Otherwise no changes from previous CT head.  CT angiogram ruled out large vessel occlusion.  Patient was kept n.p.o., SLP consulted.  Eventually MRI completed which showed Prominent patchy cortical and subcortical signal abnormality throughout both cerebral hemispheres, overall slightly progressed from 09/09/2024 and indeterminate although encephalitis and vasculitis remain leading considerations.  ID recommended continuing acyclovir.  Patient's mental status improved significantly on 09/14/2024.  ID and neuro both suspecting varicella encephalitis as likely etiology of patient's symptoms and recommended 14 days of total acyclovir.  Patient was also started on high-dose methylprednisolone by neurology 09/14/2024 with the plan to continue this for 5 days followed by taper of prednisone.  Currently on 50 mg prednisone daily.  Per my discussion with Dr. Michaela, he does not recommend continuing prednisone anymore.  Chronic hyponatremia: Per nephrology, possibly from SIADH. Sodium in August, 2025 was 129.  Patient was admitted with a sodium of 126 on 10/21.TSH slightly elevated. Am cortisol 30. Serum osmo is 257, urine sodium is 137, urine osmolality is 490.  And on salt tablets 1 g 3 times daily.  Nephrology signed off.  Patient's sodium dropped to 124.  On 09/17/2024, discussed with Dr. Geralynn of nephrology and since patient needs some IV fluids to prevent AKI while he is on IV acyclovir, nephrology okayed to continue at 50 cc/h and an creased salt tablets to 2 g 3 times daily.  Sodium 123 again today, stable from yesterday.  Nephrology  managing, we appreciate their help.  She is getting intermittent Lasix.  Acute urinary retention: On the evening of 09/13/2024, patient was retaining urine, bladder scan showed 900 cc.  Orders were given to the nurses for straight cath however that was unsuccessful so coud catheter was placed.  Started on Flomax.  Nephrology signed off 09/15/2024.  Will need to do a voiding trial once he is more active with physical therapy at [facility].  AKI: Creatinine jumped from 0.76 to 1.66 to 2.07 09/14/2024, nephrology was on board, patient given IV fluids, AKI was presumed to be secondary to obstructive uropathy as well as contrast induced.  AKI resolved 09/15/2024.   GERD/ Duodenal ulceration, stricture and mass s/p  robotic gastrojejunostomy on 8/25. Continue with PPI  Nausea vomiting/abdominal pain/ileus: Patient started complaining of abdominal pain and nausea and vomiting on the morning of 09/17/2024, patient had no tenderness and he was having bowel movements as well.  Abdominal x-ray showed ileus although clinically it did not appear so.  Tube feeds were stopped.  Patient looked and felt better 09/18/2024.  Repeat x-ray did not mention ileus.  Tube feeds resumed.  He is doing well without any symptoms and tolerating tube feeds.   Hypertension: On hydrochlorothiazide at home which is on hold, blood pressure controlled.   Hypokalemia: Low again will replenish.  Hypophosphatemia: Very low, will replenish.  Hypomagnesemia: Resolved.  Recently diagnosed Shingles On Cyclivert.  GOC: Patient remains full code.  On 09/13/2024, I discussed with the son, he wants the patient to be full code however he told me that the siblings are having detailed conversation about the CODE STATUS and this might change.  Palliative is  also following.   DVT prophylaxis: enoxaparin (LOVENOX) injection 30 mg Start: 09/09/24 2200   Code Status: Full Code  Family Communication: Daughter present at bedside.  Plan of care discussed with  son and he was already aware about the plan by neurology.  Status is: Inpatient Remains inpatient appropriate because: Patient medically stable, pending placement to [facility].  Estimated body mass index is 16.95 kg/m as calculated from the following:   Height as of this encounter: 5' 7 (1.702 m).   Weight as of this encounter: 49.1 kg.    Nutritional Assessment: Body mass index is 16.95 kg/m.SABRA Seen by dietician.  I agree with the assessment and plan as outlined below: Nutrition Status: Nutrition Problem: Severe Malnutrition Etiology: chronic illness Signs/Symptoms: energy intake < or equal to 75% for > or equal to 1 month, severe muscle depletion, severe fat depletion, percent weight loss Percent weight loss: 18 % (in 3 months) Interventions: MVI, Tube feeding, Prostat  . Skin Assessment: I have examined the patient's skin and I agree with the wound assessment as performed by the wound care RN as outlined below:    Consultants:  Neurology  Procedures:  As above  Antimicrobials:  Anti-infectives (From admission, onward)    Start     Dose/Rate Route Frequency Ordered Stop   09/18/24 2200  acyclovir (ZOVIRAX) 455 mg in dextrose  5 % 100 mL IVPB        455 mg 109.1 mL/hr over 60 Minutes Intravenous Every 12 hours 09/18/24 1012 09/23/24 0959   09/15/24 1000  acyclovir (ZOVIRAX) 455 mg in dextrose  5 % 100 mL IVPB  Status:  Discontinued        10 mg/kg  45.6 kg 109.1 mL/hr over 60 Minutes Intravenous Every 12 hours 09/15/24 0826 09/18/24 1012   09/13/24 2200  acyclovir (ZOVIRAX) 455 mg in dextrose  5 % 100 mL IVPB  Status:  Discontinued        10 mg/kg  45.6 kg 109.1 mL/hr over 60 Minutes Intravenous Every 24 hours 09/13/24 1923 09/15/24 0826   09/13/24 1800  valACYclovir (VALTREX) tablet 1,000 mg  Status:  Discontinued        1,000 mg Oral Daily after supper 09/13/24 0846 09/13/24 1358   09/09/24 1500  acyclovir (ZOVIRAX) 455 mg in dextrose  5 % 100 mL IVPB  Status:  Discontinued         10 mg/kg  45.6 kg 109.1 mL/hr over 60 Minutes Intravenous Every 12 hours 09/09/24 1331 09/13/24 0846         Subjective: Patient seen and examined, nurse at daughter at the bedside.  Patient has no complaints, he has been stable for last few days.  Received a message from [facility] coordinator that his sodium of 123 is too low for them, even though patient is asymptomatic.  They have not offered a bed today.  They will consider for tomorrow.  Objective: Vitals:   09/20/24 0039 09/20/24 0408 09/20/24 0500 09/20/24 0828  BP: 107/64 121/70  133/76  Pulse: 89 72  64  Resp: 15 15  18   Temp: 98.4 F (36.9 C) 98.4 F (36.9 C)  98.7 F (37.1 C)  TempSrc: Oral Oral  Oral  SpO2: 97% 98%  97%  Weight:   49.1 kg   Height:        Intake/Output Summary (Last 24 hours) at 09/20/2024 1059 Last data filed at 09/20/2024 0616 Gross per 24 hour  Intake 3045.53 ml  Output 3350 ml  Net -304.47 ml  Filed Weights   09/18/24 0500 09/19/24 0500 09/20/24 0500  Weight: 51 kg 49.2 kg 49.1 kg    Examination:  General exam: Appears calm and comfortable  Respiratory system: Clear to auscultation. Respiratory effort normal. Cardiovascular system: S1 & S2 heard, RRR. No JVD, murmurs, rubs, gallops or clicks. No pedal edema. Gastrointestinal system: Abdomen is nondistended, soft and nontender. No organomegaly or masses felt. Normal bowel sounds heard. Central nervous system: Alert and oriented.  Some weakness in right upper and lower extremity, some dysarthria, slowly improving. Extremities: Symmetric 5 x 5 power. Skin: No rashes, lesions or ulcers.   Data Reviewed: I have personally reviewed following labs and imaging studies  CBC: Recent Labs  Lab 09/14/24 0244 09/15/24 0141  WBC 11.6* 7.9  NEUTROABS 9.8* 7.3  HGB 13.1 12.9*  HCT 36.8* 37.4*  MCV 85.0 87.8  PLT 198 178   Basic Metabolic Panel: Recent Labs  Lab 09/14/24 0244 09/15/24 0141 09/16/24 0314 09/17/24 0130  09/17/24 1046 09/18/24 0254 09/18/24 0255 09/19/24 0159 09/20/24 0140  NA 129*   < > 129* 124* 126* 124*  --  123* 123*  K 4.0   < > 3.8 3.3* 3.6 3.2*  --  3.5 3.2*  CL 99   < > 99 93* 95* 94*  --  92* 90*  CO2 21*   < > 20* 20* 19* 21*  --  20* 22  GLUCOSE 134*   < > 209* 195* 155* 164*  --  196* 183*  BUN 24*   < > 16 15 15 13   --  14 16  CREATININE 2.07*   < > 0.68 0.59* 0.55* 0.53*  --  0.57* 0.46*  CALCIUM 7.8*   < > 7.6* 7.3* 7.3* 7.4*  --  7.1* 7.0*  MG 2.2  --  1.7 1.7  --   --  1.6* 2.0  --   PHOS  --    < > 2.1* 1.9*  1.9* 2.1* 2.7 2.6 2.0* <1.0*   < > = values in this interval not displayed.   GFR: Estimated Creatinine Clearance: 50.3 mL/min (A) (by C-G formula based on SCr of 0.46 mg/dL (L)). Liver Function Tests: Recent Labs  Lab 09/14/24 0244 09/15/24 0141 09/17/24 0130 09/17/24 1046 09/18/24 0254 09/19/24 0159 09/20/24 0140  AST 19  --   --   --   --   --   --   ALT 14  --   --   --   --   --   --   ALKPHOS 56  --   --   --   --   --   --   BILITOT 1.0  --   --   --   --   --   --   PROT 4.6*  --   --   --   --   --   --   ALBUMIN 2.4*   < > 2.3* 2.3* 2.3* 2.4* 2.4*   < > = values in this interval not displayed.   No results for input(s): LIPASE, AMYLASE in the last 168 hours.  No results for input(s): AMMONIA in the last 168 hours. Coagulation Profile: No results for input(s): INR, PROTIME in the last 168 hours.  Cardiac Enzymes: No results for input(s): CKTOTAL, CKMB, CKMBINDEX, TROPONINI in the last 168 hours.  BNP (last 3 results) No results for input(s): PROBNP in the last 8760 hours. HbA1C: No results for input(s): HGBA1C in the last 72  hours. CBG: Recent Labs  Lab 09/19/24 1611 09/19/24 2053 09/19/24 2354 09/20/24 0400 09/20/24 0738  GLUCAP 221* 167* 187* 188* 147*   Lipid Profile: No results for input(s): CHOL, HDL, LDLCALC, TRIG, CHOLHDL, LDLDIRECT in the last 72 hours. Thyroid Function  Tests: No results for input(s): TSH, T4TOTAL, FREET4, T3FREE, THYROIDAB in the last 72 hours. Anemia Panel: No results for input(s): VITAMINB12, FOLATE, FERRITIN, TIBC, IRON, RETICCTPCT in the last 72 hours. Sepsis Labs: Recent Labs  Lab 09/14/24 0244  PROCALCITON <0.10    Recent Results (from the past 240 hours)  VZV PCR, CSF     Status: None   Collection Time: 09/11/24  8:39 AM   Specimen: Cerebrospinal Fluid  Result Value Ref Range Status   VZV PCR, CSF Negative Negative Final    Comment: (NOTE) No Varicella Zoster Virus DNA detected. Performed At: [facility] [address] [place], [place] [id number] Jennette Shorter MD Ey:[id number]   Varicella-zoster by PCR     Status: Abnormal   Collection Time: 09/11/24 11:50 AM   Specimen: Blood  Result Value Ref Range Status   Varicella-Zoster, PCR Positive (A) Negative Final    Comment: (NOTE) Varicella Zoster Virus DNA detected. This test was developed and its performance characteristics determined by [facility]. It has not been cleared or approved by the Food and Drug Administration. Performed At: [facility] [address] [place], [place] [id number] Jennette Shorter MD Ey:[id number]      Radiology Studies: No results found.   Scheduled Meds:  amLODipine  5 mg Oral Daily   Chlorhexidine Gluconate Cloth  6 each Topical Daily   enoxaparin (LOVENOX) injection  30 mg Subcutaneous Q24H   feeding supplement (KATE FARMS STANDARD ENT 1.4)  325 mL Oral BID BM   feeding supplement (PROSource TF20)  60 mL Per Tube Daily   lidocaine   1 Application Urethral Once   multivitamin with minerals  1 tablet Oral Daily   pantoprazole   40 mg Oral Daily   polyethylene glycol  17 g Oral Daily   potassium & sodium phosphates  1 packet Oral TID AC & HS   rosuvastatin  10 mg Oral Daily   senna  2 tablet Oral Daily   sodium chloride   2 g Oral TID WC   tamsulosin  0.4 mg Oral QPC supper   thiamine  100 mg  Oral Daily   urea   15 g Oral BID   vitamin B-12  500 mcg Oral Daily   Continuous Infusions:  sodium chloride  25 mL/hr at 09/20/24 0923   acyclovir (ZOVIRAX) 455 mg in dextrose  5 % 100 mL IVPB 455 mg (09/20/24 0930)   feeding supplement (OSMOLITE 1.5 CAL) 1,000 mL (09/20/24 0931)   potassium PHOSPHATE IVPB (in mmol)       LOS: 12 days   Fredia Skeeter, MD [facility]  09/20/2024, 10:59 AM   *Please note that this is a verbal dictation therefore any spelling or grammatical errors are due to the Dragon [employer] One system interpretation.  Please page via Amion and do not message via secure chat for urgent patient care matters. Secure chat can be used for non urgent patient care matters.  How to contact the [facility] Attending or Consulting provider 7A - 7P or covering provider during after hours 7P -7A, for this patient?  Check the care team in [facility] and look for a) attending/consulting [facility] provider listed and b) the [facility] team listed. Page or secure chat 7A-7P. Log into [email or web address] and use [facility]'s universal password to access. If you do not have the password, please contact the hospital operator. Locate the [facility] provider you are looking for under [facility] and page to a number that you can be directly reached. If you still have difficulty reaching the provider, please page the [facility] (Director on Call) for the Hospitalists listed on amion for assistance.

## 2024-09-20 NOTE — Progress Notes (Signed)
   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 Palliative care has been asked to support goals of care conversations.   Today's Discussion 09/20/2024  *Please note that this is a verbal dictation therefore any spelling or grammatical errors are due to the Dragon Medical One system interpretation.  Chart reviewed inclusive of vital signs, progress notes of Dr. Vernon, Dr. Luiz - ID, Macario Soja - PT, Chyrl Conger - RN, laboratory results Na 123, K 3.2, CHL 90, CO2 22, Glu 183, BUN 16, Cr 0.46, Ca 7.0, Anion Gap 11, GFR > 60, and diagnostic images.  I met with Noah Burnett at bedside he had just walked the duration of the until with the occupational therapist. He was awake and in the process of continuing therapeutic movement. He was not distressed.  I spoke with patients daughter, Noah Burnett. She shares the plan for transfer to CIR today.   Questions and concerns addressed/Palliative Support Provided.   Objective Assessment: Vital Signs Vitals:   09/20/24 0408 09/20/24 0828  BP: 121/70 133/76  Pulse: 72 64  Resp: 15 18  Temp: 98.4 F (36.9 C) 98.7 F (37.1 C)  SpO2: 98% 97%    Intake/Output Summary (Last 24 hours) at 09/20/2024 0945 Last data filed at 09/20/2024 9383 Gross per 24 hour  Intake 3045.53 ml  Output 4000 ml  Net -954.47 ml   Last Weight  Most recent update: 09/20/2024  6:54 AM    Weight  49.1 kg (108 lb 3.9 oz)            Gen: Elderly Caucasian male chronically ill in appearance HEENT: Coretrack in place, Dry mucous membranes CV: Regular rate and rhythm PULM: On room air breathing is even and nonlabored ABD: soft/nontender EXT: No edema Neuro: Aware of self and that we are in  [place]  SUMMARY OF RECOMMENDATIONS   Full code/full scope of care   Allowing time for outcomes   Emotional support provided  Plan for CIR today ______________________________________________________________________________________ Rosaline Becton [place] Palliative Medicine Team Team Cell Phone: [phone] Please utilize secure chat with additional questions, if there is no response within 30 minutes please call the above phone number  Billing based on MDM:Low  Palliative Medicine Team providers are available by phone from 7am to 7pm daily and can be reached through the team cell phone.  Should this patient require assistance outside of these hours, please call the patient's attending physician.

## 2024-09-20 NOTE — Plan of Care (Signed)
 Problem: Education: Goal: Knowledge of General Education information will improve Description: Including pain rating scale, medication(s)/side effects and non-pharmacologic comfort measures Outcome: Progressing   Problem: Health Behavior/Discharge Planning: Goal: Ability to manage health-related needs will improve Outcome: Progressing   Problem: Clinical Measurements: Goal: Ability to maintain clinical measurements within normal limits will improve Outcome: Progressing Goal: Will remain free from infection Outcome: Progressing Goal: Diagnostic test results will improve Outcome: Progressing Goal: Respiratory complications will improve Outcome: Progressing Goal: Cardiovascular complication will be avoided Outcome: Progressing   Problem: Activity: Goal: Risk for activity intolerance will decrease Outcome: Progressing   Problem: Nutrition: Goal: Adequate nutrition will be maintained Outcome: Progressing   Problem: Coping: Goal: Level of anxiety will decrease Outcome: Progressing   Problem: Elimination: Goal: Will not experience complications related to bowel motility Outcome: Progressing Goal: Will not experience complications related to urinary retention Outcome: Progressing   Problem: Pain Managment: Goal: General experience of comfort will improve and/or be controlled Outcome: Progressing   Problem: Safety: Goal: Ability to remain free from injury will improve Outcome: Progressing   Problem: Skin Integrity: Goal: Risk for impaired skin integrity will decrease Outcome: Progressing   Problem: Nutrition Goal: Patient maintains adequate hydration Outcome: Progressing Goal: Patient maintains weight Outcome: Progressing Goal: Patient/Family demonstrates understanding of diet Outcome: Progressing Goal: Patient/Family independently completes tube feeding Outcome: Progressing Goal: Patient will have no more than 5 lb weight change during LOS Outcome:  Progressing Goal: Patient will utilize adaptive techniques to administer nutrition Outcome: Progressing Goal: Patient will verbalize dietary restrictions Outcome: Progressing   Problem: Education: Goal: Ability to describe self-care measures that may prevent or decrease complications (Diabetes Survival Skills Education) will improve Outcome: Progressing Goal: Individualized Educational Video(s) Outcome: Progressing   Problem: Coping: Goal: Ability to adjust to condition or change in health will improve Outcome: Progressing   Problem: Fluid Volume: Goal: Ability to maintain a balanced intake and output will improve Outcome: Progressing   Problem: Health Behavior/Discharge Planning: Goal: Ability to identify and utilize available resources and services will improve Outcome: Progressing Goal: Ability to manage health-related needs will improve Outcome: Progressing   Problem: Metabolic: Goal: Ability to maintain appropriate glucose levels will improve Outcome: Progressing   Problem: Nutritional: Goal: Maintenance of adequate nutrition will improve Outcome: Progressing Goal: Progress toward achieving an optimal weight will improve Outcome: Progressing   Problem: Skin Integrity: Goal: Risk for impaired skin integrity will decrease Outcome: Progressing   Problem: Tissue Perfusion: Goal: Adequacy of tissue perfusion will improve Outcome: Progressing   Problem: Education: Goal: Knowledge of disease or condition will improve Outcome: Progressing Goal: Knowledge of secondary prevention will improve (MUST DOCUMENT ALL) Outcome: Progressing Goal: Knowledge of patient specific risk factors will improve (DELETE if not current risk factor) Outcome: Progressing   Problem: Ischemic Stroke/TIA Tissue Perfusion: Goal: Complications of ischemic stroke/TIA will be minimized Outcome: Progressing   Problem: Coping: Goal: Will verbalize positive feelings about self Outcome:  Progressing Goal: Will identify appropriate support needs Outcome: Progressing   Problem: Health Behavior/Discharge Planning: Goal: Ability to manage health-related needs will improve Outcome: Progressing Goal: Goals will be collaboratively established with patient/family Outcome: Progressing   Problem: Self-Care: Goal: Ability to participate in self-care as condition permits will improve Outcome: Progressing Goal: Verbalization of feelings and concerns over difficulty with self-care will improve Outcome: Progressing Goal: Ability to communicate needs accurately will improve Outcome: Progressing   Problem: Nutrition: Goal: Risk of aspiration will decrease Outcome: Progressing Goal: Dietary intake will improve Outcome: Progressing  Problem: Education: Goal: Knowledge of disease or condition will improve Outcome: Progressing Goal: Knowledge of secondary prevention will improve (MUST DOCUMENT ALL) Outcome: Progressing Goal: Knowledge of patient specific risk factors will improve (DELETE if not current risk factor) Outcome: Progressing

## 2024-09-21 ENCOUNTER — Inpatient Hospital Stay (HOSPITAL_COMMUNITY)

## 2024-09-21 ENCOUNTER — Encounter (HOSPITAL_COMMUNITY): Payer: Self-pay | Admitting: Physical Medicine and Rehabilitation

## 2024-09-21 ENCOUNTER — Other Ambulatory Visit: Payer: Self-pay

## 2024-09-21 ENCOUNTER — Inpatient Hospital Stay (HOSPITAL_COMMUNITY)
Admission: AD | Admit: 2024-09-21 | Discharge: 2024-09-24 | DRG: 057 | Disposition: A | Discharge status: Other Acute Inpatient Hospital | Source: Intra-hospital | Attending: Physical Medicine and Rehabilitation | Admitting: Physical Medicine and Rehabilitation

## 2024-09-21 DIAGNOSIS — I69034 Monoplegia of upper limb following nontraumatic subarachnoid hemorrhage affecting left non-dominant side: Secondary | ICD-10-CM | POA: Diagnosis not present

## 2024-09-21 DIAGNOSIS — Z79899 Other long term (current) drug therapy: Secondary | ICD-10-CM | POA: Diagnosis not present

## 2024-09-21 DIAGNOSIS — K469 Unspecified abdominal hernia without obstruction or gangrene: Secondary | ICD-10-CM | POA: Diagnosis not present

## 2024-09-21 DIAGNOSIS — Z87891 Personal history of nicotine dependence: Secondary | ICD-10-CM

## 2024-09-21 DIAGNOSIS — E871 Hypo-osmolality and hyponatremia: Secondary | ICD-10-CM | POA: Diagnosis not present

## 2024-09-21 DIAGNOSIS — H9192 Unspecified hearing loss, left ear: Secondary | ICD-10-CM | POA: Diagnosis present

## 2024-09-21 DIAGNOSIS — R54 Age-related physical debility: Secondary | ICD-10-CM | POA: Diagnosis present

## 2024-09-21 DIAGNOSIS — I69098 Other sequelae following nontraumatic subarachnoid hemorrhage: Secondary | ICD-10-CM

## 2024-09-21 DIAGNOSIS — K567 Ileus, unspecified: Secondary | ICD-10-CM | POA: Diagnosis not present

## 2024-09-21 DIAGNOSIS — I609 Nontraumatic subarachnoid hemorrhage, unspecified: Principal | ICD-10-CM

## 2024-09-21 DIAGNOSIS — Z9079 Acquired absence of other genital organ(s): Secondary | ICD-10-CM

## 2024-09-21 DIAGNOSIS — I639 Cerebral infarction, unspecified: Secondary | ICD-10-CM | POA: Diagnosis not present

## 2024-09-21 DIAGNOSIS — Z681 Body mass index (BMI) 19 or less, adult: Secondary | ICD-10-CM | POA: Diagnosis not present

## 2024-09-21 DIAGNOSIS — R1310 Dysphagia, unspecified: Secondary | ICD-10-CM | POA: Diagnosis not present

## 2024-09-21 DIAGNOSIS — E876 Hypokalemia: Secondary | ICD-10-CM | POA: Diagnosis present

## 2024-09-21 DIAGNOSIS — A86 Unspecified viral encephalitis: Secondary | ICD-10-CM | POA: Diagnosis present

## 2024-09-21 DIAGNOSIS — Z8546 Personal history of malignant neoplasm of prostate: Secondary | ICD-10-CM

## 2024-09-21 DIAGNOSIS — Z515 Encounter for palliative care: Secondary | ICD-10-CM | POA: Diagnosis not present

## 2024-09-21 DIAGNOSIS — E43 Unspecified severe protein-calorie malnutrition: Secondary | ICD-10-CM | POA: Diagnosis not present

## 2024-09-21 DIAGNOSIS — R627 Adult failure to thrive: Secondary | ICD-10-CM | POA: Diagnosis not present

## 2024-09-21 DIAGNOSIS — I69019 Unspecified symptoms and signs involving cognitive functions following nontraumatic subarachnoid hemorrhage: Secondary | ICD-10-CM

## 2024-09-21 DIAGNOSIS — K219 Gastro-esophageal reflux disease without esophagitis: Secondary | ICD-10-CM | POA: Diagnosis present

## 2024-09-21 DIAGNOSIS — Z9884 Bariatric surgery status: Secondary | ICD-10-CM | POA: Diagnosis not present

## 2024-09-21 DIAGNOSIS — I69091 Dysphagia following nontraumatic subarachnoid hemorrhage: Secondary | ICD-10-CM | POA: Diagnosis not present

## 2024-09-21 DIAGNOSIS — N401 Enlarged prostate with lower urinary tract symptoms: Secondary | ICD-10-CM | POA: Diagnosis present

## 2024-09-21 DIAGNOSIS — R059 Cough, unspecified: Secondary | ICD-10-CM | POA: Diagnosis not present

## 2024-09-21 DIAGNOSIS — Z4682 Encounter for fitting and adjustment of non-vascular catheter: Secondary | ICD-10-CM | POA: Diagnosis not present

## 2024-09-21 DIAGNOSIS — Z7189 Other specified counseling: Secondary | ICD-10-CM | POA: Diagnosis not present

## 2024-09-21 DIAGNOSIS — E785 Hyperlipidemia, unspecified: Secondary | ICD-10-CM | POA: Diagnosis not present

## 2024-09-21 DIAGNOSIS — R64 Cachexia: Secondary | ICD-10-CM | POA: Diagnosis not present

## 2024-09-21 DIAGNOSIS — I10 Essential (primary) hypertension: Secondary | ICD-10-CM | POA: Diagnosis present

## 2024-09-21 DIAGNOSIS — R7303 Prediabetes: Secondary | ICD-10-CM | POA: Diagnosis not present

## 2024-09-21 DIAGNOSIS — I69022 Dysarthria following nontraumatic subarachnoid hemorrhage: Secondary | ICD-10-CM | POA: Diagnosis not present

## 2024-09-21 DIAGNOSIS — Z8661 Personal history of infections of the central nervous system: Secondary | ICD-10-CM

## 2024-09-21 DIAGNOSIS — K559 Vascular disorder of intestine, unspecified: Secondary | ICD-10-CM | POA: Diagnosis not present

## 2024-09-21 DIAGNOSIS — R338 Other retention of urine: Secondary | ICD-10-CM | POA: Diagnosis present

## 2024-09-21 DIAGNOSIS — Z887 Allergy status to serum and vaccine status: Secondary | ICD-10-CM

## 2024-09-21 DIAGNOSIS — R2689 Other abnormalities of gait and mobility: Secondary | ICD-10-CM | POA: Diagnosis present

## 2024-09-21 DIAGNOSIS — J90 Pleural effusion, not elsewhere classified: Secondary | ICD-10-CM | POA: Diagnosis not present

## 2024-09-21 DIAGNOSIS — Z974 Presence of external hearing-aid: Secondary | ICD-10-CM

## 2024-09-21 DIAGNOSIS — R140 Abdominal distension (gaseous): Secondary | ICD-10-CM | POA: Diagnosis not present

## 2024-09-21 DIAGNOSIS — R638 Other symptoms and signs concerning food and fluid intake: Secondary | ICD-10-CM | POA: Diagnosis not present

## 2024-09-21 DIAGNOSIS — N179 Acute kidney failure, unspecified: Secondary | ICD-10-CM | POA: Diagnosis not present

## 2024-09-21 DIAGNOSIS — J189 Pneumonia, unspecified organism: Secondary | ICD-10-CM | POA: Diagnosis not present

## 2024-09-21 DIAGNOSIS — J9601 Acute respiratory failure with hypoxia: Secondary | ICD-10-CM | POA: Diagnosis not present

## 2024-09-21 DIAGNOSIS — Z789 Other specified health status: Secondary | ICD-10-CM | POA: Diagnosis not present

## 2024-09-21 DIAGNOSIS — K56609 Unspecified intestinal obstruction, unspecified as to partial versus complete obstruction: Secondary | ICD-10-CM | POA: Diagnosis not present

## 2024-09-21 DIAGNOSIS — I700 Atherosclerosis of aorta: Secondary | ICD-10-CM | POA: Diagnosis not present

## 2024-09-21 DIAGNOSIS — L8995 Pressure ulcer of unspecified site, unstageable: Secondary | ICD-10-CM | POA: Diagnosis not present

## 2024-09-21 DIAGNOSIS — R0989 Other specified symptoms and signs involving the circulatory and respiratory systems: Secondary | ICD-10-CM | POA: Diagnosis not present

## 2024-09-21 LAB — GLUCOSE, CAPILLARY
Glucose-Capillary: 169 mg/dL — ABNORMAL HIGH (ref 70–99)
Glucose-Capillary: 169 mg/dL — ABNORMAL HIGH (ref 70–99)
Glucose-Capillary: 191 mg/dL — ABNORMAL HIGH (ref 70–99)
Glucose-Capillary: 200 mg/dL — ABNORMAL HIGH (ref 70–99)

## 2024-09-21 LAB — RENAL FUNCTION PANEL
Albumin: 2.2 g/dL — ABNORMAL LOW (ref 3.5–5.0)
Anion gap: 9 (ref 5–15)
BUN: 30 mg/dL — ABNORMAL HIGH (ref 8–23)
CO2: 25 mmol/L (ref 22–32)
Calcium: 6.6 mg/dL — ABNORMAL LOW (ref 8.9–10.3)
Chloride: 90 mmol/L — ABNORMAL LOW (ref 98–111)
Creatinine, Ser: 0.54 mg/dL — ABNORMAL LOW (ref 0.61–1.24)
GFR, Estimated: >60 mL/min (ref >60)
Glucose, Bld: 176 mg/dL — ABNORMAL HIGH (ref 70–99)
Phosphorus: 2.2 mg/dL — ABNORMAL LOW (ref 2.5–4.6)
Potassium: 3.3 mmol/L — ABNORMAL LOW (ref 3.5–5.1)
Sodium: 124 mmol/L — ABNORMAL LOW (ref 135–145)

## 2024-09-21 LAB — CREATININE, SERUM
Creatinine, Ser: 0.57 mg/dL — ABNORMAL LOW (ref 0.61–1.24)
GFR, Estimated: >60 mL/min (ref >60)

## 2024-09-21 MED ORDER — THIAMINE MONONITRATE 100 MG PO TABS
100.0000 mg | ORAL_TABLET | Freq: Every day | ORAL | Status: AC
  Administered 2024-09-22: 100 mg
  Filled 2024-09-21: qty 1

## 2024-09-21 MED ORDER — VITAMIN B-12 1000 MCG PO TABS: 500.0000 ug | ORAL_TABLET | Freq: Every day | ORAL | Status: DC

## 2024-09-21 MED ORDER — POLYETHYLENE GLYCOL 3350 17 G PO PACK
17.0000 g | PACK | Freq: Every day | ORAL | Status: DC
  Administered 2024-09-22: 17 g
  Filled 2024-09-21 (×3): qty 1

## 2024-09-21 MED ORDER — SENNA 8.6 MG PO TABS
2.0000 | ORAL_TABLET | Freq: Every day | ORAL | Status: DC
  Administered 2024-09-22 – 2024-09-24 (×3): 17.2 mg
  Filled 2024-09-21 (×3): qty 2

## 2024-09-21 MED ORDER — ADULT MULTIVITAMIN W/MINERALS CH: 1.0000 | ORAL_TABLET | Freq: Every day | ORAL | Status: DC

## 2024-09-21 MED ORDER — UREA 15 G PO PACK
15.0000 g | PACK | Freq: Two times a day (BID) | ORAL | Status: DC
  Filled 2024-09-21 (×2): qty 1

## 2024-09-21 MED ORDER — ONDANSETRON HCL 4 MG/2ML IJ SOLN
4.0000 mg | Freq: Four times a day (QID) | INTRAMUSCULAR | Status: DC | PRN
  Administered 2024-09-21: 4 mg via INTRAVENOUS
  Filled 2024-09-21: qty 2

## 2024-09-21 MED ORDER — ROSUVASTATIN CALCIUM 5 MG PO TABS
10.0000 mg | ORAL_TABLET | Freq: Every day | ORAL | Status: DC
  Administered 2024-09-22 – 2024-09-24 (×3): 10 mg
  Filled 2024-09-21 (×3): qty 2

## 2024-09-21 MED ORDER — ENOXAPARIN SODIUM 30 MG/0.3ML IJ SOSY: 30.0000 mg | PREFILLED_SYRINGE | INTRAMUSCULAR | Status: DC

## 2024-09-21 MED ORDER — SODIUM CHLORIDE 1 G PO TABS: 2.0000 g | ORAL_TABLET | Freq: Three times a day (TID) | ORAL | 0 refills | Status: DC

## 2024-09-21 MED ORDER — POTASSIUM & SODIUM PHOSPHATES 280-160-250 MG PO PACK
1.0000 | PACK | Freq: Three times a day (TID) | ORAL | Status: DC
  Administered 2024-09-21 – 2024-09-24 (×10): 1
  Filled 2024-09-21 (×13): qty 1

## 2024-09-21 MED ORDER — KATE FARMS STANDARD 1.4 EN LIQD: 325.0000 mL | Freq: Two times a day (BID) | ENTERAL | Status: DC

## 2024-09-21 MED ORDER — VITAMIN B-12 1000 MCG PO TABS
500.0000 ug | ORAL_TABLET | Freq: Every day | ORAL | Status: DC
  Administered 2024-09-22 – 2024-09-24 (×3): 500 ug
  Filled 2024-09-21 (×3): qty 1

## 2024-09-21 MED ORDER — ENOXAPARIN SODIUM 30 MG/0.3ML IJ SOSY
30.0000 mg | PREFILLED_SYRINGE | INTRAMUSCULAR | Status: DC
  Administered 2024-09-21 – 2024-09-23 (×3): 30 mg via SUBCUTANEOUS
  Filled 2024-09-21 (×3): qty 0.3

## 2024-09-21 MED ORDER — POTASSIUM & SODIUM PHOSPHATES 280-160-250 MG PO PACK
1.0000 | PACK | Freq: Three times a day (TID) | ORAL | Status: DC
  Filled 2024-09-21 (×2): qty 1

## 2024-09-21 MED ORDER — ROSUVASTATIN CALCIUM 5 MG PO TABS: 10.0000 mg | ORAL_TABLET | Freq: Every day | ORAL | Status: DC

## 2024-09-21 MED ORDER — POTASSIUM PHOSPHATES 15 MMOLE/5ML IV SOLN: 30.0000 mmol | Freq: Four times a day (QID) | INTRAVENOUS | Status: DC

## 2024-09-21 MED ORDER — FUROSEMIDE 10 MG/ML IJ SOLN
20.0000 mg | Freq: Once | INTRAMUSCULAR | Status: AC
  Administered 2024-09-21: 20 mg via INTRAVENOUS
  Filled 2024-09-21: qty 4

## 2024-09-21 MED ORDER — ONDANSETRON HCL 4 MG PO TABS: 4.0000 mg | ORAL_TABLET | Freq: Four times a day (QID) | ORAL | Status: DC | PRN

## 2024-09-21 MED ORDER — ACETAMINOPHEN 325 MG PO TABS: 650.0000 mg | ORAL_TABLET | Freq: Four times a day (QID) | ORAL | Status: DC | PRN

## 2024-09-21 MED ORDER — DEXTROSE 5 % IV SOLN: 455.0000 mg | Freq: Two times a day (BID) | INTRAVENOUS | Status: AC

## 2024-09-21 MED ORDER — SODIUM CHLORIDE 1 G PO TABS: 2.0000 g | ORAL_TABLET | Freq: Three times a day (TID) | ORAL | Status: DC

## 2024-09-21 MED ORDER — UREA 15 G PO PACK: 15.0000 g | PACK | Freq: Two times a day (BID) | ORAL | 0 refills | Status: DC

## 2024-09-21 MED ORDER — FUROSEMIDE 20 MG PO TABS: 20.0000 mg | ORAL_TABLET | Freq: Every day | ORAL | Status: DC

## 2024-09-21 MED ORDER — SODIUM CHLORIDE 1 G PO TABS
2.0000 g | ORAL_TABLET | Freq: Three times a day (TID) | ORAL | Status: DC
  Administered 2024-09-22: 2 g
  Filled 2024-09-21: qty 2

## 2024-09-21 MED ORDER — THIAMINE MONONITRATE 100 MG PO TABS
100.0000 mg | ORAL_TABLET | Freq: Every day | ORAL | Status: DC
  Filled 2024-09-21: qty 1

## 2024-09-21 MED ORDER — LOPERAMIDE HCL 2 MG PO CAPS: 2.0000 mg | ORAL_CAPSULE | ORAL | Status: DC | PRN

## 2024-09-21 MED ORDER — FUROSEMIDE 20 MG PO TABS: 20.0000 mg | ORAL_TABLET | Freq: Every day | ORAL | 0 refills | Status: DC

## 2024-09-21 MED ORDER — AMLODIPINE BESYLATE 5 MG PO TABS
5.0000 mg | ORAL_TABLET | Freq: Every day | ORAL | Status: DC
  Administered 2024-09-22 – 2024-09-23 (×2): 5 mg
  Filled 2024-09-21 (×3): qty 1

## 2024-09-21 MED ORDER — TAMSULOSIN HCL 0.4 MG PO CAPS
0.4000 mg | ORAL_CAPSULE | Freq: Every day | ORAL | Status: DC
  Administered 2024-09-22: 0.4 mg via ORAL
  Filled 2024-09-21 (×2): qty 1

## 2024-09-21 MED ORDER — TAMSULOSIN HCL 0.4 MG PO CAPS: 0.4000 mg | ORAL_CAPSULE | Freq: Every day | ORAL | 0 refills | Status: DC

## 2024-09-21 MED ORDER — POTASSIUM CHLORIDE 20 MEQ PO PACK
40.0000 meq | PACK | Freq: Three times a day (TID) | ORAL | Status: DC
Start: 2024-09-21 — End: 2024-09-21
  Filled 2024-09-21: qty 2

## 2024-09-21 MED ORDER — DEXTROSE 5 % IV SOLN
455.0000 mg | Freq: Two times a day (BID) | INTRAVENOUS | Status: AC
  Administered 2024-09-21 – 2024-09-22 (×3): 455 mg via INTRAVENOUS
  Filled 2024-09-21 (×3): qty 9.1

## 2024-09-21 MED ORDER — SODIUM CHLORIDE 0.9 % IV SOLN: INTRAVENOUS | Status: AC

## 2024-09-21 MED ORDER — PANTOPRAZOLE SODIUM 40 MG PO TBEC
40.0000 mg | DELAYED_RELEASE_TABLET | Freq: Every day | ORAL | Status: DC
  Filled 2024-09-21: qty 1

## 2024-09-21 MED ORDER — ADULT MULTIVITAMIN W/MINERALS CH
1.0000 | ORAL_TABLET | Freq: Every day | ORAL | Status: DC
  Administered 2024-09-22 – 2024-09-24 (×3): 1
  Filled 2024-09-21 (×3): qty 1

## 2024-09-21 MED ORDER — POLYETHYLENE GLYCOL 3350 17 G PO PACK
17.0000 g | PACK | Freq: Every day | ORAL | Status: DC
  Filled 2024-09-21: qty 1

## 2024-09-21 MED ORDER — UREA 15 G PO PACK
15.0000 g | PACK | Freq: Two times a day (BID) | ORAL | Status: DC
  Filled 2024-09-21: qty 1

## 2024-09-21 MED ORDER — FUROSEMIDE 20 MG PO TABS
20.0000 mg | ORAL_TABLET | Freq: Every day | ORAL | Status: DC
  Administered 2024-09-22 – 2024-09-23 (×2): 20 mg
  Filled 2024-09-21 (×3): qty 1

## 2024-09-21 MED ORDER — POTASSIUM CHLORIDE 20 MEQ PO PACK
40.0000 meq | PACK | Freq: Three times a day (TID) | ORAL | Status: DC
  Administered 2024-09-21 (×2): 40 meq via ORAL
  Filled 2024-09-21 (×2): qty 2

## 2024-09-21 MED ORDER — POTASSIUM CHLORIDE 20 MEQ PO PACK
40.0000 meq | PACK | Freq: Once | ORAL | Status: AC
  Administered 2024-09-21: 40 meq via ORAL

## 2024-09-21 MED ORDER — SENNA 8.6 MG PO TABS: 2.0000 | ORAL_TABLET | Freq: Every day | ORAL | Status: DC

## 2024-09-21 MED ORDER — ROSUVASTATIN CALCIUM 10 MG PO TABS: 10.0000 mg | ORAL_TABLET | Freq: Every day | ORAL | 0 refills | Status: DC

## 2024-09-21 MED ORDER — AMLODIPINE BESYLATE 5 MG PO TABS: 5.0000 mg | ORAL_TABLET | Freq: Every day | ORAL | 0 refills | Status: DC

## 2024-09-21 MED ORDER — AMLODIPINE BESYLATE 5 MG PO TABS: 5.0000 mg | ORAL_TABLET | Freq: Every day | ORAL | Status: DC

## 2024-09-21 MED ORDER — KATE FARMS STANDARD 1.4 EN LIQD
325.0000 mL | Freq: Two times a day (BID) | ENTERAL | Status: DC
  Administered 2024-09-22 (×2): 325 mL
  Filled 2024-09-21 (×4): qty 325

## 2024-09-21 NOTE — Progress Notes (Addendum)
 [facility] NEPHROLOGY PROGRESS NOTE  Assessment/ Plan: Pt is a 83 y.o. 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  # Acute on chronic hyponatremia likely combination of SIADH in the setting of recent CNS infection.  The recent worsening hyponatremia likely also contributed by high-dose Solu-Medrol and IV fluid. -Since IV fluid is required with acyclovir treatment I will further reduce the dose to 20 cc an hour. -Continue salt tablet, urea  to increase solute. -Continue to replete potassium, phosphorus. -Continue IV Lasix to increase free water excretion. - Monitor lab.  # Acute kidney injury which is resolved now.  # Hypokalemia: Likely increased urinary excretion of potassium due to polyuria.  I will continue to replete potassium chloride and also manage hypomagnesemia.  # Varicella-zoster viral encephalitis, small vessel stroke, acute metabolic encephalopathy: Currently on high-dose steroid, acyclovir.  # Hypophosphatemia: Continue to replete potassium phosphate, encourage oral intake.  Monitor lab.  Subjective: Seen and examined at the bedside.  Urine output is around 1.7 L.  He is more groggy today.  No family member at the bedside. Objective Vital signs in last 24 hours: Vitals:   09/20/24 1707 09/20/24 2028 09/20/24 2336 09/21/24 0452  BP: 120/70 119/79 128/71 118/79  Pulse: (!) 59 70 93 98  Resp:  15 14 16   Temp: 98.7 F (37.1 C) 97.8 F (36.6 C) 99.1 F (37.3 C) 98.3 F (36.8 C)  TempSrc: Oral Oral Oral Axillary  SpO2: 95% 97% 98% 98%  Weight:      Height:       Weight change:   Intake/Output Summary (Last 24 hours) at 09/21/2024 0911 Last data filed at 09/21/2024 0500 Gross per 24 hour  Intake 2700.12 ml  Output 1750 ml   Net 950.12 ml       Labs: RENAL PANEL Recent Labs  Lab 09/16/24 0314 09/17/24 0130 09/17/24 1046 09/18/24 0254 09/18/24 0255 09/19/24 0159 09/20/24 0140 09/21/24 0501  NA 129* 124* 126* 124*  --  123* 123* 124*  K 3.8 3.3* 3.6 3.2*  --  3.5 3.2* 3.3*  CL 99 93* 95* 94*  --  92* 90* 90*  CO2 20* 20* 19* 21*  --  20* 22 25  GLUCOSE 209* 195* 155* 164*  --  196* 183* 176*  BUN 16 15 15 13   --  14 16 30*  CREATININE 0.68 0.59* 0.55* 0.53*  --  0.57* 0.46* 0.54*  CALCIUM 7.6* 7.3* 7.3* 7.4*  --  7.1* 7.0* 6.6*  MG 1.7 1.7  --   --  1.6* 2.0  --   --   PHOS 2.1* 1.9*  1.9* 2.1* 2.7 2.6 2.0* <1.0* 2.2*  ALBUMIN  --  2.3* 2.3* 2.3*  --  2.4* 2.4* 2.2*    Liver Function Tests: Recent Labs  Lab 09/19/24 0159 09/20/24 0140 09/21/24 0501  ALBUMIN 2.4* 2.4* 2.2*   No results for input(s): LIPASE, AMYLASE in the last 168 hours. No results for input(s): AMMONIA in the last 168 hours. CBC: Recent Labs    09/09/24 1820 09/10/24 0255 09/11/24 0258 09/12/24 0158 09/13/24 0155 09/14/24 0244 09/15/24 0141  HGB  --    < > 13.4 15.5 15.4 13.1 12.9*  MCV  --    < > 85.4 84.7 85.7 85.0 87.8  VITAMINB12 267  --   --   --   --   --   --    < > =  values in this interval not displayed.    Cardiac Enzymes: No results for input(s): CKTOTAL, CKMB, CKMBINDEX, TROPONINI in the last 168 hours. CBG: Recent Labs  Lab 09/20/24 1140 09/20/24 1618 09/20/24 2014 09/21/24 0040 09/21/24 0415  GLUCAP 177* 179* 147* 200* 191*    Iron Studies: No results for input(s): IRON, TIBC, TRANSFERRIN, FERRITIN in the last 72 hours. Studies/Results: No results found.   Medications: Infusions:  sodium chloride  25 mL/hr at 09/21/24 0500   acyclovir (ZOVIRAX) 455 mg in dextrose  5 % 100 mL IVPB Stopped (09/20/24 2349)   chlorproMAZINE (THORAZINE) 25 mg in sodium chloride  0.9 % 25 mL IVPB Stopped (09/21/24 0203)   feeding supplement (OSMOLITE 1.5 CAL) 40 mL/hr at 09/21/24  0500   potassium PHOSPHATE IVPB (in mmol)      Scheduled Medications:  amLODipine  5 mg Oral Daily   Chlorhexidine Gluconate Cloth  6 each Topical Daily   enoxaparin (LOVENOX) injection  30 mg Subcutaneous Q24H   feeding supplement (KATE FARMS STANDARD ENT 1.4)  325 mL Oral BID BM   feeding supplement (PROSource TF20)  60 mL Per Tube Daily   furosemide  20 mg Intravenous Once   lidocaine   1 Application Urethral Once   multivitamin with minerals  1 tablet Oral Daily   pantoprazole   40 mg Oral Daily   polyethylene glycol  17 g Oral Daily   potassium & sodium phosphates  1 packet Oral TID AC & HS   potassium chloride  40 mEq Oral TID   rosuvastatin  10 mg Oral Daily   senna  2 tablet Oral Daily   sodium chloride   2 g Oral TID WC   tamsulosin  0.4 mg Oral QPC supper   thiamine  100 mg Oral Daily   urea   15 g Oral BID   vitamin B-12  500 mcg Oral Daily    have reviewed scheduled and prn medications.  Physical Exam: General: Elderly male, frail,  NG tube. Heart:RRR, s1s2 nl Lungs:clear b/l, no crackle Abdomen:soft,  non-distended, ostomy bag. Extremities:No edema Neurology: Alert awake  Noah Burnett Noah Burnett 09/21/2024,9:11 AM  LOS: 13 days

## 2024-09-21 NOTE — Progress Notes (Signed)
 PMR Admission Coordinator Pre-Admission Assessment   Patient: AMAAN MEYER is an 83 y.o., male MRN: [id number] DOB: 17-Apr-1941 Height: 5' 7 (170.2 cm) Weight: 49.1 kg   Insurance Information HMO: yes    PPO: yes     PCP:      IPA:      80/20:      OTHER:  PRIMARY: [employer] Medicare HMO/PPO      Policy#:  [id number]     Subscriber: Pt CM Name: Rowan      Phone#: [phone]     Fax#: [phone] Pre-Cert#: [id number]  I received auth for CIR from Tiffany with [employer] for admit 09/15/24  through 09/21/24.  Updates due to 09/21/24 at fax listed above.    Employer:  Benefits:  Phone #:      Name:  Eff. Date: 11/09/20     Deduct: 0      Out of Pocket Max: 5,900 [phone] met)      Life Max: n/a CIR: $300/day for days 1-6      SNF: $10/day Outpatient: $30/ visit      Home Health: $0  DME: 20%      Providers: in network SECONDARY:       Policy#:      Phone#:    Artist:       Phone#:    The Data Processing Manager" for patients in Inpatient Rehabilitation Facilities with attached "Privacy Act Statement-Health Care Records" was provided and verbally reviewed with: Pt.   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  He had an EGD that showed hiatal hernia and compression of the second portion of duodenum with possible intraluminal neoplasm.   Biopsies were obtained which showed severely inflamed and ulcerated duodenal mucosa, but no evidence of malignancy.  A second EGD was done with ultrasound guidance but again pathology of the lesion extensive necrosis.  Surgical oncology consulted underwent robotic gastrojejunostomy on 8/25.  He did have delayed return of bowel function and a PICC line with TPN for several days until he had advancement of his diet.  Since his procedure he had had fairly steady decline with decreasing appetite significant weight loss and inability to walk.  He also had follow-up surgical oncology.  He had repeat MRI which did not show the duodenal mass.  Per chart review patient lives alone.  1 level home one-step to entry.  He has required assist from his family for ADLs and use of a rolling walker for mobility the past 2 weeks and has experienced 1 fall in the past 2 weeks.  Presented 09/08/2024 to [facility] with altered mental status and unwitnessed fall as well as reports of generalized weakness and fatigue.  In the ED abdominal x-ray showed no evidence of obstruction.  Cranial  CT scan negative for acute changes.  MRI showed multifocal subcortical contrast-enhancement, greatest at the right parietal-occipital fissure but present at multiple subcortical locations in both hemispheres.  Small areas of diffusion abnormality at the anterior left insula, left frontal operculum, right frontal operculum, and in multiple locations of both corona radiata and some lesions appearing more acute than others based on lower ADC values.  There was multiple possibilities raised by above findings considerations infectious versus inflammatory encephalitis versus vasculitis in CSF.  Patient was transferred to [facility] for further evaluation.  Chemistries unremarkable except sodium 128 potassium 3.4 chloride 90 AST 14, total CK of 30, TSH 4.632, a.m. cortisol 30, serum osmolality 257, urine sodium 137, urine osmolality 490  urinalysis negative for pyuria.  Neurology was consulted for evaluation of acute encephalopathy as well as nephrology consulted for hyponatremia.  Patient underwent fluoroscopic guided lumbar puncture and fluid sent for analysis for evaluation of meningitis encephalitis.  Procalcitonin negative, CRP 4.1.  EEG showed diffuse cerebral dysfunction no seizure seen throughout the recording.  VZV PCR was positive, varicella IgG negative.  On the morning of 09/13/2024 patient noted to have left gaze preference and decreased responsiveness code stroke was called patient was seen by neurology services.  Stat CT was done which showed left superior convexity subarachnoid hemorrhage without mass effect.  Otherwise no changes from previous CT.  CT angiogram ruled out large vessel occlusion.  Patient was initially kept n.p.o. follow-up speech therapy.  Eventually MRI completed showed prominent patchy cortical and subcortical signal abnormality throughout both hemispheres overall slight progressed from 09/09/2024 and intermediate although encephalitis and vasculitis remain leading considerations.  ID recommended continuing acyclovir.  Patient's mental status continued to improve.  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  Patient was also started on high-dose methylprednisolone by neurology 09/14/2024 with the plan to continue for 5 days followed by taper of prednisone. In regards to patient's hyponatremia felt to be chronic with nephrology follow-up possibly SIADH noted sodium in August 2025 was 129 and latest sodium 123-124  and currently remains on sodium chloride  tablets and the addition of urea  with recent worsening of hyponatremia likely attributed to recent high-dose Solu-Medrol.  The sodium level had actually worsened with normal saline and since IV fluid was required during acyclovir treatment reduce to 25 cc an hour and  received a dose of Lasix 09/19/2024 as well as 09/20/2024. Bouts of urinary retention on the evening of 09/13/2024 patient was retaining urine bladder scan for 900 cc orders given for nurse for straight caths however that was unsuccessful so coud catheter was placed and started on Flomax. AKI creatinine jumped from 0.76-1 0.66-2.07 09/14/2024 nephrology had been following at that time given IV fluids AKI presumed to be secondary to obstructive uropathy as well as contrast-induced.  AKI resolved 09/15/2024.  Patient also with bouts of hypophosphatemia supplemented with follow-up chemistries Patient was cleared to begin Lovenox for DVT prophylaxis. His diet has been advanced to mechanical soft initially requiring tube feeds for nutritional support.  Palliative care was consulted to establish goals of care.  Patient did have some persistent nausea and vomiting with KUB 09/17/2024 showing ileus.  He was having no tenderness with regular bowel movements reported. Therapy evaluations completed due to patient decreased functional mobility was admitted for a comprehensive rehab program.     Complete NIHSS TOTAL: 2   Patient's medical record from [facility] has  been reviewed by the rehabilitation admission coordinator and physician.   Past Medical History      Past Medical History:  Diagnosis Date   Arthritis     Bladder tumor     Deafness, left     Full dentures     GERD (gastroesophageal reflux disease)     History of [place] spotted fever      01-25-2020  per pt had in 2018 and was treated w/ no residual   Hyperplasia of prostate with lower urinary tract symptoms (LUTS)     Hypertension      followed by pcp   (01-25-2020  per pt had stress test approx. 2005, told normal , done in [place] somewhere)   Pre-diabetes      followed by pcp ,   watches diet   Prostate cancer [facility]) urologist--- dr watt    dx by bx01-13-2020,  Gleason 3+4 ;  MRI 12-19-2018, vol 96.26;    active survillance   Shingles     Wears glasses     Wears hearing aid in right ear            Has the patient had major surgery during 100 days prior to admission? Yes   Family History   family history is not on file.   Current Medications  Current Medications    Current Facility-Administered Medications:    0.9 %  sodium chloride  infusion, , Intravenous, Continuous, Dolan Mateo Larger, MD, Last Rate: 25 mL/hr at 09/21/24 0500, Infusion Verify at 09/21/24 0500   acetaminophen  (TYLENOL ) tablet 650 mg, 650 mg, Oral, Q6H PRN, Tat, Alm, MD, 650 mg at 09/17/24 0208   acyclovir (ZOVIRAX) 455 mg in dextrose  5 % 100 mL IVPB, 455 mg, Intravenous, Q12H, Laurence Jinnie BIRCH, RPH, Stopped at 09/20/24 2349   amLODipine (NORVASC) tablet 5 mg, 5 mg, Oral, Daily, Pahwani, Fredia, MD, 5 mg at 09/19/24 1513   Chlorhexidine Gluconate Cloth 2 % PADS 6 each, 6 each, Topical, Daily, Rathore, Vasundhra, MD, 6 each at 09/20/24 0930   chlorproMAZINE (THORAZINE) 25 mg in sodium chloride  0.9 % 25 mL IVPB, 25 mg, Intravenous, Q8H PRN, Vernon Fredia, MD, Stopped at 09/21/24 0203   enoxaparin (LOVENOX) injection 30 mg, 30 mg, Subcutaneous, Q24H, Tat, Alm, MD, 30 mg at 09/20/24 2217   feeding supplement (KATE FARMS STANDARD ENT 1.4) liquid 325 mL, 325 mL, Oral, BID BM, Pahwani, Ravi, MD, 325 mL at 09/20/24 0932   feeding supplement (OSMOLITE 1.5 CAL) liquid 1,000 mL, 1,000 mL, Per Tube, Continuous, Pahwani, Fredia, MD, Last Rate: 40 mL/hr at 09/21/24 0500, Infusion Verify at 09/21/24 0500   feeding supplement (PROSource TF20) liquid 60 mL, 60 mL, Per Tube, Daily, Pahwani, Ravi, MD, 60 mL at 09/20/24 0932   furosemide (LASIX) injection 20 mg, 20 mg, Intravenous, Once, Dolan Mateo Larger, MD   lidocaine  (XYLOCAINE ) 2 % jelly 1 Application, 1 Application, Urethral, Once, Vernon Fredia, MD   loperamide (IMODIUM) capsule 2 mg, 2 mg, Oral, PRN, Howerter, Justin B, DO, 2 mg at 09/11/24 2328   multivitamin with minerals  tablet 1 tablet, 1 tablet, Oral, Daily, Tat, David, MD, 1 tablet at 09/19/24 1044   ondansetron  (ZOFRAN ) tablet 4 mg, 4 mg, Oral, Q6H PRN **OR** ondansetron  (ZOFRAN ) injection 4 mg, 4 mg, Intravenous, Q6H PRN, Tat, David, MD, 4 mg at 09/18/24 9385   pantoprazole  (PROTONIX ) EC tablet 40 mg, 40 mg, Oral, Daily, Pahwani, Ravi, MD, 40 mg at 09/18/24 0919   polyethylene glycol (  MIRALAX / GLYCOLAX) packet 17 g, 17 g, Oral, Daily, Tat, David, MD, 17 g at 09/19/24 1043   potassium & sodium phosphates (PHOS-NAK) 280-160-250 MG packet 1 packet, 1 packet, Oral, TID AC & HS, Mansy, Jan A, MD, 1 packet at 09/21/24 0651   potassium chloride (KLOR-CON) packet 40 mEq, 40 mEq, Oral, TID, Dolan Mateo Larger, MD   rosuvastatin (CRESTOR) tablet 10 mg, 10 mg, Oral, Daily, Arora, Ashish, MD, 10 mg at 09/19/24 1044   senna (SENOKOT) tablet 17.2 mg, 2 tablet, Oral, Daily, Tat, David, MD, 17.2 mg at 09/19/24 1044   sodium chloride  tablet 2 g, 2 g, Oral, TID WC, Geralynn Charleston, MD, 2 g at 09/20/24 1709   tamsulosin (FLOMAX) capsule 0.4 mg, 0.4 mg, Oral, QPC supper, Pahwani, Ravi, MD, 0.4 mg at 09/20/24 1709   thiamine (VITAMIN B1) tablet 100 mg, 100 mg, Oral, Daily, Pahwani, Ravi, MD, 100 mg at 09/19/24 1044   urea  (URE-NA) oral packet 15 g, 15 g, Oral, BID, Dolan Mateo Larger, MD, 15 g at 09/20/24 2217   vitamin B-12 (CYANOCOBALAMIN) tablet 500 mcg, 500 mcg, Oral, Daily, Akula, Vijaya, MD, 500 mcg at 09/19/24 1043     Patients Current Diet:  Diet Order                  DIET DYS 3 Room service appropriate? Yes; Fluid consistency: Thin  Diet effective now                         Precautions / Restrictions Precautions Precautions: Fall Restrictions Weight Bearing Restrictions Per Provider Order: No    Has the patient had 2 or more falls or a fall with injury in the past year? Yes   Prior Activity Level Community (5-7x/wk): Pt. active in the community PTA   Prior Functional Level Self Care: Did the  patient need help bathing, dressing, using the toilet or eating? Independent   Indoor Mobility: Did the patient need assistance with walking from room to room (with or without device)? Independent   Stairs: Did the patient need assistance with internal or external stairs (with or without device)? Independent   Functional Cognition: Did the patient need help planning regular tasks such as shopping or remembering to take medications? Independent   Patient Information Are you of Hispanic, Latino/a,or Spanish origin?: A. No, not of Hispanic, Latino/a, or Spanish origin What is your race?: A. White Do you need or want an interpreter to communicate with a doctor or health care staff?: 0. No Patient information obtained via proxy : yes   Patient's Response To:  Health Literacy and Transportation Is the patient able to respond to health literacy and transportation needs?: No Health Literacy - How often do you need to have someone help you when you read instructions, pamphlets, or other written material from your doctor or pharmacy?: Patient unable to respond In the past 12 months, has lack of transportation kept you from medical appointments or from getting medications?: No In the past 12 months, has lack of transportation kept you from meetings, work, or from getting things needed for daily living?: No Higher Education Careers Adviser obtained via proxy: yes   Journalist, Newspaper / Equipment Home Equipment: Shower seat, Agricultural Consultant (2 wheels), Wheelchair - manual   Prior Device Use: Indicate devices/aids used by the patient prior to current illness, exacerbation or injury? Walker   Current Functional Level Cognition   Arousal/Alertness: Awake/alert Overall Cognitive Status: Impaired/Different  from baseline Orientation Level: Oriented to person, Oriented to place, Oriented to situation, Disoriented to time Attention: Sustained Sustained Attention: Impaired Sustained Attention  Impairment: Verbal basic, Functional basic Memory: Impaired Memory Impairment: Decreased short term memory, Decreased recall of new information, Retrieval deficit Decreased Short Term Memory: Verbal basic, Functional basic Awareness: Appears intact Problem Solving: Appears intact Safety/Judgment: Appears intact    Extremity Assessment (includes Sensation/Coordination)   Upper Extremity Assessment: Generalized weakness  Lower Extremity Assessment: Defer to PT evaluation     ADLs   Overall ADL's : Needs assistance/impaired Eating/Feeding: Set up Eating/Feeding Details (indicate cue type and reason): assistance opening containers Grooming: Sitting, Wash/dry hands, Wash/dry face, Set up Grooming Details (indicate cue type and reason): stood at sink with min assist and required mod assist to complete dentures care Upper Body Bathing: Minimal assistance, Sitting Upper Body Bathing Details (indicate cue type and reason): at sink Lower Body Bathing: Moderate assistance, Sit to/from stand Lower Body Bathing Details (indicate cue type and reason): assistance with peri area bathing while standing Upper Body Dressing : Minimal assistance, Sitting Upper Body Dressing Details (indicate cue type and reason): gown for back Lower Body Dressing: Maximal assistance, Sitting/lateral leans Lower Body Dressing Details (indicate cue type and reason): changed socks with patient assisting with doffing Toilet Transfer: Minimal assistance, Rolling walker (2 wheels) Toilet Transfer Details (indicate cue type and reason): 2 person HHA Toileting- Clothing Manipulation and Hygiene: Maximal assistance Functional mobility during ADLs: Minimal assistance, Moderate assistance General ADL Comments: focused on transfers and standing balance     Mobility   Overal bed mobility: Needs Assistance Bed Mobility: Supine to Sit Supine to sit: Min assist, Used rails, HOB elevated Sit to supine: Mod assist General bed  mobility comments: OOB in recliner     Transfers   Overall transfer level: Needs assistance Equipment used: Rolling walker (2 wheels) Transfers: Sit to/from Stand Sit to Stand: Min assist Bed to/from chair/wheelchair/BSC transfer type:: Step pivot Step pivot transfers: Min assist, +2 safety/equipment General transfer comment: cues for hand placement and min assist to power up     Ambulation / Gait / Stairs / Wheelchair Mobility   Ambulation/Gait Ambulation/Gait assistance: Min assist, Contact guard assist, +2 safety/equipment Gait Distance (Feet): 170 Feet Assistive device: Rolling walker (2 wheels) Gait Pattern/deviations: Step-through pattern, Decreased stride length, Trunk flexed General Gait Details: Pt demonstrates slow gait with low foot clearance despite cues. Cues for upright posture and RW management Gait velocity: decreased     Posture / Balance Dynamic Sitting Balance Sitting balance - Comments: in recliner without back support Balance Overall balance assessment: Needs assistance Sitting-balance support: Feet supported Sitting balance-Leahy Scale: Fair Sitting balance - Comments: in recliner without back support Postural control: Posterior lean, Other (comment) (anterior) Standing balance support: Single extremity supported, Bilateral upper extremity supported, No upper extremity supported, During functional activity Standing balance-Leahy Scale: Poor Standing balance comment: performd reaching tasks to simulate standing ADLs with min assist for balance     Special considerations/life events  Special service needs none    Previous Home Environment (from acute therapy documentation) Living Arrangements: Alone  Lives With: Alone Available Help at Discharge: Family, Available PRN/intermittently (they check in on him daily) Type of Home: House Home Layout: One level Home Access: Stairs to enter Entrance Stairs-Rails: None Entrance Stairs-Number of Steps: 1 Bathroom  Shower/Tub: Health Visitor: Handicapped height Bathroom Accessibility: Yes How Accessible: Accessible via walker, Accessible via wheelchair Home Care Services: No   Discharge Living  Setting Plans for Discharge Living Setting: Patient's home Type of Home at Discharge: House Discharge Home Layout: One level Discharge Home Access: Stairs to enter Entrance Stairs-Number of Steps: 1 Discharge Bathroom Shower/Tub: Walk-in shower Discharge Bathroom Toilet: Standard Discharge Bathroom Accessibility: Yes How Accessible: Accessible via walker, Accessible via wheelchair Does the patient have any problems obtaining your medications?: No   Social/Family/Support Systems Patient Roles: Other (Comment) Contact Information: [phone] Anticipated Caregiver: [name] Wingate Ability/Limitations of Caregiver: Daughter and other family can do 24/7 min a Caregiver Availability: 24/7 Discharge Plan Discussed with Primary Caregiver: Yes Is Caregiver In Agreement with Plan?: Yes Does Caregiver/Family have Issues with Lodging/Transportation while Pt is in Rehab?: No   Goals Patient/Family Goal for Rehab: PT/OT min A, SLP mod A Expected length of stay: 12-14 days Pt/Family Agrees to Admission and willing to participate: Yes Program Orientation Provided & Reviewed with Pt/Caregiver Including Roles  & Responsibilities: Yes   Decrease burden of Care through IP rehab admission: Not anticipated   Possible need for SNF placement upon discharge: Not anticipated   Patient Condition: I have reviewed medical records from [facility], spoken with CM, and patient and daughter. I met with patient at the bedside for inpatient rehabilitation assessment.  Patient will benefit from ongoing PT, OT, and SLP, can actively participate in 3 hours of therapy a day 5 days of the week, and can make measurable gains during the admission.  Patient will also benefit from the coordinated team approach  during an Inpatient Acute Rehabilitation admission.  The patient will receive intensive therapy as well as Rehabilitation physician, nursing, social worker, and care management interventions.  Due to safety, skin/wound care, disease management, medication administration, pain management, and patient education the patient requires 24 hour a day rehabilitation nursing.  The patient is currently Min A-mod A with mobility and basic ADLs.  Discharge setting and therapy post discharge at home with home health is anticipated.  Patient has agreed to participate in the Acute Inpatient Rehabilitation Program and will admit today.   Preadmission Screen Completed By:  Leita KATHEE Kleine, 09/21/2024 9:37 AM ______________________________________________________________________   Discussed status with Dr. Emeline on 09/21/24 at 938 and received approval for admission today.   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 Yes Co-Morbidities requiring supervision/potential complications: Multifocal CVAs, metabolic encephalopathy, hyponatremia with chronic SIADH, urinary retention, AKI, Duodenal ulcer s/p gastrojejunostomy 8/25, ileaus, hypertension, electrolyte deficiencies, and shingles  Due to bladder management, safety, skin/wound care, disease management, medication administration, pain management, and patient education, does the patient require 24 hr/day rehab nursing? Yes Does the patient require coordinated care of a physician, rehab nurse, PT, OT, and SLP to address physical and functional deficits in the context of the above medical diagnosis(es)? Yes Addressing deficits in the following areas: balance, endurance, locomotion, strength,  transferring, bowel/bladder control, bathing, dressing, feeding, grooming, toileting, and cognition Can the patient actively participate in an intensive therapy program of at least 3 hrs of therapy 5 days a week? Yes The potential for patient to make measurable gains while on inpatient rehab is good Anticipated functional outcomes upon discharge from inpatient rehab: min assist PT, min assist OT, mod assist SLP Estimated rehab length of stay to reach the above  functional goals is: 12-14 days Anticipated discharge destination: Home 10. Overall Rehab/Functional Prognosis: good     MD Signature:   Joesph JAYSON Likes, DO 09/21/2024  Revision History  Date/Time User Provider Type Action  09/21/2024 10:24 AM Likes Joesph JAYSON, DO Physician Sign  09/21/2024  9:38 AM Cecilie Leita NOVAK, CCC-SLP Rehab Admission Coordinator Share  09/20/2024  2:58 PM Cecilie Leita NOVAK RAEJEAN Rehab Admission Coordinator Share  09/17/2024  9:04 AM Cecilie Leita NOVAK, CCC-SLP Rehab Admission Coordinator Share  09/11/2024  2:04 PM Cecilie Leita NOVAK RAEJEAN Rehab Admission Coordinator Share  09/11/2024  1:43 PM Cecilie Leita NOVAK RAEJEAN Rehab Admission Coordinator

## 2024-09-21 NOTE — Plan of Care (Signed)
 Problem: Education: Goal: Knowledge of General Education information will improve Description: Including pain rating scale, medication(s)/side effects and non-pharmacologic comfort measures Outcome: Progressing   Problem: Health Behavior/Discharge Planning: Goal: Ability to manage health-related needs will improve Outcome: Progressing   Problem: Clinical Measurements: Goal: Ability to maintain clinical measurements within normal limits will improve Outcome: Progressing Goal: Will remain free from infection Outcome: Progressing Goal: Diagnostic test results will improve Outcome: Progressing Goal: Respiratory complications will improve Outcome: Progressing Goal: Cardiovascular complication will be avoided Outcome: Progressing   Problem: Activity: Goal: Risk for activity intolerance will decrease Outcome: Progressing   Problem: Nutrition: Goal: Adequate nutrition will be maintained Outcome: Progressing   Problem: Coping: Goal: Level of anxiety will decrease Outcome: Progressing   Problem: Elimination: Goal: Will not experience complications related to bowel motility Outcome: Progressing Goal: Will not experience complications related to urinary retention Outcome: Progressing   Problem: Pain Managment: Goal: General experience of comfort will improve and/or be controlled Outcome: Progressing   Problem: Safety: Goal: Ability to remain free from injury will improve Outcome: Progressing   Problem: Skin Integrity: Goal: Risk for impaired skin integrity will decrease Outcome: Progressing   Problem: Nutrition Goal: Patient maintains adequate hydration Outcome: Progressing Goal: Patient maintains weight Outcome: Progressing Goal: Patient/Family demonstrates understanding of diet Outcome: Progressing Goal: Patient/Family independently completes tube feeding Outcome: Progressing Goal: Patient will have no more than 5 lb weight change during LOS Outcome:  Progressing Goal: Patient will utilize adaptive techniques to administer nutrition Outcome: Progressing Goal: Patient will verbalize dietary restrictions Outcome: Progressing   Problem: Education: Goal: Ability to describe self-care measures that may prevent or decrease complications (Diabetes Survival Skills Education) will improve Outcome: Progressing Goal: Individualized Educational Video(s) Outcome: Progressing   Problem: Coping: Goal: Ability to adjust to condition or change in health will improve Outcome: Progressing   Problem: Fluid Volume: Goal: Ability to maintain a balanced intake and output will improve Outcome: Progressing   Problem: Health Behavior/Discharge Planning: Goal: Ability to identify and utilize available resources and services will improve Outcome: Progressing Goal: Ability to manage health-related needs will improve Outcome: Progressing   Problem: Metabolic: Goal: Ability to maintain appropriate glucose levels will improve Outcome: Progressing   Problem: Nutritional: Goal: Maintenance of adequate nutrition will improve Outcome: Progressing Goal: Progress toward achieving an optimal weight will improve Outcome: Progressing   Problem: Skin Integrity: Goal: Risk for impaired skin integrity will decrease Outcome: Progressing   Problem: Tissue Perfusion: Goal: Adequacy of tissue perfusion will improve Outcome: Progressing   Problem: Education: Goal: Knowledge of disease or condition will improve Outcome: Progressing Goal: Knowledge of secondary prevention will improve (MUST DOCUMENT ALL) Outcome: Progressing Goal: Knowledge of patient specific risk factors will improve (DELETE if not current risk factor) Outcome: Progressing   Problem: Ischemic Stroke/TIA Tissue Perfusion: Goal: Complications of ischemic stroke/TIA will be minimized Outcome: Progressing   Problem: Coping: Goal: Will verbalize positive feelings about self Outcome:  Progressing Goal: Will identify appropriate support needs Outcome: Progressing   Problem: Health Behavior/Discharge Planning: Goal: Ability to manage health-related needs will improve Outcome: Progressing Goal: Goals will be collaboratively established with patient/family Outcome: Progressing   Problem: Self-Care: Goal: Ability to participate in self-care as condition permits will improve Outcome: Progressing Goal: Verbalization of feelings and concerns over difficulty with self-care will improve Outcome: Progressing Goal: Ability to communicate needs accurately will improve Outcome: Progressing   Problem: Nutrition: Goal: Risk of aspiration will decrease Outcome: Progressing Goal: Dietary intake will improve Outcome: Progressing  Problem: Education: Goal: Knowledge of disease or condition will improve Outcome: Progressing Goal: Knowledge of secondary prevention will improve (MUST DOCUMENT ALL) Outcome: Progressing Goal: Knowledge of patient specific risk factors will improve (DELETE if not current risk factor) Outcome: Progressing

## 2024-09-21 NOTE — H&P (Addendum)
 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  He had an EGD that showed hiatal hernia and compression of the second portion of duodenum with possible intraluminal neoplasm.  Biopsies were obtained which showed severely inflamed and ulcerated duodenal mucosa, but no evidence of malignancy.  A second EGD was done with ultrasound guidance but again pathology of the lesion extensive necrosis.  Surgical oncology consulted underwent robotic gastrojejunostomy on 8/25.  He did have delayed return of bowel function and a PICC line with TPN for several days until he had advancement of his diet.  Since his procedure he had had fairly steady decline with decreasing appetite significant weight loss and inability to walk.  He also had follow-up surgical oncology.  He had repeat MRI which did not show the duodenal mass.  Per chart review patient lives alone.  1 level home one-step to entry.  He has required assist from his family for ADLs and use of a rolling walker for mobility the past 2 weeks and has experienced 1 fall in the past 2 weeks.  Presented 09/08/2024 to [facility] with altered mental status and unwitnessed fall as well as reports of generalized weakness and fatigue.  In the ED abdominal x-ray showed no evidence of obstruction.  Cranial CT scan negative for acute changes.  MRI showed multifocal subcortical contrast-enhancement, greatest at the right parietal-occipital fissure but present at multiple subcortical locations in both hemispheres.  Small areas of diffusion abnormality at the anterior  left insula, left frontal operculum, right frontal operculum, and in multiple locations of both corona radiata and some lesions appearing more acute than others based on lower ADC values.  There was multiple possibilities raised by above findings considerations infectious versus inflammatory encephalitis versus vasculitis in CSF.  Patient was transferred to [facility] for further evaluation.  Chemistries unremarkable except sodium 128 potassium 3.4 chloride 90 AST 14, total CK of 30, TSH 4.632, a.m. cortisol 30, serum osmolality 257, urine sodium 137, urine osmolality 490 urinalysis negative for pyuria.  Neurology was consulted for evaluation of acute encephalopathy as well as nephrology consulted for hyponatremia.  Patient underwent fluoroscopic guided lumbar puncture and fluid sent for analysis for evaluation of meningitis encephalitis.  Procalcitonin negative, CRP 4.1.  EEG showed diffuse cerebral dysfunction no seizure seen throughout the recording.  VZV PCR was positive, varicella IgG negative.  On the morning of 09/13/2024 patient noted to have left gaze preference and decreased responsiveness code stroke was called patient was seen by neurology services.  Stat CT was done which showed left superior convexity subarachnoid hemorrhage without mass effect.  Otherwise no changes from previous CT.  CT angiogram ruled out large vessel occlusion.  Patient was initially kept n.p.o. follow-up speech therapy.  Eventually MRI completed showed prominent patchy cortical and subcortical signal abnormality throughout both hemispheres overall slight progressed from 09/09/2024 and intermediate although encephalitis and vasculitis remain leading considerations.  ID recommended continuing acyclovir.  Patient's mental status continued to improve.  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  Patient was also started on high-dose  methylprednisolone by neurology 09/14/2024 with the plan to continue for 5 days followed by taper of prednisone. In regards to patient's hyponatremia felt to be chronic with nephrology follow-up possibly SIADH noted sodium in August 2025 was 129 and latest sodium 123-124  and currently remains on sodium chloride  tablets x 7 days and the addition of urea  x 6 doses with recent worsening of hyponatremia likely attributed to recent high-dose Solu-Medrol.  The sodium level had actually worsened with normal saline and since IV fluid was required during acyclovir treatment reduce to 25 cc an hour and received a dose of Lasix 09/19/2024 as well as 09/20/2024 and plan to continue Lasix at 20 mg p.o. daily. Bouts of urinary retention on the evening of 09/13/2024 patient was retaining urine bladder scan for 900 cc orders given for nurse for straight caths however that was unsuccessful so coud catheter was placed and started on Flomax. AKI creatinine jumped from 0.76-1 0.66-2.07 09/14/2024 nephrology had been following at that time given IV fluids AKI presumed to be secondary to obstructive uropathy as well as contrast-induced.  AKI resolved 09/15/2024.  Patient also with bouts of hypophosphatemia supplemented with follow-up chemistries Patient was cleared to begin Lovenox for DVT prophylaxis. His diet has been advanced to mechanical soft initially requiring tube feeds for nutritional support.  Palliative care was consulted to establish goals of care.  Patient did have some persistent nausea and vomiting with KUB 09/17/2024 showing ileus.  He was having no tenderness with regular bowel movements reported. Therapy evaluations completed due to patient decreased functional mobility was admitted for a comprehensive rehab program.       Review of Systems  Constitutional:  Positive for malaise/fatigue. Negative for fever.  HENT:         Decreased hearing on the left  Eyes:  Negative for blurred vision and double vision.   Respiratory:  Negative for cough, shortness of breath and wheezing.   Cardiovascular:  Negative for chest pain and leg swelling.  Gastrointestinal:  Positive for constipation, nausea and vomiting.       GERD  Genitourinary:  Positive for frequency and urgency. Negative for dysuria, flank pain and hematuria.  Musculoskeletal:  Positive for back pain, falls and myalgias.  Skin:  Negative for rash.  Neurological:  Positive for dizziness and weakness.  All other systems reviewed and are negative.      Past Medical History:  Diagnosis Date   Arthritis     Bladder tumor     Deafness, left     Full dentures     GERD (gastroesophageal reflux disease)     History of [place] spotted fever      01-25-2020  per pt had in 2018 and was treated w/ no residual   Hyperplasia of prostate with lower urinary tract symptoms (LUTS)     Hypertension      followed by pcp   (01-25-2020  per pt had stress test approx. 2005, told normal , done in [place] somewhere)   Pre-diabetes      followed by pcp ,   watches diet   Prostate cancer [facility]) urologist--- dr watt    dx by bx01-13-2020,  Gleason 3+4 ;  MRI 12-19-2018, vol 96.26;   active survillance   Shingles     Wears glasses     Wears hearing aid in right ear  Past Surgical History:  Procedure Laterality Date   SHOULDER ARTHROSCOPY WITH ROTATOR CUFF REPAIR AND SUBACROMIAL DECOMPRESSION Right 05-20-2009  @[facility]    TRANSURETHRAL RESECTION OF BLADDER TUMOR WITH MITOMYCIN -C N/A 02/01/2020    Procedure: TRANSURETHRAL RESECTION OF BLADDER TUMOR;  Surgeon: Watt Rush, MD;  Location: [facility];  Service: Urology;  Laterality: N/A;   TRANSURETHRAL RESECTION OF PROSTATE            History reviewed. No pertinent family history.     Social History:  reports that he quit smoking about 25 years ago. His smoking use included cigarettes. He started smoking about 61 years ago. He has a 36 pack-year smoking history. He has  been exposed to tobacco smoke. He has never used smokeless tobacco. He reports that he does not currently use alcohol. He reports that he does not use drugs. Allergies:  Allergies       Allergies  Allergen Reactions   Haemophilus Influenzae Vaccines Other (See Comments)      I get deathly sick            Medications Prior to Admission  Medication Sig Dispense Refill   acetaminophen  (TYLENOL ) 500 MG tablet Take 1,000 mg by mouth every 6 (six) hours as needed for headache or mild pain (pain score 1-3).       docusate sodium (COLACE) 100 MG capsule Take 100 mg by mouth 2 (two) times daily.       hydrochlorothiazide (HYDRODIURIL) 12.5 MG tablet Take 12.5 mg by mouth daily.       megestrol (MEGACE) 40 MG/ML suspension Take 400 mg by mouth every morning.       omeprazole (PRILOSEC) 40 MG capsule Take 40 mg by mouth daily.       polyethylene glycol (MIRALAX / GLYCOLAX) 17 g packet Take 17 g by mouth daily.                  Home: Home Living Family/patient expects to be discharged to:: Private residence Living Arrangements: Alone Available Help at Discharge: Family, Available PRN/intermittently (they check in on him daily) Type of Home: House Home Access: Stairs to enter [employer] of Steps: 1 Entrance Stairs-Rails: None Home Layout: One level Bathroom Shower/Tub: Health Visitor: Handicapped height Bathroom Accessibility: Yes Home Equipment: Information systems manager, Agricultural Consultant (2 wheels), Wheelchair - manual  Lives With: Alone   Functional History: Prior Function Prior Level of Function : Needs assist, Driving, History of Falls (last six months) Mobility Comments: RW for the last 2 weeks, no AD prior to that, fall morning prior to coming to ED ADLs Comments: ind with ADLs 2 weeks ago; family does meals, drives   Functional Status:  Mobility: Bed Mobility Overal bed mobility: Needs Assistance Bed Mobility: Supine to Sit Supine to sit: Min assist, Used  rails, HOB elevated Sit to supine: Mod assist General bed mobility comments: OOB in recliner Transfers Overall transfer level: Needs assistance Equipment used: Rolling walker (2 wheels) Transfers: Sit to/from Stand Sit to Stand: Min assist Bed to/from chair/wheelchair/BSC transfer type:: Step pivot Step pivot transfers: Min assist, +2 safety/equipment General transfer comment: cues for hand placement and min assist to power up Ambulation/Gait Ambulation/Gait assistance: Min assist, Contact guard assist, +2 safety/equipment Gait Distance (Feet): 170 Feet Assistive device: Rolling walker (2 wheels) Gait Pattern/deviations: Step-through pattern, Decreased stride length, Trunk flexed General Gait Details: Pt demonstrates slow gait with low foot clearance despite cues. Cues for upright posture and RW management Gait velocity:  decreased   ADL: ADL Overall ADL's : Needs assistance/impaired Eating/Feeding: Set up Eating/Feeding Details (indicate cue type and reason): assistance opening containers Grooming: Sitting, Wash/dry hands, Wash/dry face, Set up Grooming Details (indicate cue type and reason): stood at sink with min assist and required mod assist to complete dentures care Upper Body Bathing: Minimal assistance, Sitting Upper Body Bathing Details (indicate cue type and reason): at sink Lower Body Bathing: Moderate assistance, Sit to/from stand Lower Body Bathing Details (indicate cue type and reason): assistance with peri area bathing while standing Upper Body Dressing : Minimal assistance, Sitting Upper Body Dressing Details (indicate cue type and reason): gown for back Lower Body Dressing: Maximal assistance, Sitting/lateral leans Lower Body Dressing Details (indicate cue type and reason): changed socks with patient assisting with doffing Toilet Transfer: Minimal assistance, Rolling walker (2 wheels) Toilet Transfer Details (indicate cue type and reason): 2 person HHA Toileting-  Clothing Manipulation and Hygiene: Maximal assistance Functional mobility during ADLs: Minimal assistance, Moderate assistance General ADL Comments: focused on transfers and standing balance   Cognition: Cognition Overall Cognitive Status: Impaired/Different from baseline Arousal/Alertness: Awake/alert Orientation Level: Oriented to person, Oriented to place, Oriented to situation, Disoriented to time Attention: Sustained Sustained Attention: Impaired Sustained Attention Impairment: Verbal basic, Functional basic Memory: Impaired Memory Impairment: Decreased short term memory, Decreased recall of new information, Retrieval deficit Decreased Short Term Memory: Verbal basic, Functional basic Awareness: Appears intact Problem Solving: Appears intact Safety/Judgment: Appears intact Cognition Arousal: Alert Behavior During Therapy: Flat affect Overall Cognitive Status: Impaired/Different from baseline   Physical Exam: Blood pressure 118/79, pulse 98, temperature 98.3 F (36.8 C), temperature source Axillary, resp. rate 16, height 5' 7 (1.702 m), weight 49.1 kg, SpO2 98%. Physical Exam  Constitutional: No apparent distress. + Underweight.  HENT: No JVD. Neck Supple. Trachea midline. Atraumatic, normocephalic. Eyes: PERRLA. EOMI. Visual fields grossly intact. + Slight nystagmus on EOMI Cardiovascular: RRR, no murmurs/rub/gallops. No Edema. Peripheral pulses 2+  Respiratory: CTAB. No rales, rhonchi, or wheezing. On RA.  Occasional productive, yellow cough. Abdomen: + bowel sounds, normoactive. No distention or tenderness. + cortrak GU: Not examined. + Catheter, draining clear urine.  Skin: C/D/I. No apparent lesions.  Peripheral IVs intact.  MSK:      No apparent deformity.       Neurologic exam:  Cognition: AAO to person, place; oriented to time, states event as rash.  Lethargic, intermittently falling asleep on exam.  Follows all commands appropriately.  Language: Moderate to  severe dysarthria. Names 3/3 objects correctly.   Memory: Recalls 0/3 objects at 5 minutes.   Insight: Poor insight into current condition.  Mood: Flat affect Sensation: To light touch intact in BL UEs and LEs   Reflexes: 2+ in BL UE and LEs. Negative Hoffman's and babinski signs bilaterally.   CN: 2-12 grossly intact.   Coordination: No apparent tremors.  Mild left upper extremity ataxia. Spasticity: MAS 0 in all extremities.        Strength:                RUE: 5/5 SA, 5/5 EF, 5/5 EE, 5/5 WE, 5/5 FF, 5/5 FA                LUE:  4/5 SA, 4/5 EF, 4/5 EE, 4/5 WE, 4/5 FF, 4/5 FA                RLE: 5/5 HF, 5/5 KE, 5/5  DF, 5/5  EHL, 5/5  PF  LLE:  4/5 HF, 4/5 KE, 4/5  DF, 4/5  EHL, 4/5  PF       Lab Results Last 48 Hours        Results for orders placed or performed during the hospital encounter of 09/08/24 (from the past 48 hours)  Glucose, capillary     Status: Abnormal    Collection Time: 09/19/24  8:09 AM  Result Value Ref Range    Glucose-Capillary 152 (H) 70 - 99 mg/dL      Comment: Glucose reference range applies only to samples taken after fasting for at least 8 hours.  Glucose, capillary     Status: Abnormal    Collection Time: 09/19/24 11:59 AM  Result Value Ref Range    Glucose-Capillary 223 (H) 70 - 99 mg/dL      Comment: Glucose reference range applies only to samples taken after fasting for at least 8 hours.  Glucose, capillary     Status: Abnormal    Collection Time: 09/19/24 12:07 PM  Result Value Ref Range    Glucose-Capillary 237 (H) 70 - 99 mg/dL      Comment: Glucose reference range applies only to samples taken after fasting for at least 8 hours.    Comment 1 Notify RN      Comment 2 Document in Chart    Glucose, capillary     Status: Abnormal    Collection Time: 09/19/24  4:11 PM  Result Value Ref Range    Glucose-Capillary 221 (H) 70 - 99 mg/dL      Comment: Glucose reference range applies only to samples taken after fasting for at least 8  hours.    Comment 1 Notify RN      Comment 2 Document in Chart    Glucose, capillary     Status: Abnormal    Collection Time: 09/19/24  8:53 PM  Result Value Ref Range    Glucose-Capillary 167 (H) 70 - 99 mg/dL      Comment: Glucose reference range applies only to samples taken after fasting for at least 8 hours.  Glucose, capillary     Status: Abnormal    Collection Time: 09/19/24 11:54 PM  Result Value Ref Range    Glucose-Capillary 187 (H) 70 - 99 mg/dL      Comment: Glucose reference range applies only to samples taken after fasting for at least 8 hours.  Renal function panel     Status: Abnormal    Collection Time: 09/20/24  1:40 AM  Result Value Ref Range    Sodium 123 (L) 135 - 145 mmol/L    Potassium 3.2 (L) 3.5 - 5.1 mmol/L      Comment: HEMOLYSIS AT THIS LEVEL MAY AFFECT RESULT    Chloride 90 (L) 98 - 111 mmol/L    CO2 22 22 - 32 mmol/L    Glucose, Bld 183 (H) 70 - 99 mg/dL      Comment: Glucose reference range applies only to samples taken after fasting for at least 8 hours.    BUN 16 8 - 23 mg/dL    Creatinine, Ser 9.53 (L) 0.61 - 1.24 mg/dL    Calcium 7.0 (L) 8.9 - 10.3 mg/dL    Phosphorus <8.9 (LL) 2.5 - 4.6 mg/dL      Comment: CRITICAL RESULT CALLED TO, READ BACK BY AND VERIFIED WITH D MILLER, RN 09/20/24 0232 J ELGIN    Albumin 2.4 (L) 3.5 - 5.0 g/dL    GFR, Estimated >39 >39 mL/min  Comment: (NOTE) Calculated using the CKD-EPI Creatinine Equation (2021)      Anion gap 11 5 - 15      Comment: Performed at [facility], [address] [place], [place] [address]  Glucose, capillary     Status: Abnormal    Collection Time: 09/20/24  4:00 AM  Result Value Ref Range    Glucose-Capillary 188 (H) 70 - 99 mg/dL      Comment: Glucose reference range applies only to samples taken after fasting for at least 8 hours.  Glucose, capillary     Status: Abnormal    Collection Time: 09/20/24  7:38 AM  Result Value Ref Range    Glucose-Capillary 147 (H) 70 - 99  mg/dL      Comment: Glucose reference range applies only to samples taken after fasting for at least 8 hours.  Glucose, capillary     Status: Abnormal    Collection Time: 09/20/24 11:40 AM  Result Value Ref Range    Glucose-Capillary 177 (H) 70 - 99 mg/dL      Comment: Glucose reference range applies only to samples taken after fasting for at least 8 hours.  Glucose, capillary     Status: Abnormal    Collection Time: 09/20/24  4:18 PM  Result Value Ref Range    Glucose-Capillary 179 (H) 70 - 99 mg/dL      Comment: Glucose reference range applies only to samples taken after fasting for at least 8 hours.  Glucose, capillary     Status: Abnormal    Collection Time: 09/20/24  8:14 PM  Result Value Ref Range    Glucose-Capillary 147 (H) 70 - 99 mg/dL      Comment: Glucose reference range applies only to samples taken after fasting for at least 8 hours.  Glucose, capillary     Status: Abnormal    Collection Time: 09/21/24 12:40 AM  Result Value Ref Range    Glucose-Capillary 200 (H) 70 - 99 mg/dL      Comment: Glucose reference range applies only to samples taken after fasting for at least 8 hours.  Glucose, capillary     Status: Abnormal    Collection Time: 09/21/24  4:15 AM  Result Value Ref Range    Glucose-Capillary 191 (H) 70 - 99 mg/dL      Comment: Glucose reference range applies only to samples taken after fasting for at least 8 hours.      Imaging Results (Last 48 hours)  No results found.           Blood pressure 118/79, pulse 98, temperature 98.3 F (36.8 C), temperature source Axillary, resp. rate 16, height 5' 7 (1.702 m), weight 49.1 kg, SpO2 98%.   Medical Problem List and Plan: 1. 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  Antithrombotics: -DVT/anticoagulation:   Pharmaceutical: Lovenox initiated 09/09/2024             -antiplatelet therapy: N/A 3. Pain Management: Tylenol  as needed 4. Mood/Behavior/Sleep: Provide emotional support             -antipsychotic agents: N/A 5. Neuropsych/cognition: This patient is not capable of making decisions on his own behalf.  6. Skin/Wound Care: Routine skin checks 7. Fluids/Electrolytes/Nutrition: Routine in and outs with follow-up chemistries 8.  Chronic hyponatremia/SIADH as well as hypophosphatemia/hypokalemia..  Continue sodium chloride  tablets 2 g 3 times daily x 7 days as well as the addition of urea  15 mg twice daily x 6 doses and Lasix 20 mg daily.  - Nephrology following closely on inpatient; low threshold to reengage  9.  ID encephalitis/vasculitis.  ID recommending continue acyclovir x 14 days.  Patient also started on high-dose methylprednisolone by neurology 09/14/2024 with 5-day course completed. 10.  Acute urinary retention as well as history of prostate cancer status post TURP.  Patient with retaining urine 09/13/2024 bladder scan 900 cc.  Order was given for straight cath unsuccessful coud catheter placed.  Initiated Flomax.  Plan voiding trial once on rehab. 11.  AKI.  Resolved.  Follow-up chemistries 12.  GERD/duodenal ulceration, stricture and mass status post robotic gastrojejunostomy 8/25 at [facility].  Continue PPI 13.  Hypertension.  Norvasc 5 mg daily.  HCTZ at home currently on hold due to soft blood pressures.  Continue to monitor with increased mobility. 14.  Recent diagnosis of shingles September 2025.  Acyclovir as directed 15.  Hyperlipidemia.  Crestor 16.  Decreased nutritional storage/ileus.  Diet advanced to mechanical soft with tube feeds for nutritional support.   Dietary follow-up 17. Productive cough on TF.  Will get chest x-ray on unit admission to evaluate.   Toribio PARAS Angiulli, PA-C 09/21/2024  I have examined the patient independently and edited the note for HPI, ROS,  exam, assessment, and plan as appropriate. I am in agreement with the above recommendations.   Joesph JAYSON Likes, DO 09/21/2024

## 2024-09-21 NOTE — Progress Notes (Signed)
 Inpatient Rehab Admissions Coordinator:    I have a CIR bed for this Pt today. RN may call report to [phone].    Pt. To admit to CIR for estimated 7-10 days with the goal of dc home with his sister and other family.   Leita Kleine, MS, CCC-SLP Rehab Admissions Coordinator  [phone] (celll) [phone] (office)

## 2024-09-21 NOTE — TOC Transition Note (Signed)
 Transition of Care [facility]) - Discharge Note   Patient Details  Name: Noah Burnett MRN: [id number] Date of Birth: 04-22-1941  [facility] CM/SW Contact:  Andrez JULIANNA George, RN Phone Number: 09/21/2024, 10:54 AM   Clinical Narrative:     Pt is discharging to CIR today. CM signing off.  Final next level of care: IP Rehab Facility Barriers to Discharge: No Barriers Identified   Patient Goals and CMS Choice   CMS Medicare.gov Compare Post Acute Care list provided to:: Patient Represenative (must comment) Choice offered to / list presented to : Adult Children      Discharge Placement                       Discharge Plan and Services Additional resources added to the After Visit Summary for     Discharge Planning Services: CM Consult Post Acute Care Choice: IP Rehab                               Social Drivers of Health (SDOH) Interventions SDOH Screenings   Food Insecurity: No Food Insecurity (09/08/2024)  Housing: Low Risk  (09/08/2024)  Transportation Needs: No Transportation Needs (09/08/2024)  Utilities: Not At Risk (09/08/2024)  Social Connections: Unknown (09/08/2024)  Tobacco Use: Medium Risk (09/08/2024)     Readmission Risk Interventions     No data to display

## 2024-09-21 NOTE — Progress Notes (Signed)
 Pt arrived on the unit

## 2024-09-21 NOTE — Progress Notes (Signed)
 Inpatient Rehabilitation Admission Medication Review by a Pharmacist   A complete drug regimen review was completed for this patient to identify any potential clinically significant medication issues.   High Risk Drug Classes Is patient taking? Indication by Medication  Antipsychotic No    Anticoagulant Yes Lovenox - DVT px  Antibiotic Yes Acyclovir - VZ encephalitis  Opioid No    Antiplatelet No    Hypoglycemics/insulin No    Vasoactive Medication Yes Norvasc - HTN  Chemotherapy No    Other Yes senna  and - constipation Loperamide - diarrhea Pantoprazole - reflux  Acetaminophen - pain  Zofran - n/v   MVI, KCL, B12, Nacl, Kphos - supplements Crestor - HLD Flomax - BPH Lasix - BP, edema          Type of Medication Issue Identified Description of Issue Recommendation(s)  Drug Interaction(s) (clinically significant)        Duplicate Therapy        Allergy        No Medication Administration End Date        Incorrect Dose        Additional Drug Therapy Needed        Significant med changes from prior encounter (inform family/care partners about these prior to discharge). Medication PTA not  resumed: Megace.     Hydrochlorothiazide on hold per hospitalist Communicate relevant medication changes to patient/family members at discharge from CIR.    Restart or discontinue PTA meds not resumed in CIR at discharge if clinically indicated.   Other            Clinically significant medication issues were identified that warrant physician communication and completion of prescribed/recommended actions by midnight of the next day:  No     Time spent performing this drug regimen review (minutes):  30  Clementina Mareno BS, PharmD, BCPS Clinical Pharmacist 09/21/2024 7:22 PM  Contact: [phone] after 3 PM

## 2024-09-21 NOTE — Discharge Summary (Signed)
 Physician Discharge Summary  Noah Burnett DOB: 1940-11-10 DOA: 09/08/2024  PCP: Noah Norleen PEDLAR, MD  Admit date: 09/08/2024 Discharge date: 09/21/2024 30 Day Unplanned Readmission Risk Score    Flowsheet Row ED to Hosp-Admission (Current) from 09/08/2024 in [facility] [facility]  30 Day Unplanned Readmission Risk Score (%) 25.78 Filed at 09/21/2024 0801    This score is the patient's risk of an unplanned readmission within 30 days of being discharged (0 -100%). The score is based on dignosis, age, lab data, medications, orders, and past utilization.   Low:  0-14.9   Medium: 15-21.9   High: 22-29.9   Extreme: 30 and above          Admitted From: Home Disposition: [facility]  Recommendations for Outpatient Follow-up:  Follow up with PCP in 1-2 weeks Please obtain BMP/CBC in one week Follow-up with nephrology in 1 week after discharge from [facility] Follow-up with neurology in 1 to 2 weeks after discharge from [facility]. Please do voiding trial once he is more active at [facility].  If fails, he will need to [facility] for [facility]ing [facility]. Please follow up with your PCP on the following pending results: Unresulted Labs (From admission, onward)     Start     Ordered   09/15/24 0500  Renal function panel  Daily,   R     Question:  Specimen collection method  Answer:  Lab=Lab collect   09/14/24 1634   09/09/24 1820  Vitamin B1  Once,   R        09/09/24 1820   Signed and Held  Creatinine, serum  (enoxaparin (LOVENOX)    CrCl >/= 30 ml/min)  Once,   R       Comments: Baseline for enoxaparin therapy IF NOT ALREADY DRAWN.   Question:  Specimen collection method  Answer:  Lab=Lab collect   Signed and Held   Signed and Held  Creatinine, serum  (enoxaparin (LOVENOX)    CrCl >/= 30 ml/min)  Weekly,   R     Comments: while on enoxaparin therapy   Question:  Specimen collection method  Answer:  Lab=Lab collect   Signed and Held   Signed and Held  Comprehensive  metabolic panel  Tomorrow morning,   R       Question:  Specimen collection method  Answer:  Lab=Lab collect   Signed and Held   Signed and Held  CBC with Differential/Platelet  Tomorrow morning,   R       Question:  Specimen collection method  Answer:  Lab=Lab collect   Signed and Held              Home Health: None Equipment/Devices: None  Discharge Condition: Stable CODE STATUS: Full code Diet recommendation:  Diet Order             DIET DYS 3 Room service appropriate? Yes; Fluid consistency: Thin  Diet effective now                   Subjective: Seen and examined, sister-in-law at the bedside.  Patient has no complaints.  Remains stable, alert and oriented.  Brief/Interim Summary: 83 year old male with a history of prostate cancer (on observation), tobacco abuse, hypertension, GERD, duodenal mass resulting in gastric L obstruction status post robotic gastrojejunostomy 07/03/2024 presenting with decreased oral intake, generalized weakness, and confusion.   Patient had hospital admission at [facility] from 8/18 to 07/09/24 when he presented with nausea  and vomiting.  He was found to have gastric outlet obstruction and duodenal mass. He had an EGD that showed an hiatal hernia and compression of the second portion of the duodenum with a possible intraluminal neoplasm. Biopsies were obtained which showed severely inflamed and ulcerated duodenal mucosa, but no evidence of malignancy. A second EGD was done with ultrasound guidance, but again pathology of the lesion extensive necrosis. Surgical oncology was consulted and he underwent a robotic gastrojejunostomy on 8/25. He did have a delayed return of bowel function and had a PICC line with TPN for several days until he had advancement of his diet. Since his hospitalization, he has had a fairly steady functional decline with decreasing appetite, significant weight loss, and generalized weakness.  After discharge, the patient was  tolerating a diet and still driving and walking independently.   Unfortunately, the patient developed shingles in the last week of September 2025.  He went to see ophthalmology, and he was cleared from their standpoint.  He finished a course of Valtrex.  He continues to have some postherpetic neuralgia.  Daughter states that since his shingles episode, the patient has had a functional decline with decreasing appetite, significant weight loss, and generalized weakness.   The patient has followed up with surgical oncology on 08/31/2024.  At that time the patient had reported generalized weakness and fatigue, but he did not have any vomiting or abdominal pain at that time.  He had poor p.o. intake and loss of appetite at that time.  Repeat MRI of the abdomen and pelvis performed on 09/06/2024 which showed no specific MR findings to suggest a duodenal mass.  There was Generalized edema-like signal in the retroperitoneum is presumed reactive in the setting of known duodenal ulceration.  There were multiple benign appearing hepatic lesions presumed to be cysts or hemangioma.   Daughter states that in the past week he has had increasing confusion and generalized weakness with continued decreased oral intake but no other specific symptoms. UA was negative for pyuria.  CT of the brain was negative for acute findings.  Acute abdominal series was negative for obstruction or pneumoperitoneum.   Neurology consulted for evaluation of acute encephalopathy. His hospital course complicated by acute on chronic hyponatremia. Nephrology consulted and he was started on salt tablets.  Further details as below.   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  Differential include encephalitis  versus vasculitis versus demyelinating disease vs stroke. Neurology consulted and patient was transferred from [facility] to [facility]. Patient underwent fluoroscopic guided lumbar puncture and fluid sent for analysis for evaluation of meningitis and encephalitis. Procalcitonin is negative, CRP is 4.1. EEG showed diffuse cerebral dysfunction, no epileptiform discharges seen throughout the recording. VZVPCR is positive.  Varicella IgG negative.  Other workup negative result.   Early morning of 09/13/2024, patient noted to have left gaze preference and decreased responsiveness , code stroke was called, patient was seen by neurology.  Stat CT head was done which shows left superior convexity subarachnoid hemorrhage without mass effect.  Otherwise no changes from previous CT head.  CT angiogram ruled out large vessel occlusion.  Patient was kept n.p.o., SLP consulted.  Eventually MRI completed which showed Prominent patchy cortical and subcortical signal abnormality throughout both cerebral hemispheres, overall slightly progressed from 09/09/2024 and indeterminate although encephalitis and vasculitis remain  leading considerations.  ID recommended continuing acyclovir.  Patient's mental status improved significantly on 09/14/2024.  ID and neuro both suspecting varicella encephalitis as likely etiology of patient's symptoms and recommended 14 days of total acyclovir.  Patient was also started on high-dose methylprednisolone by neurology 09/14/2024 with the plan to continue this for 5 days followed by taper of prednisone.  Currently on 50 mg prednisone daily.  Per my discussion with Dr. Michaela, he does not recommend continuing prednisone anymore.  Prednisone is stopped 09/20/2024.  He will need to follow-up with neurology in 1 to 2 weeks after discharge from [facility].   Chronic hyponatremia: Per nephrology, possibly from SIADH. Sodium in August, 2025 was 129.  Patient was admitted with a sodium of 126 on 10/21.TSH  slightly elevated. Am cortisol 30. Serum osmo is 257, urine sodium is 137, urine osmolality is 490.  And on salt tablets 1 g 3 times daily.  Nephrology signed off.  Patient's sodium dropped to 124.  On 09/17/2024, discussed with Dr. Geralynn of nephrology and since patient needs some IV fluids to prevent AKI while he is on IV acyclovir, nephrology okayed to continue at 50 cc/h and an creased salt tablets to 2 g 3 times daily.  Sodium dropped to 123 but slightly improved to 124 today.  Nephrology has been giving him IV Lasix.  Nephrology is recommending to discharge on current dose of sodium bicarb, urea  as well as Lasix 20 mg p.o. daily.  Recommend checking sodium daily and reaching out to nephrology if it drops further.  Also recommend reaching out to nephrology when to stop those medications.    Acute urinary retention: On the evening of 09/13/2024, patient was retaining urine, bladder scan showed 900 cc.  Orders were given to the nurses for straight cath however that was unsuccessful so coud catheter was placed.  Started on Flomax.   Will need to do a voiding trial once he is more active with physical therapy at CIR. if fails, will need referral to [facility]-up with [facility] outpatient for voiding trial.   AKI: Creatinine jumped from 0.76 to 1.66 to 2.07 09/14/2024, nephrology was on board, patient given IV fluids, AKI was presumed to be secondary to obstructive uropathy as well as contrast induced.  AKI resolved 09/15/2024.   GERD/ Duodenal ulceration, stricture and mass s/p  robotic gastrojejunostomy on 8/25. Continue with PPI   Nausea vomiting/abdominal pain/ileus: Patient started complaining of abdominal pain and nausea and vomiting on the morning of 09/17/2024, patient had no tenderness and he was having bowel movements as well.  Abdominal x-ray showed ileus although clinically it did not appear so.  Tube feeds were stopped.  Patient looked and felt better 09/18/2024.  Repeat x-ray did not mention  ileus.  Tube feeds resumed.  He is doing well without any symptoms and tolerating tube feeds.   Hypertension: On hydrochlorothiazide at home which is on hold, blood pressure controlled.   Hypokalemia: Low again, being replenished.  Recommend checking every day and replenishing as needed at CIR.   Hypophosphatemia: Very low yesterday, replenished, improving.  I have ordered few more phosphate replacement today.  Recommend rechecking tomorrow morning.   Hypomagnesemia: Resolved.   Recently diagnosed Shingles On Cyclivert.   GOC: Patient remains full code.  On 09/13/2024, I discussed with the son, he wants the patient to be full code however he told me that the siblings are having detailed conversation about the CODE STATUS and this might change.  Palliative is also following.  Discharge plan was discussed with patient and/or family member and they verbalized understanding and agreed with it.  Discharge Diagnoses:  Principal Problem:   Failure to thrive in adult Active Problems:   Essential hypertension   Gastric outlet obstruction   H/O bypass gastrojejunostomy   Hyponatremia   Prediabetes   Protein-calorie malnutrition, severe    Discharge Instructions  Discharge Instructions     [facility] pharmacist to adjust dose for Vancomycin, Aminoglycosides and other anti-infective therapies as requested by physician.   Complete by: As directed    [facility] to provide Cath Flo 2mg    Complete by: As directed    Administer for PICC line occlusion and as ordered by physician for other access device issues.   Anaphylaxis Kit: Provided to treat any anaphylactic reaction to the medication being provided to the patient if First Dose or when requested by physician   Complete by: As directed    Epinephrine 1mg /ml vial / amp: Administer 0.3mg  (0.9ml) subcutaneously once for moderate to severe anaphylaxis, nurse to call physician and pharmacy when reaction occurs and call  [phone] if needed for immediate care   Diphenhydramine 50mg /ml IV vial: Administer 25-50mg  IV/IM PRN for first dose reaction, rash, itching, mild reaction, nurse to call physician and pharmacy when reaction occurs   Sodium Chloride  0.9% NS 500ml IV: Administer if needed for hypovolemic blood pressure drop or as ordered by physician after call to physician with anaphylactic reaction   Change dressing on IV access line weekly and PRN   Complete by: As directed    Flush IV access with Sodium Chloride  0.9% and Heparin 10 units/ml or 100 units/ml   Complete by: As directed    Home infusion instructions - [facility]   Complete by: As directed    Instructions: Flush IV access with Sodium Chloride  0.9% and Heparin 10units/ml or 100units/ml   Change dressing on IV access line: Weekly and PRN   Instructions Cath Flo 2mg : Administer for PICC Line occlusion and as ordered by physician for other access device   [facility] pharmacist to adjust dose for: Vancomycin, Aminoglycosides and other anti-infective therapies as requested by physician   Method of administration may be changed at the discretion of home infusion pharmacist based upon assessment of the patient and/or caregiver's ability to self-administer the medication ordered   Complete by: As directed       Allergies as of 09/21/2024       Reactions   Haemophilus Influenzae Vaccines Other (See Comments)   I get deathly sick        Medication List     STOP taking these medications    hydrochlorothiazide 12.5 MG tablet Commonly known as: HYDRODIURIL       TAKE these medications    acetaminophen  500 MG tablet Commonly known as: TYLENOL  Take 1,000 mg by mouth every 6 (six) hours as needed for headache or mild pain (pain score 1-3).   acyclovir 455 mg in dextrose  5 % 100 mL Inject 455 mg into the vein every 12 (twelve) hours for 3 doses.   amLODipine 5 MG tablet Commonly known as: NORVASC Take 1 tablet (5 mg  total) by mouth daily. Start taking on: September 22, 2024   docusate sodium 100 MG capsule Commonly known as: COLACE Take 100 mg by mouth 2 (two) times daily.   furosemide 20 MG tablet Commonly known as: Lasix Take 1 tablet (20 mg total) by mouth daily.   megestrol 40  MG/ML suspension Commonly known as: MEGACE Take 400 mg by mouth every morning.   omeprazole 40 MG capsule Commonly known as: PRILOSEC Take 40 mg by mouth daily.   polyethylene glycol 17 g packet Commonly known as: MIRALAX / GLYCOLAX Take 17 g by mouth daily.   rosuvastatin 10 MG tablet Commonly known as: CRESTOR Take 1 tablet (10 mg total) by mouth daily. Start taking on: September 22, 2024   sodium chloride  1 g tablet Take 2 tablets (2 g total) by mouth 3 (three) times daily with meals for 7 days.   tamsulosin 0.4 MG Caps capsule Commonly known as: FLOMAX Take 1 capsule (0.4 mg total) by mouth daily after supper.   urea  15 g Pack oral packet Commonly known as: URE-NA Take 15 g by mouth 2 (two) times daily for 3 days.               Discharge Care Instructions  (From admission, onward)           Start     Ordered   09/21/24 0000  Change dressing on IV access line weekly and PRN  (Home infusion instructions - [facility] )        09/21/24 1018            Follow-up Information     Noah Norleen PEDLAR, MD Follow up in 1 week(s).   Specialty: Internal Medicine Contact information: [address] [place] [address] [phone]                Allergies  Allergen Reactions   Haemophilus Influenzae Vaccines Other (See Comments)    I get deathly sick    Consultations: Nephrology, neurology, ID, palliative care   Procedures/Studies: DG Abd 1 View Result Date: 09/18/2024 CLINICAL DATA:  Ileus. EXAM: ABDOMEN - 1 VIEW COMPARISON:  Abdominal radiograph dated 09/17/2024. FINDINGS: Enteric tube with tip projecting over the lower abdomen, likely in the duodenum. No  bowel dilatation or evidence of obstruction. No free air or radiopaque calculi. Osteopenia with degenerative changes of spine. No acute osseous pathology. IMPRESSION: Enteric tube with tip projecting over the lower abdomen, likely in the duodenum. Electronically Signed   By: Vanetta Chou M.D.   On: 09/18/2024 11:19   DG Abd 1 View Result Date: 09/17/2024 CLINICAL DATA:  Small bowel obstruction. EXAM: DG ABDOMEN 1V COMPARISON:  Lymph 725. FINDINGS: Gaseous prominence of small bowel and colon. Gas in the rectum. Feeding tube terminates in the distal stomach. IMPRESSION: Ileus. Electronically Signed   By: Newell Eke M.D.   On: 09/17/2024 15:48   US  EKG SITE RITE Result Date: 09/16/2024 If Site Rite image not attached, placement could not be confirmed due to current cardiac rhythm.  DG Abd Portable 1V Result Date: 09/15/2024 EXAM: 1 VIEW XRAY OF THE ABDOMEN 09/15/2024 11:40:00 AM COMPARISON: 7 days ago. Feeding tube tip is seen in proximal stomach. CLINICAL HISTORY: Encounter for feeding tube placement. FINDINGS: LINES, TUBES AND DEVICES: Feeding tube tip is seen in proximal stomach. BOWEL: Nonobstructive bowel gas pattern. SOFT TISSUES: No opaque urinary calculi. BONES: No acute osseous abnormality. IMPRESSION: 1. Feeding tube tip in the proximal stomach. Electronically signed by: Lynwood Seip MD 09/15/2024 12:47 PM EST RP Workstation: [id number]   MR BRAIN WO CONTRAST Result Date: 09/13/2024 EXAM: MRI BRAIN WITHOUT CONTRAST 09/13/2024 04:16:00 PM TECHNIQUE: Multiplanar multisequence MRI of the head/brain was performed without the administration of intravenous contrast. COMPARISON: Head CT and CTA 09/13/2024 and MRI 09/09/2024.  CLINICAL HISTORY: Neuro deficit, acute, stroke suspected. FINDINGS: The examination is intermittently up to moderately motion degraded. BRAIN AND VENTRICLES: Numerous patchy areas of cortical and subcortical T2 hyperintensity and diffusion weighted signal hyperintensity are  again seen scattered throughout both cerebral hemispheres. Some areas appear mildly progressive on diffusion weighted imaging, such as small regions of restricted diffusion involving cortex and subcortical white matter in the posterior left frontal lobe and left corona radiata, while the signal changes on T2 and FLAIR sequences are stable to at most minimally increased. Gyral intrinsic T1 hyperintensity is again noted along the right parietooccipital sulcus. A small amount of susceptibility within the left postcentral sulcus corresponds to subarachnoid hemorrhage which is new from the prior MRI but was seen on today's earlier CT. There is mild to moderate cerebral atrophy. Background chronic small vessel ischemia is noted in the cerebral white matter. No midline shift, hydrocephalus, or extra axial fluid collection is identified. Major intracranial vascular flow voids are preserved. ORBITS: Bilateral cataract extraction. SINUSES AND MASTOIDS: No acute abnormality. BONES AND SOFT TISSUES: Normal marrow signal. No acute soft tissue abnormality. IMPRESSION: 1. Prominent patchy cortical and subcortical signal abnormality throughout both cerebral hemispheres, overall slightly progressed from 09/09/2024 and indeterminate although encephalitis and vasculitis remain leading considerations. 2. Small volume left parietal subarachnoid hemorrhage. Electronically signed by: Dasie Hamburg MD 09/13/2024 04:35 PM EST RP Workstation: [id number]   EEG adult Result Date: 09/13/2024 Shelton Arlin KIDD, MD     09/13/2024  2:58 PM Patient Name: JEORGE REISTER MRN: [id number] Epilepsy Attending: Arlin KIDD Shelton Referring Physician/Provider: Remi Pippin, NP Date: 09/13/2024 Duration: 24.05 mins Patient history: 83 year old male with altered mental status.  EEG evaluate for seizure. Level of alertness: Awake AEDs during EEG study: None Technical aspects: This EEG study was done with scalp electrodes positioned according to the 10-20  International system of electrode placement. Electrical activity was reviewed with band pass filter of 1-70Hz , sensitivity of 7 uV/mm, display speed of 67mm/sec with a 60Hz  notched filter applied as appropriate. EEG data were recorded continuously and digitally stored.  Video monitoring was available and reviewed as appropriate. Description: EEG showed continuous generalized 3 to 6 Hz theta-delta slowing admixed with 12-15hz  beta activity. Hyperventilation and photic stimulation were not performed.   ABNORMALITY - Continuous slow, generalized IMPRESSION: This study is suggestive of generalized cerebral dysfunction (encephalopathy). No seizures or epileptiform discharges were seen throughout the recording. Priyanka O Yadav   CT ANGIO HEAD NECK W WO CM (CODE STROKE) Result Date: 09/13/2024 EXAM: CTA HEAD AND NECK WITHOUT AND WITH 09/13/2024 10:29:28 AM TECHNIQUE: CTA of the head and neck was performed without and with the administration of 75 mL of iohexol  (OMNIPAQUE ) 350 MG/ML injection. Multiplanar 2D and/or 3D reformatted images are provided for review. Automated exposure control, iterative reconstruction, and/or weight based adjustment of the mA/kV was utilized to reduce the radiation dose to as low as reasonably achievable. Stenosis of the internal carotid arteries measured using NASCET criteria. COMPARISON: Code stroke head CT 09/13/2024 (reported separately), recent CTA head and neck 09/10/2024. CLINICAL HISTORY: 83 year old male with acute neuro deficit, stroke suspected. FINDINGS: CTA NECK: AORTIC ARCH AND ARCH VESSELS: Tortuous aortic arch with mild forage calcified atherosclerosis. 3 vessel arch configuration. No dissection or arterial injury. No significant stenosis of the brachiocephalic or subclavian arteries. Proximal subclavian arteries are stable with tortuosity, no stenosis. CERVICAL CAROTID ARTERIES: Right carotid artery remains patent to the skull base with stable atherosclerosis at the right  ICA origin  less than 50% stenosis. Left carotid artery remains patent to the skull base with stable atherosclerosis at the left ICA origin and bulb, narrowing the vessel to 1.9 mm versus 5 mm normally, resulting in 62% stenosis. No dissection or arterial injury. CERVICAL VERTEBRAL ARTERIES: Dominant left vertebral artery is patent and normal to the skull base. Nondominant right vertebral artery is patent, with stable mild stenosis at its origin. Stable mild irregularity and stenosis of the dominant left vertebral artery as it crosses the dura, mild left V4 irregularity. Patent left PICA origin. The left vertebral primarily supplies the basilar artery as before. Distal right vertebral artery is stable with mild irregularity and stenosis, patent right PICA origin. No dissection or arterial injury. LUNGS AND MEDIASTINUM: Centrilobular emphysema. Negative visible superior mediastinum. SOFT TISSUES: Negative nonvascular neck soft tissue spaces. No acute abnormality. BONES: Absent dentition. Exaggerated cervical lordosis and upper thoracic kyphosis. No acute abnormality. CTA HEAD: ANTERIOR CIRCULATION: Bilateral ICA siphons remain patent. Left siphon moderate calcified plaque and small supraclinoid left ICA 2 to 3 mm aneurysm (series 15 image 109) are stable. Only mild left siphon stenosis. Right ICA siphon moderate calcified plaque and mild to moderate supraclinoid right ICA stenosis are stable. MCA and ACA origins remain patent. Nondominant left A1 segment redemonstrated. Dominant left ACA A2 segment redemonstrated. Bilateral ACA branches are stable. Left MCA M1 segment and bifurcation remain patent without stenosis. Right MCA M1 segment and bifurcation remain patent without stenosis. Bilateral MCA branches are stable. No other aneurysm identified in the anterior circulation. POSTERIOR CIRCULATION: Basilar artery remains patent. SCA and PCA origins are patent. Bilateral PCA branches remain patent, mild to moderate left  P2 segment irregularity does not appear significantly changed. No other aneurysm identified in the posterior circulation. OTHER: Early venous contrast timing on this exam, major dural venous sinuses appear grossly patent. Preliminary Report of this exam discussed by telephone with Neurology provider [facility] at 1035 hours. IMPRESSION: 1. Stable CTA head and neck since 09/10/24 with no large vessel occlusion. 2. Stable atherosclerosis at the left ICA origin and bulb with 62% stenosis. 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 And stable less pronounced atherosclerosis elsewhere. 3. Emphysema. Electronically signed by: Helayne Hurst MD 09/13/2024 10:45 AM EST RP Workstation: [id number]   CT HEAD CODE STROKE WO CONTRAST Result Date: 09/13/2024 EXAM: CT HEAD WITHOUT CONTRAST 09/13/2024 10:14:31 AM TECHNIQUE: CT of the head was performed without the administration of intravenous contrast. Automated exposure control, iterative reconstruction, and/or weight based adjustment of the mA/kV was utilized to reduce the radiation dose to as low as reasonably achievable. COMPARISON: Brain MRI 09/09/2024, Head CT 09/10/2024. CLINICAL HISTORY: 83 year old male with acute neuro deficit, stroke suspected. FINDINGS: BRAIN AND VENTRICLES: Trace new left superior convexity subarachnoid blood, 1 to 2 sulci affected and best seen on series 6 image 39 sagittal images. No progressive edema there, no mass effect. No other acute intracranial hemorrhage. No convincing cortically based infarct by CT. No hydrocephalus. No extra-axial collection. No mass effect or midline shift. Stable brain volume. Patchy and confluent, mild to moderate periventricular white matter hypodensity has not significantly changed. Asymmetric right superior temporal gyrus white matter hypodensity also stable to recent exams. Calcified atherosclerosis. No suspicious intracranial vascular hyperdensity. ORBITS: No acute abnormality. Mild leftward gaze. SINUSES:  Paranasal sinuses, middle ears and mastoids remain well aerated. SOFT TISSUES AND SKULL: No acute soft tissue abnormality. No skull fracture. [place] stroke program early CT score (aspects) ----- Ganglionic (caudate, ic, lentiform nucleus,  insula, M1-m3): 7 Supraganglionic (m4-m6): 3 Total: 10 IMPRESSION: 1. Trace new left superior convexity subarachnoid hemorrhage (12 sulci), without mass effect. 2. Otherwise stable non contrast CT appearance of the brain: Nonspecific white matter changes, No convincing cortically based infarct by CT. ASPECTS 10. 3. These results were communicated to Dr. Lindzen at 10:23 hours on 09/13/2024 by text page via the [facility] messaging system. Electronically signed by: Helayne Hurst MD 09/13/2024 10:26 AM EST RP Workstation: [id number]   ECHOCARDIOGRAM COMPLETE Result Date: 09/10/2024    ECHOCARDIOGRAM REPORT   Patient Name:   MALCOLM QUAST Date of Exam: 09/10/2024 Medical Rec #:  [id number]        Height:       67.0 in Accession #:    [id number]       Weight:       100.5 lb Date of Birth:  1941/06/05        BSA:          1.510 m Patient Age:    83 years         BP:           112/57 mmHg Patient Gender: M                HR:           59 bpm. Exam Location:  Inpatient Procedure: 2D Echo, 3D Echo, Cardiac Doppler, Color Doppler and Strain Analysis            (Both Spectral and Color Flow Doppler were utilized during            procedure). Indications:    Stroke  History:        Patient has no prior history of Echocardiogram examinations.                 Risk Factors:Hypertension.  Sonographer:    Philomena Daring Referring Phys: [id number] ASHISH ARORA  Sonographer Comments: Global longitudinal strain was attempted. IMPRESSIONS  1. Left ventricular ejection fraction, by estimation, is 55%. Left ventricular ejection fraction by 3D volume is 52 %. The left ventricle has normal function. The left ventricle has no regional wall motion abnormalities. Left ventricular diastolic parameters are consistent  with Grade I diastolic dysfunction (impaired relaxation). The average left ventricular global longitudinal strain is -13.3 %.  2. Right ventricular systolic function is normal. The right ventricular size is normal. Tricuspid regurgitation signal is inadequate for assessing PA pressure.  3. The mitral valve is normal in structure. Trivial mitral valve regurgitation. No evidence of mitral stenosis.  4. The aortic valve is tricuspid. Aortic valve regurgitation is mild. No aortic stenosis is present.  5. The inferior vena cava is normal in size with greater than 50% respiratory variability, suggesting right atrial pressure of 3 mmHg. Comparison(s): No prior Echocardiogram. FINDINGS  Left Ventricle: Left ventricular ejection fraction, by estimation, is 55%. Left ventricular ejection fraction by 3D volume is 52 %. The left ventricle has normal function. The left ventricle has no regional wall motion abnormalities. The average left ventricular global longitudinal strain is -13.3 %. Strain was performed and the global longitudinal strain is indeterminate. The left ventricular internal cavity size was normal in size. There is no left ventricular hypertrophy. Left ventricular diastolic parameters are consistent with Grade I diastolic dysfunction (impaired relaxation). Normal left ventricular filling pressure. Right Ventricle: The right ventricular size is normal. No increase in right ventricular wall thickness. Right ventricular systolic function is normal. Tricuspid regurgitation signal is inadequate for  assessing PA pressure. Left Atrium: Left atrial size was normal in size. Right Atrium: Right atrial size was normal in size. Pericardium: There is no evidence of pericardial effusion. Mitral Valve: The mitral valve is normal in structure. Trivial mitral valve regurgitation. No evidence of mitral valve stenosis. Tricuspid Valve: The tricuspid valve is normal in structure. Tricuspid valve regurgitation is not demonstrated. No  evidence of tricuspid stenosis. Aortic Valve: The aortic valve is tricuspid. Aortic valve regurgitation is mild. No aortic stenosis is present. Pulmonic Valve: The pulmonic valve was not well visualized. Pulmonic valve regurgitation is not visualized. No evidence of pulmonic stenosis. Aorta: The aortic root and ascending aorta are structurally normal, with no evidence of dilitation. Venous: The inferior vena cava is normal in size with greater than 50% respiratory variability, suggesting right atrial pressure of 3 mmHg. IAS/Shunts: No atrial level shunt detected by color flow Doppler. Additional Comments: 3D was performed not requiring image post processing on an independent workstation and was abnormal.  LEFT VENTRICLE PLAX 2D LVIDd:         4.00 cm         Diastology LVIDs:         2.80 cm         LV e' medial:  4.57 cm/s LV PW:         1.10 cm         LV e' lateral: 7.94 cm/s LV IVS:        1.10 cm LVOT diam:     2.00 cm         2D Longitudinal LV SV:         59              Strain LV SV Index:   39              2D Strain GLS   -13.3 % LVOT Area:     3.14 cm        Avg:                                 3D Volume EF                                LV 3D EF:    Left                                             ventricul                                             ar                                             ejection                                             fraction  by 3D                                             volume is                                             52 %.                                 3D Volume EF:                                3D EF:        52 %                                LV EDV:       84 ml                                LV ESV:       40 ml                                LV SV:        43 ml RIGHT VENTRICLE             IVC RV Basal diam:  2.80 cm     IVC diam: 1.20 cm RV Mid diam:    2.50 cm RV S prime:     12.50 cm/s TAPSE (M-mode): 1.6 cm  LEFT ATRIUM             Index        RIGHT ATRIUM          Index LA diam:        3.40 cm 2.25 cm/m   RA Area:     9.48 cm LA Vol (A2C):   35.1 ml 23.25 ml/m  RA Volume:   14.90 ml 9.87 ml/m LA Vol (A4C):   37.9 ml 25.11 ml/m LA Biplane Vol: 38.1 ml 25.24 ml/m  AORTIC VALVE LVOT Vmax:   95.40 cm/s LVOT Vmean:  61.300 cm/s LVOT VTI:    0.188 m  AORTA Ao Root diam: 3.20 cm Ao Asc diam:  3.00 cm MITRAL VALVE MV Area (PHT): 2.48 cm    SHUNTS MV Decel Time: 306 msec    Systemic VTI:  0.19 m MV A velocity: 84.40 cm/s  Systemic Diam: 2.00 cm Vishnu Priya Mallipeddi Electronically signed by Diannah Late Mallipeddi Signature Date/Time: 09/10/2024/4:39:28 PM    Final    DG FL GUIDED LUMBAR PUNCTURE Result Date: 09/10/2024 CLINICAL DATA:  83 year old male with a history of progressively worsening confusion, lethargy, and decreasing oral intake. Noticed to have worsening generalized declined following shingles outbreak. Request for lumbar puncture to rule out HSV encephalitis. EXAM: LUMBAR PUNCTURE UNDER FLUOROSCOPY PROCEDURE: An appropriate skin entry site was determined fluoroscopically. Operator donned sterile gloves and mask. Skin site was marked, then prepped with Betadine, draped in usual sterile  fashion, and infiltrated locally with 1% lidocaine . A 20 gauge spinal needle advanced into the thecal sac at L5-S1 from a right interlaminar approach. Clear colorless CSF spontaneously returned, with opening pressure of 13 cm water. 15 ml CSF were collected and divided among 4 sterile vials for the requested laboratory studies. The needle was then removed. The patient tolerated the procedure well and there were no complications. FLUOROSCOPY: Radiation Exposure Index (as provided by the fluoroscopic device): 2.7 mGy Kerma IMPRESSION: Technically successful lumbar puncture under fluoroscopy. This exam was performed by [facility] PA-C, and was supervised and interpreted by Dr. Ree Molt. Electronically Signed   By:  Ree Molt M.D.   On: 09/10/2024 14:00   EEG adult Result Date: 09/10/2024 Shelton Arlin KIDD, MD     09/10/2024  7:23 AM Patient Name: OBBIE LEWALLEN MRN: [id number] Epilepsy Attending: Arlin KIDD Shelton Referring Physician/Provider: Voncile Isles, MD Date: 09/10/2024 Duration: 25.59 mins Patient history: 83yo M with ams. EEG to evaluate for seizure Level of alertness: Awake, asleep AEDs during EEG study: None Technical aspects: This EEG study was done with scalp electrodes positioned according to the 10-20 International system of electrode placement. Electrical activity was reviewed with band pass filter of 1-70Hz , sensitivity of 7 uV/mm, display speed of 58mm/sec with a 60Hz  notched filter applied as appropriate. EEG data were recorded continuously and digitally stored.  Video monitoring was available and reviewed as appropriate. Description: The posterior dominant rhythm consists of 8-9Hz  activity of moderate voltage (25-35 uV) seen predominantly in posterior head regions, symmetric and reactive to eye opening and eye closing. Sleep was characterized by vertex waves, sleep spindles (12 to 14 Hz), maximal frontocentral region. There is intermittent generalized 3 to 6 Hz theta-delta slowing. Hyperventilation and photic stimulation were not performed.   ABNORMALITY - Intermittent slow, generalized IMPRESSION: This study is suggestive of diffuse cerebral dysfunction (encephalopathy). No seizures or epileptiform discharges were seen throughout the recording. Priyanka KIDD Shelton   CT ANGIO HEAD NECK W WO CM Result Date: 09/10/2024 EXAM: CTA Head and Neck with Intravenous Contrast. CT Head without Contrast. CLINICAL HISTORY: Neuro deficit, acute, stroke suspected. TECHNIQUE: Axial CTA images of the head and neck performed with intravenous contrast. MIP reconstructed images were created and reviewed. Axial computed tomography images of the head/brain performed without intravenous contrast. Note: Per PQRS, the  description of internal carotid artery percent stenosis, including 0 percent or normal exam, is based on North American Symptomatic Carotid Endarterectomy Trial (NASCET) criteria. Dose reduction technique was used including one or more of the following: automated exposure control, adjustment of mA and kV according to patient size, and/or iterative reconstruction. CONTRAST: Without and with; 75 mL (iohexol  (OMNIPAQUE ) 350 MG/ML injection 75 mL IOHEXOL  350 MG/ML SOLN). COMPARISON: MRI from 09/09/2024 and CT from 09/08/2024. FINDINGS: CT HEAD: BRAIN: Changes of atrophy with chronic microvascular ischemic disease noted. Previously identified parenchymal changes are not well visualized by CT. No acute intracranial hemorrhage. No further acute large vessel territory infarct. No mass lesion or midline shift. No hydrocephalus or extra-axial fluid collection. Calcified atherosclerosis about the skull base. VENTRICLES: No hydrocephalus. ORBITS: Prior bilateral ocular lens replacement. SINUSES AND MASTOIDS: The paranasal sinuses and mastoid air cells are clear. CTA NECK: COMMON CAROTID ARTERIES: Atherosclerotic change about the right carotid bulb with 45% stenosis by NASCET criteria. Atherosclerotic change about the left carotid bulb with up to 55% by NASCET criteria. No dissection or occlusion. INTERNAL CAROTID ARTERIES: Atheromatous change present about the carotid siphons bilaterally, right  worse than left. There is resultant moderate stenosis at the periclinoid right ICA (series 11, image 108). No significant stenosis about the left siphon. 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 No dissection or occlusion. VERTEBRAL ARTERIES: Both vertebral arteries were also noted. Left vertebral artery dominant. Atherosclerotic plaque at the origin of the right vertebral artery without significant stenosis. No dissection or occlusion. SUBCLAVIAN ARTERIES: Subclavian  arteries noted. CTA HEAD: ANTERIOR CEREBRAL ARTERIES: A1 segments patent bilaterally. Normal anterior communicating artery complex. Both ACAs patent without significant stenosis. No occlusion. No aneurysm. MIDDLE CEREBRAL ARTERIES: No M1 stenosis. No proximal MCA branch occlusion or high-grade stenosis. Distal middle cerebral branches perfusion is symmetric. No significant beading or irregularity. No occlusion. No aneurysm. POSTERIOR CEREBRAL ARTERIES: Atheromatous irregularity about the V4 segments without significant stenosis. Both PICAs patent. Basilar patent without stenosis. Superior cerebellar and posterior cerebral arteries patent bilaterally. No beading or irregularity. No occlusion. No aneurysm. BASILAR ARTERY: Basilar patent without stenosis. No occlusion. No aneurysm. OTHER: SOFT TISSUES: No acute finding. No masses or lymphadenopathy. Upper lobe dominant centrilobular emphysema. 8 mm irregular pulmonary nodule present at the right upper lobe (series 7, image 21). Mild aortic atherosclerosis. BONES: No worrisome osseous lesions. Exaggeration of the normal upper thoracic kyphosis. Patient is edentulous. Calcified atherosclerosis about the skull base. Major dural sinuses are grossly patent, allowing for timing of contrast bolus. IMPRESSION: 1. Stable head CT. Previously identified parenchymal changes identified on prior MRI are not well visualized by CT. No other acute intracranial abnormality. 2. Negative CTA for large vessel occlusion or other emergent findings. No CTA evidence for active vasculitis. 3. Atherosclerotic disease about the carotid bifurcations bilaterally with 45% stenosis on the right and 55% stenosis on the left. 4. Moderate atheromatous stenosis at the paraclinoid right ICA. 5. 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 6. Emphysema. 7. 8 mm irregular right upper lobe pulmonary nodule detected on incomplete chest nation CT. Follow-up examination with prompt chest CT recommended  for further evaluation. Electronically signed by: Morene Hoard MD 09/10/2024 01:24 AM EDT RP Workstation: [id number]   MR BRAIN W WO CONTRAST Result Date: 09/09/2024 EXAM: MRI BRAIN WITH AND WITHOUT CONTRAST 09/09/2024 09:54:31 AM TECHNIQUE: Multiplanar multisequence MRI of the head/brain was performed with and without the administration of 5 mL gadobutrol (GADAVIST) 1 MMOL/ML intravenous contrast. COMPARISON: None available. CLINICAL HISTORY: Mental status change, unknown cause; confusion in patient with hx of shingles one month ago. FINDINGS: BRAIN AND VENTRICLES: There are small areas of diffusion abnormality at the anterior left insula, both frontal opercula, and in multiple locations of both corona radiata. Some of the lesions appear more acute than others based on lower ADC values. Multifocal hyperintense T2-weighted signal within the cerebral white matter, most commonly due to chronic small vessel disease. There are additional areas of white matter hyperintense T2-weighted signal with a more focal and rounded appearance than expected with the setting of chronic small vessel disease. Some of these areas also show mild contrast enhancement. Intrinsic hyperintense T1-weighted signal at the right parietooccipital sulcus. On the coronal T1-weighted postcontrast sequence, there is multifocal subcortical contrast enhancement greatest at the right parietooccipital fissure but present at multiple subcortical locations in both hemispheres. No acute intracranial hemorrhage. No mass effect or midline shift. No hydrocephalus. The sella is unremarkable. Mild volume loss. Normal flow voids. ORBITS: OK No acute abnormality. SINUSES: No acute abnormality. BONES AND SOFT TISSUES: Normal bone marrow signal and enhancement.  No acute soft tissue abnormality. IMPRESSION: 1. Multifocal subcortical contrast enhancement, greatest at the right parietooccipital fissure but present at multiple subcortical locations in both  hemispheres. 2. Small areas of diffusion abnormality at the anterior left insula, left frontal operculum, right frontal operculum, and in multiple locations of both corona radiata, with some lesions appearing more acute than others based on lower ADC values. 3. There are multiple possibilities raised by the above findings. Leading considerations are infectious/inflammatory encephalitis or vasculitis, and CSF sampling should be considered. Repeated embolic showers with scattered infarcts of different ages is also possible. Demyelinating disease is possible, but less likely. Electronically signed by: Franky Stanford MD 09/09/2024 11:32 AM EDT RP Workstation: [id number]   US  EKG SITE RITE Result Date: 09/08/2024 If Site Rite image not attached, placement could not be confirmed due to current cardiac rhythm.  CT Head Wo Contrast Result Date: 09/08/2024 EXAM: CT HEAD WITHOUT CONTRAST 09/08/2024 02:16:00 PM TECHNIQUE: CT of the head was performed without the administration of intravenous contrast. Automated exposure control, iterative reconstruction, and/or weight based adjustment of the mA/kV was utilized to reduce the radiation dose to as low as reasonably achievable. COMPARISON: 08/29/2024 CLINICAL HISTORY: Head trauma, minor (Age >= 65y). FINDINGS: BRAIN AND VENTRICLES: No acute hemorrhage. No evidence of acute infarct. No hydrocephalus. No extra-axial collection. No mass effect or midline shift. Age-related volume loss. Mild to moderate periventricular and deep cerebral white matter hypodensities, likely chronic small vessel ischemic disease. ORBITS: Status post bilateral lens replacement. SINUSES: No acute abnormality. SOFT TISSUES AND SKULL: No acute soft tissue abnormality. No skull fracture. VASCULATURE: Moderate vascular calcifications. IMPRESSION: 1. No acute intracranial abnormality. 2. Age-related volume loss. 3. Mild to moderate periventricular and deep cerebral white matter hypodensities, likely  chronic small vessel ischemic disease. Electronically signed by: Franky Stanford MD 09/08/2024 03:03 PM EDT RP Workstation: [id number]   DG Abdomen Acute W/Chest Result Date: 09/08/2024 EXAM: [employer] XRAY VIEWS OF THE ABDOMEN AND _VIEWS_ VIEW(S) OF THE CHEST 09/08/2024 12:19:28 PM COMPARISON: 06/20/2024 CLINICAL HISTORY: obstruction FINDINGS: LUNGS AND PLEURA: No consolidation or pulmonary edema. No pleural effusion or pneumothorax. HEART AND MEDIASTINUM: No acute abnormality of the cardiac and mediastinal silhouettes. BOWEL: The bowel gas pattern is nonspecific. No bowel obstruction. PERITONEUM AND SOFT TISSUES: No abnormal calcifications. No free air. BONES: No acute osseous abnormality. IMPRESSION: 1. No radiographic evidence of bowel obstruction or pneumoperitoneum. Electronically signed by: Lynwood Seip MD 09/08/2024 12:44 PM EDT RP Workstation: [id number]   CT Head Wo Contrast Result Date: 08/29/2024 EXAM: CT HEAD WITHOUT CONTRAST 08/29/2024 07:59:42 PM TECHNIQUE: CT of the head was performed without the administration of intravenous contrast. Automated exposure control, iterative reconstruction, and/or weight based adjustment of the mA/kV was utilized to reduce the radiation dose to as low as reasonably achievable. COMPARISON: 03/27/2018 CLINICAL HISTORY: Mental status change, unknown cause. Worsening confusion, AMS, weakness, and fatigue, reportedly beginning after a Shingles infection. FINDINGS: BRAIN AND VENTRICLES: No acute hemorrhage. No evidence of acute infarct. No hydrocephalus. No extra-axial collection. No mass effect or midline shift. Age-related cerebral volume loss and mild periventricular and deep cerebral white matter disease. Moderate calcific atheromatous disease within the carotid siphons. ORBITS: No acute abnormality. Bilateral lens replacement noted. SINUSES: No acute abnormality. SOFT TISSUES AND SKULL: No acute soft tissue abnormality. No skull fracture. IMPRESSION: 1. No  acute intracranial abnormality. Electronically signed by: Norman Gatlin MD 08/29/2024 08:05 PM EDT RP Workstation: [id number]     Discharge Exam: Vitals:   09/20/24 2336 09/21/24  0452  BP: 128/71 118/79  Pulse: 93 98  Resp: 14 16  Temp: 99.1 F (37.3 C) 98.3 F (36.8 C)  SpO2: 98% 98%   Vitals:   09/20/24 1707 09/20/24 2028 09/20/24 2336 09/21/24 0452  BP: 120/70 119/79 128/71 118/79  Pulse: (!) 59 70 93 98  Resp:  15 14 16   Temp: 98.7 F (37.1 C) 97.8 F (36.6 C) 99.1 F (37.3 C) 98.3 F (36.8 C)  TempSrc: Oral Oral Oral Axillary  SpO2: 95% 97% 98% 98%  Weight:      Height:        General: Pt is alert, awake, not in acute distress Cardiovascular: RRR, S1/S2 +, no rubs, no gallops Respiratory: CTA bilaterally, no wheezing, no rhonchi Abdominal: Soft, NT, ND, bowel sounds + Extremities: no edema, no cyanosis Neurology: Slight weakness in right upper and lower extremity.   The results of significant diagnostics from this hospitalization (including imaging, microbiology, ancillary and laboratory) are listed below for reference.     Microbiology: Recent Results (from the past 240 hours)  Varicella-zoster by PCR     Status: Abnormal   Collection Time: 09/11/24 11:50 AM   Specimen: Blood  Result Value Ref Range Status   Varicella-Zoster, PCR Positive (A) Negative Final    Comment: (NOTE) Varicella Zoster Virus DNA detected. This test was developed and its performance characteristics determined by [facility]. It has not been cleared or approved by the Food and Drug Administration. Performed At: [facility] [address] [place], [place] [id number] Jennette Shorter MD Ey:[id number]      Labs: BNP (last 3 results) No results for input(s): BNP in the last 8760 hours. Basic Metabolic Panel: Recent Labs  Lab 09/16/24 0314 09/17/24 0130 09/17/24 1046 09/18/24 0254 09/18/24 0255 09/19/24 0159 09/20/24 0140 09/21/24 0501  NA 129* 124* 126* 124*  --   123* 123* 124*  K 3.8 3.3* 3.6 3.2*  --  3.5 3.2* 3.3*  CL 99 93* 95* 94*  --  92* 90* 90*  CO2 20* 20* 19* 21*  --  20* 22 25  GLUCOSE 209* 195* 155* 164*  --  196* 183* 176*  BUN 16 15 15 13   --  14 16 30*  CREATININE 0.68 0.59* 0.55* 0.53*  --  0.57* 0.46* 0.54*  CALCIUM 7.6* 7.3* 7.3* 7.4*  --  7.1* 7.0* 6.6*  MG 1.7 1.7  --   --  1.6* 2.0  --   --   PHOS 2.1* 1.9*  1.9* 2.1* 2.7 2.6 2.0* <1.0* 2.2*   Liver Function Tests: Recent Labs  Lab 09/17/24 1046 09/18/24 0254 09/19/24 0159 09/20/24 0140 09/21/24 0501  ALBUMIN 2.3* 2.3* 2.4* 2.4* 2.2*   No results for input(s): LIPASE, AMYLASE in the last 168 hours. No results for input(s): AMMONIA in the last 168 hours. CBC: Recent Labs  Lab 09/15/24 0141  WBC 7.9  NEUTROABS 7.3  HGB 12.9*  HCT 37.4*  MCV 87.8  PLT 178   Cardiac Enzymes: No results for input(s): CKTOTAL, CKMB, CKMBINDEX, TROPONINI in the last 168 hours. BNP: Invalid input(s): POCBNP CBG: Recent Labs  Lab 09/20/24 1140 09/20/24 1618 09/20/24 2014 09/21/24 0040 09/21/24 0415  GLUCAP 177* 179* 147* 200* 191*   D-Dimer No results for input(s): DDIMER in the last 72 hours. Hgb A1c No results for input(s): HGBA1C in the last 72 hours. Lipid Profile No results for input(s): CHOL, HDL, LDLCALC, TRIG, CHOLHDL, LDLDIRECT in the last 72 hours. Thyroid function studies No results  for input(s): TSH, T4TOTAL, T3FREE, THYROIDAB in the last 72 hours.  Invalid input(s): FREET3 Anemia work up No results for input(s): VITAMINB12, FOLATE, FERRITIN, TIBC, IRON, RETICCTPCT in the last 72 hours. Urinalysis    Component Value Date/Time   COLORURINE YELLOW 09/08/2024 1356   APPEARANCEUR CLEAR 09/08/2024 1356   LABSPEC 1.009 09/08/2024 1356   PHURINE 8.0 09/08/2024 1356   GLUCOSEU NEGATIVE 09/08/2024 1356   HGBUR NEGATIVE 09/08/2024 1356   BILIRUBINUR NEGATIVE 09/08/2024 1356   KETONESUR NEGATIVE 09/08/2024  1356   PROTEINUR NEGATIVE 09/08/2024 1356   NITRITE NEGATIVE 09/08/2024 1356   LEUKOCYTESUR NEGATIVE 09/08/2024 1356   Sepsis Labs Recent Labs  Lab 09/15/24 0141  WBC 7.9   Microbiology Recent Results (from the past 240 hours)  Varicella-zoster by PCR     Status: Abnormal   Collection Time: 09/11/24 11:50 AM   Specimen: Blood  Result Value Ref Range Status   Varicella-Zoster, PCR Positive (A) Negative Final    Comment: (NOTE) Varicella Zoster Virus DNA detected. This test was developed and its performance characteristics determined by [facility]. It has not been cleared or approved by the Food and Drug Administration. Performed At: [facility] [address] [place], [place] [id number] Jennette Shorter MD Ey:[phone]     FURTHER DISCHARGE INSTRUCTIONS:   Get Medicines reviewed and adjusted: Please take all your medications with you for your next visit with your Primary MD   Laboratory/radiological data: Please request your Primary MD to go over all hospital tests and procedure/radiological results at the follow up, please ask your Primary MD to get all Hospital records sent to his/her office.   In some cases, they will be blood work, cultures and biopsy results pending at the time of your discharge. Please request that your primary care M.D. goes through all the records of your hospital data and follows up on these results.   Also Note the following: If you experience worsening of your admission symptoms, develop shortness of breath, life threatening emergency, suicidal or homicidal thoughts you must seek medical attention immediately by calling 911 or calling your MD immediately  if symptoms less severe.   You must read complete instructions/literature along with all the possible adverse reactions/side effects for all the Medicines you take and that have been prescribed to you. Take any new Medicines after you have completely understood and accpet all the possible  adverse reactions/side effects.    patient was instructed, not to drive, operate heavy machinery, perform activities at heights, swimming or participation in water activities or provide baby-sitting services while on Pain, Sleep and Anxiety Medications; until their outpatient Physician has advised to do so again. Also recommended to not to take more than prescribed Pain, Sleep and Anxiety Medications.  It is not advisable to combine anxiety, sleep and pain medications without talking with your primary care provider.     Wear Seat belts while driving.   Please note: You were cared for by a hospitalist during your hospital stay. Once you are discharged, your primary care physician will handle any further medical issues. Please note that NO REFILLS for any discharge medications will be authorized once you are discharged, as it is imperative that you return to your primary care physician (or establish a relationship with a primary care physician if you do not have one) for your post hospital discharge needs so that they can reassess your need for medications and monitor your lab values  Time coordinating discharge: Over 30 minutes  SIGNED:  Fredia Skeeter, MD  [facility] 09/21/2024, 10:18 AM *Please note that this is a verbal dictation therefore any spelling or grammatical errors are due to the Dragon Medical One system interpretation. If 7PM-7AM, please contact night-coverage [email or web address]

## 2024-09-22 DIAGNOSIS — I609 Nontraumatic subarachnoid hemorrhage, unspecified: Secondary | ICD-10-CM | POA: Diagnosis not present

## 2024-09-22 LAB — CBC WITH DIFFERENTIAL/PLATELET
Abs Immature Granulocytes: 0.08 K/uL — ABNORMAL HIGH (ref 0.00–0.07)
Basophils Absolute: 0 K/uL (ref 0.0–0.1)
Basophils Relative: 0 %
Eosinophils Absolute: 0.1 K/uL (ref 0.0–0.5)
Eosinophils Relative: 1 %
HCT: 36.3 % — ABNORMAL LOW (ref 39.0–52.0)
Hemoglobin: 12.5 g/dL — ABNORMAL LOW (ref 13.0–17.0)
Immature Granulocytes: 1 %
Lymphocytes Relative: 4 %
Lymphs Abs: 0.4 K/uL — ABNORMAL LOW (ref 0.7–4.0)
MCH: 30.6 pg (ref 26.0–34.0)
MCHC: 34.4 g/dL (ref 30.0–36.0)
MCV: 88.8 fL (ref 80.0–100.0)
Monocytes Absolute: 0.7 K/uL (ref 0.1–1.0)
Monocytes Relative: 7 %
Neutro Abs: 8.3 K/uL — ABNORMAL HIGH (ref 1.7–7.7)
Neutrophils Relative %: 87 %
Platelets: 208 K/uL (ref 150–400)
RBC: 4.09 MIL/uL — ABNORMAL LOW (ref 4.22–5.81)
RDW: 17.3 % — ABNORMAL HIGH (ref 11.5–15.5)
WBC: 9.6 K/uL (ref 4.0–10.5)
nRBC: 0 % (ref 0.0–0.2)

## 2024-09-22 LAB — COMPREHENSIVE METABOLIC PANEL WITH GFR
ALT: 26 U/L (ref 0–44)
AST: 18 U/L (ref 15–41)
Albumin: 2 g/dL — ABNORMAL LOW (ref 3.5–5.0)
Alkaline Phosphatase: 48 U/L (ref 38–126)
Anion gap: 10 (ref 5–15)
BUN: 23 mg/dL (ref 8–23)
CO2: 22 mmol/L (ref 22–32)
Calcium: 7.1 mg/dL — ABNORMAL LOW (ref 8.9–10.3)
Chloride: 100 mmol/L (ref 98–111)
Creatinine, Ser: 0.53 mg/dL — ABNORMAL LOW (ref 0.61–1.24)
GFR, Estimated: >60 mL/min (ref >60)
Glucose, Bld: 136 mg/dL — ABNORMAL HIGH (ref 70–99)
Potassium: 4.8 mmol/L (ref 3.5–5.1)
Sodium: 132 mmol/L — ABNORMAL LOW (ref 135–145)
Total Bilirubin: 0.7 mg/dL (ref 0.0–1.2)
Total Protein: 4.1 g/dL — ABNORMAL LOW (ref 6.5–8.1)

## 2024-09-22 LAB — GLUCOSE, CAPILLARY
Glucose-Capillary: 129 mg/dL — ABNORMAL HIGH (ref 70–99)
Glucose-Capillary: 137 mg/dL — ABNORMAL HIGH (ref 70–99)

## 2024-09-22 MED ORDER — OMEPRAZOLE 2 MG/ML ORAL SUSPENSION
20.0000 mg | Freq: Every day | ORAL | Status: DC
  Administered 2024-09-22 – 2024-09-24 (×3): 20 mg
  Filled 2024-09-22 (×4): qty 10

## 2024-09-22 MED ORDER — CALCIUM CARBONATE 1250 (500 CA) MG PO TABS
1000.0000 mg | ORAL_TABLET | Freq: Every day | ORAL | Status: DC
  Administered 2024-09-23 – 2024-09-24 (×2): 2500 mg via ORAL
  Filled 2024-09-22 (×2): qty 2

## 2024-09-22 MED ORDER — PROSOURCE TF20 ENFIT COMPATIBL EN LIQD
60.0000 mL | Freq: Every day | ENTERAL | Status: DC
  Administered 2024-09-22 – 2024-09-24 (×3): 60 mL
  Filled 2024-09-22 (×3): qty 60

## 2024-09-22 MED ORDER — OSMOLITE 1.5 CAL PO LIQD
1000.0000 mL | ORAL | Status: DC
  Administered 2024-09-22: 1000 mL

## 2024-09-22 MED ORDER — SODIUM CHLORIDE 1 G PO TABS
1.0000 g | ORAL_TABLET | Freq: Three times a day (TID) | ORAL | Status: DC
  Administered 2024-09-22: 1 g
  Filled 2024-09-22: qty 1

## 2024-09-22 NOTE — Evaluation (Signed)
 Occupational Therapy Assessment and Plan  Patient Details  Name: Noah Burnett MRN: [id number] Date of Birth: 1941/03/25  OT Diagnosis: abnormal posture, muscle weakness (generalized), hemiparesis of dominant UE, decreased activity tolerance, and decreased balance strategies Rehab Potential: Rehab Potential (ACUTE ONLY): Fair ELOS: ~2 weeks   Today's Date: 09/22/2024 OT Individual Time: 9058-8954 OT Individual Time Calculation (min): 64 min     Hospital Problem: Principal Problem:   Subarachnoid hemorrhage, nontraumatic (HCC) Active Problems:   Viral encephalitis   Past Medical History:  Past Medical History:  Diagnosis Date   Arthritis    Bladder tumor    Deafness, left    Full dentures    GERD (gastroesophageal reflux disease)    History of [place] spotted fever    01-25-2020  per pt had in 2018 and was treated w/ no residual   Hyperplasia of prostate with lower urinary tract symptoms (LUTS)    Hypertension    followed by pcp   (01-25-2020  per pt had stress test approx. 2005, told normal , done in [place] somewhere)   Pre-diabetes    followed by pcp ,   watches diet   Prostate cancer [name]) urologist--- dr watt   dx by bx01-13-2020,  Gleason 3+4 ;  MRI 12-19-2018, vol 96.26;   active survillance   Shingles    Wears glasses    Wears hearing aid in right ear    Past Surgical History:  Past Surgical History:  Procedure Laterality Date   SHOULDER ARTHROSCOPY WITH ROTATOR CUFF REPAIR AND SUBACROMIAL DECOMPRESSION Right 05-20-2009  @[facility]    TRANSURETHRAL RESECTION OF BLADDER TUMOR WITH MITOMYCIN -C N/A 02/01/2020   Procedure: TRANSURETHRAL RESECTION OF BLADDER TUMOR;  Surgeon: Watt Rush, MD;  Location: [facility];  Service: Urology;  Laterality: N/A;   TRANSURETHRAL RESECTION OF PROSTATE      Assessment & Plan Clinical Impression: Patient is a 83 year old right-handed male with history significant for hypertension, prediabetes, GERD, quit  smoking 25 years ago, 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  He had an EGD that showed hiatal hernia and compression of the second portion of duodenum with possible intraluminal neoplasm.  Biopsies were obtained which showed severely inflamed and ulcerated duodenal mucosa, but no evidence of malignancy.  A second EGD was done with ultrasound guidance but again pathology of the lesion extensive necrosis.  Surgical oncology consulted underwent robotic gastrojejunostomy on 8/25.  He did have delayed return of bowel function and a PICC line with TPN for several days until he had advancement of his diet.  Since his procedure he had had fairly steady decline with decreasing appetite significant weight loss and inability to walk.  He also had follow-up surgical oncology.  He had repeat MRI which did not show the duodenal mass.  Per chart review patient lives alone.  1 level home one-step to entry.  He has required assist from his family for ADLs and use of a rolling walker for mobility the past 2 weeks and has experienced 1 fall in the past 2 weeks.  Presented 09/08/2024 to [facility] with altered mental status and unwitnessed fall as well as reports of generalized weakness and fatigue.  In the ED abdominal x-ray showed no evidence of obstruction.  Cranial CT scan negative for acute changes.  MRI showed multifocal subcortical contrast-enhancement, greatest at the right  parietal-occipital fissure but present at multiple subcortical locations in both hemispheres.  Small areas of diffusion abnormality at the anterior left insula, left frontal operculum, right frontal operculum, and in multiple locations of both corona radiata and some lesions appearing more acute than others based on lower ADC values.  There was multiple possibilities raised by above findings  considerations infectious versus inflammatory encephalitis versus vasculitis in CSF.  Patient was transferred to [facility] for further evaluation.  Chemistries unremarkable except sodium 128 potassium 3.4 chloride 90 AST 14, total CK of 30, TSH 4.632, a.m. cortisol 30, serum osmolality 257, urine sodium 137, urine osmolality 490 urinalysis negative for pyuria.  Neurology was consulted for evaluation of acute encephalopathy as well as nephrology consulted for hyponatremia.  Patient underwent fluoroscopic guided lumbar puncture and fluid sent for analysis for evaluation of meningitis encephalitis.  Procalcitonin negative, CRP 4.1.  EEG showed diffuse cerebral dysfunction no seizure seen throughout the recording.  VZV PCR was positive, varicella IgG negative.  On the morning of 09/13/2024 patient noted to have left gaze preference and decreased responsiveness code stroke was called patient was seen by neurology services.  Stat CT was done which showed left superior convexity subarachnoid hemorrhage without mass effect.  Otherwise no changes from previous CT.  CT angiogram ruled out large vessel occlusion.  Patient was initially kept n.p.o. follow-up speech therapy.  Eventually MRI completed showed prominent patchy cortical and subcortical signal abnormality throughout both hemispheres overall slight progressed from 09/09/2024 and intermediate although encephalitis and vasculitis remain leading considerations.  ID recommended continuing acyclovir.  Patient's mental status continued to improve.  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  Patient was also started on high-dose methylprednisolone by neurology 09/14/2024 with the plan to continue for 5 days followed by taper of prednisone. 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  The sodium level had actually worsened with normal saline and since IV fluid was required during acyclovir treatment reduce to 25 cc an hour and received a dose of Lasix 09/19/2024 as well as 09/20/2024 and plan to continue Lasix at 20 mg p.o. daily. Bouts of urinary retention on the evening of 09/13/2024 patient was retaining urine bladder scan for 900 cc orders given for nurse for straight caths however that was unsuccessful so coud catheter was placed and started on Flomax. AKI creatinine jumped from 0.76-1 0.66-2.07 09/14/2024 nephrology had been following at that time given IV fluids AKI presumed to be secondary to obstructive uropathy as well as contrast-induced.  AKI resolved 09/15/2024.  Patient also with bouts of hypophosphatemia supplemented with follow-up chemistries Patient was cleared to begin Lovenox for DVT prophylaxis. His diet has been advanced to mechanical soft initially requiring tube feeds for nutritional support.  Palliative care was consulted to establish goals of care.  Patient did have some persistent nausea and vomiting with KUB 09/17/2024 showing ileus.  He was having no tenderness with regular bowel movements reported. Patient transferred to CIR on 09/21/2024 .    Patient currently requires mod with basic self-care skills secondary to muscle weakness, decreased cardiorespiratoy endurance, unbalanced muscle activation and decreased coordination, decreased memory, and decreased  standing balance and decreased balance strategies.  Prior to hospitalization, patient could complete BADLs with Mod I.   Patient will benefit from skilled intervention to increase independence with basic self-care skills prior to discharge home with care partner.  Anticipate patient will require 24 hour  supervision and follow up outpatient.  OT - End of Session Activity Tolerance: Tolerates 10 - 20 min activity with multiple rests Endurance Deficit: Yes OT Assessment Rehab Potential (ACUTE ONLY): Fair OT Barriers to Discharge: Home environment access/layout;Neurogenic Bowel & Bladder;Nutrition means OT Patient demonstrates impairments in the following area(s): Balance;Endurance;Motor;Skin Integrity;Safety OT Basic ADL's Functional Problem(s): Bathing;Dressing;Toileting OT Transfers Functional Problem(s): Toilet;Tub/Shower OT Additional Impairment(s): Fuctional Use of Upper Extremity OT Plan OT Intensity: Minimum of 1-2 x/day, 45 to 90 minutes OT Frequency: 5 out of 7 days OT Duration/Estimated Length of Stay: ~2 weeks OT Treatment/Interventions: Balance/vestibular training;Cognitive remediation/compensation;Community reintegration;Discharge planning;Disease mangement/prevention;DME/adaptive equipment instruction;Functional electrical stimulation;Functional mobility training;Neuromuscular re-education;Patient/family education;Psychosocial support;Self Care/advanced ADL retraining;Skin care/wound managment;Splinting/orthotics;Therapeutic Activities;Therapeutic Exercise;UE/LE Strength taining/ROM;UE/LE Coordination activities OT Basic Self-Care Anticipated Outcome(s): Supervision OT Toileting Anticipated Outcome(s): Supervision OT Bathroom Transfers Anticipated Outcome(s): Supervision OT Recommendation Recommendations for Other Services: Neuropsych consult Patient destination: Home Follow Up Recommendations: Outpatient OT Equipment Recommended: To be determined   OT Evaluation Precautions/Restrictions  Precautions Precautions: Fall Recall of Precautions/Restrictions: Impaired Precaution/Restrictions Comments: Cortrak Restrictions Weight Bearing Restrictions Per Provider Order: No General Chart Reviewed: Yes Family/Caregiver Present: Yes (Daughter) Vital Signs Therapy Vitals BP:  133/69 Pain Pain Assessment Pain Scale: 0-10 Pain Score: 0-No pain Home Living/Prior Functioning Home Living Family/patient expects to be discharged to:: Private residence (with family) Living Arrangements: Alone Available Help at Discharge: Family, Available 24 hours/day Type of Home: House Home Access: Stairs to enter Secretary/administrator of Steps: 1 Entrance Stairs-Rails: None Home Layout: One level Bathroom Shower/Tub: Electronics Engineer bars) Bathroom Toilet: Handicapped height Bathroom Accessibility: Yes  Lives With: Alone Prior Function Level of Independence: Independent with basic ADLs, Independent with homemaking with ambulation, Independent with gait, Independent with transfers (Intermediate use of RW when cued by family.) Vocation: Retired Administrator, Sports Baseline Vision/History: 1 Wears glasses Wears Glasses: Reading only Ability to See in Adequate Light: 1 Impaired Patient Visual Report: No change from baseline Vision Assessment?: Wears glasses for reading Perception  Perception: Within Functional Limits Praxis Praxis: WFL Cognition Cognition Overall Cognitive Status: Impaired/Different from baseline Arousal/Alertness: Awake/alert Orientation Level: Place Memory: Impaired Memory Impairment: Decreased short term memory;Decreased recall of new information;Retrieval deficit;Storage deficit Brief Interview for Mental Status (BIMS) Repetition of Three Words (First Attempt): 3 Temporal Orientation: Year: Correct Temporal Orientation: Month: Missed by 6 days to 1 month Temporal Orientation: Day: Incorrect Recall: Sock: No, could not recall Recall: Blue: No, could not recall Recall: Bed: No, could not recall BIMS Summary Score: 7 Sensation Sensation Light Touch: Appears Intact Hot/Cold: Appears Intact Proprioception: Appears Intact Stereognosis: Not tested Coordination Gross Motor Movements are Fluid and Coordinated: No Fine Motor Movements  are Fluid and Coordinated: No Coordination and Movement Description: Decreased coordination secondary to very mild R hemiparesis and debility Motor  Motor Motor: Hemiplegia Motor - Skilled Clinical Observations: Very mild R hemiparesis and debility  Trunk/Postural Assessment  Cervical Assessment Cervical Assessment: Exceptions to [facility] (forward head) Thoracic Assessment Thoracic Assessment: Exceptions to [facility] (rounded shoulders, significant thoracic kyphosis) Lumbar Assessment Lumbar Assessment: Exceptions to [facility] (posterior pelvic tilt) Postural Control Postural Control: Deficits on evaluation Righting Reactions: delayed and inadequate Protective Responses: decreased Postural Limitations: decreased  Balance Static Sitting Balance Static Sitting -  Balance Support: Feet supported;Bilateral upper extremity supported Static Sitting - Level of Assistance: 5: Stand by assistance (SUP) Dynamic Sitting Balance Dynamic Sitting - Balance Support: Bilateral upper extremity supported;Feet supported Dynamic Sitting - Level of Assistance: 5: Stand by assistance (SUP-CGA) Static Standing Balance Static Standing - Balance Support: Bilateral upper extremity supported;During functional activity Static Standing - Level of Assistance: 5: Stand by assistance (CGA) Dynamic Standing Balance Dynamic Standing - Balance Support: Bilateral upper extremity supported;During functional activity Dynamic Standing - Level of Assistance: 4: Min assist;5: Stand by assistance (CGA-Min A) Extremity/Trunk Assessment RUE Assessment RUE Assessment: Exceptions to [facility] Active Range of Motion (AROM) Comments: WFL General Strength Comments: Decreased grip strength and opposition RUE Body System: Neuro Brunstrum levels for arm and hand: Arm;Hand Brunstrum level for arm: Stage V Relative Independence from Synergy Brunstrum level for hand: Stage VI Isolated joint movements LUE Assessment LUE Assessment: Within Functional  Limits  Care Tool Care Tool Self Care Eating   Eating Assist Level: Set up assist    Oral Care    Oral Care Assist Level: Set up assist    Bathing   Body parts bathed by patient: Right arm;Left arm;Chest;Abdomen;Right upper leg;Left upper leg;Front perineal area;Buttocks;Face Body parts bathed by helper: Right lower leg;Left lower leg   Assist Level: Minimal Assistance - Patient > 75%    Upper Body Dressing(including orthotics)   What is the patient wearing?: Pull over shirt   Assist Level: Moderate Assistance - Patient 50 - 74%    Lower Body Dressing (excluding footwear)   What is the patient wearing?: Incontinence brief;Pants Assist for lower body dressing: Maximal Assistance - Patient 25 - 49%    Putting on/Taking off footwear   What is the patient wearing?: Non-skid slipper socks;Socks Assist for footwear: Total Assistance - Patient < 25%       Care Tool Toileting Toileting activity   Assist for toileting: Moderate Assistance - Patient 50 - 74%     Care Tool Bed Mobility Roll left and right activity        Sit to lying activity        Lying to sitting on side of bed activity         Care Tool Transfers Sit to stand transfer        Chair/bed transfer         Toilet transfer   Assist Level: Minimal Assistance - Patient > 75%     Refer to Care Plan for Long Term Goals  SHORT TERM GOAL WEEK 1 OT Short Term Goal 1 (Week 1): Pt will thread LB garments with CGA. OT Short Term Goal 2 (Week 1): Pt will complete 3/3 toileting tasks with Min A + LRAD. OT Short Term Goal 3 (Week 1): Pt will dress UB with Min A.  Recommendations for other services: Neuropsych   Skilled Therapeutic Intervention Session began with introduction to OT role, OT POC, and general orientation to rehab unit/schedule. Pt completes full-body sponge-bathing/dressing with levels of assistance noted below. Pt remained resting in bed with daughter at bedside.   ADL ADL Eating: Set  up Where Assessed-Eating: Bed level Grooming: Setup Where Assessed-Grooming: Edge of bed Upper Body Bathing: Setup;Minimal assistance Where Assessed-Upper Body Bathing: Edge of bed Lower Body Bathing: Maximal assistance Where Assessed-Lower Body Bathing: Edge of bed Upper Body Dressing: Moderate assistance Where Assessed-Upper Body Dressing: Edge of bed Lower Body Dressing: Moderate assistance;Maximal assistance Where Assessed-Lower Body Dressing: Edge of bed Toileting: Minimal assistance;Moderate assistance Where Assessed-Toileting:  Teacher, Adult Education: Scientific Laboratory Technician Method: Proofreader: Other (comment) (RW) Tub/Shower Transfer: Not assessed Film/video Editor: Not assessed Mobility  Bed Mobility Bed Mobility: Supine to Sit;Sit to Supine Supine to Sit: Minimal Assistance - Patient > 75% Sit to Supine: Minimal Assistance - Patient > 75% Transfers Sit to Stand: Minimal Assistance - Patient > 75% Stand to Sit: Minimal Assistance - Patient > 75%;Contact Guard/Touching assist   Discharge Criteria: Patient will be discharged from OT if patient refuses treatment 3 consecutive times without medical reason, if treatment goals not met, if there is a change in medical status, if patient makes no progress towards goals or if patient is discharged from hospital.  The above assessment, treatment plan, treatment alternatives and goals were discussed and mutually agreed upon: by patient and by family  Nereida Habermann, OTR/L, MSOT  09/22/2024, 10:48 AM

## 2024-09-22 NOTE — Progress Notes (Addendum)
 Initial Nutrition Assessment  DOCUMENTATION CODES:   Severe malnutrition in context of chronic illness, Underweight  INTERVENTION:  Resume Osmolite 1.5 via duotube at 60 mL/hr X 20 hours daily to allow for therapy holds. Continue ProSource TF20 once daily via feeding tube. This EN regimen provides 1880 Kcals, 95 g protein, 244 g carbohydrates, and 914 mL free water daily, which provides 100% of Noah patient's estimated nutrition needs. Continue minimal water flushes to maintain feeding tube patency in light of persistent hyponatremia. Continue Thiamine 100 mg PO daily. Continue Multivitamin PO daily. Continue Noah Burnett 1.4 PO BID. Each supplement provides 455 Kcals and 20 grams protein. Continue Dysphagia 3 diet with thin liquids - advancement per SLP.   NUTRITION DIAGNOSIS:   Severe Malnutrition related to chronic illness as evidenced by energy intake < 75% for > or equal to 1 month, severe muscle depletion, severe fat depletion, percent weight loss (18% in 3 months).   GOAL:   Patient will meet greater than or equal to 90% of their needs, Weight gain   MONITOR:   PO intake, Supplement acceptance, Diet advancement, Labs, Weight trends, TF tolerance   REASON FOR ASSESSMENT:   Consult Enteral/tube feeding initiation and management  ASSESSMENT:   Noah patient is a CIR admit from [facility] hospitalization whereby he presented with decreased oral intake, generalized weakness and confusion, and was found to have failure to thrive, metabolic encephalopathy, subacute small vessel strokes, and varicella-zoster viral encephalitis. Noah patient had persistently poor PO intake X several months prior to admission that continued during admission requiring Cortrak placement and EN. 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  Pertinent timeline from prior admission:  11/3 - patient's son and daughter at bedside report poor PO intake since duodenal mass diagnosis in July with worsening intake X 6 weeks prior to admission due to shingles with only bites of food. 50% of meals/ONS reported this day with improved ONS intake when changed to Noah Burnett.  11/6 - Noah patient was downgraded from reg diet to Dys 3/thin s/p SLP eval on 11/5. Visited Noah patient with his daughter Noah Burnett at bedside. Noah Burnett states Noah patient did not eat yesterday and just kept sleeping. He only took bites of breakfast today and has not wanted to drink Noah Burnett. He will not drink anything with dairy in it because it affects his bowel movements. Suggested switching ONS to Noah Burnett. Discussed Cortrak and EN with Noah patient and Noah Burnett since Noah patient has had poor PO intake for a few months and is not currently improving. Noah Burnett agreed to pursue it. Epic chat Dr Vernon who has placed orders for Cortrak.  11/7 - Cortrak placed, EN initiated  11/8 - TF goal of 40 mL/hr reached  11/9 - N/V reported with abdominal pain. Noah patient then had 2 bowel movements. KUB ordered. TF stopped per attending.  11/10 - S/P repeat KUB this morning showing no ileus but suspicion that enteric tube tip is projecting over Noah lower abdomen, likely in Noah duodenum per radiology. Dr Vernon believes Noah tip is in Noah stomach and OK restarting of TF slowly. D/W RN. To keep patient NPO for now except for medications. Noah patient continues with minimal PO intake since last follow-up and remains on Dysphagia 3 diet with thin liquids. CIR discharge pending resolution of suspected  ileus. Palliative continues to follow with today's discussion with daughter resulting in continued full code.  Update: Visited Noah patient who was admitted to CIR yesterday. He continues to have minimal intake. He could not express whether he has  been drinking his Noah Burnett. No family at bedside during visit. He denies any N/V or abdominal discomfort with TF, which is currently off. Last charted at 40 mL/hr goal yesterday. D/W LPN.   Scheduled Meds:  amLODipine  5 mg Per Tube Daily   [START ON 09/23/2024] calcium carbonate  1,000 mg of elemental calcium Oral Q breakfast   vitamin B-12  500 mcg Per Tube Daily   enoxaparin (LOVENOX) injection  30 mg Subcutaneous Q24H   feeding supplement (Noah Burnett STANDARD ENT 1.4)  325 mL Per Tube BID BM   furosemide  20 mg Per Tube Daily   multivitamin with minerals  1 tablet Per Tube Daily   pantoprazole   40 mg Oral Daily   polyethylene glycol  17 g Per Tube Daily   potassium & sodium phosphates  1 packet Per Tube TID AC & HS   rosuvastatin  10 mg Per Tube Daily   senna  2 tablet Per Tube Daily   sodium chloride   1 g Per Tube TID WC   tamsulosin  0.4 mg Oral QPC supper   Continuous Infusions:  sodium chloride  20 mL/hr at 09/22/24 0658   acyclovir (ZOVIRAX) 455 mg in dextrose  5 % 100 mL IVPB Stopped (09/21/24 2132)    Diet Order             DIET DYS 3 Room service appropriate? Yes; Fluid consistency: Thin  Diet effective now                  Meal Intake: 10%  Labs:     Latest Ref Rng & Units 09/22/2024    5:26 AM 09/21/2024    7:55 PM 09/21/2024    5:01 AM  CMP  Glucose 70 - 99 mg/dL 863   823   BUN 8 - 23 mg/dL 23   30   Creatinine 9.38 - 1.24 mg/dL 9.46  9.42  9.45   Sodium 135 - 145 mmol/L 132   124   Potassium 3.5 - 5.1 mmol/L 4.8   3.3   Chloride 98 - 111 mmol/L 100   90   CO2 22 - 32 mmol/L 22   25   Calcium 8.9 - 10.3 mg/dL 7.1   6.6   Total Protein 6.5 - 8.1 g/dL 4.1     Total Bilirubin 0.0 - 1.2 mg/dL 0.7     Alkaline Phos 38 - 126 U/L 48     AST 15 - 41 U/L 18     ALT 0 - 44 U/L 26         I/O: -500 mL since admit  NUTRITION - FOCUSED PHYSICAL EXAM: Flowsheet Row Most Recent Value  Orbital Region Moderate depletion  Upper Arm Region Severe  depletion  Thoracic and Lumbar Region Severe depletion  Buccal Region Severe depletion  Temple Region Severe depletion  Clavicle Bone Region Severe depletion  Clavicle and Acromion Bone Region Severe depletion  Scapular Bone Region Severe depletion  Dorsal Hand Moderate depletion  Patellar Region Severe depletion  Anterior Thigh Region Severe depletion  Posterior Calf Region Severe depletion  Edema (RD Assessment) None  Hair Reviewed  Eyes Reviewed  Mouth Other (Comment)  [requires full dentures]  Skin Reviewed  Nails Reviewed  EDUCATION NEEDS:   Education needs have been addressed  Skin:  Skin Assessment: Reviewed RN Assessment  Last BM:  11/14 type 6  Height:   Ht Readings from Last 1 Encounters:  09/21/24 5' 7 (1.702 m)    Weight:     18% weight loss in 3 months  Ideal Body Weight:  67 kg  BMI:  Body mass index is 16.47 kg/m.  Estimated Nutritional Needs:  Kcal:  1500-1800 Protein:  75-100 Fluid:  1500-1800    Noah Ruth, MS, RDN, LDN . [facility] See AMION for contact information

## 2024-09-22 NOTE — Discharge Summary (Cosign Needed Addendum)
 Physician Discharge Summary  Patient ID: Noah Burnett MRN: [id number] DOB/AGE: [birth year] 83 y.o.  Admit date: 09/21/2024 Discharge date: 09/24/2024  Discharge Diagnoses:  Principal Problem:   Subarachnoid hemorrhage, nontraumatic (HCC) Active Problems:   Viral encephalitis DVT prophylaxis Chronic hyponatremia Acute urinary retention with history of prostate cancer AKI GERD/duodenal ulcer Decreased nutritional storage Hypertension Recent shingles Hyperlipidemia Prediabetes Ischemic bowel  Discharged Condition: Stable  Significant Diagnostic Studies: CT ABDOMEN PELVIS W CONTRAST Addendum Date: 09/24/2024 ADDENDUM REPORT: 09/24/2024 14:17 ADDENDUM: Above findings indicate ischemic small bowel with perforation. Site of perforation unclear as may be related to the narrowed jejunal loop with twisting of the mesentery in the midline abdomen just right of midline as described. Critical Value/emergent results were called by telephone at the time of interpretation on 09/24/2024 at 2:15 pm to provider [facility] , who verbally acknowledged these results. Electronically Signed   By: Toribio Agreste M.D.   On: 09/24/2024 14:17   Result Date: 09/24/2024 CLINICAL DATA:  Shortness of breath. Abdominal pain. High probability of pulmonary embolism. EXAM: CT ANGIOGRAPHY CHEST CT ABDOMEN AND PELVIS WITH CONTRAST TECHNIQUE: Multidetector CT imaging of the chest was performed using the standard protocol during bolus administration of intravenous contrast. Multiplanar CT image reconstructions and MIPs were obtained to evaluate the vascular anatomy. Multidetector CT imaging of the abdomen and pelvis was performed using the standard protocol during bolus administration of intravenous contrast. RADIATION DOSE REDUCTION: This exam was performed according to the departmental dose-optimization program which includes automated exposure control, adjustment of the mA and/or kV according to patient size and/or  use of iterative reconstruction technique. CONTRAST:  75mL OMNIPAQUE  IOHEXOL  350 MG/ML SOLN COMPARISON:  CT abdomen/pelvis 06/20/2024 FINDINGS: CTA CHEST FINDINGS Cardiovascular: Stable borderline cardiomegaly. Calcified plaque over the left anterior descending and right coronary arteries. Thoracic aorta is normal in caliber. Mild calcified plaque over the descending thoracic aorta. Pulmonary arterial system is adequately opacified without evidence of emboli. Remaining vascular structures are unremarkable. Mediastinum/Nodes: No mediastinal or hilar adenopathy. Mild-to-moderate dilatation of the esophagus with air-fluid level. Prominent fluid-filled distal esophagus without significant change from the prior exam which may be due to reflux or dysmotility versus hiatal hernia. Lungs/Pleura: Centrilobular emphysematous disease is present. Lungs are adequately inflated. There is mild bilateral posterior airspace density which may be due to atelectasis or infection. Small to moderate bilateral pleural effusions right worse than left. Subtle hazy patchy somewhat nodular airspace opacification over the right upper lobe with minimal opacification over the posterior dependent upper lobes. Findings may be due to infection or inflammatory process and less likely underlying neoplasm. Airways are normal. Musculoskeletal: No focal abnormality. Review of the MIP images confirms the above findings. CT ABDOMEN and PELVIS FINDINGS Hepatobiliary: Several liver cysts unchanged. Gallbladder and biliary tree are unremarkable. Pancreas: Normal. Spleen: Normal. Adrenals/Urinary Tract: Adrenal glands are normal. Kidneys are normal in size without hydronephrosis or nephrolithiasis. Bilateral renal cysts unchanged. Ureters are unremarkable. Bladder minimally distended with small focus of air along the nondependent portion likely due to recent instrumentation. Stomach/Bowel: Stomach distended with fluid and air. Dilatation of the third portion  of the duodenum measuring 3.2 cm in diameter. There is also dilatation of the proximal jejunum measuring 3.4 cm. The dilated duodenum becomes significantly narrowed as it crosses the midline from left to right just below the level of the aortic bifurcation. Small bowel in this focal area appears to be associated with slight twisting of the mesentery. There are multiple mid to distal small bowel loops  which are dilated and demonstrate diffuse pneumatosis. Distal ileal loop measures 3.6 cm in diameter. There is moderate fecal retention over the rectum. Stool and air-filled right colon, transverse colon and splenic flexure. There is evidence of small amount of free peritoneal air best seen over the right peripelvic region anteriorly. Appendix not definitely visualized. Vascular/Lymphatic: Moderate calcified plaque over the abdominal aorta which is normal caliber. Celiac axis and superior mesenteric artery and branches appear to be patent. Inferior mesenteric artery not well visualized. Narrowed but patent left internal iliac artery. Portal vein and superior mesenteric vein are patent. No adenopathy. Reproductive: Moderate prostatic enlargement with moderate impression upon the bladder base unchanged. Other: Moderate free fluid over the pelvis. Musculoskeletal: No focal abnormality. Review of the MIP images confirms the above findings. IMPRESSION: 1. No evidence of pulmonary embolism. 2. Small to moderate bilateral pleural effusions right worse than left with associated bibasilar posterior airspace density which may be due to atelectasis or infection. Subtle hazy patchy somewhat nodular airspace opacification over the right upper lobe with minimal opacification over the posterior dependent upper lobes. Findings may be due to infection or inflammatory process and much less likely underlying neoplasm. Recommend follow-up CT 4-6 weeks. 3. Evidence of free peritoneal air with multiple mid to distal dilated small bowel loops  demonstrating moderate diffuse pneumatosis. Findings are concerning for bowel ischemia. 4. Significant narrowing of the proximal jejunum as it crosses the midline from left to right just below the level of the aortic bifurcation. Small bowel in this focal area appears to be associated with slight twisting of the mesentery. Findings are concerning for small bowel obstruction due to volvulus or closed loop obstruction. 5. Moderate free fluid over the pelvis. 6. Moderate prostatic enlargement with moderate impression upon the bladder base unchanged. 7. Aortic atherosclerosis. Atherosclerotic coronary artery disease. Aortic Atherosclerosis (ICD10-I70.0) and Emphysema (ICD10-J43.9). Currently patient provider. Electronically Signed: By: Toribio Agreste M.D. On: 09/24/2024 14:04   CT Angio Chest Pulmonary Embolism (PE) W or WO Contrast Addendum Date: 09/24/2024 ADDENDUM REPORT: 09/24/2024 14:17 ADDENDUM: Above findings indicate ischemic small bowel with perforation. Site of perforation unclear as may be related to the narrowed jejunal loop with twisting of the mesentery in the midline abdomen just right of midline as described. Critical Value/emergent results were called by telephone at the time of interpretation on 09/24/2024 at 2:15 pm to provider [facility] , who verbally acknowledged these results. Electronically Signed   By: Toribio Agreste M.D.   On: 09/24/2024 14:17   Result Date: 09/24/2024 CLINICAL DATA:  Shortness of breath. Abdominal pain. High probability of pulmonary embolism. EXAM: CT ANGIOGRAPHY CHEST CT ABDOMEN AND PELVIS WITH CONTRAST TECHNIQUE: Multidetector CT imaging of the chest was performed using the standard protocol during bolus administration of intravenous contrast. Multiplanar CT image reconstructions and MIPs were obtained to evaluate the vascular anatomy. Multidetector CT imaging of the abdomen and pelvis was performed using the standard protocol during bolus administration of  intravenous contrast. RADIATION DOSE REDUCTION: This exam was performed according to the departmental dose-optimization program which includes automated exposure control, adjustment of the mA and/or kV according to patient size and/or use of iterative reconstruction technique. CONTRAST:  75mL OMNIPAQUE  IOHEXOL  350 MG/ML SOLN COMPARISON:  CT abdomen/pelvis 06/20/2024 FINDINGS: CTA CHEST FINDINGS Cardiovascular: Stable borderline cardiomegaly. Calcified plaque over the left anterior descending and right coronary arteries. Thoracic aorta is normal in caliber. Mild calcified plaque over the descending thoracic aorta. Pulmonary arterial system is adequately opacified without evidence of emboli.  Remaining vascular structures are unremarkable. Mediastinum/Nodes: No mediastinal or hilar adenopathy. Mild-to-moderate dilatation of the esophagus with air-fluid level. Prominent fluid-filled distal esophagus without significant change from the prior exam which may be due to reflux or dysmotility versus hiatal hernia. Lungs/Pleura: Centrilobular emphysematous disease is present. Lungs are adequately inflated. There is mild bilateral posterior airspace density which may be due to atelectasis or infection. Small to moderate bilateral pleural effusions right worse than left. Subtle hazy patchy somewhat nodular airspace opacification over the right upper lobe with minimal opacification over the posterior dependent upper lobes. Findings may be due to infection or inflammatory process and less likely underlying neoplasm. Airways are normal. Musculoskeletal: No focal abnormality. Review of the MIP images confirms the above findings. CT ABDOMEN and PELVIS FINDINGS Hepatobiliary: Several liver cysts unchanged. Gallbladder and biliary tree are unremarkable. Pancreas: Normal. Spleen: Normal. Adrenals/Urinary Tract: Adrenal glands are normal. Kidneys are normal in size without hydronephrosis or nephrolithiasis. Bilateral renal cysts  unchanged. Ureters are unremarkable. Bladder minimally distended with small focus of air along the nondependent portion likely due to recent instrumentation. Stomach/Bowel: Stomach distended with fluid and air. Dilatation of the third portion of the duodenum measuring 3.2 cm in diameter. There is also dilatation of the proximal jejunum measuring 3.4 cm. The dilated duodenum becomes significantly narrowed as it crosses the midline from left to right just below the level of the aortic bifurcation. Small bowel in this focal area appears to be associated with slight twisting of the mesentery. There are multiple mid to distal small bowel loops which are dilated and demonstrate diffuse pneumatosis. Distal ileal loop measures 3.6 cm in diameter. There is moderate fecal retention over the rectum. Stool and air-filled right colon, transverse colon and splenic flexure. There is evidence of small amount of free peritoneal air best seen over the right peripelvic region anteriorly. Appendix not definitely visualized. Vascular/Lymphatic: Moderate calcified plaque over the abdominal aorta which is normal caliber. Celiac axis and superior mesenteric artery and branches appear to be patent. Inferior mesenteric artery not well visualized. Narrowed but patent left internal iliac artery. Portal vein and superior mesenteric vein are patent. No adenopathy. Reproductive: Moderate prostatic enlargement with moderate impression upon the bladder base unchanged. Other: Moderate free fluid over the pelvis. Musculoskeletal: No focal abnormality. Review of the MIP images confirms the above findings. IMPRESSION: 1. No evidence of pulmonary embolism. 2. Small to moderate bilateral pleural effusions right worse than left with associated bibasilar posterior airspace density which may be due to atelectasis or infection. Subtle hazy patchy somewhat nodular airspace opacification over the right upper lobe with minimal opacification over the posterior  dependent upper lobes. Findings may be due to infection or inflammatory process and much less likely underlying neoplasm. Recommend follow-up CT 4-6 weeks. 3. Evidence of free peritoneal air with multiple mid to distal dilated small bowel loops demonstrating moderate diffuse pneumatosis. Findings are concerning for bowel ischemia. 4. Significant narrowing of the proximal jejunum as it crosses the midline from left to right just below the level of the aortic bifurcation. Small bowel in this focal area appears to be associated with slight twisting of the mesentery. Findings are concerning for small bowel obstruction due to volvulus or closed loop obstruction. 5. Moderate free fluid over the pelvis. 6. Moderate prostatic enlargement with moderate impression upon the bladder base unchanged. 7. Aortic atherosclerosis. Atherosclerotic coronary artery disease. Aortic Atherosclerosis (ICD10-I70.0) and Emphysema (ICD10-J43.9). Currently patient provider. Electronically Signed: By: Toribio Agreste M.D. On: 09/24/2024 14:04  DG Abd 1 View Result Date: 09/24/2024 EXAM: 1 VIEW XRAY OF THE ABDOMEN 09/24/2024 08:39:55 AM COMPARISON: KUB dated 09/18/2024. CLINICAL HISTORY: [id number] Bloating [id number] Bloating FINDINGS: LINES, TUBES AND DEVICES: There is a feeding tube within the stomach, which has its tip in the distal stomach. BOWEL: There is diffuse gastric distention of the small and large bowels. SOFT TISSUES: No opaque urinary calculi. BONES: No acute osseous abnormality. IMPRESSION: 1. Diffuse gastric distention of the small and large bowels. No bowel obstruction. Electronically signed by: Evalene Coho MD 09/24/2024 08:49 AM EST RP Workstation: [id number]   DG CHEST PORT 1 VIEW Result Date: 09/24/2024 EXAM: 1 VIEW(S) XRAY OF THE CHEST 09/24/2024 08:39:55 AM COMPARISON: AP radiograph of the chest dated 09/21/2024. CLINICAL HISTORY: Shortness of breath. FINDINGS: LINES, TUBES AND DEVICES: A feeding tube is present with  its tip in the distal stomach. LUNGS AND PLEURA: There is mild atelectasis in the left lung base. No pleural effusion. No pneumothorax. HEART AND MEDIASTINUM: No acute abnormality of the cardiac and mediastinal silhouettes. BONES AND SOFT TISSUES: No acute osseous abnormality. IMPRESSION: 1. Mild atelectasis in the left lung base. Electronically signed by: Evalene Coho MD 09/24/2024 08:48 AM EST RP Workstation: [id number]   DG Chest 1 View Result Date: 09/21/2024 EXAM: 1 VIEW(S) XRAY OF THE CHEST 09/21/2024 06:47:00 PM COMPARISON: 09/08/2024 CLINICAL HISTORY: Cough FINDINGS: LINES, TUBES AND DEVICES: Feeding tube in place coursing below the diaphragm with tip beyond field-of-view. LUNGS AND PLEURA: Low lung volumes accentuated interstitial markings. Chronic interstitial coarsening similar to 09/08/2024. No focal pulmonary opacity. No pleural effusion. No pneumothorax. HEART AND MEDIASTINUM: Atherosclerotic calcifications of aortic arch. No acute abnormality of the cardiac silhouette. BONES AND SOFT TISSUES: No acute osseous abnormality. IMPRESSION: 1. Low lung volumes. No acute cardiopulmonary process. Electronically signed by: Norman Gatlin MD 09/21/2024 07:49 PM EST RP Workstation: [id number]   DG Abd 1 View Result Date: 09/18/2024 CLINICAL DATA:  Ileus. EXAM: ABDOMEN - 1 VIEW COMPARISON:  Abdominal radiograph dated 09/17/2024. FINDINGS: Enteric tube with tip projecting over the lower abdomen, likely in the duodenum. No bowel dilatation or evidence of obstruction. No free air or radiopaque calculi. Osteopenia with degenerative changes of spine. No acute osseous pathology. IMPRESSION: Enteric tube with tip projecting over the lower abdomen, likely in the duodenum. Electronically Signed   By: Vanetta Chou M.D.   On: 09/18/2024 11:19   DG Abd 1 View Result Date: 09/17/2024 CLINICAL DATA:  Small bowel obstruction. EXAM: DG ABDOMEN 1V COMPARISON:  Lymph 725. FINDINGS: Gaseous prominence of small  bowel and colon. Gas in the rectum. Feeding tube terminates in the distal stomach. IMPRESSION: Ileus. Electronically Signed   By: Newell Eke M.D.   On: 09/17/2024 15:48   US  EKG SITE RITE Result Date: 09/16/2024 If Site Rite image not attached, placement could not be confirmed due to current cardiac rhythm.  DG Abd Portable 1V Result Date: 09/15/2024 EXAM: 1 VIEW XRAY OF THE ABDOMEN 09/15/2024 11:40:00 AM COMPARISON: 7 days ago. Feeding tube tip is seen in proximal stomach. CLINICAL HISTORY: Encounter for feeding tube placement. FINDINGS: LINES, TUBES AND DEVICES: Feeding tube tip is seen in proximal stomach. BOWEL: Nonobstructive bowel gas pattern. SOFT TISSUES: No opaque urinary calculi. BONES: No acute osseous abnormality. IMPRESSION: 1. Feeding tube tip in the proximal stomach. Electronically signed by: Lynwood Seip MD 09/15/2024 12:47 PM EST RP Workstation: [id number]   MR BRAIN WO CONTRAST Result Date: 09/13/2024 EXAM: MRI BRAIN WITHOUT CONTRAST 09/13/2024 04:16:00 PM TECHNIQUE: Multiplanar multisequence MRI  of the head/brain was performed without the administration of intravenous contrast. COMPARISON: Head CT and CTA 09/13/2024 and MRI 09/09/2024. CLINICAL HISTORY: Neuro deficit, acute, stroke suspected. FINDINGS: The examination is intermittently up to moderately motion degraded. BRAIN AND VENTRICLES: Numerous patchy areas of cortical and subcortical T2 hyperintensity and diffusion weighted signal hyperintensity are again seen scattered throughout both cerebral hemispheres. Some areas appear mildly progressive on diffusion weighted imaging, such as small regions of restricted diffusion involving cortex and subcortical white matter in the posterior left frontal lobe and left corona radiata, while the signal changes on T2 and FLAIR sequences are stable to at most minimally increased. Gyral intrinsic T1 hyperintensity is again noted along the right parietooccipital sulcus. A small amount of  susceptibility within the left postcentral sulcus corresponds to subarachnoid hemorrhage which is new from the prior MRI but was seen on today's earlier CT. There is mild to moderate cerebral atrophy. Background chronic small vessel ischemia is noted in the cerebral white matter. No midline shift, hydrocephalus, or extra axial fluid collection is identified. Major intracranial vascular flow voids are preserved. ORBITS: Bilateral cataract extraction. SINUSES AND MASTOIDS: No acute abnormality. BONES AND SOFT TISSUES: Normal marrow signal. No acute soft tissue abnormality. IMPRESSION: 1. Prominent patchy cortical and subcortical signal abnormality throughout both cerebral hemispheres, overall slightly progressed from 09/09/2024 and indeterminate although encephalitis and vasculitis remain leading considerations. 2. Small volume left parietal subarachnoid hemorrhage. Electronically signed by: Dasie Hamburg MD 09/13/2024 04:35 PM EST RP Workstation: [id number]   EEG adult Result Date: 09/13/2024 Burnett Arlin KIDD, MD     09/13/2024  2:58 PM Patient Name: Noah Burnett MRN: [id number] Epilepsy Attending: Arlin KIDD Burnett Referring Physician/Provider: Remi Pippin, NP Date: 09/13/2024 Duration: 24.05 mins Patient history: 83 year old male with altered mental status.  EEG evaluate for seizure. Level of alertness: Awake AEDs during EEG study: None Technical aspects: This EEG study was done with scalp electrodes positioned according to the 10-20 International system of electrode placement. Electrical activity was reviewed with band pass filter of 1-70Hz , sensitivity of 7 uV/mm, display speed of 36mm/sec with a 60Hz  notched filter applied as appropriate. EEG data were recorded continuously and digitally stored.  Video monitoring was available and reviewed as appropriate. Description: EEG showed continuous generalized 3 to 6 Hz theta-delta slowing admixed with 12-15hz  beta activity. Hyperventilation and photic stimulation  were not performed.   ABNORMALITY - Continuous slow, generalized IMPRESSION: This study is suggestive of generalized cerebral dysfunction (encephalopathy). No seizures or epileptiform discharges were seen throughout the recording. Noah Burnett   CT ANGIO HEAD NECK W WO CM (CODE STROKE) Result Date: 09/13/2024 EXAM: CTA HEAD AND NECK WITHOUT AND WITH 09/13/2024 10:29:28 AM TECHNIQUE: CTA of the head and neck was performed without and with the administration of 75 mL of iohexol  (OMNIPAQUE ) 350 MG/ML injection. Multiplanar 2D and/or 3D reformatted images are provided for review. Automated exposure control, iterative reconstruction, and/or weight based adjustment of the mA/kV was utilized to reduce the radiation dose to as low as reasonably achievable. Stenosis of the internal carotid arteries measured using NASCET criteria. COMPARISON: Code stroke head CT 09/13/2024 (reported separately), recent CTA head and neck 09/10/2024. CLINICAL HISTORY: 83 year old male with acute neuro deficit, stroke suspected. FINDINGS: CTA NECK: AORTIC ARCH AND ARCH VESSELS: Tortuous aortic arch with mild forage calcified atherosclerosis. 3 vessel arch configuration. No dissection or arterial injury. No significant stenosis of the brachiocephalic or subclavian arteries. Proximal subclavian arteries are stable with tortuosity, no stenosis. CERVICAL  CAROTID ARTERIES: Right carotid artery remains patent to the skull base with stable atherosclerosis at the right ICA origin less than 50% stenosis. Left carotid artery remains patent to the skull base with stable atherosclerosis at the left ICA origin and bulb, narrowing the vessel to 1.9 mm versus 5 mm normally, resulting in 62% stenosis. No dissection or arterial injury. CERVICAL VERTEBRAL ARTERIES: Dominant left vertebral artery is patent and normal to the skull base. Nondominant right vertebral artery is patent, with stable mild stenosis at its origin. Stable mild irregularity and  stenosis of the dominant left vertebral artery as it crosses the dura, mild left V4 irregularity. Patent left PICA origin. The left vertebral primarily supplies the basilar artery as before. Distal right vertebral artery is stable with mild irregularity and stenosis, patent right PICA origin. No dissection or arterial injury. LUNGS AND MEDIASTINUM: Centrilobular emphysema. Negative visible superior mediastinum. SOFT TISSUES: Negative nonvascular neck soft tissue spaces. No acute abnormality. BONES: Absent dentition. Exaggerated cervical lordosis and upper thoracic kyphosis. No acute abnormality. CTA HEAD: ANTERIOR CIRCULATION: Bilateral ICA siphons remain patent. Left siphon moderate calcified plaque and small supraclinoid left ICA 2 to 3 mm aneurysm (series 15 image 109) are stable. Only mild left siphon stenosis. Right ICA siphon moderate calcified plaque and mild to moderate supraclinoid right ICA stenosis are stable. MCA and ACA origins remain patent. Nondominant left A1 segment redemonstrated. Dominant left ACA A2 segment redemonstrated. Bilateral ACA branches are stable. Left MCA M1 segment and bifurcation remain patent without stenosis. Right MCA M1 segment and bifurcation remain patent without stenosis. Bilateral MCA branches are stable. No other aneurysm identified in the anterior circulation. POSTERIOR CIRCULATION: Basilar artery remains patent. SCA and PCA origins are patent. Bilateral PCA branches remain patent, mild to moderate left P2 segment irregularity does not appear significantly changed. No other aneurysm identified in the posterior circulation. OTHER: Early venous contrast timing on this exam, major dural venous sinuses appear grossly patent. Preliminary Report of this exam discussed by telephone with Neurology provider [facility] at 1035 hours. IMPRESSION: 1. Stable CTA head and neck since 09/10/24 with no large vessel occlusion. 2. Stable atherosclerosis at the left ICA origin and bulb with 62%  stenosis. 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 And stable less pronounced atherosclerosis elsewhere. 3. Emphysema. Electronically signed by: Helayne Hurst MD 09/13/2024 10:45 AM EST RP Workstation: [id number]   CT HEAD CODE STROKE WO CONTRAST Result Date: 09/13/2024 EXAM: CT HEAD WITHOUT CONTRAST 09/13/2024 10:14:31 AM TECHNIQUE: CT of the head was performed without the administration of intravenous contrast. Automated exposure control, iterative reconstruction, and/or weight based adjustment of the mA/kV was utilized to reduce the radiation dose to as low as reasonably achievable. COMPARISON: Brain MRI 09/09/2024, Head CT 09/10/2024. CLINICAL HISTORY: 82 year old male with acute neuro deficit, stroke suspected. FINDINGS: BRAIN AND VENTRICLES: Trace new left superior convexity subarachnoid blood, 1 to 2 sulci affected and best seen on series 6 image 39 sagittal images. No progressive edema there, no mass effect. No other acute intracranial hemorrhage. No convincing cortically based infarct by CT. No hydrocephalus. No extra-axial collection. No mass effect or midline shift. Stable brain volume. Patchy and confluent, mild to moderate periventricular white matter hypodensity has not significantly changed. Asymmetric right superior temporal gyrus white matter hypodensity also stable to recent exams. Calcified atherosclerosis. No suspicious intracranial vascular hyperdensity. ORBITS: No acute abnormality. Mild leftward gaze. SINUSES: Paranasal sinuses, middle ears and mastoids remain well aerated. SOFT TISSUES AND SKULL: No acute  soft tissue abnormality. No skull fracture. [place] stroke program early CT score (aspects) ----- Ganglionic (caudate, ic, lentiform nucleus, insula, M1-m3): 7 Supraganglionic (m4-m6): 3 Total: 10 IMPRESSION: 1. Trace new left superior convexity subarachnoid hemorrhage (12 sulci), without mass effect. 2. Otherwise stable non contrast CT appearance of the brain: Nonspecific white  matter changes, No convincing cortically based infarct by CT. ASPECTS 10. 3. These results were communicated to Dr. Lindzen at 10:23 hours on 09/13/2024 by text page via the [facility] messaging system. Electronically signed by: Helayne Hurst MD 09/13/2024 10:26 AM EST RP Workstation: [id number]   ECHOCARDIOGRAM COMPLETE Result Date: 09/10/2024    ECHOCARDIOGRAM REPORT   Patient Name:   Noah Burnett Date of Exam: 09/10/2024 Medical Rec #:  [id number]        Height:       67.0 in Accession #:    [id number]       Weight:       100.5 lb Date of Birth:  1941/07/11        BSA:          1.510 m Patient Age:    83 years         BP:           112/57 mmHg Patient Gender: M                HR:           59 bpm. Exam Location:  Inpatient Procedure: 2D Echo, 3D Echo, Cardiac Doppler, Color Doppler and Strain Analysis            (Both Spectral and Color Flow Doppler were utilized during            procedure). Indications:    Stroke  History:        Patient has no prior history of Echocardiogram examinations.                 Risk Factors:Hypertension.  Sonographer:    Philomena Daring Referring Phys: [id number] ASHISH ARORA  Sonographer Comments: Global longitudinal strain was attempted. IMPRESSIONS  1. Left ventricular ejection fraction, by estimation, is 55%. Left ventricular ejection fraction by 3D volume is 52 %. The left ventricle has normal function. The left ventricle has no regional wall motion abnormalities. Left ventricular diastolic parameters are consistent with Grade I diastolic dysfunction (impaired relaxation). The average left ventricular global longitudinal strain is -13.3 %.  2. Right ventricular systolic function is normal. The right ventricular size is normal. Tricuspid regurgitation signal is inadequate for assessing PA pressure.  3. The mitral valve is normal in structure. Trivial mitral valve regurgitation. No evidence of mitral stenosis.  4. The aortic valve is tricuspid. Aortic valve regurgitation is mild. No aortic  stenosis is present.  5. The inferior vena cava is normal in size with greater than 50% respiratory variability, suggesting right atrial pressure of 3 mmHg. Comparison(s): No prior Echocardiogram. FINDINGS  Left Ventricle: Left ventricular ejection fraction, by estimation, is 55%. Left ventricular ejection fraction by 3D volume is 52 %. The left ventricle has normal function. The left ventricle has no regional wall motion abnormalities. The average left ventricular global longitudinal strain is -13.3 %. Strain was performed and the global longitudinal strain is indeterminate. The left ventricular internal cavity size was normal in size. There is no left ventricular hypertrophy. Left ventricular diastolic parameters are consistent with Grade I diastolic dysfunction (impaired relaxation). Normal left ventricular filling pressure. Right Ventricle: The right ventricular size is  normal. No increase in right ventricular wall thickness. Right ventricular systolic function is normal. Tricuspid regurgitation signal is inadequate for assessing PA pressure. Left [facility]: Left atrial size was normal in size. Right [facility]: Right atrial size was normal in size. Pericardium: There is no evidence of pericardial effusion. Mitral Valve: The mitral valve is normal in structure. Trivial mitral valve regurgitation. No evidence of mitral valve stenosis. Tricuspid Valve: The tricuspid valve is normal in structure. Tricuspid valve regurgitation is not demonstrated. No evidence of tricuspid stenosis. Aortic Valve: The aortic valve is tricuspid. Aortic valve regurgitation is mild. No aortic stenosis is present. Pulmonic Valve: The pulmonic valve was not well visualized. Pulmonic valve regurgitation is not visualized. No evidence of pulmonic stenosis. Aorta: The aortic root and ascending aorta are structurally normal, with no evidence of dilitation. Venous: The inferior vena cava is normal in size with greater than 50% respiratory variability,  suggesting right atrial pressure of 3 mmHg. IAS/Shunts: No atrial level shunt detected by color flow Doppler. Additional Comments: 3D was performed not requiring image post processing on an independent workstation and was abnormal.  LEFT VENTRICLE PLAX 2D LVIDd:         4.00 cm         Diastology LVIDs:         2.80 cm         LV e' medial:  4.57 cm/s LV PW:         1.10 cm         LV e' lateral: 7.94 cm/s LV IVS:        1.10 cm LVOT diam:     2.00 cm         2D Longitudinal LV SV:         59              Strain LV SV Index:   39              2D Strain GLS   -13.3 % LVOT Area:     3.14 cm        Avg:                                 3D Volume EF                                LV 3D EF:    Left                                             ventricul                                             ar                                             ejection  fraction                                             by 3D                                             volume is                                             52 %.                                 3D Volume EF:                                3D EF:        52 %                                LV EDV:       84 ml                                LV ESV:       40 ml                                LV SV:        43 ml RIGHT VENTRICLE             IVC RV Basal diam:  2.80 cm     IVC diam: 1.20 cm RV Mid diam:    2.50 cm RV S prime:     12.50 cm/s TAPSE (M-mode): 1.6 cm LEFT ATRIUM             Index        RIGHT ATRIUM          Index LA diam:        3.40 cm 2.25 cm/m   RA Area:     9.48 cm LA Vol (A2C):   35.1 ml 23.25 ml/m  RA Volume:   14.90 ml 9.87 ml/m LA Vol (A4C):   37.9 ml 25.11 ml/m LA Biplane Vol: 38.1 ml 25.24 ml/m  AORTIC VALVE LVOT Vmax:   95.40 cm/s LVOT Vmean:  61.300 cm/s LVOT VTI:    0.188 m  AORTA Ao Root diam: 3.20 cm Ao Asc diam:  3.00 cm MITRAL VALVE MV Area (PHT): 2.48 cm    SHUNTS MV Decel Time: 306 msec    Systemic  VTI:  0.19 m MV A velocity: 84.40 cm/s  Systemic Diam: 2.00 cm Vishnu Priya Mallipeddi Electronically signed by Diannah Late Mallipeddi Signature Date/Time: 09/10/2024/4:39:28 PM    Final    DG FL GUIDED LUMBAR PUNCTURE Result Date: 09/10/2024 CLINICAL DATA:  83 year old male with a history of progressively worsening confusion, lethargy, and decreasing oral intake. Noticed to have worsening generalized  declined following shingles outbreak. Request for lumbar puncture to rule out HSV encephalitis. EXAM: LUMBAR PUNCTURE UNDER FLUOROSCOPY PROCEDURE: An appropriate skin entry site was determined fluoroscopically. Operator donned sterile gloves and mask. Skin site was marked, then prepped with Betadine, draped in usual sterile fashion, and infiltrated locally with 1% lidocaine . A 20 gauge spinal needle advanced into the thecal sac at L5-S1 from a right interlaminar approach. Clear colorless CSF spontaneously returned, with opening pressure of 13 cm water. 15 ml CSF were collected and divided among 4 sterile vials for the requested laboratory studies. The needle was then removed. The patient tolerated the procedure well and there were no complications. FLUOROSCOPY: Radiation Exposure Index (as provided by the fluoroscopic device): 2.7 mGy Kerma IMPRESSION: Technically successful lumbar puncture under fluoroscopy. This exam was performed by [facility] PA-C, and was supervised and interpreted by Dr. Ree Molt. Electronically Signed   By: Ree Molt M.D.   On: 09/10/2024 14:00   EEG adult Result Date: 09/10/2024 Burnett Arlin KIDD, MD     09/10/2024  7:23 AM Patient Name: Noah Burnett MRN: [id number] Epilepsy Attending: Arlin KIDD Burnett Referring Physician/Provider: Voncile Isles, MD Date: 09/10/2024 Duration: 25.59 mins Patient history: 83yo M with ams. EEG to evaluate for seizure Level of alertness: Awake, asleep AEDs during EEG study: None Technical aspects: This EEG study was done with scalp electrodes  positioned according to the 10-20 International system of electrode placement. Electrical activity was reviewed with band pass filter of 1-70Hz , sensitivity of 7 uV/mm, display speed of 53mm/sec with a 60Hz  notched filter applied as appropriate. EEG data were recorded continuously and digitally stored.  Video monitoring was available and reviewed as appropriate. Description: The posterior dominant rhythm consists of 8-9Hz  activity of moderate voltage (25-35 uV) seen predominantly in posterior head regions, symmetric and reactive to eye opening and eye closing. Sleep was characterized by vertex waves, sleep spindles (12 to 14 Hz), maximal frontocentral region. There is intermittent generalized 3 to 6 Hz theta-delta slowing. Hyperventilation and photic stimulation were not performed.   ABNORMALITY - Intermittent slow, generalized IMPRESSION: This study is suggestive of diffuse cerebral dysfunction (encephalopathy). No seizures or epileptiform discharges were seen throughout the recording. Noah Burnett   CT ANGIO HEAD NECK W WO CM Result Date: 09/10/2024 EXAM: CTA Head and Neck with Intravenous Contrast. CT Head without Contrast. CLINICAL HISTORY: Neuro deficit, acute, stroke suspected. TECHNIQUE: Axial CTA images of the head and neck performed with intravenous contrast. MIP reconstructed images were created and reviewed. Axial computed tomography images of the head/brain performed without intravenous contrast. Note: Per PQRS, the description of internal carotid artery percent stenosis, including 0 percent or normal exam, is based on [employer] American Symptomatic Carotid Endarterectomy Trial (NASCET) criteria. Dose reduction technique was used including one or more of the following: automated exposure control, adjustment of mA and kV according to patient size, and/or iterative reconstruction. CONTRAST: Without and with; 75 mL (iohexol  (OMNIPAQUE ) 350 MG/ML injection 75 mL IOHEXOL  350 MG/ML SOLN). COMPARISON: MRI  from 09/09/2024 and CT from 09/08/2024. FINDINGS: CT HEAD: BRAIN: Changes of atrophy with chronic microvascular ischemic disease noted. Previously identified parenchymal changes are not well visualized by CT. No acute intracranial hemorrhage. No further acute large vessel territory infarct. No mass lesion or midline shift. No hydrocephalus or extra-axial fluid collection. Calcified atherosclerosis about the skull base. VENTRICLES: No hydrocephalus. ORBITS: Prior bilateral ocular lens replacement. SINUSES AND MASTOIDS: The paranasal sinuses and mastoid air cells are clear. CTA NECK: COMMON  CAROTID ARTERIES: Atherosclerotic change about the right carotid bulb with 45% stenosis by NASCET criteria. Atherosclerotic change about the left carotid bulb with up to 55% by NASCET criteria. No dissection or occlusion. INTERNAL CAROTID ARTERIES: Atheromatous change present about the carotid siphons bilaterally, right worse than left. There is resultant moderate stenosis at the periclinoid right ICA (series 11, image 108). No significant stenosis about the left siphon. 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 No dissection or occlusion. VERTEBRAL ARTERIES: Both vertebral arteries were also noted. Left vertebral artery dominant. Atherosclerotic plaque at the origin of the right vertebral artery without significant stenosis. No dissection or occlusion. SUBCLAVIAN ARTERIES: Subclavian arteries noted. CTA HEAD: ANTERIOR CEREBRAL ARTERIES: A1 segments patent bilaterally. Normal anterior communicating artery complex. Both ACAs patent without significant stenosis. No occlusion. No aneurysm. MIDDLE CEREBRAL ARTERIES: No M1 stenosis. No proximal MCA branch occlusion or high-grade stenosis. Distal middle cerebral branches perfusion is symmetric. No significant beading or irregularity. No occlusion. No aneurysm. POSTERIOR CEREBRAL ARTERIES: Atheromatous irregularity about  the V4 segments without significant stenosis. Both PICAs patent. Basilar patent without stenosis. Superior cerebellar and posterior cerebral arteries patent bilaterally. No beading or irregularity. No occlusion. No aneurysm. BASILAR ARTERY: Basilar patent without stenosis. No occlusion. No aneurysm. OTHER: SOFT TISSUES: No acute finding. No masses or lymphadenopathy. Upper lobe dominant centrilobular emphysema. 8 mm irregular pulmonary nodule present at the right upper lobe (series 7, image 21). Mild aortic atherosclerosis. BONES: No worrisome osseous lesions. Exaggeration of the normal upper thoracic kyphosis. Patient is edentulous. Calcified atherosclerosis about the skull base. Major dural sinuses are grossly patent, allowing for timing of contrast bolus. IMPRESSION: 1. Stable head CT. Previously identified parenchymal changes identified on prior MRI are not well visualized by CT. No other acute intracranial abnormality. 2. Negative CTA for large vessel occlusion or other emergent findings. No CTA evidence for active vasculitis. 3. Atherosclerotic disease about the carotid bifurcations bilaterally with 45% stenosis on the right and 55% stenosis on the left. 4. Moderate atheromatous stenosis at the paraclinoid right ICA. 5. 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 6. Emphysema. 7. 8 mm irregular right upper lobe pulmonary nodule detected on incomplete chest nation CT. Follow-up examination with prompt chest CT recommended for further evaluation. Electronically signed by: Morene Hoard MD 09/10/2024 01:24 AM EDT RP Workstation: [id number]   MR BRAIN W WO CONTRAST Result Date: 09/09/2024 EXAM: MRI BRAIN WITH AND WITHOUT CONTRAST 09/09/2024 09:54:31 AM TECHNIQUE: Multiplanar multisequence MRI of the head/brain was performed with and without the administration of 5 mL gadobutrol (GADAVIST) 1 MMOL/ML intravenous contrast. COMPARISON: None available. CLINICAL HISTORY: Mental status change, unknown  cause; confusion in patient with hx of shingles one month ago. FINDINGS: BRAIN AND VENTRICLES: There are small areas of diffusion abnormality at the anterior left insula, both frontal opercula, and in multiple locations of both corona radiata. Some of the lesions appear more acute than others based on lower ADC values. Multifocal hyperintense T2-weighted signal within the cerebral white matter, most commonly due to chronic small vessel disease. There are additional areas of white matter hyperintense T2-weighted signal with a more focal and rounded appearance than expected with the setting of chronic small vessel disease. Some of these areas also show mild contrast enhancement. Intrinsic hyperintense T1-weighted signal at the right parietooccipital sulcus. On the coronal T1-weighted postcontrast sequence, there is multifocal subcortical contrast enhancement greatest at the right parietooccipital fissure but present at multiple subcortical  locations in both hemispheres. No acute intracranial hemorrhage. No mass effect or midline shift. No hydrocephalus. The sella is unremarkable. Mild volume loss. Normal flow voids. ORBITS: OK No acute abnormality. SINUSES: No acute abnormality. BONES AND SOFT TISSUES: Normal bone marrow signal and enhancement. No acute soft tissue abnormality. IMPRESSION: 1. Multifocal subcortical contrast enhancement, greatest at the right parietooccipital fissure but present at multiple subcortical locations in both hemispheres. 2. Small areas of diffusion abnormality at the anterior left insula, left frontal operculum, right frontal operculum, and in multiple locations of both corona radiata, with some lesions appearing more acute than others based on lower ADC values. 3. There are multiple possibilities raised by the above findings. Leading considerations are infectious/inflammatory encephalitis or vasculitis, and CSF sampling should be considered. Repeated embolic showers with scattered infarcts  of different ages is also possible. Demyelinating disease is possible, but less likely. Electronically signed by: Franky Stanford MD 09/09/2024 11:32 AM EDT RP Workstation: [id number]   US  EKG SITE RITE Result Date: 09/08/2024 If Site Rite image not attached, placement could not be confirmed due to current cardiac rhythm.  CT Head Wo Contrast Result Date: 09/08/2024 EXAM: CT HEAD WITHOUT CONTRAST 09/08/2024 02:16:00 PM TECHNIQUE: CT of the head was performed without the administration of intravenous contrast. Automated exposure control, iterative reconstruction, and/or weight based adjustment of the mA/kV was utilized to reduce the radiation dose to as low as reasonably achievable. COMPARISON: 08/29/2024 CLINICAL HISTORY: Head trauma, minor (Age >= 65y). FINDINGS: BRAIN AND VENTRICLES: No acute hemorrhage. No evidence of acute infarct. No hydrocephalus. No extra-axial collection. No mass effect or midline shift. Age-related volume loss. Mild to moderate periventricular and deep cerebral white matter hypodensities, likely chronic small vessel ischemic disease. ORBITS: Status post bilateral lens replacement. SINUSES: No acute abnormality. SOFT TISSUES AND SKULL: No acute soft tissue abnormality. No skull fracture. VASCULATURE: Moderate vascular calcifications. IMPRESSION: 1. No acute intracranial abnormality. 2. Age-related volume loss. 3. Mild to moderate periventricular and deep cerebral white matter hypodensities, likely chronic small vessel ischemic disease. Electronically signed by: Franky Stanford MD 09/08/2024 03:03 PM EDT RP Workstation: [id number]   DG Abdomen Acute W/Chest Result Date: 09/08/2024 EXAM: [employer] XRAY VIEWS OF THE ABDOMEN AND _VIEWS_ VIEW(S) OF THE CHEST 09/08/2024 12:19:28 PM COMPARISON: 06/20/2024 CLINICAL HISTORY: obstruction FINDINGS: LUNGS AND PLEURA: No consolidation or pulmonary edema. No pleural effusion or pneumothorax. HEART AND MEDIASTINUM: No acute abnormality of  the cardiac and mediastinal silhouettes. BOWEL: The bowel gas pattern is nonspecific. No bowel obstruction. PERITONEUM AND SOFT TISSUES: No abnormal calcifications. No free air. BONES: No acute osseous abnormality. IMPRESSION: 1. No radiographic evidence of bowel obstruction or pneumoperitoneum. Electronically signed by: Lynwood Seip MD 09/08/2024 12:44 PM EDT RP Workstation: [id number]   CT Head Wo Contrast Result Date: 08/29/2024 EXAM: CT HEAD WITHOUT CONTRAST 08/29/2024 07:59:42 PM TECHNIQUE: CT of the head was performed without the administration of intravenous contrast. Automated exposure control, iterative reconstruction, and/or weight based adjustment of the mA/kV was utilized to reduce the radiation dose to as low as reasonably achievable. COMPARISON: 03/27/2018 CLINICAL HISTORY: Mental status change, unknown cause. Worsening confusion, AMS, weakness, and fatigue, reportedly beginning after a Shingles infection. FINDINGS: BRAIN AND VENTRICLES: No acute hemorrhage. No evidence of acute infarct. No hydrocephalus. No extra-axial collection. No mass effect or midline shift. Age-related cerebral volume loss and mild periventricular and deep cerebral white matter disease. Moderate calcific atheromatous disease within the carotid siphons. ORBITS: No acute abnormality. Bilateral lens replacement noted. SINUSES: No acute  abnormality. SOFT TISSUES AND SKULL: No acute soft tissue abnormality. No skull fracture. IMPRESSION: 1. No acute intracranial abnormality. Electronically signed by: Norman Gatlin MD 08/29/2024 08:05 PM EDT RP Workstation: [id number]    Labs:  Basic Metabolic Panel: Recent Labs  Lab 09/18/24 0254 09/18/24 0255 09/19/24 0159 09/20/24 0140 09/21/24 0501 09/21/24 1955 09/22/24 0526 09/23/24 0430 09/24/24 0535  NA 124*  --  123* 123* 124*  --  132* 130* 130*  K 3.2*  --  3.5 3.2* 3.3*  --  4.8 3.9 3.9  CL 94*  --  92* 90* 90*  --  100 97* 93*  CO2 21*  --  20* 22 25  --  22 23 25    GLUCOSE 164*  --  196* 183* 176*  --  136* 128* 167*  BUN 13  --  14 16 30*  --  23 18 20   CREATININE 0.53*  --  0.57* 0.46* 0.54* 0.57* 0.53* 0.48* 0.56*  CALCIUM 7.4*  --  7.1* 7.0* 6.6*  --  7.1* 7.1* 7.3*  MG  --  1.6* 2.0  --   --   --   --   --   --   PHOS 2.7 2.6 2.0* <1.0* 2.2*  --   --   --   --     CBC: Recent Labs  Lab 09/22/24 0526  WBC 9.6  NEUTROABS 8.3*  HGB 12.5*  HCT 36.3*  MCV 88.8  PLT 208    CBG: Recent Labs  Lab 09/23/24 2110 09/24/24 0018 09/24/24 0413 09/24/24 0816 09/24/24 0901  GLUCAP 156* 173* 156* 176* 186*    Brief HPI:   Noah Burnett is a 83 y.o. 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  He had an EGD that showed hiatal hernia and compression of the second portion of duodenum with possible intraluminal neoplasm.  Biopsies were obtained which showed severely inflamed and ulcerated duodenal mucosa, but no evidence of malignancy.  A second EGD was done with ultrasound guidance but again pathology of the lesion extensive necrosis.  Surgical oncology consulted underwent robotic gastrojejunostomy on 8/25.  He did have delayed return of bowel function and a PICC line with TPN for several days until he had advancement of his diet.  Since his procedure he had had fairly steady decline with decreasing appetite significant weight loss and inability to walk.  He also had follow-up surgical oncology.  He had a repeat MRI which did not show the duodenal mass.  Per chart review patient lives alone 1 level home one-step to entry.  He has required assistance from his family for ADLs and use of a rolling walker for mobility the past 2 weeks and experienced 1 fall in the past 2 weeks.  Presented  09/08/2024 to [facility] with altered mental status and unwitnessed fall as well as reports of generalized weakness and fatigue.  In the ED abdominal x-ray showed no evidence of obstruction.  Cranial CT scan negative for acute changes.  MRI showed multifocal subcortical contrast-enhancement, greatest at the right parietal occipital fissure but present at multiple subcortical locations in both hemispheres.  Small areas of diffusion abnormality at the anterior left insula, left frontal operculum, right frontal operculum, and in multiple locations of  both corona radiata and some lesions appearing more acute than others based on lower ADC values.  There were multiple possibilities raised by above findings consideration infectious versus inflammatory encephalitis versus vasculitis in CSF.  Patient was transferred to [facility] for [facility] evaluation.  Admission chemistries unremarkable except sodium 128 potassium 3.4 chloride 90 AST 14 total CK of 30 TSH 4.63 2 AM cortisol 30 serum osmolality 257 urine sodium 137 urine osmolality 490.  Neurology consulted for evaluation of acute encephalopathy as well as nephrology consulted for chronic hyponatremia.  Patient underwent fluoroscopic guided lumbar puncture fluid sent for analysis for evaluation of meningitis encephalitis.  Procalcitonin negative, CRP 4.1.  EEG showed diffuse cerebral dysfunction no seizure seen throughout the recording.  VZV PCR was positive, varicella IgG negative.  On the morning of 09/13/2024 patient noted to have left gaze preference and decreased responsiveness code stroke was called patient was seen by neurology services.  Stat CT scan was done which showed left superior convexity subarachnoid hemorrhage without mass effect.  Otherwise no changes from previous CT.  CT angiogram ruled out large vessel occlusion.  Patient initially kept n.p.o. follow-up speech therapy.  Eventually MRI completed showed prominent patchy cortical and  subcortical signal abnormality throughout both hemispheres overall slight progressed from 09/09/2024 and intermediate although encephalitis and vasculitis remain leading considerations.  ID recommended continuing acyclovir.  Patient's mental status continued to slowly improve.  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  Patient was also started on high-dose methylprednisolone by neurology 09/14/2024 with plans to continue for 5 days followed by taper. In regards to patient's hyponatremia felt to be chronic in nephrology follow-up possibly SIADH noted sodium in August 2025 was 129 and latest sodium 123-124 and currently remain on sodium chloride  tablets x 7 days and addition of urea  x 6 doses with recent worsening of hyponatremia likely attributed to recent high-dose Solu-Medrol.  The sodium levels had actually worsened with normal saline and IV fluids was required during acyclovir treatment reduced to 25 cc an hour and received a dose of Lasix 09/19/2024 as well as 09/20/2024 and plan to continue Lasix 20 mg p.o. daily Bouts of urinary retention on the evening of 09/13/2024 patient was retaining urine bladder scan for 900 cc orders given for nurse for straight cath however this was unsuccessful so a coud catheter was placed and started on Flomax AKI creatinine jumped from 0.76-1 0.0-2.07 09/14/2024 nephrology had been following at that time given IV fluids AKI presumed to be secondary to obstructive uropathy as well as contrast-induced.  AKI resolved 09/15/2024.  Patient also with bouts of hypophosphatemia supplemented with follow-up chemistries He was cleared to begin Lovenox for DVT prophylaxis. Patient is diet slowly advanced to mechanical soft initially requiring tube feeds for nutritional support.  Palliative care consulted to establish goals of care.  Patient did have some persistent nausea and vomiting KUB  09/17/2024 showing ileus.  Therapy evaluations completed due to patient decreased functional mobility was admitted for a comprehensive rehab program.   Hospital Course: Noah Burnett was admitted to rehab 09/21/2024 for inpatient therapies to consist of PT, ST and OT at least three hours five days a week. Past admission physiatrist, therapy team and rehab RN have worked together to provide customized collaborative inpatient rehab.  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  Patient had been cleared to begin Lovenox  for DVT prophylaxis 09/09/2024 no bleeding episodes.  Hospital course complicated ID encephalitis vasculitis Acyclovir x 14 days per infectious disease.  Patient also started on high-dose methylprednisolone by neurology services 09/14/2024 with 5-day course completed.  Chronic hyponatremia/SIADH as well as hypophosphatemia hypokalemia followed by nephrology services maintained on sodium chloride  tablets 2 g 3 times a day x 7 days as well as the addition of urea  15 mg twice daily x 6 doses and p.o. Lasix low dose of 20 mg with routine follow-up chemistries.  Acute urinary retention as well as history of prostate cancer status post TURP.  Patient unsuccessful straight cath coud catheter placed maintain on Flomax voiding trial as indicated follow-up outpatient.  AKI/resolved follow-up chemistries monitor while on diuretic.  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  Decreased nutritional storage supplemental tube feeds as indicated follow-up dietary services.  Blood pressure remains soft continued Norvasc HCTZ initially held due to soft blood pressures resumed as needed.  Recent diagnosis of shingles September 2025 acyclovir as directed noted above.  Crestor ongoing for hyperlipidemia.  On the midmorning of 09/24/2024 noted oxygen saturations 86%  increasing to respiration rates 20s to low 30s abdomen was distended.  CT angiogram of the chest as well as CT of the abdomen pelvis showed no evidence of pulmonary emboli.  Small to moderate bilateral pleural effusions right worse than left with associated bibasilar posterior airspace density.  Subtle hazy patchy somewhat nodular airspace opacification of the right upper lobe with minimal opacification of the posterior dependent upper lobe.  Evidence of free peritoneal air with multiple mid to distal dilated small bowel loops demonstrating moderate diffuse pneumatosis.  Findings concerning for bowel ischemia.  Significant narrowing of the proximal jejunum as it crosses the midline from left to right just below the level of the aortic bifurcation.  Moderate free air over the pelvis.  Chemistries revealed BNP of 111.6, sodium 130, glucose 167, creatinine 0.56.  Internal medicine consulted for input as well as general surgery as well as rapid response.  Patient's tube feeds were placed on hold and given D5 one half normal saline.  Placed on 5 L of oxygen heart rate variable 39-120.  Blood pressure soft 96 systolic continued IV fluids.  Discussion with general surgery plan was to the OR for ischemic bowel and to evaluate for duodenal mass that was discussed with son.  Patient was discharged to acute care services for ongoing medical management.   Blood pressures were monitored on TID basis and remained soft and monitored     Rehab course: During patient's stay in rehab weekly team conferences were held to monitor patient's progress, set goals and discuss barriers to discharge. At admission, patient required minimal assist contact-guard 170 feet rolling walker minimal assist step pivot transfers  He/She  has had improvement in activity tolerance, balance, postural control as well as ability to compensate for deficits. He/She has had improvement in functional use RUE/LUE  and RLE/LLE as well as improvement in  awareness.  Working with energy conservation techniques.  Perform multiple sit to stand stand pivot transfers with contact-guard minimal assist.  Perform step taps and alternating step taps 3 sets x 10 reps each for left lower extremity.  Ambulate 75 feet x 2 rolling walker and contact-guard minimal assist.  Patient able to complete functional mobility to the gym 120 feet with moderate assist for loss of balance overall contact-guard.       Disposition:  Discharge disposition: [facility] Not Defined        Diet: Soft  Special Instructions: No driving smoking or alcohol  Medications at discharge. 1.  Tylenol  as needed 2.  Norvasc 5 mg p.o. daily 3.  Vitamin B12 500 mg p.o. daily 4.  Lasix 20 mg p.o. daily 5.  Imodium as needed 6.  Multivitamin daily 7.  Omeprazole 20 mg daily 8.  MiraLAX daily hold for loose stools 9.  Senokot 2 tablets daily 10.  Crestor 10 mg p.o. daily 11.  Flomax 0.4 mg daily 12.  Hytrin 1 mg nightly 13.  Dextrose  5% 0.45% NaCl infusion at 60 mL an hour 14.  Megace 400 mg twice daily   30-35 minutes were spent completing discharge summary and discharge planning  Discharge Instructions     Ambulatory referral to Neurology   Complete by: As directed    An appointment is requested in approximately: 4 weeks nontraumatic SAH        Follow-up Information     Emeline Joesph BROCKS, DO Follow up.   Specialty: Physical Medicine and Rehabilitation Why: Office to call for appointment Contact information: [address] [place] [address] [phone]         Melia Lynwood ORN, MD Follow up.   Specialties: Nephrology, Vascular Surgery Why: Call for appointment Contact information: [address] [place] [address] [phone]         Luiz Channel, MD Follow up.   Specialty: Infectious Diseases Why: Call for appointment as needed Contact information: [address] [place]  [address] [phone]         [name], MD Follow up.   Specialty: Internal Medicine Why: Call for appointment Contact information: [address] [place] [address] [phone]                 Signed: Toribio PARAS Irving Bloor 09/24/2024, 3:10 PM

## 2024-09-22 NOTE — Plan of Care (Signed)
  Problem: RH Balance Goal: LTG Patient will maintain dynamic standing with ADLs (OT) Description: LTG:  Patient will maintain dynamic standing balance with assist during activities of daily living (OT)  Flowsheets (Taken 09/22/2024 1542) LTG: Pt will maintain dynamic standing balance during ADLs with: Supervision/Verbal cueing   Problem: RH Bathing Goal: LTG Patient will bathe all body parts with assist levels (OT) Description: LTG: Patient will bathe all body parts with assist levels (OT) Flowsheets (Taken 09/22/2024 1542) LTG: Pt will perform bathing with assistance level/cueing: Supervision/Verbal cueing   Problem: RH Dressing Goal: LTG Patient will perform upper body dressing (OT) Description: LTG Patient will perform upper body dressing with assist, with/without cues (OT). Flowsheets (Taken 09/22/2024 1542) LTG: Pt will perform upper body dressing with assistance level of: Supervision/Verbal cueing Goal: LTG Patient will perform lower body dressing w/assist (OT) Description: LTG: Patient will perform lower body dressing with assist, with/without cues in positioning using equipment (OT) Flowsheets (Taken 09/22/2024 1542) LTG: Pt will perform lower body dressing with assistance level of: Supervision/Verbal cueing   Problem: RH Toileting Goal: LTG Patient will perform toileting task (3/3 steps) with assistance level (OT) Description: LTG: Patient will perform toileting task (3/3 steps) with assistance level (OT)  Flowsheets (Taken 09/22/2024 1542) LTG: Pt will perform toileting task (3/3 steps) with assistance level: Supervision/Verbal cueing   Problem: RH Functional Use of Upper Extremity Goal: LTG Patient will use RT/LT upper extremity as a (OT) Description: LTG: Patient will use right/left upper extremity as a stabilizer/gross assist/diminished/nondominant/dominant level with assist, with/without cues during functional activity (OT) Flowsheets (Taken 09/22/2024 1542) LTG: Use of  upper extremity in functional activities: RUE as dominant level LTG: Pt will use upper extremity in functional activity with assistance level of: Independent with assistive device   Problem: RH Toilet Transfers Goal: LTG Patient will perform toilet transfers w/assist (OT) Description: LTG: Patient will perform toilet transfers with assist, with/without cues using equipment (OT) Flowsheets (Taken 09/22/2024 1542) LTG: Pt will perform toilet transfers with assistance level of: Supervision/Verbal cueing   Problem: RH Tub/Shower Transfers Goal: LTG Patient will perform tub/shower transfers w/assist (OT) Description: LTG: Patient will perform tub/shower transfers with assist, with/without cues using equipment (OT) Flowsheets (Taken 09/22/2024 1542) LTG: Pt will perform tub/shower stall transfers with assistance level of: Supervision/Verbal cueing   Problem: RH Memory Goal: LTG Patient will demonstrate ability for day to day recall/carry over during activities of daily living with assistance level (OT) Description: LTG:  Patient will demonstrate ability for day to day recall/carry over during activities of daily living with assistance level (OT). Flowsheets (Taken 09/22/2024 1542) LTG:  Patient will demonstrate ability for day to day recall/carry over during activities of daily living with assistance level (OT): Supervision

## 2024-09-22 NOTE — Plan of Care (Signed)
  Problem: RH Balance Goal: LTG Patient will maintain dynamic standing balance (PT) Description: LTG:  Patient will maintain dynamic standing balance with assistance during mobility activities (PT) Flowsheets (Taken 09/22/2024 1553) LTG: Pt will maintain dynamic standing balance during mobility activities with:: Supervision/Verbal cueing   Problem: Sit to Stand Goal: LTG:  Patient will perform sit to stand with assistance level (PT) Description: LTG:  Patient will perform sit to stand with assistance level (PT) Flowsheets (Taken 09/22/2024 1553) LTG: PT will perform sit to stand in preparation for functional mobility with assistance level: Supervision/Verbal cueing   Problem: RH Bed Mobility Goal: LTG Patient will perform bed mobility with assist (PT) Description: LTG: Patient will perform bed mobility with assistance, with/without cues (PT). Flowsheets (Taken 09/22/2024 1553) LTG: Pt will perform bed mobility with assistance level of: Supervision/Verbal cueing   Problem: RH Bed to Chair Transfers Goal: LTG Patient will perform bed/chair transfers w/assist (PT) Description: LTG: Patient will perform bed to chair transfers with assistance (PT). Flowsheets (Taken 09/22/2024 1553) LTG: Pt will perform Bed to Chair Transfers with assistance level: Supervision/Verbal cueing   Problem: RH Car Transfers Goal: LTG Patient will perform car transfers with assist (PT) Description: LTG: Patient will perform car transfers with assistance (PT). Flowsheets (Taken 09/22/2024 1553) LTG: Pt will perform car transfers with assist:: Supervision/Verbal cueing   Problem: RH Ambulation Goal: LTG Patient will ambulate in controlled environment (PT) Description: LTG: Patient will ambulate in a controlled environment, # of feet with assistance (PT). Flowsheets (Taken 09/22/2024 1553) LTG: Pt will ambulate in controlled environ  assist needed:: Supervision/Verbal cueing LTG: Ambulation distance in controlled  environment: 150' Goal: LTG Patient will ambulate in home environment (PT) Description: LTG: Patient will ambulate in home environment, # of feet with assistance (PT). Flowsheets (Taken 09/22/2024 1553) LTG: Pt will ambulate in home environ  assist needed:: Supervision/Verbal cueing LTG: Ambulation distance in home environment: 50'   Problem: RH Stairs Goal: LTG Patient will ambulate up and down stairs w/assist (PT) Description: LTG: Patient will ambulate up and down # of stairs with assistance (PT) Flowsheets (Taken 09/22/2024 1553) LTG: Pt will ambulate up/down stairs assist needed:: Supervision/Verbal cueing LTG: Pt will  ambulate up and down number of stairs: 1 threshold step to simulate home entry

## 2024-09-22 NOTE — Progress Notes (Signed)
 [facility] NEPHROLOGY PROGRESS NOTE  Assessment/ Plan: Pt is a 83 y.o. 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  # Acute on chronic hyponatremia likely combination of SIADH in the setting of recent CNS infection.  The recent worsening hyponatremia likely also contributed by high-dose Solu-Medrol and IV fluid. -Noted the patient is moved to rehab.  He is currently off of IV fluid.  Sodium level improved to 132.  I am going to discontinue salt and urea  tablet.  Continue oral Lasix which is home dose. - Monitor lab and follow-up with PCP after discharge.  # Acute kidney injury which is resolved now.  # Hypokalemia: Likely increased urinary excretion of potassium due to polyuria.  I will continue to replete potassium chloride and also manage hypomagnesemia.  # Varicella-zoster viral encephalitis, small vessel stroke, acute metabolic encephalopathy: Currently on  steroid, acyclovir.  # Hypophosphatemia: Required potassium phosphate repletion, encourage oral intake.    Nothing further to add. Sign off, please call us  back with any question.  Subjective: Seen and examined at the bedside.  He was moved to inpatient rehab.  His daughter at the bedside.  He looks frail and weak.  Denies chest pain or shortness of breath.  Objective Vital signs in last 24 hours: Vitals:   09/21/24 1734 09/22/24 0946 09/22/24 1105  BP: (!) 124/90 133/69   Pulse: 97    Resp: 16    Temp: 97.9 F (36.6 C)    TempSrc: Oral    SpO2: 97%    Weight: 47.7 kg  47 kg  Height: 5' 7 (1.702 m)     Weight change:   Intake/Output Summary (Last 24 hours) at 09/22/2024 1308 Last data filed at 09/22/2024 1103 Gross per 24 hour  Intake 348.49 ml  Output 1550 ml  Net -1201.51  ml       Labs: RENAL PANEL Recent Labs  Lab 09/16/24 0314 09/17/24 0130 09/17/24 1046 09/18/24 0254 09/18/24 0255 09/19/24 0159 09/20/24 0140 09/21/24 0501 09/21/24 1955 09/22/24 0526  NA 129* 124*   < > 124*  --  123* 123* 124*  --  132*  K 3.8 3.3*   < > 3.2*  --  3.5 3.2* 3.3*  --  4.8  CL 99 93*   < > 94*  --  92* 90* 90*  --  100  CO2 20* 20*   < > 21*  --  20* 22 25  --  22  GLUCOSE 209* 195*   < > 164*  --  196* 183* 176*  --  136*  BUN 16 15   < > 13  --  14 16 30*  --  23  CREATININE 0.68 0.59*   < > 0.53*  --  0.57* 0.46* 0.54* 0.57* 0.53*  CALCIUM 7.6* 7.3*   < > 7.4*  --  7.1* 7.0* 6.6*  --  7.1*  MG 1.7 1.7  --   --  1.6* 2.0  --   --   --   --   PHOS 2.1* 1.9*  1.9*   < > 2.7 2.6 2.0* <1.0* 2.2*  --   --   ALBUMIN  --  2.3*   < > 2.3*  --  2.4* 2.4* 2.2*  --  2.0*   < > = values in this  interval not displayed.    Liver Function Tests: Recent Labs  Lab 09/20/24 0140 09/21/24 0501 09/22/24 0526  AST  --   --  18  ALT  --   --  26  ALKPHOS  --   --  48  BILITOT  --   --  0.7  PROT  --   --  4.1*  ALBUMIN 2.4* 2.2* 2.0*   No results for input(s): LIPASE, AMYLASE in the last 168 hours. No results for input(s): AMMONIA in the last 168 hours. CBC: Recent Labs    09/09/24 1820 09/10/24 0255 09/12/24 0158 09/13/24 0155 09/14/24 0244 09/15/24 0141 09/22/24 0526  HGB  --    < > 15.5 15.4 13.1 12.9* 12.5*  MCV  --    < > 84.7 85.7 85.0 87.8 88.8  VITAMINB12 267  --   --   --   --   --   --    < > = values in this interval not displayed.    Cardiac Enzymes: No results for input(s): CKTOTAL, CKMB, CKMBINDEX, TROPONINI in the last 168 hours. CBG: Recent Labs  Lab 09/21/24 0040 09/21/24 0415 09/21/24 1129 09/21/24 1540 09/22/24 0555  GLUCAP 200* 191* 169* 169* 129*    Iron Studies: No results for input(s): IRON, TIBC, TRANSFERRIN, FERRITIN in the last 72 hours. Studies/Results: DG Chest 1 View Result Date:  09/21/2024 EXAM: 1 VIEW(S) XRAY OF THE CHEST 09/21/2024 06:47:00 PM COMPARISON: 09/08/2024 CLINICAL HISTORY: Cough FINDINGS: LINES, TUBES AND DEVICES: Feeding tube in place coursing below the diaphragm with tip beyond field-of-view. LUNGS AND PLEURA: Low lung volumes accentuated interstitial markings. Chronic interstitial coarsening similar to 09/08/2024. No focal pulmonary opacity. No pleural effusion. No pneumothorax. HEART AND MEDIASTINUM: Atherosclerotic calcifications of aortic arch. No acute abnormality of the cardiac silhouette. BONES AND SOFT TISSUES: No acute osseous abnormality. IMPRESSION: 1. Low lung volumes. No acute cardiopulmonary process. Electronically signed by: Norman Gatlin MD 09/21/2024 07:49 PM EST RP Workstation: [id number]     Medications: Infusions:  acyclovir (ZOVIRAX) 455 mg in dextrose  5 % 100 mL IVPB 455 mg (09/22/24 1102)    Scheduled Medications:  amLODipine  5 mg Per Tube Daily   [START ON 09/23/2024] calcium carbonate  1,000 mg of elemental calcium Oral Q breakfast   vitamin B-12  500 mcg Per Tube Daily   enoxaparin (LOVENOX) injection  30 mg Subcutaneous Q24H   feeding supplement (KATE FARMS STANDARD ENT 1.4)  325 mL Per Tube BID BM   furosemide  20 mg Per Tube Daily   multivitamin with minerals  1 tablet Per Tube Daily   omeprazole  20 mg Per Tube Daily   polyethylene glycol  17 g Per Tube Daily   potassium & sodium phosphates  1 packet Per Tube TID AC & HS   rosuvastatin  10 mg Per Tube Daily   senna  2 tablet Per Tube Daily   tamsulosin  0.4 mg Oral QPC supper    have reviewed scheduled and prn medications.  Physical Exam: General: Elderly male, frail,  NG tube. Heart:RRR, s1s2 nl Lungs:clear b/l, no crackle Abdomen:soft,  non-distended, ostomy bag. Extremities:No edema Neurology: Alert awake  Noemi Ishmael Amelie Romney 09/22/2024,1:08 PM  LOS: 1 day

## 2024-09-22 NOTE — Plan of Care (Signed)
  Problem: RH Swallowing Goal: LTG Patient will consume least restrictive diet using compensatory strategies with assistance (SLP) Description: LTG:  Patient will consume least restrictive diet using compensatory strategies with assistance (SLP) Flowsheets (Taken 09/22/2024 1700) LTG: Pt Patient will consume least restrictive diet using compensatory strategies with assistance of (SLP): Minimal Assistance - Patient > 75%   Problem: RH Expression Communication Goal: LTG Patient will increase word finding of common (SLP) Description: LTG:  Patient will increase word finding of common objects/daily info/abstract thoughts with cues using compensatory strategies (SLP). Flowsheets (Taken 09/22/2024 1700) LTG: Patient will increase word finding of common (SLP): Minimal Assistance - Patient > 75% Patient will use compensatory strategies to increase word finding of:  Common objects  Daily info   Problem: RH Problem Solving Goal: LTG Patient will demonstrate problem solving for (SLP) Description: LTG:  Patient will demonstrate problem solving for basic/complex daily situations with cues  (SLP) Flowsheets (Taken 09/22/2024 1700) LTG: Patient will demonstrate problem solving for (SLP): Basic daily situations LTG Patient will demonstrate problem solving for: Minimal Assistance - Patient > 75%   Problem: RH Memory Goal: LTG Patient will use memory compensatory aids to (SLP) Description: LTG:  Patient will use memory compensatory aids to recall biographical/new, daily complex information with cues (SLP) Flowsheets (Taken 09/22/2024 1700) LTG: Patient will use memory compensatory aids to (SLP): Minimal Assistance - Patient > 75%   Problem: RH Attention Goal: LTG Patient will demonstrate this level of attention during functional activites (SLP) Description: LTG:  Patient will will demonstrate this level of attention during functional activites (SLP) Flowsheets (Taken 09/22/2024 1700) Patient will  demonstrate during cognitive/linguistic activities the attention type of: Sustained Patient will demonstrate this level of attention during cognitive/linguistic activities in: Controlled LTG: Patient will demonstrate this level of attention during cognitive/linguistic activities with assistance of (SLP): Minimal Assistance - Patient > 75% Number of minutes patient will demonstrate attention during cognitive/linguistic activities: 15

## 2024-09-22 NOTE — Evaluation (Signed)
 Physical Therapy Assessment and Plan  Patient Details  Name: Noah Burnett MRN: [id number] Date of Birth: 21-Apr-1941  PT Diagnosis: Abnormality of gait, Difficulty walking, Hemiparesis dominant, Impaired cognition, and Muscle weakness Rehab Potential: Good ELOS: 7-10 days   Today's Date: 09/22/2024 PT Individual Time: 8694-8584 PT Individual Time Calculation (min): 70 min    Hospital Problem: Principal Problem:   Subarachnoid hemorrhage, nontraumatic (HCC) Active Problems:   Viral encephalitis   Past Medical History:  Past Medical History:  Diagnosis Date   Arthritis    Bladder tumor    Deafness, left    Full dentures    GERD (gastroesophageal reflux disease)    History of [place] spotted fever    01-25-2020  per pt had in 2018 and was treated w/ no residual   Hyperplasia of prostate with lower urinary tract symptoms (LUTS)    Hypertension    followed by pcp   (01-25-2020  per pt had stress test approx. 2005, told normal , done in [place] somewhere)   Pre-diabetes    followed by pcp ,   watches diet   Prostate cancer [facility]) urologist--- dr watt   dx by bx01-13-2020,  Gleason 3+4 ;  MRI 12-19-2018, vol 96.26;   active survillance   Shingles    Wears glasses    Wears hearing aid in right ear    Past Surgical History:  Past Surgical History:  Procedure Laterality Date   SHOULDER ARTHROSCOPY WITH ROTATOR CUFF REPAIR AND SUBACROMIAL DECOMPRESSION Right 05-20-2009  @[facility]    TRANSURETHRAL RESECTION OF BLADDER TUMOR WITH MITOMYCIN -C N/A 02/01/2020   Procedure: TRANSURETHRAL RESECTION OF BLADDER TUMOR;  Surgeon: Watt Rush, MD;  Location: [facility];  Service: Urology;  Laterality: N/A;   TRANSURETHRAL RESECTION OF PROSTATE      Assessment & Plan Clinical Impression: Patient is a 83 year old right-handed male with history significant for hypertension, prediabetes, GERD, quit smoking 25 years ago, 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  He had an EGD that showed hiatal hernia and compression of the second portion of duodenum with possible intraluminal neoplasm.  Biopsies were obtained which showed severely inflamed and ulcerated duodenal mucosa, but no evidence of malignancy.  A second EGD was done with ultrasound guidance but again pathology of the lesion extensive necrosis.  Surgical oncology consulted underwent robotic gastrojejunostomy on 8/25.  He did have delayed return of bowel function and a PICC line with TPN for several days until he had advancement of his diet.  Since his procedure he had had fairly steady decline with decreasing appetite significant weight loss and inability to walk.  He also had follow-up surgical oncology.  He had repeat MRI which did not show the duodenal mass.  Per chart review patient lives alone.  1 level home one-step to entry.  He has required assist from his family for ADLs and use of a rolling walker for mobility the past 2 weeks and has experienced 1 fall in the past 2 weeks.  Presented 09/08/2024 to [facility] with altered mental status and unwitnessed fall as well as reports of generalized weakness and fatigue.  In the ED abdominal x-ray showed no evidence of obstruction.  Cranial CT scan negative for acute changes.  MRI showed multifocal subcortical contrast-enhancement, greatest at the right parietal-occipital fissure but present at multiple subcortical locations in  both hemispheres.  Small areas of diffusion abnormality at the anterior left insula, left frontal operculum, right frontal operculum, and in multiple locations of both corona radiata and some lesions appearing more acute than others based on lower ADC values.  There was multiple possibilities raised by above findings considerations infectious versus inflammatory encephalitis versus vasculitis in  CSF.  Patient was transferred to [facility] for further evaluation.  Chemistries unremarkable except sodium 128 potassium 3.4 chloride 90 AST 14, total CK of 30, TSH 4.632, a.m. cortisol 30, serum osmolality 257, urine sodium 137, urine osmolality 490 urinalysis negative for pyuria.  Neurology was consulted for evaluation of acute encephalopathy as well as nephrology consulted for hyponatremia.  Patient underwent fluoroscopic guided lumbar puncture and fluid sent for analysis for evaluation of meningitis encephalitis.  Procalcitonin negative, CRP 4.1.  EEG showed diffuse cerebral dysfunction no seizure seen throughout the recording.  VZV PCR was positive, varicella IgG negative.  On the morning of 09/13/2024 patient noted to have left gaze preference and decreased responsiveness code stroke was called patient was seen by neurology services.  Stat CT was done which showed left superior convexity subarachnoid hemorrhage without mass effect.  Otherwise no changes from previous CT.  CT angiogram ruled out large vessel occlusion.  Patient was initially kept n.p.o. follow-up speech therapy.  Eventually MRI completed showed prominent patchy cortical and subcortical signal abnormality throughout both hemispheres overall slight progressed from 09/09/2024 and intermediate although encephalitis and vasculitis remain leading considerations.  ID recommended continuing acyclovir.  Patient's mental status continued to improve.  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  Patient was also started on high-dose methylprednisolone by neurology 09/14/2024 with the plan to continue for 5 days followed by taper of prednisone. In regards to patient's hyponatremia felt to be chronic with nephrology follow-up possibly SIADH noted sodium in August 2025 was 129 and latest sodium 123-124  and currently remains on sodium chloride  tablets x 7  days and the addition of urea  x 6 doses with recent worsening of hyponatremia likely attributed to recent high-dose Solu-Medrol.  The sodium level had actually worsened with normal saline and since IV fluid was required during acyclovir treatment reduce to 25 cc an hour and received a dose of Lasix 09/19/2024 as well as 09/20/2024 and plan to continue Lasix at 20 mg p.o. daily. Bouts of urinary retention on the evening of 09/13/2024 patient was retaining urine bladder scan for 900 cc orders given for nurse for straight caths however that was unsuccessful so coud catheter was placed and started on Flomax. AKI creatinine jumped from 0.76-1 0.66-2.07 09/14/2024 nephrology had been following at that time given IV fluids AKI presumed to be secondary to obstructive uropathy as well as contrast-induced.  AKI resolved 09/15/2024.  Patient also with bouts of hypophosphatemia supplemented with follow-up chemistries Patient was cleared to begin Lovenox for DVT prophylaxis. His diet has been advanced to mechanical soft initially requiring tube feeds for nutritional support.  Palliative care was consulted to establish goals of care.  Patient did have some persistent nausea and vomiting with KUB 09/17/2024 showing ileus.  He was having no tenderness with regular bowel movements reported. Therapy evaluations completed due to patient decreased functional mobility was admitted for a comprehensive rehab program.  Patient currently requires min with mobility secondary to muscle weakness, decreased cardiorespiratoy endurance, decreased initiation and delayed processing, and decreased standing balance, decreased postural control, hemiplegia, and  decreased balance strategies.  Prior to hospitalization, patient was independent  with mobility and lived with Alone in a House home.  Home access is 1Stairs to enter.  Patient will benefit from skilled PT intervention to maximize safe functional mobility, minimize fall risk, and decrease  caregiver burden for planned discharge home with 24 hour supervision.  Anticipate patient will benefit from follow up HH at discharge.  PT - End of Session Activity Tolerance: Tolerates 30+ min activity without fatigue Endurance Deficit: Yes Endurance Deficit Description: rest breaks with all mobility tasks PT Assessment Rehab Potential (ACUTE/IP ONLY): Good PT Barriers to Discharge: Inaccessible home environment;Behavior PT Barriers to Discharge Comments: decreased initiation, step to enter, min assist for mobility at this time PT Patient demonstrates impairments in the following area(s): Balance;Behavior;Endurance;Motor;Perception;Safety PT Transfers Functional Problem(s): Bed Mobility;Bed to Chair;Car;Furniture PT Locomotion Functional Problem(s): Ambulation;Stairs PT Plan PT Intensity: Minimum of 1-2 x/day ,45 to 90 minutes PT Frequency: 5 out of 7 days PT Duration Estimated Length of Stay: 7-10 days PT Treatment/Interventions: Ambulation/gait training;Discharge planning;Functional mobility training;Psychosocial support;Therapeutic Activities;Balance/vestibular training;Neuromuscular re-education;Therapeutic Exercise;Cognitive remediation/compensation;DME/adaptive equipment instruction;UE/LE Strength taining/ROM;Splinting/orthotics;Community reintegration;Patient/family education;Stair training;UE/LE Coordination activities PT Transfers Anticipated Outcome(s): supervision PT Locomotion Anticipated Outcome(s): supervision ambulatory PT Recommendation Recommendations for Other Services: None Follow Up Recommendations: Home health PT Patient destination: Home Equipment Recommended: To be determined Equipment Details: owns RW   PT Evaluation Precautions/Restrictions Precautions Precautions: Fall Recall of Precautions/Restrictions: Impaired Precaution/Restrictions Comments: [employer] Restrictions [employer] Bearing Restrictions Per Provider Order: No Pain Interference Pain  Interference Pain Effect on Sleep: 1. Rarely or not at all Pain Interference with Therapy Activities: 1. Rarely or not at all Pain Interference with Day-to-Day Activities: 1. Rarely or not at all Home Living/Prior Functioning Home Living Available Help at Discharge: Family;Available 24 hours/day (family will be staying with pt at DC) Type of Home: House Home Access: Stairs to enter [employer] of Steps: 1 Entrance Stairs-Rails: None Home Layout: One level Bathroom Shower/Tub: Health Visitor: Handicapped height Bathroom Accessibility: Yes  Lives With: Alone Prior Function Level of Independence: Independent with basic ADLs;Independent with homemaking with ambulation;Independent with gait;Independent with transfers  Able to Take Stairs?: No Driving: No Vocation: Retired Tree Surgeon Status: Impaired/Different from baseline Arousal/Alertness: Awake/alert Memory: Impaired Memory Impairment: Decreased short term memory;Decreased recall of new information;Retrieval deficit;Storage deficit Sensation Sensation Light Touch: Appears Intact Hot/Cold: Appears Intact Proprioception: Appears Intact Stereognosis: Not tested Coordination Gross Motor Movements are Fluid and Coordinated: No Fine Motor Movements are Fluid and Coordinated: No Coordination and Movement Description: Decreased coordination secondary to very mild R hemiparesis and debility Motor  Motor Motor: Hemiplegia Motor - Skilled Clinical Observations: Very mild R hemiparesis and debility  Trunk/Postural Assessment  Cervical Assessment Cervical Assessment: Exceptions to [facility] (forward head) Thoracic Assessment Thoracic Assessment: Exceptions to [facility] (rounded shoulders, significant thoracic kyphosis) Lumbar Assessment Lumbar Assessment: Exceptions to [facility] (posterior pelvic tilt) Postural Control Postural Control: Deficits on evaluation Righting Reactions: delayed and  inadequate Protective Responses: decreased Postural Limitations: decreased  Balance Balance Balance Assessed: Yes Standardized Balance Assessment Standardized Balance Assessment: Timed Up and Go Test Timed Up and Go Test TUG: Normal TUG Normal TUG (seconds): 73.67 Static Sitting Balance Static Sitting - Balance Support: Feet supported;Bilateral upper extremity supported Static Sitting - Level of Assistance: 5: Stand by assistance (SUP) Dynamic Sitting Balance Dynamic Sitting - Balance Support: Bilateral upper extremity supported;Feet supported Dynamic Sitting - Level of Assistance: 5: Stand by assistance (SUP-CGA) Static Standing Balance Static Standing - Balance Support: Bilateral  upper extremity supported;During functional activity Static Standing - Level of Assistance: 5: Stand by assistance (CGA) Dynamic Standing Balance Dynamic Standing - Balance Support: Bilateral upper extremity supported;During functional activity Dynamic Standing - Level of Assistance: 4: Min assist;5: Stand by assistance (CGA-Min A) Extremity Assessment  RLE Assessment RLE Assessment: Exceptions to [facility] General Strength Comments: grossly 4/5, endurance limited LLE Assessment LLE Assessment: Exceptions to [facility] General Strength Comments: grossly 4/5, endurance limited  Care Tool Care Tool Bed Mobility Roll left and right activity   Roll left and right assist level: Supervision/Verbal cueing    Sit to lying activity   Sit to lying assist level: Minimal Assistance - Patient > 75%    Lying to sitting on side of bed activity   Lying to sitting on side of bed assist level: the ability to move from lying on the back to sitting on the side of the bed with no back support.: Minimal Assistance - Patient > 75%     Care Tool Transfers Sit to stand transfer   Sit to stand assist level: Minimal Assistance - Patient > 75%    Chair/bed transfer   Chair/bed transfer assist level: Minimal Assistance - Patient >  75%    Car transfer   Car transfer assist level: Minimal Assistance - Patient > 75%      Care Tool Locomotion Ambulation   Assist level: Minimal Assistance - Patient > 75% Assistive device: Walker-rolling Max distance: 180'  Walk 10 feet activity   Assist level: Minimal Assistance - Patient > 75% Assistive device: Walker-rolling   Walk 50 feet with 2 turns activity   Assist level: Minimal Assistance - Patient > 75% Assistive device: Walker-rolling  Walk 150 feet activity   Assist level: Minimal Assistance - Patient > 75% Assistive device: Walker-rolling  Walk 10 feet on uneven surfaces activity   Assist level: Minimal Assistance - Patient > 75% Assistive device: Walker-rolling  Stairs   Assist level: Minimal Assistance - Patient > 75% Stairs assistive device: 2 hand rails Max number of stairs: 4  Walk up/down 1 step activity   Walk up/down 1 step (curb) assist level: Minimal Assistance - Patient > 75% Walk up/down 1 step or curb assistive device: 2 hand rails  Walk up/down 4 steps activity   Walk up/down 4 steps assist level: Minimal Assistance - Patient > 75% Walk up/down 4 steps assistive device: 2 hand rails  Walk up/down 12 steps activity Walk up/down 12 steps activity did not occur: Safety/medical concerns      Pick up small objects from floor Pick up small object from the floor (from standing position) activity did not occur: Safety/medical concerns      Wheelchair Is the patient using a wheelchair?: Yes (used as transport on unit) Type of Wheelchair: Manual   Wheelchair assist level: Dependent - Patient 0%    Wheel 50 feet with 2 turns activity   Assist Level: Dependent - Patient 0%  Wheel 150 feet activity   Assist Level: Dependent - Patient 0%    Refer to Care Plan for Long Term Goals  SHORT TERM GOAL WEEK 1 PT Short Term Goal 1 (Week 1): STG = LTG due to ELOS  Recommendations for other services: None   Skilled Therapeutic Intervention Evaluation  completed (see details above and below) with education on PT POC and goals and individual treatment initiated with focus on gait training and therapeutic activities for transfer training. Pt denies pain throughout session. Pt completes bed mobility with min  assist for positioning on EOB and for repositioning in supine with sit to supine. Pt completes transfer without device mod assist, with device min assist for stand step and sit to stand transfers. Pt completes gait 180' with RW, slow gait speed trunk flexed, decreased RLE foot clearance and coordination, requires hemisplint for RUE due to impaired grip. Pt completes up/down 4 steps with BHRs with min assist reciprocal gait pattern. Pt completes car transfer with RW with min assist for RLE into car. Pt completes gait up/down ramp with RW with min assist for managing speed with descending decline. Pt provided with seated rest breaks between all gait trials and exercises to promote energy conservation and quality with tasks. Pt returns to room and completes transfer back to bed, returns to supine where he remains semi reclined with all needs within reach, bed alarm activated at end of session.    Mobility Bed Mobility Bed Mobility: Supine to Sit;Sit to Supine Supine to Sit: Minimal Assistance - Patient > 75% Sit to Supine: Minimal Assistance - Patient > 75% Transfers Transfers: Stand Pivot Transfers;Sit to Stand;Stand to Sit Sit to Stand: Minimal Assistance - Patient > 75% Stand to Sit: Minimal Assistance - Patient > 75%;Contact Guard/Touching assist Stand Pivot Transfers: Minimal Assistance - Patient > 75%;Contact Guard/Touching assist Stand Pivot Transfer Details: Verbal cues for safe use of DME/AE;Verbal cues for technique;Verbal cues for precautions/safety Transfer (Assistive device): Rolling walker Locomotion  Gait Ambulation: Yes Gait Assistance: Minimal Assistance - Patient > 75% Gait Distance (Feet): 180 Feet Assistive device: Rolling  walker Gait Assistance Details: Verbal cues for safe use of DME/AE;Verbal cues for precautions/safety;Verbal cues for gait pattern Gait Gait: Yes Gait Pattern: Impaired Gait Pattern: Step-to pattern;Trunk flexed;Narrow base of support;Poor foot clearance - right Gait velocity: decreased Stairs / Additional Locomotion Stairs: Yes Stairs Assistance: Minimal Assistance - Patient > 75% Stair Management Technique: Two rails Number of Stairs: 4 Height of Stairs: 6 Ramp: Minimal Assistance - Patient >75% Wheelchair Mobility Wheelchair Mobility: No   Discharge Criteria: Patient will be discharged from PT if patient refuses treatment 3 consecutive times without medical reason, if treatment goals not met, if there is a change in medical status, if patient makes no progress towards goals or if patient is discharged from hospital.  The above assessment, treatment plan, treatment alternatives and goals were discussed and mutually agreed upon: by patient  Reche Ohara PT, DPT 09/22/2024, 3:48 PM

## 2024-09-22 NOTE — Progress Notes (Signed)
 Inpatient Rehabilitation  Patient information reviewed and entered into eRehab system by [facility]. Karen Kays., CCC/SLP, PPS Coordinator.  Information including medical coding, functional ability and quality indicators will be reviewed and updated through discharge.

## 2024-09-22 NOTE — Discharge Instructions (Signed)
 Inpatient Rehab Discharge Instructions  Noah Burnett Discharge date and time: No discharge date for patient encounter.   Activities/Precautions/ Functional Status: Activity: activity as tolerated Diet: Soft Wound Care: Routine skin checks Functional status:  ___ No restrictions     ___ Walk up steps independently ___ 24/7 supervision/assistance   ___ Walk up steps with assistance ___ Intermittent supervision/assistance  ___ Bathe/dress independently ___ Walk with walker     _x__ Bathe/dress with assistance ___ Walk Independently    ___ Shower independently ___ Walk with assistance    ___ Shower with assistance ___ No alcohol     ___ Return to work/school ________  Special Instructions: No driving smoking or alcohol   My questions have been answered and I understand these instructions. I will adhere to these goals and the provided educational materials after my discharge from the hospital.  Patient/Caregiver Signature _______________________________ Date __________  Clinician Signature _______________________________________ Date __________  Please bring this form and your medication list with you to all your follow-up doctor's appointments.

## 2024-09-22 NOTE — Evaluation (Addendum)
 Speech Language Pathology Assessment and Plan  Patient Details  Name: Noah Burnett MRN: [id number] Date of Birth: [birth year]  SLP Diagnosis: Dysarthria;Cognitive Impairments;Speech and Language deficits;Dysphagia  Rehab Potential: Fair ELOS: 10 days    Today's Date: 09/22/2024 SLP Individual Time: 1430-1520 SLP Individual Time Calculation (min): 50 min and Today's Date: 09/22/2024 SLP Missed Time: 10 Minutes Missed Time Reason: Patient fatigue   Hospital Problem: Principal Problem:   Subarachnoid hemorrhage, nontraumatic (HCC) Active Problems:   Viral encephalitis  Past Medical History:  Past Medical History:  Diagnosis Date   Arthritis    Bladder tumor    Deafness, left    Full dentures    GERD (gastroesophageal reflux disease)    History of [place] spotted fever    01-25-2020  per pt had in 2018 and was treated w/ no residual   Hyperplasia of prostate with lower urinary tract symptoms (LUTS)    Hypertension    followed by pcp   (01-25-2020  per pt had stress test approx. 2005, told normal , done in [place] somewhere)   Pre-diabetes    followed by pcp ,   watches diet   Prostate cancer [facility]) urologist--- dr watt   dx by bx01-13-2020,  Gleason 3+4 ;  MRI 12-19-2018, vol 96.26;   active survillance   Shingles    Wears glasses    Wears hearing aid in right ear    Past Surgical History:  Past Surgical History:  Procedure Laterality Date   SHOULDER ARTHROSCOPY WITH ROTATOR CUFF REPAIR AND SUBACROMIAL DECOMPRESSION Right 05-20-2009  @[facility]    TRANSURETHRAL RESECTION OF BLADDER TUMOR WITH MITOMYCIN -C N/A 02/01/2020   Procedure: TRANSURETHRAL RESECTION OF BLADDER TUMOR;  Surgeon: Watt Rush, MD;  Location: [facility];  Service: Urology;  Laterality: N/A;   TRANSURETHRAL RESECTION OF PROSTATE      Assessment / Plan / Recommendation Clinical Impression Pt is a 83 year old right-handed male with history significant for hypertension,  prediabetes, GERD, quit smoking 25 years ago, 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 He had an EGD that showed hiatal hernia and compression of the second portion of duodenum with possible intraluminal neoplasm. Biopsies were obtained which showed severely inflamed and ulcerated duodenal mucosa, but no evidence of malignancy. A second EGD was done with ultrasound guidance but again pathology of the lesion extensive necrosis. Surgical oncology consulted underwent robotic gastrojejunostomy on 8/25. He did have delayed return of bowel function and a PICC line with TPN for several days until he had advancement of his diet. Since his procedure he had had fairly steady decline with decreasing appetite significant weight loss and inability to walk. He has required assist from his family for ADLs and use of a rolling walker for mobility the past 2 weeks and has experienced 1 fall in the past 2 weeks. Presented 09/08/2024 to [facility] with altered mental status and unwitnessed fall as well as reports of generalized weakness and fatigue. In the ED abdominal x-ray showed no evidence of obstruction. Cranial CT scan negative for acute changes. MRI showed multifocal subcortical contrast-enhancement, greatest at the right parietal-occipital fissure but present at multiple subcortical locations in both hemispheres. Small areas of diffusion abnormality at the anterior left insula, left frontal operculum, right frontal operculum, and in multiple locations of both corona radiata and some lesions appearing more acute than others  based on lower ADC values. There was multiple possibilities raised by above findings considerations infectious versus inflammatory encephalitis versus vasculitis in CSF. Patient was transferred to [facility] for further evaluation. On the  morning of 09/13/2024 patient noted to have left gaze preference and decreased responsiveness code stroke was called patient was seen by neurology services. Stat CT was done which showed left superior convexity subarachnoid hemorrhage without mass effect. Otherwise no changes from previous CT. Eventually MRI completed showed prominent patchy cortical and subcortical signal abnormality throughout both hemispheres overall slight progressed from 09/09/2024 and intermediate although encephalitis and vasculitis remain leading considerations.   Cognitive - Linguistic:  Pt presented w/ moderate cognitive-linguistic deficits overall. No expressive/receptive language deficits noted during initial functional eval tasks (given increased volume d/t hearing deficits). Moderate deficits noted during divergent naming task during SLUMS, though anticipate attention negatively impacted success as well. He scored a 81/30 overall on the [facility] Mental Status (SLUMS) Exam, with deficits noted in STM, sustained attention, and problem solving. His family reports his thinking is fine, though pt reported driving, managing meds, and finances independently prior to ~2 months ago. Of note, pt was very fatigued throughout assessment, which likely negatively impacted his success. He also presented w/ moderate dysarthria characterized by reduced vocal intensity, articulatory precision, and prosody. He was judged to be ~50% intelligible overall.   Bedside Swallow:  No concern re dysphagia w/ thin liquids, as best judged at bedside. Weak cough and R facial weakness noted during OME. He did present w/ hiccups and report of heartburn prior to PO intake as well as belching w/ thin liquids. Hx of GERD noted and SLP provided additional education re reflux precautions. Pt declined any PO trials of solids. Similar to acute care, pt's family requested for him to remain on Dys 3 textures at this time. Appetite remains extremely limited,  and pt may benefit from downgraded textures if mastication is a barrier given attention/deconditioned state. Will dynamically assess to ensure diet tolerance.   Pt would benefit from skilled ST services to target aforementioned deficits, maximize pt independence, and reduce caregiver burden.      Skilled Therapeutic Interventions          SLP facilitated a cognitive-linguistic evaluation and brief bedside swallow screen to assess pt's cognitive-communication skills and determine need for additional skilled ST services. See above for more information.    SLP Assessment  Patient will need skilled Speech Lanaguage Pathology Services during CIR admission    Recommendations  SLP Diet Recommendations: Dysphagia 3 (Mech soft);Thin Liquid Administration via: Straw Medication Administration: Whole meds with liquid (large pills crushed) Supervision: Full supervision/cueing for compensatory strategies Compensations: Small sips/bites;Slow rate Postural Changes and/or Swallow Maneuvers: Seated upright 90 degrees;Upright 30-60 min after meal Oral Care Recommendations: Oral care BID Recommendations for Other Services: Neuropsych consult Patient destination: Home Follow up Recommendations: 24 hour supervision/assistance;Home Health SLP;Outpatient SLP Equipment Recommended: None recommended by SLP    SLP Frequency 3 to 5 out of 7 days   SLP Duration  SLP Intensity  SLP Treatment/Interventions 10 days  Minumum of 1-2 x/day, 30 to 90 minutes  Cognitive remediation/compensation;Dysphagia/aspiration precaution training;Internal/external aids;Speech/Language facilitation;Cueing hierarchy;Oral motor exercises;Therapeutic Activities;Functional tasks;Patient/family education;Therapeutic Exercise;Environmental controls    Pain Pain Assessment Pain Scale: 0-10 Pain Score: 0-No pain  Prior Functioning Type of Home: House  Lives With: Alone Available Help at Discharge: Family Education: 12th  grade Vocation: Retired  SLP Evaluation Cognition Overall Cognitive Status: Impaired/Different from baseline Arousal/Alertness: Awake/alert Orientation  Level: Oriented to person;Oriented to place Year: 2025 Month: October Day of Week: Correct Attention: Sustained Sustained Attention: Impaired Sustained Attention Impairment: Verbal basic;Functional basic Memory: Impaired Memory Impairment: Decreased short term memory;Decreased recall of new information;Retrieval deficit;Storage deficit Decreased Short Term Memory: Verbal basic;Functional basic Awareness: Impaired Awareness Impairment: Intellectual impairment Problem Solving: Impaired Problem Solving Impairment: Verbal complex;Functional basic  Comprehension Auditory Comprehension Overall Auditory Comprehension: Appears within functional limits for tasks assessed Visual Recognition/Discrimination Discrimination: Not tested Reading Comprehension Reading Status: Not tested Expression Expression Primary Mode of Expression: Verbal Verbal Expression Overall Verbal Expression: Appears within functional limits for tasks assessed Written Expression Dominant Hand: Right Written Expression: Not tested Oral Motor Oral Motor/Sensory Function Overall Oral Motor/Sensory Function: Mild impairment Facial ROM: Reduced right Facial Symmetry: Abnormal symmetry right Facial Strength: Reduced right Lingual ROM: Reduced right Lingual Strength: Reduced Motor Speech Overall Motor Speech: Impaired Respiration: Impaired Level of Impairment: Word Phonation: Low vocal intensity Resonance: Within functional limits Articulation: Impaired Level of Impairment: Word Intelligibility: Intelligibility reduced Word: 50-74% accurate Phrase: 25-49% accurate Sentence: 25-49% accurate Conversation: 25-49% accurate Motor Planning: Within functional limits Interfering Components: Inadequate dentition;Hearing loss Effective Techniques: Slow  rate;Increased vocal intensity;Over-articulate  Care Tool Care Tool Cognition Ability to hear (with hearing aid or hearing appliances if normally used Ability to hear (with hearing aid or hearing appliances if normally used): 2. Moderate difficulty - speaker has to increase volume and speak distinctly   Expression of Ideas and Wants Expression of Ideas and Wants: 3. Some difficulty - exhibits some difficulty with expressing needs and ideas (e.g, some words or finishing thoughts) or speech is not clear   Understanding Verbal and Non-Verbal Content Understanding Verbal and Non-Verbal Content: 3. Usually understands - understands most conversations, but misses some part/intent of message. Requires cues at times to understand  Memory/Recall Ability Memory/Recall Ability : That he or she is in a hospital/hospital unit;Current season   Motor Speech Assessment  Intelligibility: Intelligibility reduced Word: 50-74% accurate Phrase: 25-49% accurate Sentence: 25-49% accurate Conversation: 25-49% accurate  Bedside Swallowing Assessment BSE Assessment Suspected Esophageal Findings Suspected Esophageal Findings: Belching Risk for Aspiration Impact on safety and function: Mild aspiration risk Other Related Risk Factors: Lethargy;Cognitive impairment;Deconditioning  Short Term Goals: Week 1: SLP Short Term Goal 1 (Week 1): STGs = LTGs d/t ELOS  Refer to Care Plan for Long Term Goals  Recommendations for other services: Neuropsych  Discharge Criteria: Patient will be discharged from SLP if patient refuses treatment 3 consecutive times without medical reason, if treatment goals not met, if there is a change in medical status, if patient makes no progress towards goals or if patient is discharged from hospital.  The above assessment, treatment plan, treatment alternatives and goals were discussed and mutually agreed upon: by patient and by family  [name] 09/22/2024, 4:41 PM

## 2024-09-22 NOTE — Progress Notes (Addendum)
 PROGRESS NOTE   Subjective/Complaints:  No events overnight.  No acute complaints, tired after therapies. A.m. labs with dramatically increased sodium from 124-> 132, calcium 7.1, albumin 2.0, slight uptrend in WBCs but remains within normal limits.  Other labs stable.   Vitals stable      09/22/2024    5:53 AM 09/21/2024    8:27 PM 09/21/2024    5:34 PM  Vitals with BMI  Height   5' 7  Weight   105 lbs 3 oz  BMI   16.47  Systolic 134 137 875  Diastolic 65 65 90  Pulse 70 96 97    Blood sugars well-controlled P.o. intakes not recorded yesterday Incontinent of bladder/ bowel, last bowel movement 11-14, large; continues on Foley, per pharmacy getting all medications through tube and cannot get Flomax crushed.  ROS: Denies fevers, chills, N/V, abdominal pain, constipation, diarrhea, SOB, cough, chest pain, new weakness or paraesthesias.   + Urinary incontinence + Bowel incontinence  Objective:   DG Chest 1 View Result Date: 09/21/2024 EXAM: 1 VIEW(S) XRAY OF THE CHEST 09/21/2024 06:47:00 PM COMPARISON: 09/08/2024 CLINICAL HISTORY: Cough FINDINGS: LINES, TUBES AND DEVICES: Feeding tube in place coursing below the diaphragm with tip beyond field-of-view. LUNGS AND PLEURA: Low lung volumes accentuated interstitial markings. Chronic interstitial coarsening similar to 09/08/2024. No focal pulmonary opacity. No pleural effusion. No pneumothorax. HEART AND MEDIASTINUM: Atherosclerotic calcifications of aortic arch. No acute abnormality of the cardiac silhouette. BONES AND SOFT TISSUES: No acute osseous abnormality. IMPRESSION: 1. Low lung volumes. No acute cardiopulmonary process. Electronically signed by: Norman Gatlin MD 09/21/2024 07:49 PM EST RP Workstation: [id number]   Recent Labs    09/22/24 0526  WBC 9.6  HGB 12.5*  HCT 36.3*  PLT 208   Recent Labs    09/21/24 0501 09/21/24 1955 09/22/24 0526  NA 124*  --   132*  K 3.3*  --  4.8  CL 90*  --  100  CO2 25  --  22  GLUCOSE 176*  --  136*  BUN 30*  --  23  CREATININE 0.54* 0.57* 0.53*  CALCIUM 6.6*  --  7.1*    Intake/Output Summary (Last 24 hours) at 09/22/2024 0824 Last data filed at 09/22/2024 0658 Gross per 24 hour  Intake 298.49 ml  Output 900 ml  Net -601.51 ml        Physical Exam: Vital Signs Blood pressure (!) 124/90, pulse 97, temperature 97.9 F (36.6 C), temperature source Oral, resp. rate 16, height 5' 7 (1.702 m), weight 47.7 kg, SpO2 97%.   Constitutional: No apparent distress. + Underweight.  Laying in bed. HENT: No JVD. Neck Supple. Trachea midline. Atraumatic, normocephalic. Eyes: PERRLA. EOMI. Visual fields grossly intact. + Slight nystagmus on EOMI Cardiovascular: RRR, no murmurs/rub/gallops. No Edema. Peripheral pulses 2+  Respiratory: CTAB. No rales, rhonchi, or wheezing. On RA.  Occasional productive, yellow cough. Abdomen: + bowel sounds, normoactive. No distention or tenderness. + cortrak GU: Not examined. + Catheter, draining clear urine.  Skin: C/D/I. No apparent lesions.  Peripheral IVs intact.   MSK:      No apparent deformity.       Neurologic exam:  Cognition: AAO to person; not to place and time.  Follows all commands appropriately. Language: Moderate to severe dysarthria Memory: Recalls 0/3 objects at 5 minutes.   Insight: Poor insight into current condition.  Mood: Flat affect Sensation: To light touch intact in BL UEs and LEs   Reflexes: 2+ in BL UE and LEs. Negative Hoffman's and babinski signs bilaterally.   CN: 2-12 grossly intact.   Coordination: No apparent tremors.  Mild left upper extremity ataxia. Spasticity: MAS 0 in all extremities.        Strength:                RUE: 5/5 SA, 5/5 EF, 5/5 EE, 5/5 WE, 5/5 FF, 5/5 FA                LUE:  4/5 SA, 4/5 EF, 4/5 EE, 4/5 WE, 4/5 FF, 4/5 FA                RLE: 5/5 HF, 5/5 KE, 5/5  DF, 5/5  EHL, 5/5  PF                 LLE:  4/5 HF,  4/5 KE, 4/5  DF, 4/5  EHL, 4/5  PF      Assessment/Plan: 1. Functional deficits which require 3+ hours per day of interdisciplinary therapy in a comprehensive inpatient rehab setting. Physiatrist is providing close team supervision and 24 hour management of active medical problems listed below. Physiatrist and rehab team continue to assess barriers to discharge/monitor patient progress toward functional and medical goals  Care Tool:  Bathing              Bathing assist       Upper Body Dressing/Undressing Upper body dressing        Upper body assist      Lower Body Dressing/Undressing Lower body dressing            Lower body assist       Toileting Toileting    Toileting assist       Transfers Chair/bed transfer  Transfers assist           Locomotion Ambulation   Ambulation assist              Walk 10 feet activity   Assist           Walk 50 feet activity   Assist           Walk 150 feet activity   Assist           Walk 10 feet on uneven surface  activity   Assist           Wheelchair     Assist               Wheelchair 50 feet with 2 turns activity    Assist            Wheelchair 150 feet activity     Assist          Blood pressure (!) 124/90, pulse 97, temperature 97.9 F (36.6 C), temperature source Oral, resp. rate 16, height 5' 7 (1.702 m), weight 47.7 kg, SpO2 97%.  Medical Problem List and Plan: 1. 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  Antithrombotics: -DVT/anticoagulation:  Pharmaceutical: Lovenox initiated 09/09/2024             -antiplatelet therapy: N/A 3. Pain Management: Tylenol  as needed 4. Mood/Behavior/Sleep: Provide emotional support              -antipsychotic agents: N/A 5. Neuropsych/cognition: This patient is not capable of making decisions on his own behalf. 6. Skin/Wound Care: Routine skin checks 7. Fluids/Electrolytes/Nutrition: Routine in and outs with follow-up chemistries  - add calcium supplement  8.  Chronic hyponatremia/SIADH as well as hypophosphatemia/hypokalemia..  Continue sodium chloride  tablets 2 g 3 times daily x 7 days as well as the addition of urea  15 mg twice daily x 6 doses and Lasix 20 mg daily.             - Nephrology following closely on inpatient; low threshold to reengage  11-14: Sodium improved to 132 this a.m.; Dr. Dolan reduced salt tabs to 1 g 3 times daily--appreciate nephrology assistance. trend BMP daily.    9.  ID encephalitis/vasculitis.  ID recommending continue acyclovir x 14 days.  Patient also started on high-dose methylprednisolone by neurology 09/14/2024 with 5-day course completed. 10.  Acute urinary retention as well as history of prostate cancer status post TURP.  Patient with retaining urine 09/13/2024 bladder scan 900 cc.  Order was given for straight cath unsuccessful coud catheter placed.  Initiated Flomax.  Plan voiding trial once on rehab.  - 11-14: Flomax unable to be crushed through tube; start terazosin 1 mg nightly.  Plan for DC Foley trial in AM.  11.  AKI.  Resolved.  Follow-up chemistries  12.  GERD/duodenal ulceration, stricture and mass status post robotic gastrojejunostomy 8/25 at [facility].  Continue PPI  13.  Hypertension.  Norvasc 5 mg daily.  HCTZ at home currently on hold due to soft blood pressures.  Continue to monitor with increased mobility.  - Blood pressure stable  11-14: Starting terazosin for urinary retention as above; monitor for hypotension.  Continue oral Lasix per nephrology.    09/22/2024    5:53 AM 09/21/2024    8:27 PM 09/21/2024    5:34 PM  Vitals with BMI  Height   5' 7  Weight   105 lbs 3 oz  BMI   16.47  Systolic 134 137 875   Diastolic 65 65 90  Pulse 70 96 97     14.  Recent diagnosis of shingles September 2025.  Acyclovir as directed 15.  Hyperlipidemia.  Crestor 16.  Decreased nutritional storage/ileus.  Diet advanced to mechanical soft with tube feeds for nutritional support.    - Dietary follow-up; continues to be dependent on tube feeds.  Monitor for p.o. intakes 11-14: Refusing most meals, start Megace 400 mg twice daily for stimulation.  17. Productive cough on TF.  Will get chest x-ray on unit admission to evaluate.--Low lung volumes, no lesions   - Incentive spirometer, flutter valve   LOS: 1 days A FACE TO FACE EVALUATION WAS PERFORMED  Noah Burnett Likes 09/22/2024, 8:24 AM

## 2024-09-22 NOTE — Progress Notes (Addendum)
 Inpatient Rehabilitation Care Coordinator Assessment and Plan Patient Details  Name: Noah Burnett MRN: [id number] Date of Birth: 05-23-1941  Today's Date: 09/22/2024  Hospital Problems: Principal Problem:   Subarachnoid hemorrhage, nontraumatic (HCC) Active Problems:   Viral encephalitis  Past Medical History:  Past Medical History:  Diagnosis Date   Arthritis    Bladder tumor    Deafness, left    Full dentures    GERD (gastroesophageal reflux disease)    History of [place] spotted fever    01-25-2020  per pt had in 2018 and was treated w/ no residual   Hyperplasia of prostate with lower urinary tract symptoms (LUTS)    Hypertension    followed by pcp   (01-25-2020  per pt had stress test approx. 2005, told normal , done in [place] somewhere)   Pre-diabetes    followed by pcp ,   watches diet   Prostate cancer [facility]) urologist--- dr watt   dx by bx01-13-2020,  Gleason 3+4 ;  MRI 12-19-2018, vol 96.26;   active survillance   Shingles    Wears glasses    Wears hearing aid in right ear    Past Surgical History:  Past Surgical History:  Procedure Laterality Date   SHOULDER ARTHROSCOPY WITH ROTATOR CUFF REPAIR AND SUBACROMIAL DECOMPRESSION Right 05-20-2009  @[facility]    TRANSURETHRAL RESECTION OF BLADDER TUMOR WITH MITOMYCIN -C N/A 02/01/2020   Procedure: TRANSURETHRAL RESECTION OF BLADDER TUMOR;  Surgeon: Watt Rush, MD;  Location: [facility];  Service: Urology;  Laterality: N/A;   TRANSURETHRAL RESECTION OF PROSTATE     Social History:  reports that he quit smoking about 25 years ago. His smoking use included cigarettes. He started smoking about 61 years ago. He has a 36 pack-year smoking history. He has been exposed to tobacco smoke. He has never used smokeless tobacco. He reports that he does not currently use alcohol. He reports that he does not use drugs.  Family / Support Systems Marital Status: Separated How Long?: Pt reports he was seperated  for quite awhile but she has been deceased for 10 years. Patient Roles: Spouse, Parent Spouse/Significant Other: N/A Children: 3 children- Noah Burnett (dtr), Noah Burnett and Noah Burnett- everyone lives on the same land. Other Supports: none Anticipated Caregiver: various family members Ability/Limitations of Caregiver: Pt will d/c to his home, and will have support from his children and various family members. 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 - How often do you need to have someone help you when you read instructions, pamphlets, or other written material from your doctor or pharmacy?: Never Writes: Yes Employment Status: Retired Marine Scientist Issues: Denies Electronics Engineer: Denies   Abuse/Neglect Abuse/Neglect Assessment Can Be Completed: Yes Physical Abuse: Denies Verbal Abuse: Denies Sexual Abuse: Denies Exploitation of patient/patient's resources: Denies Self-Neglect: Denies  Patient response to: Social Isolation - How often do you feel lonely or isolated from those around you?: Rarely  Emotional Status Pt's affect, behavior and adjustment status: Pt adjusting as well as to be expected. Recent Psychosocial Issues: Denies Psychiatric History: Denies Substance Abuse History: Pt reports that he quit smoking cigarettes 20 yrs ago; denies rec and tobacco use  Patient / Family Perceptions, Expectations & Goals Pt/Family understanding of illness & functional limitations: Pt and son have a general understanding of care needs Premorbid pt/family roles/activities: Independent Anticipated changes in roles/activities/participation: Assistance with  ADLs/IADLs Pt/family expectations/goals: Pt wants to work on his R hand and getting it working again  [employer]:  None Premorbid Home Care/DME Agencies: None Transportation available at discharge: TBD Is the patient able to respond to transportation needs?: Yes In the past 12 months, has lack of transportation kept you from medical appointments or from getting medications?: No In the past 12 months, has lack of transportation kept you from meetings, work, or from getting things needed for daily living?: No Resource referrals recommended: Neuropsychology  Discharge Planning Living Arrangements: Children Support Systems: Children Type of Residence: Private residence Insurance Resources: Media Planner (specify) Administrator Medicare) Financial Resources: Restaurant Manager, Fast Food Screen Referred: No Living Expenses: Own Money Management: Patient Does the patient have any problems obtaining your medications?: No Home Management: Pt manages all of his home care needs Patient/Family Preliminary Plans: TBD Care Coordinator Barriers to Discharge: Insurance for SNF coverage Care Coordinator Anticipated Follow Up Needs: HH/OP  Clinical Impression SW met with pt in room to introduce self, explain role and discuss discharge process. SW informed on ELOS. Pt is not a cytogeneticist. No HCPOA. Pt dtr Noah Burnett arrived at end of assessment. She was informed  there will be updates after team conference. DNE: RWSABRA Graeme DELENA Feliciana 09/22/2024, 4:49 PM

## 2024-09-23 DIAGNOSIS — I609 Nontraumatic subarachnoid hemorrhage, unspecified: Secondary | ICD-10-CM | POA: Diagnosis not present

## 2024-09-23 LAB — BASIC METABOLIC PANEL WITH GFR
Anion gap: 10 (ref 5–15)
BUN: 18 mg/dL (ref 8–23)
CO2: 23 mmol/L (ref 22–32)
Calcium: 7.1 mg/dL — ABNORMAL LOW (ref 8.9–10.3)
Chloride: 97 mmol/L — ABNORMAL LOW (ref 98–111)
Creatinine, Ser: 0.48 mg/dL — ABNORMAL LOW (ref 0.61–1.24)
GFR, Estimated: >60 mL/min (ref >60)
Glucose, Bld: 128 mg/dL — ABNORMAL HIGH (ref 70–99)
Potassium: 3.9 mmol/L (ref 3.5–5.1)
Sodium: 130 mmol/L — ABNORMAL LOW (ref 135–145)

## 2024-09-23 LAB — GLUCOSE, CAPILLARY
Glucose-Capillary: 121 mg/dL — ABNORMAL HIGH (ref 70–99)
Glucose-Capillary: 130 mg/dL — ABNORMAL HIGH (ref 70–99)
Glucose-Capillary: 142 mg/dL — ABNORMAL HIGH (ref 70–99)
Glucose-Capillary: 144 mg/dL — ABNORMAL HIGH (ref 70–99)
Glucose-Capillary: 156 mg/dL — ABNORMAL HIGH (ref 70–99)
Glucose-Capillary: 156 mg/dL — ABNORMAL HIGH (ref 70–99)

## 2024-09-23 MED ORDER — KATE FARMS STANDARD 1.4 EN LIQD
325.0000 mL | Freq: Two times a day (BID) | ENTERAL | Status: DC
  Filled 2024-09-23: qty 325

## 2024-09-23 MED ORDER — TERAZOSIN HCL 1 MG PO CAPS
1.0000 mg | ORAL_CAPSULE | Freq: Every day | ORAL | Status: DC
  Administered 2024-09-23: 1 mg via ORAL
  Filled 2024-09-23 (×2): qty 1

## 2024-09-23 MED ORDER — MIRTAZAPINE 15 MG PO TABS
15.0000 mg | ORAL_TABLET | Freq: Every day | ORAL | Status: DC
  Administered 2024-09-23: 15 mg
  Filled 2024-09-23: qty 1

## 2024-09-23 MED ORDER — LIDOCAINE HCL URETHRAL/MUCOSAL 2 % EX GEL: 1.0000 | CUTANEOUS | Status: DC | PRN

## 2024-09-23 MED ORDER — MEGESTROL ACETATE 400 MG/10ML PO SUSP
400.0000 mg | Freq: Two times a day (BID) | ORAL | Status: DC
  Administered 2024-09-23 – 2024-09-24 (×3): 400 mg
  Filled 2024-09-23 (×3): qty 10

## 2024-09-23 MED ORDER — KATE FARMS STANDARD 1.4 PO LIQD
325.0000 mL | Freq: Two times a day (BID) | ORAL | Status: DC
  Filled 2024-09-23 (×3): qty 325

## 2024-09-23 MED ORDER — NUTRISOURCE FIBER PO PACK
1.0000 | PACK | Freq: Every day | ORAL | Status: DC
  Administered 2024-09-23 – 2024-09-24 (×2): 1
  Filled 2024-09-23 (×2): qty 1

## 2024-09-23 NOTE — Progress Notes (Signed)
 Occupational Therapy Session Note  Patient Details  Name: ASEEL UHDE MRN: [id number] Date of Birth: 1941-11-01  Today's Date: 09/23/2024 OT Individual Time: 9052-8954 OT Individual Time Calculation (min): 58 min    Short Term Goals: Week 1:  OT Short Term Goal 1 (Week 1): Pt will thread LB garments with CGA. OT Short Term Goal 2 (Week 1): Pt will complete 3/3 toileting tasks with Min A + LRAD. OT Short Term Goal 3 (Week 1): Pt will dress UB with Min A.  Skilled Therapeutic Interventions/Progress Updates:   Patient agreeable to participate in OT session. Reports 0/10 pain level.   Patient participated in skilled OT session focusing on  functional mobility,  fine motor coordination. Patient able to complete functional mobility to gym for 120 ft with mod A for one loss of balance overall CGA. Patient then completed clothe spins activity for fine motor and wrist control with red and Milas Schappell min A and frustration notes. Downgraded to peg placement activity. Patient able to place 10 pegs with min A for control and in hand manipulation of pegs. Patient then able to complete tossing activity with bean bags for 10 ft. Patient then had to complete weight for nursing step on scale min to mod A. Returned to room CGA with RW, mod A to scoot in bed. All needs in reach alarm on.     Therapy Documentation Precautions:  Precautions Precautions: Fall Recall of Precautions/Restrictions: Impaired Precaution/Restrictions Comments: Cortrak Restrictions Weight Bearing Restrictions Per Provider Order: No  Therapy/Group: Individual Therapy  D'mariea L Kaiden Pech 09/23/2024, 7:27 AM

## 2024-09-23 NOTE — Progress Notes (Signed)
 PROGRESS NOTE   Subjective/Complaints:  Pt reports no appetite- things don't taste right.   Still here' when asked how he's doing. LBM last night- had 2 Bms- yesterday- type 6/7- said all over bed- was incontinent  Denies pain.   Foley removed this AM- voided already- 4cc- bladder scan.     Vitals stable      09/23/2024    9:28 AM 09/23/2024    4:54 AM 09/22/2024    8:35 PM  Vitals with BMI  Systolic 137 136 886  Diastolic 72 68 77  Pulse  87 89      ROS:  Pt denies SOB, abd pain, CP, N/V/C/D, and vision changes   + Urinary incontinence + Bowel incontinence  Objective:   DG Chest 1 View Result Date: 09/21/2024 EXAM: 1 VIEW(S) XRAY OF THE CHEST 09/21/2024 06:47:00 PM COMPARISON: 09/08/2024 CLINICAL HISTORY: Cough FINDINGS: LINES, TUBES AND DEVICES: Feeding tube in place coursing below the diaphragm with tip beyond field-of-view. LUNGS AND PLEURA: Low lung volumes accentuated interstitial markings. Chronic interstitial coarsening similar to 09/08/2024. No focal pulmonary opacity. No pleural effusion. No pneumothorax. HEART AND MEDIASTINUM: Atherosclerotic calcifications of aortic arch. No acute abnormality of the cardiac silhouette. BONES AND SOFT TISSUES: No acute osseous abnormality. IMPRESSION: 1. Low lung volumes. No acute cardiopulmonary process. Electronically signed by: Norman Gatlin MD 09/21/2024 07:49 PM EST RP Workstation: [id number]   Recent Labs    09/22/24 0526  WBC 9.6  HGB 12.5*  HCT 36.3*  PLT 208   Recent Labs    09/22/24 0526 09/23/24 0430  NA 132* 130*  K 4.8 3.9  CL 100 97*  CO2 22 23  GLUCOSE 136* 128*  BUN 23 18  CREATININE 0.53* 0.48*  CALCIUM 7.1* 7.1*    Intake/Output Summary (Last 24 hours) at 09/23/2024 1003 Last data filed at 09/23/2024 0945 Gross per 24 hour  Intake 665 ml  Output 2350 ml  Net -1685 ml        Physical Exam: Vital Signs Blood pressure  137/72, pulse 97, temperature 97.8 F (36.6 C), temperature source Oral, resp. rate 16, height 5' 7 (1.702 m), weight 47 kg, SpO2 97%.    General: awake, alert, quiet, son and daughter at bedside- adults- pt cachetic- BMI 16; NAD HENT: conjugate gaze; oropharynx dry- missing teeth; has cortrak in nose- no thrush CV: regular rate and rhythm- rate borderline tachycardic; no JVD Pulmonary: slightly coarse, but not breathing deeply; no W/R/R heard GI: soft, but either small protuberant abd or bloated- hypoactive BS, NT Psychiatric: flat, quiet- but interacts with 1-3 word answers Neurological: alert  GU: Not examined. + Catheter, draining clear urine.  Skin: C/D/I. No apparent lesions.  Peripheral IVs intact.   MSK:      No apparent deformity.       Neurologic exam:  Cognition: AAO to person; not to place and time.  Follows all commands appropriately. Language: Moderate to severe dysarthria Memory: Recalls 0/3 objects at 5 minutes.   Insight: Poor insight into current condition.  Mood: Flat affect Sensation: To light touch intact in BL UEs and LEs   Reflexes: 2+ in BL UE and LEs. Negative Hoffman's and  babinski signs bilaterally.   CN: 2-12 grossly intact.   Coordination: No apparent tremors.  Mild left upper extremity ataxia. Spasticity: MAS 0 in all extremities.        Strength:                RUE: 5/5 SA, 5/5 EF, 5/5 EE, 5/5 WE, 5/5 FF, 5/5 FA                LUE:  4/5 SA, 4/5 EF, 4/5 EE, 4/5 WE, 4/5 FF, 4/5 FA                RLE: 5/5 HF, 5/5 KE, 5/5  DF, 5/5  EHL, 5/5  PF                 LLE:  4/5 HF, 4/5 KE, 4/5  DF, 4/5  EHL, 4/5  PF      Assessment/Plan: 1. Functional deficits which require 3+ hours per day of interdisciplinary therapy in a comprehensive inpatient rehab setting. Physiatrist is providing close team supervision and 24 hour management of active medical problems listed below. Physiatrist and rehab team continue to assess barriers to discharge/monitor patient  progress toward functional and medical goals  Care Tool:  Bathing    Body parts bathed by patient: Right arm, Left arm, Chest, Abdomen, Right upper leg, Left upper leg, Front perineal area, Buttocks, Face   Body parts bathed by helper: Right lower leg, Left lower leg     Bathing assist Assist Level: Minimal Assistance - Patient > 75%     Upper Body Dressing/Undressing Upper body dressing   What is the patient wearing?: Pull over shirt    Upper body assist Assist Level: Moderate Assistance - Patient 50 - 74%    Lower Body Dressing/Undressing Lower body dressing      What is the patient wearing?: Incontinence brief, Pants     Lower body assist Assist for lower body dressing: Maximal Assistance - Patient 25 - 49%     Toileting Toileting    Toileting assist Assist for toileting: Moderate Assistance - Patient 50 - 74%     Transfers Chair/bed transfer  Transfers assist     Chair/bed transfer assist level: Minimal Assistance - Patient > 75%     Locomotion Ambulation   Ambulation assist      Assist level: Minimal Assistance - Patient > 75% Assistive device: Walker-rolling Max distance: 180'   Walk 10 feet activity   Assist     Assist level: Minimal Assistance - Patient > 75% Assistive device: Walker-rolling   Walk 50 feet activity   Assist    Assist level: Minimal Assistance - Patient > 75% Assistive device: Walker-rolling    Walk 150 feet activity   Assist    Assist level: Minimal Assistance - Patient > 75% Assistive device: Walker-rolling    Walk 10 feet on uneven surface  activity   Assist     Assist level: Minimal Assistance - Patient > 75% Assistive device: Development Worker, International Aid     Assist Is the patient using a wheelchair?: Yes (used as transport on unit) Type of Wheelchair: Manual    Wheelchair assist level: Dependent - Patient 0%      Wheelchair 50 feet with 2 turns activity    Assist         Assist Level: Dependent - Patient 0%   Wheelchair 150 feet activity     Assist      Assist Level: Dependent -  Patient 0%   Blood pressure 137/72, pulse 97, temperature 97.8 F (36.6 C), temperature source Oral, resp. rate 16, height 5' 7 (1.702 m), weight 47 kg, SpO2 97%.  Medical Problem List and Plan: 1. Functional deficits secondary to L nontraumatic SAH secondary to VZ viral encephalitis              -patient may  shower             -ELOS/Goals: 12 to 14 days, min assist PT/OT, mod assist SLP             Con't CIR PT, OT and SLP  Prolonged d/w adult children about his appetite,  and with nursing about TF's and tolerance.  2.  Antithrombotics: -DVT/anticoagulation:  Pharmaceutical: Lovenox initiated 09/09/2024             -antiplatelet therapy: N/A 3. Pain Management: Tylenol  as needed 4. Mood/Behavior/Sleep: Provide emotional support             -antipsychotic agents: N/A 5. Neuropsych/cognition: This patient is not capable of making decisions on his own behalf. 6. Skin/Wound Care: Routine skin checks 7. Fluids/Electrolytes/Nutrition: Routine in and outs with follow-up chemistries  - add calcium supplement  8.  Chronic hyponatremia/SIADH as well as hypophosphatemia/hypokalemia..  Continue sodium chloride  tablets 2 g 3 times daily x 7 days as well as the addition of urea  15 mg twice daily x 6 doses and Lasix 20 mg daily.             - Nephrology following closely on inpatient; low threshold to reengage  11-14: Sodium improved to 132 this a.m.; Dr. Dolan reduced salt tabs to 1 g 3 times daily--appreciate nephrology assistance. trend BMP daily.    11/15- Na 130 today- down slightly, but not 123 right now- per renal 9.  ID encephalitis/vasculitis.  ID recommending continue acyclovir x 14 days.  Patient also started on high-dose methylprednisolone by neurology 09/14/2024 with 5-day course completed. 10.  Acute urinary retention as well as history of prostate cancer status  post TURP.  Patient with retaining urine 09/13/2024 bladder scan 900 cc.  Order was given for straight cath unsuccessful coud catheter placed.  Initiated Flomax.  Plan voiding trial once on rehab.  - 11-14: Flomax unable to be crushed through tube; start terazosin 1 mg nightly.  Plan for DC Foley trial in AM.  11/15- D/C foley this AM- will order bladder scans q6 hours and then in/out caths with code' and lidocaine  jelly if volumes >350cc  11.  AKI.  Resolved.  Follow-up chemistries  12.  GERD/duodenal ulceration, stricture and mass status post robotic gastrojejunostomy 8/25 at [facility].  Continue PPI  13.  Hypertension.  Norvasc 5 mg daily.  HCTZ at home currently on hold due to soft blood pressures.  Continue to monitor with increased mobility.  - Blood pressure stable  11-14: Starting terazosin for urinary retention as above; monitor for hypotension.  Continue oral Lasix per nephrology. 11/15- BP looks good- con't regimen    09/23/2024    9:28 AM 09/23/2024    4:54 AM 09/22/2024    8:35 PM  Vitals with BMI  Systolic 137 136 886  Diastolic 72 68 77  Pulse  87 89     14.  Recent diagnosis of shingles September 2025.  Acyclovir as directed 15.  Hyperlipidemia.  Crestor 16.  Decreased nutritional storage/ileus.  Diet advanced to mechanical soft with tube feeds for nutritional support.    - Dietary follow-up;  continues to be dependent on tube feeds.  Monitor for p.o. intakes 11-14: Refusing most meals, start Megace 400 mg twice daily for stimulation.  11/15- changed Mallie foods to PO, between meals per Dietitian's note- pt not tolerating well - a lot of bloating and nausea when receives via tube- Also added Remeron 15 mg at bedtime for mood/appetite as well- per family had Megace at home for 3 days prior to admission- poor appetite has been much longer than hospital admission- thinks don't taste right- no thrush. 17. Productive cough on TF.  Will get chest x-ray on unit admission to  evaluate.--Low lung volumes, no lesions   - Incentive spirometer, flutter valve 18. Loose stools  11/15- likely due to TF's since not eating much-will add Fiber 1 package/day to tube- and monitor- was full of stools, so makes sense had multiple BM's yesterday- if gets stopped up/constipated again, will reassess.  I spent a total of  56  minutes on total care today- >50% coordination of care- due to  D/w pt, daughter and son; as well as nursing- also reviewed chart, orders, vitals, labs, and B/B- prolonged period in room- and d/w nursing about TF's, appetite.  LOS: 2 days A FACE TO FACE EVALUATION WAS PERFORMED  Lavina Resor 09/23/2024, 10:03 AM

## 2024-09-23 NOTE — Progress Notes (Signed)
 Foley discontinued; Pt is DTV at 2109.

## 2024-09-23 NOTE — IPOC Note (Signed)
 Overall Plan of Care [facility]) Patient Details Name: Noah Burnett MRN: [id number] DOB: [birth year]  Admitting Diagnosis: Subarachnoid hemorrhage, nontraumatic [facility])  Hospital Problems: Principal Problem:   Subarachnoid hemorrhage, nontraumatic (HCC) Active Problems:   Viral encephalitis     Functional Problem List: Nursing Bladder, Bowel, Edema, Endurance, Medication Management, Nutrition, Pain, Safety, Skin Integrity  PT Balance, Behavior, Endurance, Motor, Perception, Safety  OT Balance, Endurance, Motor, Skin Integrity, Safety  SLP Cognition, Linguistic, Motor, Nutrition, Perception, Sensory, Endurance  TR         Basic ADL's: OT Bathing, Dressing, Toileting     Advanced  ADL's: OT       Transfers: PT Bed Mobility, Bed to Chair, Car, Occupational Psychologist, Research Scientist (life Sciences): PT Ambulation, Stairs     Additional Impairments: OT Fuctional Use of Upper Extremity  SLP Swallowing, Communication, Social Cognition expression Problem Solving, Attention, Memory, Awareness  TR      Anticipated Outcomes Item Anticipated Outcome  Self Feeding    Swallowing  supervision   Basic self-care  Supervision  Toileting  Supervision   Bathroom Transfers Supervision  Bowel/Bladder  manage bowels with medications/ manage bladder with toileting assistance  Transfers  supervision  Locomotion  supervision ambulatory  Communication  minA  Cognition  minA  Pain  <4 w/ prns  Safety/Judgment  manage safety with min assistance   Therapy Plan: PT Intensity: Minimum of 1-2 x/day ,45 to 90 minutes PT Frequency: 5 out of 7 days PT Duration Estimated Length of Stay: 7-10 days OT Intensity: Minimum of 1-2 x/day, 45 to 90 minutes OT Frequency: 5 out of 7 days OT Duration/Estimated Length of Stay: ~2 weeks SLP Intensity: Minumum of 1-2 x/day, 30 to 90 minutes SLP Frequency: 3 to 5 out of 7 days SLP Duration/Estimated Length of Stay: 10 days   Team  Interventions: Nursing Interventions Patient/Family Education, Bladder Management, Bowel Management, Disease Management/Prevention, Pain Management, Medication Management, Discharge Planning, Dysphagia/Aspiration Precaution Training, Cognitive Remediation/Compensation, Skin Care/Wound Management  PT interventions Ambulation/gait training, Discharge planning, Functional mobility training, Psychosocial support, Therapeutic Activities, Balance/vestibular training, Neuromuscular re-education, Therapeutic Exercise, Cognitive remediation/compensation, DME/adaptive equipment instruction, UE/LE Strength taining/ROM, Splinting/orthotics, Community reintegration, Equities Trader education, Museum/gallery curator, UE/LE Coordination activities  OT Interventions Warden/ranger, Cognitive remediation/compensation, Firefighter, Discharge planning, Disease mangement/prevention, Fish Farm Manager, Functional electrical stimulation, Functional mobility training, Neuromuscular re-education, Patient/family education, Psychosocial support, Self Care/advanced ADL retraining, Skin care/wound managment, Splinting/orthotics, Therapeutic Activities, Therapeutic Exercise, UE/LE Strength taining/ROM, UE/LE Coordination activities  SLP Interventions Cognitive remediation/compensation, Dysphagia/aspiration precaution training, Internal/external aids, Speech/Language facilitation, Cueing hierarchy, Oral motor exercises, Therapeutic Activities, Functional tasks, Patient/family education, Therapeutic Exercise, Environmental controls  TR Interventions    SW/CM Interventions Discharge Planning, Psychosocial Support, Patient/Family Education   Barriers to Discharge MD  Medical stability, Home enviroment access/loayout, Incontinence, Neurogenic bowel and bladder, Lack of/limited family support, Community Education Officer for SNF coverage, and Nutritional means  Nursing Home environment access/layout, Decreased caregiver  support, Incontinence Discharge: House  Discharge Home Layout: One level  Discharge Home Access: Stairs to enter  Entrance Stairs-Number of Steps: 1  PT Inaccessible home environment, Behavior decreased initiation, step to enter, min assist for mobility at this time  OT Home environment access/layout, Neurogenic Bowel & Bladder, Nutrition means    SLP Other (comments) advanced age, deconditioned state, pt motivation  SW Community Education Officer for SNF coverage     Team Discharge Planning: Destination: PT-Home ,OT- Home , SLP-Home Projected Follow-up: PT-Home health PT, OT-  Outpatient OT, SLP-24 hour  supervision/assistance, Home Health SLP, Outpatient SLP Projected Equipment Needs: PT-To be determined, OT- To be determined, SLP-None recommended by SLP Equipment Details: PT-owns RW, OT-  Patient/family involved in discharge planning: PT- Family member/caregiver, Patient,  OT-Patient, Family member/caregiver, SLP-Patient, Family member/caregiver  MD ELOS: 12-14 days Medical Rehab Prognosis:  Good Assessment: The patient has been admitted for CIR therapies with the diagnosis of 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 The team will be addressing functional mobility, strength, stamina, balance, safety, adaptive techniques and equipment, self-care, bowel and bladder mgt, patient and caregiver education,  . Goals have been set at [facility] A PT/OT Mod A SLP. Anticipated discharge destination is home.       See Team Conference Notes for weekly updates to the plan of care

## 2024-09-23 NOTE — Progress Notes (Signed)
 Physical Therapy Session Note  Patient Details  Name: TACUMA GRAFFAM MRN: [id number] Date of Birth: Jun 16, 1941  Today's Date: 09/23/2024 PT Individual Time: 1301-1410 PT Individual Time Calculation (min): 69 min   Short Term Goals: Week 1:  PT Short Term Goal 1 (Week 1): STG = LTG due to ELOS  Skilled Therapeutic Interventions/Progress Updates:  Pt was seen bedside in the pm with wife at bedside. Pt performed bed mobility with side rail and min A with increased time. Pt performed multiple sit to stand and stand pivot transfers with contact guard to min A with verbal cues. Pt performed step taps and alternating step taps, 3 sets x 10 reps each for LE strengthening. Pt performed B lower extremity exercises with 1.5 lbs on L LE and no weight non R LE, 3 sets x 10 reps each for hip flex and LAQs. Pt ambulated 75 feet x 2 with rolling walker and contact guard to min A. Pt returned to room. Pt transferred back in bed and left sitting up in bed with all needs within reach and bed alarm on.   Therapy Documentation Precautions:  Precautions Precautions: Fall Recall of Precautions/Restrictions: Impaired Precaution/Restrictions Comments: Cortrak Restrictions Weight Bearing Restrictions Per Provider Order: No General:   Pain: No c/o pain.   Therapy/Group: Individual Therapy  Blaise Grieshaber G 09/23/2024, 12:53 PM

## 2024-09-23 NOTE — Progress Notes (Signed)
 Speech Language Pathology Daily Session Note  Patient Details  Name: Noah Burnett MRN: [id number] Date of Birth: 02/03/1941  Today's Date: 09/23/2024 SLP Individual Time: 0800-0858 SLP Individual Time Calculation (min): 58 min  Short Term Goals: Week 1: SLP Short Term Goal 1 (Week 1): STGs = LTGs d/t ELOS  Skilled Therapeutic Interventions: Skilled therapy session focused on cognitive goals. SLP facilitated session by prompting patient to share information re: self, job and medical hx. Patient able to share long term memories independently and is oriented to self, location and time. Patient shared brief, however incomplete medical hx. SLP targeted problem solving goals through medication management task. Patient with significant frustration requiring consistent verbal cues for encouragement to continue tx. Patient completed task with minA and corrected mistakes independently. Patient provided current medication sheet per EPIC MAR. Patient left in bed with alarm set and call bell in reach. Continue POC.  Pain None reported   Therapy/Group: Individual Therapy  Harla Mensch M.A., CCC-SLP 09/23/2024, 7:35 AM

## 2024-09-24 ENCOUNTER — Inpatient Hospital Stay (HOSPITAL_COMMUNITY): Admitting: Registered Nurse

## 2024-09-24 ENCOUNTER — Inpatient Hospital Stay (HOSPITAL_COMMUNITY)

## 2024-09-24 ENCOUNTER — Encounter (HOSPITAL_COMMUNITY)
Admission: AD | Disposition: A | Discharge status: Home Health | Payer: Self-pay | Source: Home / Self Care | Attending: Internal Medicine

## 2024-09-24 ENCOUNTER — Encounter (HOSPITAL_COMMUNITY): Payer: Self-pay

## 2024-09-24 ENCOUNTER — Inpatient Hospital Stay (HOSPITAL_COMMUNITY)
Admission: AD | Admit: 2024-09-24 | Discharge: 2024-10-06 | DRG: 353 | Disposition: A | Discharge status: Home / Self Care | Attending: Internal Medicine | Admitting: Internal Medicine

## 2024-09-24 DIAGNOSIS — N401 Enlarged prostate with lower urinary tract symptoms: Secondary | ICD-10-CM | POA: Diagnosis present

## 2024-09-24 DIAGNOSIS — Z8661 Personal history of infections of the central nervous system: Secondary | ICD-10-CM

## 2024-09-24 DIAGNOSIS — K56609 Unspecified intestinal obstruction, unspecified as to partial versus complete obstruction: Secondary | ICD-10-CM | POA: Diagnosis not present

## 2024-09-24 DIAGNOSIS — Z8546 Personal history of malignant neoplasm of prostate: Secondary | ICD-10-CM

## 2024-09-24 DIAGNOSIS — K219 Gastro-esophageal reflux disease without esophagitis: Secondary | ICD-10-CM | POA: Diagnosis present

## 2024-09-24 DIAGNOSIS — E871 Hypo-osmolality and hyponatremia: Secondary | ICD-10-CM | POA: Diagnosis present

## 2024-09-24 DIAGNOSIS — R7303 Prediabetes: Secondary | ICD-10-CM | POA: Diagnosis not present

## 2024-09-24 DIAGNOSIS — R638 Other symptoms and signs concerning food and fluid intake: Secondary | ICD-10-CM | POA: Diagnosis not present

## 2024-09-24 DIAGNOSIS — J984 Other disorders of lung: Secondary | ICD-10-CM | POA: Diagnosis not present

## 2024-09-24 DIAGNOSIS — A86 Unspecified viral encephalitis: Secondary | ICD-10-CM | POA: Diagnosis not present

## 2024-09-24 DIAGNOSIS — Z4682 Encounter for fitting and adjustment of non-vascular catheter: Secondary | ICD-10-CM | POA: Diagnosis not present

## 2024-09-24 DIAGNOSIS — K869 Disease of pancreas, unspecified: Secondary | ICD-10-CM | POA: Diagnosis present

## 2024-09-24 DIAGNOSIS — Z681 Body mass index (BMI) 19 or less, adult: Secondary | ICD-10-CM | POA: Diagnosis not present

## 2024-09-24 DIAGNOSIS — J189 Pneumonia, unspecified organism: Secondary | ICD-10-CM | POA: Diagnosis not present

## 2024-09-24 DIAGNOSIS — K567 Ileus, unspecified: Secondary | ICD-10-CM | POA: Diagnosis not present

## 2024-09-24 DIAGNOSIS — J9601 Acute respiratory failure with hypoxia: Secondary | ICD-10-CM

## 2024-09-24 DIAGNOSIS — I5032 Chronic diastolic (congestive) heart failure: Secondary | ICD-10-CM | POA: Diagnosis present

## 2024-09-24 DIAGNOSIS — Z887 Allergy status to serum and vaccine status: Secondary | ICD-10-CM

## 2024-09-24 DIAGNOSIS — I110 Hypertensive heart disease with heart failure: Secondary | ICD-10-CM | POA: Diagnosis not present

## 2024-09-24 DIAGNOSIS — J90 Pleural effusion, not elsewhere classified: Secondary | ICD-10-CM | POA: Diagnosis not present

## 2024-09-24 DIAGNOSIS — E43 Unspecified severe protein-calorie malnutrition: Secondary | ICD-10-CM | POA: Diagnosis present

## 2024-09-24 DIAGNOSIS — K469 Unspecified abdominal hernia without obstruction or gangrene: Secondary | ICD-10-CM | POA: Diagnosis present

## 2024-09-24 DIAGNOSIS — L8995 Pressure ulcer of unspecified site, unstageable: Secondary | ICD-10-CM | POA: Diagnosis not present

## 2024-09-24 DIAGNOSIS — E861 Hypovolemia: Secondary | ICD-10-CM | POA: Diagnosis not present

## 2024-09-24 DIAGNOSIS — J690 Pneumonitis due to inhalation of food and vomit: Secondary | ICD-10-CM | POA: Diagnosis not present

## 2024-09-24 DIAGNOSIS — I10 Essential (primary) hypertension: Secondary | ICD-10-CM

## 2024-09-24 DIAGNOSIS — Z87891 Personal history of nicotine dependence: Secondary | ICD-10-CM

## 2024-09-24 DIAGNOSIS — Z515 Encounter for palliative care: Secondary | ICD-10-CM | POA: Diagnosis not present

## 2024-09-24 DIAGNOSIS — Z934 Other artificial openings of gastrointestinal tract status: Secondary | ICD-10-CM

## 2024-09-24 DIAGNOSIS — I609 Nontraumatic subarachnoid hemorrhage, unspecified: Secondary | ICD-10-CM | POA: Diagnosis not present

## 2024-09-24 DIAGNOSIS — K6389 Other specified diseases of intestine: Secondary | ICD-10-CM | POA: Diagnosis present

## 2024-09-24 DIAGNOSIS — J159 Unspecified bacterial pneumonia: Secondary | ICD-10-CM | POA: Diagnosis not present

## 2024-09-24 DIAGNOSIS — R338 Other retention of urine: Secondary | ICD-10-CM | POA: Diagnosis not present

## 2024-09-24 DIAGNOSIS — Z789 Other specified health status: Secondary | ICD-10-CM | POA: Diagnosis not present

## 2024-09-24 DIAGNOSIS — Z9911 Dependence on respirator [ventilator] status: Secondary | ICD-10-CM

## 2024-09-24 DIAGNOSIS — J9811 Atelectasis: Secondary | ICD-10-CM | POA: Diagnosis not present

## 2024-09-24 DIAGNOSIS — L8990 Pressure ulcer of unspecified site, unspecified stage: Secondary | ICD-10-CM

## 2024-09-24 DIAGNOSIS — R0602 Shortness of breath: Secondary | ICD-10-CM | POA: Diagnosis not present

## 2024-09-24 DIAGNOSIS — Z7189 Other specified counseling: Secondary | ICD-10-CM | POA: Diagnosis not present

## 2024-09-24 DIAGNOSIS — I700 Atherosclerosis of aorta: Secondary | ICD-10-CM | POA: Diagnosis not present

## 2024-09-24 DIAGNOSIS — E876 Hypokalemia: Secondary | ICD-10-CM | POA: Diagnosis not present

## 2024-09-24 DIAGNOSIS — R079 Chest pain, unspecified: Secondary | ICD-10-CM | POA: Diagnosis not present

## 2024-09-24 DIAGNOSIS — J9690 Respiratory failure, unspecified, unspecified whether with hypoxia or hypercapnia: Secondary | ICD-10-CM | POA: Diagnosis not present

## 2024-09-24 DIAGNOSIS — H9192 Unspecified hearing loss, left ear: Secondary | ICD-10-CM | POA: Diagnosis not present

## 2024-09-24 DIAGNOSIS — N281 Cyst of kidney, acquired: Secondary | ICD-10-CM | POA: Diagnosis not present

## 2024-09-24 DIAGNOSIS — K7689 Other specified diseases of liver: Secondary | ICD-10-CM | POA: Diagnosis not present

## 2024-09-24 DIAGNOSIS — R140 Abdominal distension (gaseous): Secondary | ICD-10-CM

## 2024-09-24 DIAGNOSIS — K450 Other specified abdominal hernia with obstruction, without gangrene: Secondary | ICD-10-CM | POA: Diagnosis not present

## 2024-09-24 DIAGNOSIS — Z79899 Other long term (current) drug therapy: Secondary | ICD-10-CM

## 2024-09-24 DIAGNOSIS — J95821 Acute postprocedural respiratory failure: Secondary | ICD-10-CM | POA: Diagnosis not present

## 2024-09-24 DIAGNOSIS — L89151 Pressure ulcer of sacral region, stage 1: Secondary | ICD-10-CM | POA: Diagnosis present

## 2024-09-24 DIAGNOSIS — Z8673 Personal history of transient ischemic attack (TIA), and cerebral infarction without residual deficits: Secondary | ICD-10-CM

## 2024-09-24 DIAGNOSIS — R918 Other nonspecific abnormal finding of lung field: Secondary | ICD-10-CM | POA: Diagnosis not present

## 2024-09-24 HISTORY — PX: LAPAROTOMY: SHX154

## 2024-09-24 LAB — POCT I-STAT 7, (LYTES, BLD GAS, ICA,H+H)
Acid-Base Excess: 0 mmol/L (ref 0.0–2.0)
Bicarbonate: 25 mmol/L (ref 20.0–28.0)
Calcium, Ion: 1.07 mmol/L — ABNORMAL LOW (ref 1.15–1.40)
HCT: 32 % — ABNORMAL LOW (ref 39.0–52.0)
Hemoglobin: 10.9 g/dL — ABNORMAL LOW (ref 13.0–17.0)
O2 Saturation: 97 %
Patient temperature: 35.7
Potassium: 4 mmol/L (ref 3.5–5.1)
Sodium: 132 mmol/L — ABNORMAL LOW (ref 135–145)
TCO2: 26 mmol/L (ref 22–32)
pCO2 arterial: 37.1 mmHg (ref 32–48)
pH, Arterial: 7.432 (ref 7.35–7.45)
pO2, Arterial: 88 mmHg (ref 83–108)

## 2024-09-24 LAB — GLUCOSE, CAPILLARY
Glucose-Capillary: 116 mg/dL — ABNORMAL HIGH (ref 70–99)
Glucose-Capillary: 139 mg/dL — ABNORMAL HIGH (ref 70–99)
Glucose-Capillary: 146 mg/dL — ABNORMAL HIGH (ref 70–99)
Glucose-Capillary: 156 mg/dL — ABNORMAL HIGH (ref 70–99)
Glucose-Capillary: 173 mg/dL — ABNORMAL HIGH (ref 70–99)
Glucose-Capillary: 176 mg/dL — ABNORMAL HIGH (ref 70–99)
Glucose-Capillary: 186 mg/dL — ABNORMAL HIGH (ref 70–99)

## 2024-09-24 LAB — CBC WITH DIFFERENTIAL/PLATELET
Basophils Absolute: 0 K/uL (ref 0.0–0.1)
Basophils Relative: 0 %
Eosinophils Absolute: 0 K/uL (ref 0.0–0.5)
Eosinophils Relative: 0 %
HCT: 36.6 % — ABNORMAL LOW (ref 39.0–52.0)
Hemoglobin: 12.9 g/dL — ABNORMAL LOW (ref 13.0–17.0)
Lymphocytes Relative: 3 %
Lymphs Abs: 0.2 K/uL — ABNORMAL LOW (ref 0.7–4.0)
MCH: 31 pg (ref 26.0–34.0)
MCHC: 35.2 g/dL (ref 30.0–36.0)
MCV: 88 fL (ref 80.0–100.0)
Monocytes Absolute: 0.3 K/uL (ref 0.1–1.0)
Monocytes Relative: 5 %
Neutro Abs: 6.3 K/uL (ref 1.7–7.7)
Neutrophils Relative %: 92 %
Platelets: 281 K/uL (ref 150–400)
RBC: 4.16 MIL/uL — ABNORMAL LOW (ref 4.22–5.81)
RDW: 17.9 % — ABNORMAL HIGH (ref 11.5–15.5)
WBC: 6.9 K/uL (ref 4.0–10.5)
nRBC: 0 % (ref 0.0–0.2)

## 2024-09-24 LAB — BASIC METABOLIC PANEL WITH GFR
Anion gap: 12 (ref 5–15)
BUN: 20 mg/dL (ref 8–23)
CO2: 25 mmol/L (ref 22–32)
Calcium: 7.3 mg/dL — ABNORMAL LOW (ref 8.9–10.3)
Chloride: 93 mmol/L — ABNORMAL LOW (ref 98–111)
Creatinine, Ser: 0.56 mg/dL — ABNORMAL LOW (ref 0.61–1.24)
GFR, Estimated: >60 mL/min (ref >60)
Glucose, Bld: 167 mg/dL — ABNORMAL HIGH (ref 70–99)
Potassium: 3.9 mmol/L (ref 3.5–5.1)
Sodium: 130 mmol/L — ABNORMAL LOW (ref 135–145)

## 2024-09-24 LAB — BRAIN NATRIURETIC PEPTIDE: B Natriuretic Peptide: 111.6 pg/mL — ABNORMAL HIGH (ref 0.0–100.0)

## 2024-09-24 LAB — MAGNESIUM: Magnesium: 1.6 mg/dL — ABNORMAL LOW (ref 1.7–2.4)

## 2024-09-24 LAB — COMPREHENSIVE METABOLIC PANEL WITH GFR
ALT: 34 U/L (ref 0–44)
AST: 19 U/L (ref 15–41)
Albumin: 2.5 g/dL — ABNORMAL LOW (ref 3.5–5.0)
Alkaline Phosphatase: 54 U/L (ref 38–126)
Anion gap: 15 (ref 5–15)
BUN: 23 mg/dL (ref 8–23)
CO2: 21 mmol/L — ABNORMAL LOW (ref 22–32)
Calcium: 7.5 mg/dL — ABNORMAL LOW (ref 8.9–10.3)
Chloride: 96 mmol/L — ABNORMAL LOW (ref 98–111)
Creatinine, Ser: 0.53 mg/dL — ABNORMAL LOW (ref 0.61–1.24)
GFR, Estimated: >60 mL/min (ref >60)
Glucose, Bld: 182 mg/dL — ABNORMAL HIGH (ref 70–99)
Potassium: 4 mmol/L (ref 3.5–5.1)
Sodium: 132 mmol/L — ABNORMAL LOW (ref 135–145)
Total Bilirubin: 1.1 mg/dL (ref 0.0–1.2)
Total Protein: 4.7 g/dL — ABNORMAL LOW (ref 6.5–8.1)

## 2024-09-24 LAB — PHOSPHORUS: Phosphorus: 4.4 mg/dL (ref 2.5–4.6)

## 2024-09-24 LAB — MRSA NEXT GEN BY PCR, NASAL: MRSA by PCR Next Gen: NOT DETECTED

## 2024-09-24 LAB — CG4 I-STAT (LACTIC ACID)
Lactic Acid, Venous: 1.2 mmol/L (ref 0.5–1.9)
Lactic Acid, Venous: 2.1 mmol/L (ref 0.5–1.9)

## 2024-09-24 SURGERY — LAPAROTOMY, EXPLORATORY
Anesthesia: General | Site: Abdomen

## 2024-09-24 MED ORDER — HYDROMORPHONE HCL 1 MG/ML IJ SOLN
INTRAMUSCULAR | Status: AC
  Filled 2024-09-24: qty 0.5

## 2024-09-24 MED ORDER — LORAZEPAM 2 MG/ML IJ SOLN
INTRAMUSCULAR | Status: AC
  Administered 2024-09-24: 0.25 mg via INTRAVENOUS
  Filled 2024-09-24: qty 1

## 2024-09-24 MED ORDER — HYDROMORPHONE HCL 1 MG/ML IJ SOLN
INTRAMUSCULAR | Status: DC | PRN
  Administered 2024-09-24: .5 mg via INTRAVENOUS

## 2024-09-24 MED ORDER — FENTANYL CITRATE (PF) 250 MCG/5ML IJ SOLN
INTRAMUSCULAR | Status: DC | PRN
  Administered 2024-09-24 (×4): 25 ug via INTRAVENOUS

## 2024-09-24 MED ORDER — ALBUMIN HUMAN 5 % IV SOLN: INTRAVENOUS | Status: DC | PRN

## 2024-09-24 MED ORDER — LIDOCAINE 2% (20 MG/ML) 5 ML SYRINGE
INTRAMUSCULAR | Status: DC | PRN
  Administered 2024-09-24: 40 mg via INTRAVENOUS

## 2024-09-24 MED ORDER — LIDOCAINE 2% (20 MG/ML) 5 ML SYRINGE
INTRAMUSCULAR | Status: AC
  Filled 2024-09-24: qty 5

## 2024-09-24 MED ORDER — ROCURONIUM BROMIDE 10 MG/ML (PF) SYRINGE
PREFILLED_SYRINGE | INTRAVENOUS | Status: DC | PRN
  Administered 2024-09-24: 20 mg via INTRAVENOUS
  Administered 2024-09-24: 50 mg via INTRAVENOUS

## 2024-09-24 MED ORDER — PROPOFOL 10 MG/ML IV BOLUS
INTRAVENOUS | Status: DC | PRN
  Administered 2024-09-24: 70 mg via INTRAVENOUS

## 2024-09-24 MED ORDER — IOHEXOL 12 MG/ML PO SOLN: 500.0000 mL | ORAL | Status: AC

## 2024-09-24 MED ORDER — FENTANYL CITRATE (PF) 100 MCG/2ML IJ SOLN
INTRAMUSCULAR | Status: AC
  Filled 2024-09-24: qty 2

## 2024-09-24 MED ORDER — ONDANSETRON HCL 4 MG/2ML IJ SOLN
INTRAMUSCULAR | Status: DC | PRN
  Administered 2024-09-24: 4 mg via INTRAVENOUS

## 2024-09-24 MED ORDER — PROPOFOL 10 MG/ML IV BOLUS
INTRAVENOUS | Status: AC
Start: 2024-09-24 — End: 2024-09-24
  Filled 2024-09-24: qty 20

## 2024-09-24 MED ORDER — PIPERACILLIN-TAZOBACTAM 3.375 G IVPB
3.3750 g | Freq: Three times a day (TID) | INTRAVENOUS | Status: DC
  Administered 2024-09-24 – 2024-09-26 (×5): 3.375 g via INTRAVENOUS
  Filled 2024-09-24 (×5): qty 50

## 2024-09-24 MED ORDER — PHENYLEPHRINE 80 MCG/ML (10ML) SYRINGE FOR IV PUSH (FOR BLOOD PRESSURE SUPPORT)
PREFILLED_SYRINGE | INTRAVENOUS | Status: DC | PRN
  Administered 2024-09-24 (×3): 80 ug via INTRAVENOUS

## 2024-09-24 MED ORDER — IOHEXOL 9 MG/ML PO SOLN
ORAL | Status: AC
  Filled 2024-09-24: qty 1000

## 2024-09-24 MED ORDER — LACTATED RINGERS IV SOLN: INTRAVENOUS | Status: DC | PRN

## 2024-09-24 MED ORDER — POLYETHYLENE GLYCOL 3350 17 G PO PACK: 17.0000 g | PACK | Freq: Every day | ORAL | Status: DC | PRN

## 2024-09-24 MED ORDER — DOCUSATE SODIUM 100 MG PO CAPS
100.0000 mg | ORAL_CAPSULE | Freq: Two times a day (BID) | ORAL | Status: DC | PRN
  Administered 2024-09-28 (×2): 100 mg via ORAL
  Filled 2024-09-24 (×2): qty 1

## 2024-09-24 MED ORDER — ONDANSETRON HCL 4 MG/2ML IJ SOLN
INTRAMUSCULAR | Status: AC
  Filled 2024-09-24: qty 2

## 2024-09-24 MED ORDER — CHLORHEXIDINE GLUCONATE CLOTH 2 % EX PADS
6.0000 | MEDICATED_PAD | Freq: Every day | CUTANEOUS | Status: DC
  Administered 2024-09-24 – 2024-10-06 (×14): 6 via TOPICAL

## 2024-09-24 MED ORDER — IOHEXOL 350 MG/ML SOLN
75.0000 mL | Freq: Once | INTRAVENOUS | Status: AC | PRN
  Administered 2024-09-24: 75 mL via INTRAVENOUS

## 2024-09-24 MED ORDER — PROPOFOL 1000 MG/100ML IV EMUL
0.0000 ug/kg/min | INTRAVENOUS | Status: DC
Start: 2024-09-24 — End: 2024-09-25
  Administered 2024-09-24: 10 ug/kg/min via INTRAVENOUS
  Administered 2024-09-25: 20 ug/kg/min via INTRAVENOUS
  Filled 2024-09-24: qty 100

## 2024-09-24 MED ORDER — ORAL CARE MOUTH RINSE: 15.0000 mL | OROMUCOSAL | Status: DC | PRN

## 2024-09-24 MED ORDER — FENTANYL 2500MCG IN NS 250ML (10MCG/ML) PREMIX INFUSION
0.0000 ug/h | INTRAVENOUS | Status: DC
  Administered 2024-09-24: 25 ug/h via INTRAVENOUS
  Filled 2024-09-24: qty 250

## 2024-09-24 MED ORDER — LORAZEPAM 2 MG/ML IJ SOLN
0.2500 mg | Freq: Once | INTRAMUSCULAR | Status: AC
  Administered 2024-09-24: 0.25 mg via INTRAVENOUS
  Filled 2024-09-24: qty 1

## 2024-09-24 MED ORDER — FENTANYL CITRATE (PF) 50 MCG/ML IJ SOSY
50.0000 ug | PREFILLED_SYRINGE | INTRAMUSCULAR | Status: DC | PRN
  Administered 2024-09-24: 50 ug via INTRAVENOUS
  Filled 2024-09-24: qty 1

## 2024-09-24 MED ORDER — PHENYLEPHRINE HCL-NACL 20-0.9 MG/250ML-% IV SOLN
INTRAVENOUS | Status: DC | PRN
  Administered 2024-09-24: 40 ug/min via INTRAVENOUS

## 2024-09-24 MED ORDER — ORAL CARE MOUTH RINSE
15.0000 mL | OROMUCOSAL | Status: DC
  Administered 2024-09-24 – 2024-09-25 (×8): 15 mL via OROMUCOSAL

## 2024-09-24 MED ORDER — POLYETHYLENE GLYCOL 3350 17 G PO PACK
17.0000 g | PACK | Freq: Every day | ORAL | Status: DC
  Filled 2024-09-24: qty 1

## 2024-09-24 MED ORDER — PIPERACILLIN-TAZOBACTAM 3.375 G IVPB 30 MIN
3.3750 g | Freq: Three times a day (TID) | INTRAVENOUS | Status: AC
  Administered 2024-09-24: 3.375 g via INTRAVENOUS

## 2024-09-24 MED ORDER — DOCUSATE SODIUM 50 MG/5ML PO LIQD
100.0000 mg | Freq: Two times a day (BID) | ORAL | Status: DC
  Administered 2024-09-25 – 2024-09-27 (×4): 100 mg
  Filled 2024-09-24 (×5): qty 10

## 2024-09-24 MED ORDER — ROCURONIUM BROMIDE 10 MG/ML (PF) SYRINGE
PREFILLED_SYRINGE | INTRAVENOUS | Status: AC
  Filled 2024-09-24: qty 10

## 2024-09-24 MED ORDER — DEXTROSE-SODIUM CHLORIDE 5-0.45 % IV SOLN: INTRAVENOUS | Status: DC

## 2024-09-24 SURGICAL SUPPLY — 28 items
BAG COUNTER SPONGE SURGICOUNT (BAG) ×2 IMPLANT
BLADE CLIPPER SURG (BLADE) IMPLANT
CHLORAPREP W/TINT 26 (MISCELLANEOUS) ×2 IMPLANT
COVER SURGICAL LIGHT HANDLE (MISCELLANEOUS) ×2 IMPLANT
DRAPE LAPAROSCOPIC ABDOMINAL (DRAPES) ×2 IMPLANT
DRAPE WARM FLUID 44X44 (DRAPES) ×2 IMPLANT
DRSG OPSITE POSTOP 4X8 (GAUZE/BANDAGES/DRESSINGS) IMPLANT
ELECTRODE REM PT RTRN 9FT ADLT (ELECTROSURGICAL) ×2 IMPLANT
GLOVE BIO SURGEON STRL SZ7.5 (GLOVE) ×2 IMPLANT
GLOVE INDICATOR 8.0 STRL GRN (GLOVE) ×2 IMPLANT
GOWN STRL REUS W/ TWL LRG LVL3 (GOWN DISPOSABLE) ×2 IMPLANT
GOWN STRL REUS W/ TWL XL LVL3 (GOWN DISPOSABLE) ×2 IMPLANT
HANDLE SUCTION POOLE (INSTRUMENTS) ×2 IMPLANT
KIT BASIN OR (CUSTOM PROCEDURE TRAY) ×2 IMPLANT
KIT TURNOVER KIT B (KITS) ×2 IMPLANT
PACK GENERAL/GYN (CUSTOM PROCEDURE TRAY) ×2 IMPLANT
PAD ARMBOARD POSITIONER FOAM (MISCELLANEOUS) ×2 IMPLANT
SOLN 0.9% NACL POUR BTL 1000ML (IV SOLUTION) ×4 IMPLANT
SPONGE T-LAP 18X18 ~~LOC~~+RFID (SPONGE) IMPLANT
STAPLER SKIN PROX 35W (STAPLE) ×2 IMPLANT
SUT PDS AB 1 CT1 36 (SUTURE) IMPLANT
SUT SILK 2 0 SH (SUTURE) IMPLANT
SUT SILK 2 0 SH CR/8 (SUTURE) ×2 IMPLANT
SUT SILK 2 0 TIES 10X30 (SUTURE) ×2 IMPLANT
SUT SILK 3 0 SH CR/8 (SUTURE) ×2 IMPLANT
SUT SILK 3 0 TIES 10X30 (SUTURE) ×2 IMPLANT
TOWEL GREEN STERILE (TOWEL DISPOSABLE) ×2 IMPLANT
YANKAUER SUCT BULB TIP NO VENT (SUCTIONS) IMPLANT

## 2024-09-24 NOTE — Anesthesia Preprocedure Evaluation (Addendum)
 Anesthesia Evaluation  Patient identified by MRN, date of birth, ID band Patient awake    Reviewed: Unable to perform ROS - Chart review onlyPreop documentation limited or incomplete due to emergent nature of procedure.  Airway Mallampati: I  TM Distance: >3 FB Neck ROM: Full    Dental  (+) Dental Advisory Given, Edentulous Upper, Edentulous Lower   Pulmonary former smoker   breath sounds clear to auscultation       Cardiovascular hypertension, Pt. on medications  Rhythm:Regular Rate:Normal  Echo:  1. Left ventricular ejection fraction, by estimation, is 55%. Left  ventricular ejection fraction by 3D volume is 52 %. The left ventricle has  normal function. The left ventricle has no regional wall motion  abnormalities. Left ventricular diastolic  parameters are consistent with Grade I diastolic dysfunction (impaired  relaxation). The average left ventricular global longitudinal strain is  -13.3 %.   2. Right ventricular systolic function is normal. The right ventricular  size is normal. Tricuspid regurgitation signal is inadequate for assessing  PA pressure.   3. The mitral valve is normal in structure. Trivial mitral valve  regurgitation. No evidence of mitral stenosis.   4. The aortic valve is tricuspid. Aortic valve regurgitation is mild. No  aortic stenosis is present.   5. The inferior vena cava is normal in size with greater than 50%  respiratory variability, suggesting right atrial pressure of 3 mmHg.      Neuro/Psych    GI/Hepatic Neg liver ROS,GERD  Medicated,,  Endo/Other  negative endocrine ROS    Renal/GU negative Renal ROS     Musculoskeletal  (+) Arthritis ,    Abdominal   Peds  Hematology negative hematology ROS (+)   Anesthesia Other Findings   Reproductive/Obstetrics                              Anesthesia Physical Anesthesia Plan  ASA: 3 and  emergent  Anesthesia Plan: General   Post-op Pain Management: Ofirmev  IV (intra-op)*   Induction: Intravenous  PONV Risk Score and Plan: 3 and Ondansetron , Dexamethasone  and Midazolam   Airway Management Planned: Oral ETT  Additional Equipment: Arterial line  Intra-op Plan:   Post-operative Plan: Extubation in OR  Informed Consent: I have reviewed the patients History and Physical, chart, labs and discussed the procedure including the risks, benefits and alternatives for the proposed anesthesia with the patient or authorized representative who has indicated his/her understanding and acceptance.     Dental advisory given  Plan Discussed with: CRNA  Anesthesia Plan Comments:          Anesthesia Quick Evaluation

## 2024-09-24 NOTE — Consult Note (Signed)
 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 Abdominal distention workup showed finding concerning for bowel ischemia in the setting of a prior gastrojejunostomy.  Imaging showed evidence of free peritoneal air with multiple mid to distal dilated small bowel with moderate diffuse pneumatosis.  Otherwise there is no PE, moderate bilateral pleural effusion was appreciated. On 11/16 patient was taken to the OR for ischemic bowel with perforation and underwent ex lap with reduction and repair of internal hernia with postop findings of internal hernia containing small bowel, and diffuse pneumatosis intestinalis  Pertinent  Medical History  CVA, tobacco use, HTN, robotic gastrojejunostomy for pancreatic head mass  Significant Hospital Events: Including procedures, antibiotic start and stop dates in addition to other pertinent events   11/16 ex lap with reduction and repair of internal hernia with postop findings of internal hernia containing small bowel, diffuse pneumatosis intestinalis  Interim History / Subjective:  As above  Objective    Pulse 93, resp. rate 20, height 5' 7.01 (1.702 m), SpO2 98%.    Vent Mode: PRVC FiO2 (%):  [50 %] 50 % Set Rate:  [20 bmp] 20 bmp Vt Set:  [530 mL] 530  mL PEEP:  [5 cmH20] 5 cmH20 Plateau Pressure:  [16 cmH20] 16 cmH20   Intake/Output Summary (Last 24 hours) at 09/24/2024 1809 Last data filed at 09/24/2024 1728 Gross per 24 hour  Intake 1700 ml  Output 1010 ml  Net 690 ml   There were no vitals filed for this visit.  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 -History of prostate CA - Tobacco use - HTN  Plan - ICU monitoring and management - Postprocedure CXR, EKG, labs - Wean off vent as tolerated to maintain sats> 92% - Wean off sedation as tolerated - Strict I&O's - NG to LIS - General Surgery team on board      Labs   CBC: Recent Labs  Lab 09/22/24 0526  WBC 9.6  NEUTROABS 8.3*  HGB 12.5*  HCT 36.3*  MCV 88.8  PLT 208    Basic Metabolic Panel: Recent Labs  Lab 09/18/24 0254 09/18/24  9744 09/19/24 0159 09/20/24 0140 09/21/24 0501 09/21/24 1955 09/22/24 0526 09/23/24 0430 09/24/24 0535  NA 124*  --   123* 123* 124*  --  132* 130* 130*  K 3.2*  --  3.5 3.2* 3.3*  --  4.8 3.9 3.9  CL 94*  --  92* 90* 90*  --  100 97* 93*  CO2 21*  --  20* 22 25  --  22 23 25   GLUCOSE 164*  --  196* 183* 176*  --  136* 128* 167*  BUN 13  --  14 16 30*  --  23 18 20   CREATININE 0.53*  --  0.57* 0.46* 0.54* 0.57* 0.53* 0.48* 0.56*  CALCIUM 7.4*  --  7.1* 7.0* 6.6*  --  7.1* 7.1* 7.3*  MG  --  1.6* 2.0  --   --   --   --   --   --   PHOS 2.7 2.6 2.0* <1.0* 2.2*  --   --   --   --    GFR: Estimated Creatinine Clearance: 50.6 mL/min (A) (by C-G formula based on SCr of 0.56 mg/dL (L)). Recent Labs  Lab 09/22/24 0526  WBC 9.6    Liver Function Tests: Recent Labs  Lab 09/18/24 0254 09/19/24 0159 09/20/24 0140 09/21/24 0501 09/22/24 0526  AST  --   --   --   --  18  ALT  --   --   --   --  26  ALKPHOS  --   --   --   --  48  BILITOT  --   --   --   --  0.7  PROT  --   --   --   --  4.1*  ALBUMIN 2.3* 2.4* 2.4* 2.2* 2.0*   No results for input(s): LIPASE, AMYLASE in the last 168 hours. No results for input(s): AMMONIA in the last 168 hours.  ABG    Component Value Date/Time   TCO2 28 02/01/2020 0947     Coagulation Profile: No results for input(s): INR, PROTIME in the last 168 hours.  Cardiac Enzymes: No results for input(s): CKTOTAL, CKMB, CKMBINDEX, TROPONINI in the last 168 hours.  HbA1C: Hgb A1c MFr Bld  Date/Time Value Ref Range Status  09/10/2024 02:55 AM 5.6 4.8 - 5.6 % Final    Comment:    (NOTE) Diagnosis of Diabetes The following HbA1c ranges recommended by the American Diabetes Association (ADA) may be used as an aid in the diagnosis of diabetes mellitus.  Hemoglobin             Suggested A1C NGSP%              Diagnosis  <5.7                   Non Diabetic  5.7-6.4                Pre-Diabetic  >6.4                   Diabetic  <7.0                   Glycemic control for                       adults with diabetes.      CBG: Recent Labs   Lab 09/23/24 2110 09/24/24 0018 09/24/24 0413 09/24/24 0816 09/24/24 0901  GLUCAP 156* 173* 156*  176* 186*    Review of Systems:   Negative except above  Past Medical History:  He,  has a past medical history of Arthritis, Bladder tumor, Deafness, left, Full dentures, GERD (gastroesophageal reflux disease), History of [place] spotted fever, Hyperplasia of prostate with lower urinary tract symptoms (LUTS), Hypertension, Pre-diabetes, Prostate cancer [facility]) (urologist--- dr watt), Shingles, Wears glasses, and Wears hearing aid in right ear.   Surgical History:   Past Surgical History:  Procedure Laterality Date   SHOULDER ARTHROSCOPY WITH ROTATOR CUFF REPAIR AND SUBACROMIAL DECOMPRESSION Right 05-20-2009  @[facility]    TRANSURETHRAL RESECTION OF BLADDER TUMOR WITH MITOMYCIN -C N/A 02/01/2020   Procedure: TRANSURETHRAL RESECTION OF BLADDER TUMOR;  Surgeon: Watt Rush, MD;  Location: [facility];  Service: Urology;  Laterality: N/A;   TRANSURETHRAL RESECTION OF PROSTATE       Social History:   reports that he quit smoking about 25 years ago. His smoking use included cigarettes. He started smoking about 61 years ago. He has a 36 pack-year smoking history. He has been exposed to tobacco smoke. He has never used smokeless tobacco. He reports that he does not currently use alcohol. He reports that he does not use drugs.   Family History:  His family history is not on file.   Allergies Allergies  Allergen Reactions   Haemophilus Influenzae Vaccines Other (See Comments)    I get deathly sick     Home Medications  Prior to Admission medications   Medication Sig Start Date End Date Taking? Authorizing Provider  acetaminophen  (TYLENOL ) 500 MG tablet Take 1,000 mg by mouth every 6 (six) hours as needed for headache or mild pain (pain score 1-3).    [provider]  amLODipine (NORVASC) 5 MG tablet Take 1 tablet (5 mg total) by mouth daily. 09/22/24   Vernon Ranks, MD  docusate sodium (COLACE) 100 MG capsule Take 100 mg by mouth 2 (two) times daily.    [provider]  furosemide (LASIX) 20 MG tablet Take 1 tablet (20 mg total) by mouth daily. 09/21/24 09/21/25  Pahwani, Ravi, MD  megestrol (MEGACE) 40 MG/ML suspension Take 400 mg by mouth every morning. 09/04/24   [provider]  omeprazole (PRILOSEC) 40 MG capsule Take 40 mg by mouth daily. 02/16/23   [provider]  polyethylene glycol (MIRALAX / GLYCOLAX) 17 g packet Take 17 g by mouth daily.    [provider]  rosuvastatin (CRESTOR) 10 MG tablet Take 1 tablet (10 mg total) by mouth daily. 09/22/24   Vernon Ranks, MD  sodium chloride  1 g tablet Take 2 tablets (2 g total) by mouth 3 (three) times daily with meals for 7 days. 09/21/24 09/28/24  Pahwani, Ravi, MD  tamsulosin (FLOMAX) 0.4 MG CAPS capsule Take 1 capsule (0.4 mg total) by mouth daily after supper. 09/21/24 10/21/24  Vernon Ranks, MD  urea  (URE-NA) 15 g PACK oral packet Take 15 g by mouth 2 (two) times daily for 3 days. 09/21/24 09/24/24  Vernon Ranks, MD     Critical care time:    Lenny Sharie ROLLA [facility] Critical Care Prefer epic messenger for cross cover needs     Critical care time was exclusive of separately billable procedures and treating other patients.  Critical care was necessary to treat or prevent imminent or life-threatening deterioration.  Critical care was time spent personally by me on the following activities: development of treatment plan with patient and/or surrogate as well as nursing, discussions with  consultants, evaluation of patient's response to treatment, examination of patient, obtaining history from patient or surrogate, ordering and performing treatments and interventions, ordering and review of laboratory studies, ordering and review of radiographic studies, pulse oximetry, re-evaluation of patient's condition and participation in  multidisciplinary rounds.

## 2024-09-24 NOTE — Anesthesia Procedure Notes (Addendum)
 Procedure Name: Intubation Date/Time: 09/24/2024 4:06 PM  Performed by: Christopher Comings, CRNAPre-anesthesia Checklist: Patient identified, Emergency Drugs available, Suction available and Patient being monitored Patient Re-evaluated:Patient Re-evaluated prior to induction Oxygen Delivery Method: Circle system utilized Preoxygenation: Pre-oxygenation with 100% oxygen Induction Type: IV induction Ventilation: Mask ventilation without difficulty Laryngoscope Size: Mac and 3 Grade View: Grade I Tube type: Oral Tube size: 7.5 mm Number of attempts: 1 Airway Equipment and Method: Stylet and Oral airway Placement Confirmation: ETT inserted through vocal cords under direct vision, positive ETCO2 and breath sounds checked- equal and bilateral Secured at: 22 cm Tube secured with: Tape Dental Injury: Teeth and Oropharynx as per pre-operative assessment

## 2024-09-24 NOTE — Anesthesia Postprocedure Evaluation (Signed)
 Anesthesia Post Note  Patient: Noah Burnett  Procedure(s) Performed: LAPAROTOMY, EXPLORATORY; REDUCTION AND REPAIR OF INTERNAL HERNIA (Abdomen)     Patient location during evaluation: SICU Anesthesia Type: General Level of consciousness: sedated Pain management: pain level controlled Vital Signs Assessment: post-procedure vital signs reviewed and stable Respiratory status: patient remains intubated per anesthesia plan Cardiovascular status: stable Postop Assessment: no apparent nausea or vomiting Anesthetic complications: no   There were no known notable events for this encounter.  Last Vitals:  Vitals:   09/24/24 1845 09/24/24 1900  BP: (!) 149/88 (!) 143/88  Pulse: (!) 109 (!) 103  Resp: 20 20  Temp:    SpO2: 99% 99%    Last Pain: There were no vitals filed for this visit.               Noah Burnett

## 2024-09-24 NOTE — Progress Notes (Signed)
 eLink Physician-Brief Progress Note Patient Name: Noah Burnett DOB: September 14, 1941 MRN: [id number]   Date of Service  09/24/2024  HPI/Events of Note  Status post exploratory laparotomy with reduction and repair of hernia 11/16 Has vent dependent respiratory failure  eICU Interventions  Add fentanyl  bolus as needed Add RASS score monitoring and standard pain and analgesia order set Maintain mechanical ventilation, daily weaning trials in the morning   0259 -request for CBGs.  Add CBGs every 8 hours, monitor for hypoglycemia.  Given appropriate glycemic control, no indication for SSI at this time  Intervention Category Intermediate Interventions: Respiratory distress - evaluation and management  Declyn Offield 09/24/2024, 8:01 PM

## 2024-09-24 NOTE — Op Note (Signed)
 09/24/2024  5:45 PM  PATIENT:  Noah Burnett  83 y.o. male  Patient Care Team: [name], MD as PCP - General (Internal Medicine)  PRE-OPERATIVE DIAGNOSIS:  Possible ischemic bowel with perforation  POST-OPERATIVE DIAGNOSIS:   Internal hernia containing small bowel Diffuse pneumatosis intestinalis  PROCEDURE: Exploratory laparotomy with reduction and repair of internal hernia.  SURGEON:  Lonni HERO. Antolin Belsito, MD  ASSISTANT: OR Staff  ANESTHESIA:   general  COUNTS:  Sponge, needle and instrument counts were reported correct x2 at the conclusion of the operation.  EBL: 10 mL  DRAINS: none  SPECIMEN: none  COMPLICATIONS: none  FINDINGS: Diffuse pneumatosis intestinalis involving almost the entire small bowel and small bowel mesentery.  I suspect some of this was what contributed to the free air seen on CT scan.  Small bowel containing internal hernia underneath the gastrojejunostomy.  Involved bowel is erythematous and edematous but after reduction, clearly completely viable.  GJ defect was carefully closed.  DISPOSITION: ICU in critical but stable condition.  DESCRIPTION:  The patient was seen in the pre-op holding area. The risks, benefits, complications, treatment options, and expected outcomes were previously discussed with the patient's son as he is obtunded. The patient's son agreed with the proposed plan and has signed the informed consent form. The patient was brought to the operating room by the surgical team, identified as Noah JUDITHANN Burnett, and the procedure verified. placed supine on the operating table and SCD's were applied. General anesthesia was induced without difficulty. He was positioned supine.  Pressure points were evaluated and padded.  A foley catheter was already in place. Hair on the abdomen was clipped.  He was secured to the operating table. The abdomen was then prepped and draped in the standard sterile fashion. A time out was completed and the  above information confirmed and need for preoperative antibiotics.  We began with a midline laparotomy.  Skin is incised sharply with a scalpel.  Subcutaneous tissue device electrocautery.  The fascia is incised sharply.  The peritoneum was cautiously entered using Metzenbaum scissors.  There is apparent pneumoperitoneum.  There is no foul-smelling or turbulent ascites.  The wound is opened to its full extent.  The liver and stomach are normal in appearance.  There is an apparent GJ anastomosis.  There is multiple loops of red erythematous injected small bowel to the patient's left of the GJ anastomosis going underneath it.  There is numerous loops of bowel on the right side which are pink and normal in appearance.  There is diffuse pneumatosis intestinalis.  This involves the small bowel and portions of the mesentery bordering the small bowel.  I suspect this was actually the source of the free air.  The colon is normal in appearance.  We began by reducing the injected erythematous loops of small bowel back underneath the GJ anastomosis.  In doing so, although injected, they began to turn more pink in color.  They are clearly viable.  There is a palpable pulse in the mesentery going out to these loops of bowel.  We then went to the ileocecal valve.  The small bowel was then run in a retrograde manner all the way back to the GJ anastomosis.  This is antecolic in configuration.  It appears patent.  Proximal to this, there is proximal jejunum which is able to be traced back underneath the transverse mesocolon to D4.  All this is also viable and healthy in appearance.  All bowel is no to  the right of the GJ.  There is no bowel extending through the defect aside from the pre-existing bypass configuration.  The mesentery is inspected and oriented.  There is no twisting.  I also discussed the case over the phone with one of my bariatric colleagues, Dr. Stevie, to ensure that the anatomy that we are currently  visualizing was the proper anatomy in the setting.  We discussed closure of the defect.  Given that he had herniated bowel through this, seems prudent to attempt to close this defect if at all possible.  This was therefore done using interrupted 3-0 silk sutures carefully approximating the mesentery to transverse colon omentum and using omentum to attempt to make the defect as small as we could.  We were able to visibly obliterate this defect.  All bowel was then reinspected.  Orientation confirmed.  It is all pink in color.  The erythematous segment is peristalsing nicely.  There is a palpable pulse in the mesentery going all the way out to the terminal branches along the small bowel.  The abdomen is irrigated.  Attention is then directed to closure.  Fascia is closed using 2 running #1 PDS sutures.  Fascial closure was palpated noted to be complete without any gaps.  All sponge, needle, and instrument counts were reported correct.  The skin is then closed using staples.  The abdomen is washed and dried.  A dressing consisting of a honeycomb was placed over the staples.  Given his mental status preoperatively, we felt it was in his best interest to remain intubated overnight tonight and see how he does clinically.  He was therefore transferred to a bed for transport to the intensive care unit in critical but stable condition.  I was able to update his son over the phone at completion regarding findings and plans moving forward.  All of his questions were answered and he expressed understanding and agreed with the plan.  I have discussed his case with our critical care medicine team as well.

## 2024-09-24 NOTE — Plan of Care (Signed)
  Problem: Consults Goal: RH GENERAL PATIENT EDUCATION Description: See Patient Education module for education specifics. Outcome: Progressing   Problem: RH BOWEL ELIMINATION Goal: RH STG MANAGE BOWEL WITH ASSISTANCE Description: STG Manage Bowel with min Assistance. Outcome: Progressing   Problem: RH BLADDER ELIMINATION Goal: RH STG MANAGE BLADDER WITH ASSISTANCE Description: STG Manage Bladder With min  Assistance Outcome: Progressing   Problem: RH SKIN INTEGRITY Goal: RH STG SKIN FREE OF INFECTION/BREAKDOWN Description: Manage skin free of infection with min assistance Outcome: Progressing   Problem: RH SAFETY Goal: RH STG ADHERE TO SAFETY PRECAUTIONS W/ASSISTANCE/DEVICE Description: STG Adhere to Safety Precautions With min Assistance/Device. Outcome: Progressing   Problem: RH PAIN MANAGEMENT Goal: RH STG PAIN MANAGED AT OR BELOW PT'S PAIN GOAL Description: <4 w/ prns Outcome: Progressing   Problem: RH KNOWLEDGE DEFICIT GENERAL Goal: RH STG INCREASE KNOWLEDGE OF SELF CARE AFTER HOSPITALIZATION Description: Manage increase  knowledge of self care after hospitalization with min assistance from daughter using educational materials provided Outcome: Progressing

## 2024-09-24 NOTE — Anesthesia Procedure Notes (Signed)
 Arterial Line Insertion Start/End11/16/2025 4:07 PM, 09/24/2024 4:10 PM Performed by: Tilford Franky BIRCH, MD, Christopher Comings, CRNA, anesthesiologist  Patient location: Pre-op. Preanesthetic checklist: patient identified, IV checked, site marked, risks and benefits discussed, surgical consent, monitors and equipment checked, pre-op evaluation, timeout performed and anesthesia consent Lidocaine  1% used for infiltration Right, radial was placed Catheter size: 20 G Hand hygiene performed , maximum sterile barriers used  and Seldinger technique used Allen's test indicative of satisfactory collateral circulation Attempts: 1 Following insertion, dressing applied. Post procedure assessment: normal and unchanged  Patient tolerated the procedure well with no immediate complications.

## 2024-09-24 NOTE — Transfer of Care (Signed)
 Immediate Anesthesia Transfer of Care Note  Patient: Noah Burnett  Procedure(s) Performed: LAPAROTOMY, EXPLORATORY; REDUCTION AND REPAIR OF INTERNAL HERNIA (Abdomen)  Patient Location: ICU  Anesthesia Type:General  Level of Consciousness: Patient remains intubated per anesthesia plan  Airway & Oxygen Therapy: Patient remains intubated per anesthesia plan  Post-op Assessment: Report given to RN and Post -op Vital signs reviewed and stable  Post vital signs: Reviewed and stable  Last Vitals:  Vitals Value Taken Time  BP 136/78 09/24/24 17:45  Temp    Pulse 93 09/24/24 17:51  Resp 20 09/24/24 17:53  SpO2 98 % 09/24/24 17:51  Vitals shown include unfiled device data.  Last Pain: There were no vitals filed for this visit.       Complications: There were no known notable events for this encounter.

## 2024-09-24 NOTE — Progress Notes (Signed)
 Pt discharged to OR for surgery on 6L HFNC.

## 2024-09-24 NOTE — Progress Notes (Signed)
 Pt noted with O2 sats of 86% on room air with Respiratory Rate of ~30. Pulse rate fluctuating 36-120. Place on nasal canula. Rapid response and MD made aware. New orders placed and treatment ongoing.    Geralynn LELON Earl, RN

## 2024-09-24 NOTE — Progress Notes (Addendum)
 PROGRESS NOTE   Subjective/Complaints:  Pt sleepy this AM- due to remeron- unfortunately.    Pt's O2 sats 86% this Am and increased resp rate to high 20's/low 30's- abd distended  Pt said just feels uncomfortable  Pt reports more sleepy- hasn't voided per nursing- has required In/out caths- low volumes in spite of continuous TF's not eating/drinking.   LBM last night- medium type 6 BM- incontinent  cathed for 550cc at 5am     09/24/2024    9:43 AM 09/24/2024    8:25 AM 09/24/2024    7:43 AM  Vitals with BMI  Systolic 130 116 96  Diastolic 72 75 62  Pulse 124 121 82      ROS:  Per HPI Pt reports abdomen feels bloated uncomfortable; Some SOB;    + Urinary incontinence + Bowel incontinence  Objective:   DG Abd 1 View Result Date: 09/24/2024 EXAM: 1 VIEW XRAY OF THE ABDOMEN 09/24/2024 08:39:55 AM COMPARISON: KUB dated 09/18/2024. CLINICAL HISTORY: [id number] Bloating [id number] Bloating FINDINGS: LINES, TUBES AND DEVICES: There is a feeding tube within the stomach, which has its tip in the distal stomach. BOWEL: There is diffuse gastric distention of the small and large bowels. SOFT TISSUES: No opaque urinary calculi. BONES: No acute osseous abnormality. IMPRESSION: 1. Diffuse gastric distention of the small and large bowels. No bowel obstruction. Electronically signed by: Evalene Coho MD 09/24/2024 08:49 AM EST RP Workstation: [id number]   DG CHEST PORT 1 VIEW Result Date: 09/24/2024 EXAM: 1 VIEW(S) XRAY OF THE CHEST 09/24/2024 08:39:55 AM COMPARISON: AP radiograph of the chest dated 09/21/2024. CLINICAL HISTORY: Shortness of breath. FINDINGS: LINES, TUBES AND DEVICES: A feeding tube is present with its tip in the distal stomach. LUNGS AND PLEURA: There is mild atelectasis in the left lung base. No pleural effusion. No pneumothorax. HEART AND MEDIASTINUM: No acute abnormality of the cardiac and mediastinal silhouettes.  BONES AND SOFT TISSUES: No acute osseous abnormality. IMPRESSION: 1. Mild atelectasis in the left lung base. Electronically signed by: Evalene Coho MD 09/24/2024 08:48 AM EST RP Workstation: [id number]   Recent Labs    09/22/24 0526  WBC 9.6  HGB 12.5*  HCT 36.3*  PLT 208   Recent Labs    09/23/24 0430 09/24/24 0535  NA 130* 130*  K 3.9 3.9  CL 97* 93*  CO2 23 25  GLUCOSE 128* 167*  BUN 18 20  CREATININE 0.48* 0.56*  CALCIUM 7.1* 7.3*    Intake/Output Summary (Last 24 hours) at 09/24/2024 1008 Last data filed at 09/24/2024 0906 Gross per 24 hour  Intake 1017 ml  Output 850 ml  Net 167 ml        Physical Exam: Vital Signs Blood pressure 130/72, pulse (!) 124, temperature (!) 97.5 F (36.4 C), temperature source Oral, resp. rate (!) 27, height 5' 7 (1.702 m), weight 49.4 kg, SpO2 (!) 87%.      General: sleeping- will awaken with verbal stimuli- to his name; but goes right back to sleep- in some distress- RR elevated, and O2 sats dropped HENT: conjugate gaze; oropharynx moist CV: rate went from 38-120's- sounded like Afib, but EKG just showed some PVC's; no JVD  Pulmonary:decreased throughout GI: pretty tight- very distended- tympanic from air?; mildly TTP but no rebound; slightly hypoactive BS Psychiatric: sleepy, but knows where he is; why here Neurological: sleepy-    Skin: C/D/I. No apparent lesions.  Peripheral IVs intact.   MSK:      No apparent deformity.       Neurologic exam:  Cognition: AAO to person; not to place and time.  Follows all commands appropriately. Language: Moderate to severe dysarthria Memory: Recalls 0/3 objects at 5 minutes.   Insight: Poor insight into current condition.  Mood: Flat affect Sensation: To light touch intact in BL UEs and LEs   Reflexes: 2+ in BL UE and LEs. Negative Hoffman's and babinski signs bilaterally.   CN: 2-12 grossly intact.   Coordination: No apparent tremors.  Mild left upper extremity  ataxia. Spasticity: MAS 0 in all extremities.        Strength:                RUE: 5/5 SA, 5/5 EF, 5/5 EE, 5/5 WE, 5/5 FF, 5/5 FA                LUE:  4/5 SA, 4/5 EF, 4/5 EE, 4/5 WE, 4/5 FF, 4/5 FA                RLE: 5/5 HF, 5/5 KE, 5/5  DF, 5/5  EHL, 5/5  PF                 LLE:  4/5 HF, 4/5 KE, 4/5  DF, 4/5  EHL, 4/5  PF      Assessment/Plan: 1. Functional deficits which require 3+ hours per day of interdisciplinary therapy in a comprehensive inpatient rehab setting. Physiatrist is providing close team supervision and 24 hour management of active medical problems listed below. Physiatrist and rehab team continue to assess barriers to discharge/monitor patient progress toward functional and medical goals  Care Tool:  Bathing    Body parts bathed by patient: Right arm, Left arm, Chest, Abdomen, Right upper leg, Left upper leg, Front perineal area, Buttocks, Face   Body parts bathed by helper: Right lower leg, Left lower leg     Bathing assist Assist Level: Minimal Assistance - Patient > 75%     Upper Body Dressing/Undressing Upper body dressing   What is the patient wearing?: Pull over shirt    Upper body assist Assist Level: Moderate Assistance - Patient 50 - 74%    Lower Body Dressing/Undressing Lower body dressing      What is the patient wearing?: Incontinence brief, Pants     Lower body assist Assist for lower body dressing: Maximal Assistance - Patient 25 - 49%     Toileting Toileting    Toileting assist Assist for toileting: Moderate Assistance - Patient 50 - 74%     Transfers Chair/bed transfer  Transfers assist     Chair/bed transfer assist level: Minimal Assistance - Patient > 75%     Locomotion Ambulation   Ambulation assist      Assist level: Minimal Assistance - Patient > 75% Assistive device: Walker-rolling Max distance: 75   Walk 10 feet activity   Assist     Assist level: Minimal Assistance - Patient > 75% Assistive device:  Walker-rolling   Walk 50 feet activity   Assist    Assist level: Minimal Assistance - Patient > 75% Assistive device: Walker-rolling    Walk 150 feet activity   Assist    Assist  level: Minimal Assistance - Patient > 75% Assistive device: Walker-rolling    Walk 10 feet on uneven surface  activity   Assist     Assist level: Minimal Assistance - Patient > 75% Assistive device: Walker-rolling   Wheelchair     Assist Is the patient using a wheelchair?: Yes (used as transport on unit) Type of Wheelchair: Manual    Wheelchair assist level: Dependent - Patient 0%      Wheelchair 50 feet with 2 turns activity    Assist        Assist Level: Dependent - Patient 0%   Wheelchair 150 feet activity     Assist      Assist Level: Dependent - Patient 0%   Blood pressure 130/72, pulse (!) 124, temperature (!) 97.5 F (36.4 C), temperature source Oral, resp. rate (!) 27, height 5' 7 (1.702 m), weight 49.4 kg, SpO2 (!) 87%.  Medical Problem List and Plan: 1. 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  Antithrombotics: -DVT/anticoagulation:  Pharmaceutical: Lovenox initiated 09/09/2024             -antiplatelet therapy: N/A 3. Pain Management: Tylenol  as needed 4. Mood/Behavior/Sleep: Provide emotional support             -antipsychotic agents: N/A 5. Neuropsych/cognition: This patient is not capable of making decisions on his own behalf. 6. Skin/Wound Care: Routine skin checks 7. Fluids/Electrolytes/Nutrition: Routine in and outs with follow-up chemistries  - add calcium supplement  8.  Chronic hyponatremia/SIADH as well  as hypophosphatemia/hypokalemia..  Continue sodium chloride  tablets 2 g 3 times daily x 7 days as well as the addition of urea  15 mg twice daily x 6 doses and Lasix 20 mg daily.             - Nephrology following closely on inpatient; low threshold to reengage  11-14: Sodium improved to 132 this a.m.; Dr. Dolan reduced salt tabs to 1 g 3 times daily--appreciate nephrology assistance. trend BMP daily.    11/15- Na 130 today- down slightly, but not 123 right now- per renal 9.  ID encephalitis/vasculitis.  ID recommending continue acyclovir x 14 days.  Patient also started on high-dose methylprednisolone by neurology 09/14/2024 with 5-day course completed. 10.  Acute urinary retention as well as history of prostate cancer status post TURP.  Patient with retaining urine 09/13/2024 bladder scan 900 cc.  Order was given for straight cath unsuccessful coud catheter placed.  Initiated Flomax.  Plan voiding trial once on rehab.  - 11-14: Flomax unable to be crushed through tube; start terazosin 1 mg nightly.  Plan for DC Foley trial in AM.  11/15- D/C foley this AM- will order bladder scans q6 hours and then in/out caths with code' and lidocaine  jelly if volumes >350cc  11/16- not voiding- being in/out cathed- bladder scanned- was 86-143-cc- will need IVFs since stopping TFs 11.  AKI.  Resolved.  Follow-up chemistries  12.  GERD/duodenal ulceration, stricture and mass status post robotic gastrojejunostomy  8/25 at [facility].  Continue PPI  11/16- found about still has duodenal mass- hx of 4 biopsies and multiple imaging studies- unclear what it is.   13.  Hypertension.  Norvasc 5 mg daily.  HCTZ at home currently on hold due to soft blood pressures.  Continue to monitor with increased mobility.  - Blood pressure stable  11-14: Starting terazosin for urinary retention as above; monitor for hypotension.  Continue oral Lasix per nephrology. 11/15- BP looks good- con't regimen  11/16- BP low this AM- held  Lasix and Norvasc x1- doing better    09/24/2024    9:43 AM 09/24/2024    8:25 AM 09/24/2024    7:43 AM  Vitals with BMI  Systolic 130 116 96  Diastolic 72 75 62  Pulse 124 121 82     14.  Recent diagnosis of shingles September 2025.  Acyclovir as directed 15.  Hyperlipidemia.  Crestor 16.  Decreased nutritional storage/ileus- per son, hx of duodenal mass-.  Diet advanced to mechanical soft with tube feeds for nutritional support.    - Dietary follow-up; continues to be dependent on tube feeds.  Monitor for p.o. intakes 11-14: Refusing most meals, start Megace 400 mg twice daily for stimulation.  11/15- changed Mallie foods to PO, between meals per Dietitian's note- pt not tolerating well - a lot of bloating and nausea when receives via tube- Also added Remeron 15 mg at bedtime for mood/appetite as well- per family had Megace at home for 3 days prior to admission- poor appetite has been much longer than hospital admission- thinks don't taste right- no thrush.  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 Ordered another CT of abd/pelvis- holding TF's for the moment since getting NGT and full of air. Will give D51/2NS to make sure pt getting some nutrition. 60cc/hour  17. Productive cough on TF.  Will get chest x-ray on unit admission to evaluate.--Low lung volumes, no lesions   - Incentive spirometer, flutter valve 18. Abd distension  11/15- likely due to TF's since not eating much-will add Fiber 1 package/day to tube- and monitor- was full of stools, so makes sense had multiple BM's yesterday- if gets stopped up/constipated again, will reassess.  11/16- Pt's abdomen is very distended, almost taut- Got KUB- full of air- - will hold TFs for now- will place NGT and take out Cortrak- place to intermittent suction- and hold for 30 minutes  with meds administration- getting CT of Abd and pelvis with contrast- d/w IM  19. SOB/needs new O2  11/16- Pt's up to 5L Of O2- sleeping with mouth open- sats dropped to 86% initially and placed on 4L- dropped again and increased to 5L, might need nonrebreather due to mouth breather- ordered Chest CTA to rule out PE- HR running 39-120's- heart rate usually 40's per son- for decades  20. Rapid response  11/16- needed due to BP 96 systolic initially, Sats 86%- required 4-5L to improve sats'- HR started at 38 and up to 120's before rapid response left- not clear if from abd or from possible Resp issues- ordered imaging as per #18/19- and IM consulted- they are coming to see pt.    I spent a total of 1 hour 57   minutes on total care today- >50% coordination of care- due to handle pt's  medical issues today- calling IM- attending rapid response for 45 minutes; and d/w nursing >5 different times- also ordering CT of Chest, Abd/pelvis and review independently of CXR and KUB- also spoke with family 4 different times going over medical issues and updated on care.    Addendum- Got back CT abdomen and pelvis- has ischemic bowel per d/w radiology- at [facility].   Called general surgery- Dr Medford Pizza- will need to go to OR for ischemic bowel- per son has duodenal mass- as I detailed above- not sure if that's adding to this situation. Will d/c pt- with goal to get to OR today.   Spoke to pt's niece as well as son Manus and went over radiology results and need to get to OR today. Son was at lunch and coming back ASAP. Spoke to [employer] nurse- and she will inform Beds to make sure they are aware- will likely go to ICU after surgery.    LOS: 3 days A FACE TO FACE EVALUATION WAS PERFORMED  Hesston Hitchens 09/24/2024, 10:08 AM

## 2024-09-24 NOTE — Consult Note (Signed)
 CC/Reason for consult: Bowel ischemia Requesting physician: Duwaine Barrs, MD   HPI: MELQUISEDEC JOURNEY is an 83 y.o. 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   Today, he has been unresponsive for the most part and quite somnolent. He underwent workup for abdominal distention that has began in the last 24 hours.  Findings concerning for bowel ischemia in the setting of a prior gastrojejunostomy.  Gastric outlet obstruction.  Past Medical History:  Diagnosis Date   Arthritis    Bladder tumor    Deafness, left    Full dentures    GERD (gastroesophageal reflux disease)    History of [place] spotted fever    01-25-2020  per pt had in 2018 and was treated w/ no residual   Hyperplasia of prostate with lower urinary tract symptoms (LUTS)    Hypertension    followed by pcp   (01-25-2020  per pt had stress test approx. 2005, told normal , done in [place] somewhere)   Pre-diabetes    followed by pcp ,   watches diet   Prostate cancer [facility]) urologist--- dr watt   dx by bx01-13-2020,  Gleason 3+4 ;  MRI 12-19-2018, vol 96.26;   active survillance   Shingles    Wears glasses    Wears hearing aid in right ear     Past Surgical History:  Procedure Laterality Date   SHOULDER ARTHROSCOPY WITH ROTATOR CUFF REPAIR AND SUBACROMIAL DECOMPRESSION Right 05-20-2009  @[facility]    TRANSURETHRAL RESECTION OF BLADDER TUMOR WITH MITOMYCIN -C N/A 02/01/2020   Procedure: TRANSURETHRAL RESECTION OF BLADDER TUMOR;  Surgeon: Watt Rush, MD;  Location: [facility];  Service: Urology;  Laterality: N/A;   TRANSURETHRAL RESECTION OF PROSTATE      No family history on file.  Social:  reports that he quit smoking about 25 years ago. His smoking use included cigarettes. He started smoking about 61 years ago. He has a 36 pack-year smoking history.  He has been exposed to tobacco smoke. He has never used smokeless tobacco. He reports that he does not currently use alcohol. He reports that he does not use drugs.  Allergies:  Allergies  Allergen Reactions   Haemophilus Influenzae Vaccines Other (See Comments)    I get deathly sick    Medications: I have reviewed the patient's current medications.  Results for orders placed or performed during the hospital encounter of 09/21/24 (from the past 48 hours)  Glucose, capillary     Status: Abnormal   Collection Time: 09/22/24  9:16 PM  Result Value Ref Range   Glucose-Capillary 137 (H) 70 - 99 mg/dL    Comment: Glucose reference range applies only to samples taken after fasting for at least 8 hours.  Glucose, capillary     Status: Abnormal   Collection Time: 09/23/24 12:56 AM  Result Value Ref Range   Glucose-Capillary 121 (H) 70 - 99 mg/dL    Comment: Glucose reference range applies only to samples taken after fasting for at least 8 hours.  Basic metabolic panel     Status: Abnormal   Collection Time: 09/23/24  4:30 AM  Result Value Ref Range   Sodium 130 (L) 135 - 145 mmol/L   Potassium 3.9 3.5 - 5.1 mmol/L   Chloride 97 (L) 98 - 111 mmol/L   CO2 23 22 - 32 mmol/L   Glucose,  Bld 128 (H) 70 - 99 mg/dL    Comment: Glucose reference range applies only to samples taken after fasting for at least 8 hours.   BUN 18 8 - 23 mg/dL   Creatinine, Ser 9.51 (L) 0.61 - 1.24 mg/dL   Calcium 7.1 (L) 8.9 - 10.3 mg/dL   GFR, Estimated >39 >39 mL/min    Comment: (NOTE) Calculated using the CKD-EPI Creatinine Equation (2021)    Anion gap 10 5 - 15    Comment: Performed at [facility], [address], [place], [place] [address]  Glucose, capillary     Status: Abnormal   Collection Time: 09/23/24  4:56 AM  Result Value Ref Range   Glucose-Capillary 130 (H) 70 - 99 mg/dL    Comment: Glucose reference range applies only to samples taken after fasting for at least 8 hours.  Glucose,  capillary     Status: Abnormal   Collection Time: 09/23/24  9:49 AM  Result Value Ref Range   Glucose-Capillary 156 (H) 70 - 99 mg/dL    Comment: Glucose reference range applies only to samples taken after fasting for at least 8 hours.  Glucose, capillary     Status: Abnormal   Collection Time: 09/23/24 12:02 PM  Result Value Ref Range   Glucose-Capillary 144 (H) 70 - 99 mg/dL    Comment: Glucose reference range applies only to samples taken after fasting for at least 8 hours.  Glucose, capillary     Status: Abnormal   Collection Time: 09/23/24  4:29 PM  Result Value Ref Range   Glucose-Capillary 142 (H) 70 - 99 mg/dL    Comment: Glucose reference range applies only to samples taken after fasting for at least 8 hours.  Glucose, capillary     Status: Abnormal   Collection Time: 09/23/24  9:10 PM  Result Value Ref Range   Glucose-Capillary 156 (H) 70 - 99 mg/dL    Comment: Glucose reference range applies only to samples taken after fasting for at least 8 hours.   Comment 1 Notify RN   Glucose, capillary     Status: Abnormal   Collection Time: 09/24/24 12:18 AM  Result Value Ref Range   Glucose-Capillary 173 (H) 70 - 99 mg/dL    Comment: Glucose reference range applies only to samples taken after fasting for at least 8 hours.   Comment 1 Notify RN   Glucose, capillary     Status: Abnormal   Collection Time: 09/24/24  4:13 AM  Result Value Ref Range   Glucose-Capillary 156 (H) 70 - 99 mg/dL    Comment: Glucose reference range applies only to samples taken after fasting for at least 8 hours.   Comment 1 Notify RN   Basic metabolic panel     Status: Abnormal   Collection Time: 09/24/24  5:35 AM  Result Value Ref Range   Sodium 130 (L) 135 - 145 mmol/L   Potassium 3.9 3.5 - 5.1 mmol/L   Chloride 93 (L) 98 - 111 mmol/L   CO2 25 22 - 32 mmol/L   Glucose, Bld 167 (H) 70 - 99 mg/dL    Comment: Glucose reference range applies only to samples taken after fasting for at least 8 hours.    BUN 20 8 - 23 mg/dL   Creatinine, Ser 9.43 (L) 0.61 - 1.24 mg/dL   Calcium 7.3 (L) 8.9 - 10.3 mg/dL   GFR, Estimated >39 >39 mL/min    Comment: (NOTE) Calculated using the CKD-EPI Creatinine Equation (  2021)    Anion gap 12 5 - 15    Comment: Performed at [facility], [address] [place], [place] [address]  Brain natriuretic peptide     Status: Abnormal   Collection Time: 09/24/24  5:35 AM  Result Value Ref Range   B Natriuretic Peptide 111.6 (H) 0.0 - 100.0 pg/mL    Comment: Performed at [facility], [address] [place], [place] [address]  Glucose, capillary     Status: Abnormal   Collection Time: 09/24/24  8:16 AM  Result Value Ref Range   Glucose-Capillary 176 (H) 70 - 99 mg/dL    Comment: Glucose reference range applies only to samples taken after fasting for at least 8 hours.  Glucose, capillary     Status: Abnormal   Collection Time: 09/24/24  9:01 AM  Result Value Ref Range   Glucose-Capillary 186 (H) 70 - 99 mg/dL    Comment: Glucose reference range applies only to samples taken after fasting for at least 8 hours.    CT ABDOMEN PELVIS W CONTRAST Addendum Date: 09/24/2024 ADDENDUM REPORT: 09/24/2024 14:17 ADDENDUM: Above findings indicate ischemic small bowel with perforation. Site of perforation unclear as may be related to the narrowed jejunal loop with twisting of the mesentery in the midline abdomen just right of midline as described. Critical Value/emergent results were called by telephone at the time of interpretation on 09/24/2024 at 2:15 pm to provider [facility] , who verbally acknowledged these results. Electronically Signed   By: Toribio Agreste M.D.   On: 09/24/2024 14:17   Result Date: 09/24/2024 CLINICAL DATA:  Shortness of breath. Abdominal pain. High probability of pulmonary embolism. EXAM: CT ANGIOGRAPHY CHEST CT ABDOMEN AND PELVIS WITH CONTRAST TECHNIQUE: Multidetector CT imaging of the chest was performed using the standard protocol during  bolus administration of intravenous contrast. Multiplanar CT image reconstructions and MIPs were obtained to evaluate the vascular anatomy. Multidetector CT imaging of the abdomen and pelvis was performed using the standard protocol during bolus administration of intravenous contrast. RADIATION DOSE REDUCTION: This exam was performed according to the departmental dose-optimization program which includes automated exposure control, adjustment of the mA and/or kV according to patient size and/or use of iterative reconstruction technique. CONTRAST:  75mL OMNIPAQUE  IOHEXOL  350 MG/ML SOLN COMPARISON:  CT abdomen/pelvis 06/20/2024 FINDINGS: CTA CHEST FINDINGS Cardiovascular: Stable borderline cardiomegaly. Calcified plaque over the left anterior descending and right coronary arteries. Thoracic aorta is normal in caliber. Mild calcified plaque over the descending thoracic aorta. Pulmonary arterial system is adequately opacified without evidence of emboli. Remaining vascular structures are unremarkable. Mediastinum/Nodes: No mediastinal or hilar adenopathy. Mild-to-moderate dilatation of the esophagus with air-fluid level. Prominent fluid-filled distal esophagus without significant change from the prior exam which may be due to reflux or dysmotility versus hiatal hernia. Lungs/Pleura: Centrilobular emphysematous disease is present. Lungs are adequately inflated. There is mild bilateral posterior airspace density which may be due to atelectasis or infection. Small to moderate bilateral pleural effusions right worse than left. Subtle hazy patchy somewhat nodular airspace opacification over the right upper lobe with minimal opacification over the posterior dependent upper lobes. Findings may be due to infection or inflammatory process and less likely underlying neoplasm. Airways are normal. Musculoskeletal: No focal abnormality. Review of the MIP images confirms the above findings. CT ABDOMEN and PELVIS FINDINGS Hepatobiliary:  Several liver cysts unchanged. Gallbladder and biliary tree are unremarkable. Pancreas: Normal. Spleen: Normal. Adrenals/Urinary Tract: Adrenal glands are normal. Kidneys are normal in  size without hydronephrosis or nephrolithiasis. Bilateral renal cysts unchanged. Ureters are unremarkable. Bladder minimally distended with small focus of air along the nondependent portion likely due to recent instrumentation. Stomach/Bowel: Stomach distended with fluid and air. Dilatation of the third portion of the duodenum measuring 3.2 cm in diameter. There is also dilatation of the proximal jejunum measuring 3.4 cm. The dilated duodenum becomes significantly narrowed as it crosses the midline from left to right just below the level of the aortic bifurcation. Small bowel in this focal area appears to be associated with slight twisting of the mesentery. There are multiple mid to distal small bowel loops which are dilated and demonstrate diffuse pneumatosis. Distal ileal loop measures 3.6 cm in diameter. There is moderate fecal retention over the rectum. Stool and air-filled right colon, transverse colon and splenic flexure. There is evidence of small amount of free peritoneal air best seen over the right peripelvic region anteriorly. Appendix not definitely visualized. Vascular/Lymphatic: Moderate calcified plaque over the abdominal aorta which is normal caliber. Celiac axis and superior mesenteric artery and branches appear to be patent. Inferior mesenteric artery not well visualized. Narrowed but patent left internal iliac artery. Portal vein and superior mesenteric vein are patent. No adenopathy. Reproductive: Moderate prostatic enlargement with moderate impression upon the bladder base unchanged. Other: Moderate free fluid over the pelvis. Musculoskeletal: No focal abnormality. Review of the MIP images confirms the above findings. IMPRESSION: 1. No evidence of pulmonary embolism. 2. Small to moderate bilateral pleural  effusions right worse than left with associated bibasilar posterior airspace density which may be due to atelectasis or infection. Subtle hazy patchy somewhat nodular airspace opacification over the right upper lobe with minimal opacification over the posterior dependent upper lobes. Findings may be due to infection or inflammatory process and much less likely underlying neoplasm. Recommend follow-up CT 4-6 weeks. 3. Evidence of free peritoneal air with multiple mid to distal dilated small bowel loops demonstrating moderate diffuse pneumatosis. Findings are concerning for bowel ischemia. 4. Significant narrowing of the proximal jejunum as it crosses the midline from left to right just below the level of the aortic bifurcation. Small bowel in this focal area appears to be associated with slight twisting of the mesentery. Findings are concerning for small bowel obstruction due to volvulus or closed loop obstruction. 5. Moderate free fluid over the pelvis. 6. Moderate prostatic enlargement with moderate impression upon the bladder base unchanged. 7. Aortic atherosclerosis. Atherosclerotic coronary artery disease. Aortic Atherosclerosis (ICD10-I70.0) and Emphysema (ICD10-J43.9). Currently patient provider. Electronically Signed: By: Toribio Agreste M.D. On: 09/24/2024 14:04   CT Angio Chest Pulmonary Embolism (PE) W or WO Contrast Addendum Date: 09/24/2024 ADDENDUM REPORT: 09/24/2024 14:17 ADDENDUM: Above findings indicate ischemic small bowel with perforation. Site of perforation unclear as may be related to the narrowed jejunal loop with twisting of the mesentery in the midline abdomen just right of midline as described. Critical Value/emergent results were called by telephone at the time of interpretation on 09/24/2024 at 2:15 pm to provider [facility] , who verbally acknowledged these results. Electronically Signed   By: Toribio Agreste M.D.   On: 09/24/2024 14:17   Result Date: 09/24/2024 CLINICAL DATA:   Shortness of breath. Abdominal pain. High probability of pulmonary embolism. EXAM: CT ANGIOGRAPHY CHEST CT ABDOMEN AND PELVIS WITH CONTRAST TECHNIQUE: Multidetector CT imaging of the chest was performed using the standard protocol during bolus administration of intravenous contrast. Multiplanar CT image reconstructions and MIPs were obtained to evaluate the vascular anatomy. Multidetector CT  imaging of the abdomen and pelvis was performed using the standard protocol during bolus administration of intravenous contrast. RADIATION DOSE REDUCTION: This exam was performed according to the departmental dose-optimization program which includes automated exposure control, adjustment of the mA and/or kV according to patient size and/or use of iterative reconstruction technique. CONTRAST:  75mL OMNIPAQUE  IOHEXOL  350 MG/ML SOLN COMPARISON:  CT abdomen/pelvis 06/20/2024 FINDINGS: CTA CHEST FINDINGS Cardiovascular: Stable borderline cardiomegaly. Calcified plaque over the left anterior descending and right coronary arteries. Thoracic aorta is normal in caliber. Mild calcified plaque over the descending thoracic aorta. Pulmonary arterial system is adequately opacified without evidence of emboli. Remaining vascular structures are unremarkable. Mediastinum/Nodes: No mediastinal or hilar adenopathy. Mild-to-moderate dilatation of the esophagus with air-fluid level. Prominent fluid-filled distal esophagus without significant change from the prior exam which may be due to reflux or dysmotility versus hiatal hernia. Lungs/Pleura: Centrilobular emphysematous disease is present. Lungs are adequately inflated. There is mild bilateral posterior airspace density which may be due to atelectasis or infection. Small to moderate bilateral pleural effusions right worse than left. Subtle hazy patchy somewhat nodular airspace opacification over the right upper lobe with minimal opacification over the posterior dependent upper lobes. Findings may  be due to infection or inflammatory process and less likely underlying neoplasm. Airways are normal. Musculoskeletal: No focal abnormality. Review of the MIP images confirms the above findings. CT ABDOMEN and PELVIS FINDINGS Hepatobiliary: Several liver cysts unchanged. Gallbladder and biliary tree are unremarkable. Pancreas: Normal. Spleen: Normal. Adrenals/Urinary Tract: Adrenal glands are normal. Kidneys are normal in size without hydronephrosis or nephrolithiasis. Bilateral renal cysts unchanged. Ureters are unremarkable. Bladder minimally distended with small focus of air along the nondependent portion likely due to recent instrumentation. Stomach/Bowel: Stomach distended with fluid and air. Dilatation of the third portion of the duodenum measuring 3.2 cm in diameter. There is also dilatation of the proximal jejunum measuring 3.4 cm. The dilated duodenum becomes significantly narrowed as it crosses the midline from left to right just below the level of the aortic bifurcation. Small bowel in this focal area appears to be associated with slight twisting of the mesentery. There are multiple mid to distal small bowel loops which are dilated and demonstrate diffuse pneumatosis. Distal ileal loop measures 3.6 cm in diameter. There is moderate fecal retention over the rectum. Stool and air-filled right colon, transverse colon and splenic flexure. There is evidence of small amount of free peritoneal air best seen over the right peripelvic region anteriorly. Appendix not definitely visualized. Vascular/Lymphatic: Moderate calcified plaque over the abdominal aorta which is normal caliber. Celiac axis and superior mesenteric artery and branches appear to be patent. Inferior mesenteric artery not well visualized. Narrowed but patent left internal iliac artery. Portal vein and superior mesenteric vein are patent. No adenopathy. Reproductive: Moderate prostatic enlargement with moderate impression upon the bladder base  unchanged. Other: Moderate free fluid over the pelvis. Musculoskeletal: No focal abnormality. Review of the MIP images confirms the above findings. IMPRESSION: 1. No evidence of pulmonary embolism. 2. Small to moderate bilateral pleural effusions right worse than left with associated bibasilar posterior airspace density which may be due to atelectasis or infection. Subtle hazy patchy somewhat nodular airspace opacification over the right upper lobe with minimal opacification over the posterior dependent upper lobes. Findings may be due to infection or inflammatory process and much less likely underlying neoplasm. Recommend follow-up CT 4-6 weeks. 3. Evidence of free peritoneal air with multiple mid to distal dilated small bowel loops demonstrating moderate diffuse  pneumatosis. Findings are concerning for bowel ischemia. 4. Significant narrowing of the proximal jejunum as it crosses the midline from left to right just below the level of the aortic bifurcation. Small bowel in this focal area appears to be associated with slight twisting of the mesentery. Findings are concerning for small bowel obstruction due to volvulus or closed loop obstruction. 5. Moderate free fluid over the pelvis. 6. Moderate prostatic enlargement with moderate impression upon the bladder base unchanged. 7. Aortic atherosclerosis. Atherosclerotic coronary artery disease. Aortic Atherosclerosis (ICD10-I70.0) and Emphysema (ICD10-J43.9). Currently patient provider. Electronically Signed: By: Toribio Agreste M.D. On: 09/24/2024 14:04   DG Abd 1 View Result Date: 09/24/2024 EXAM: 1 VIEW XRAY OF THE ABDOMEN 09/24/2024 08:39:55 AM COMPARISON: KUB dated 09/18/2024. CLINICAL HISTORY: [id number] Bloating [id number] Bloating FINDINGS: LINES, TUBES AND DEVICES: There is a feeding tube within the stomach, which has its tip in the distal stomach. BOWEL: There is diffuse gastric distention of the small and large bowels. SOFT TISSUES: No opaque urinary calculi.  BONES: No acute osseous abnormality. IMPRESSION: 1. Diffuse gastric distention of the small and large bowels. No bowel obstruction. Electronically signed by: Evalene Coho MD 09/24/2024 08:49 AM EST RP Workstation: [id number]   DG CHEST PORT 1 VIEW Result Date: 09/24/2024 EXAM: 1 VIEW(S) XRAY OF THE CHEST 09/24/2024 08:39:55 AM COMPARISON: AP radiograph of the chest dated 09/21/2024. CLINICAL HISTORY: Shortness of breath. FINDINGS: LINES, TUBES AND DEVICES: A feeding tube is present with its tip in the distal stomach. LUNGS AND PLEURA: There is mild atelectasis in the left lung base. No pleural effusion. No pneumothorax. HEART AND MEDIASTINUM: No acute abnormality of the cardiac and mediastinal silhouettes. BONES AND SOFT TISSUES: No acute osseous abnormality. IMPRESSION: 1. Mild atelectasis in the left lung base. Electronically signed by: Evalene Coho MD 09/24/2024 08:48 AM EST RP Workstation: [id number]    ROS -unable to obtain 2/2 condition of patient  PE There were no vitals taken for this visit. Constitutional: Somnolent, barely arouses to voice or stimulation; groans Eyes: Moist conjunctiva Lungs: Normal respiratory effort CV: RRR GI: Abd significantly distended, diffusely tender.    Results for orders placed or performed during the hospital encounter of 09/21/24 (from the past 48 hours)  Glucose, capillary     Status: Abnormal   Collection Time: 09/22/24  9:16 PM  Result Value Ref Range   Glucose-Capillary 137 (H) 70 - 99 mg/dL    Comment: Glucose reference range applies only to samples taken after fasting for at least 8 hours.  Glucose, capillary     Status: Abnormal   Collection Time: 09/23/24 12:56 AM  Result Value Ref Range   Glucose-Capillary 121 (H) 70 - 99 mg/dL    Comment: Glucose reference range applies only to samples taken after fasting for at least 8 hours.  Basic metabolic panel     Status: Abnormal   Collection Time: 09/23/24  4:30 AM  Result Value Ref Range    Sodium 130 (L) 135 - 145 mmol/L   Potassium 3.9 3.5 - 5.1 mmol/L   Chloride 97 (L) 98 - 111 mmol/L   CO2 23 22 - 32 mmol/L   Glucose, Bld 128 (H) 70 - 99 mg/dL    Comment: Glucose reference range applies only to samples taken after fasting for at least 8 hours.   BUN 18 8 - 23 mg/dL   Creatinine, Ser 9.51 (L) 0.61 - 1.24 mg/dL   Calcium 7.1 (L) 8.9 - 10.3 mg/dL   GFR, Estimated >39 >39  mL/min    Comment: (NOTE) Calculated using the CKD-EPI Creatinine Equation (2021)    Anion gap 10 5 - 15    Comment: Performed at [facility], [address] [place], [place] [address]  Glucose, capillary     Status: Abnormal   Collection Time: 09/23/24  4:56 AM  Result Value Ref Range   Glucose-Capillary 130 (H) 70 - 99 mg/dL    Comment: Glucose reference range applies only to samples taken after fasting for at least 8 hours.  Glucose, capillary     Status: Abnormal   Collection Time: 09/23/24  9:49 AM  Result Value Ref Range   Glucose-Capillary 156 (H) 70 - 99 mg/dL    Comment: Glucose reference range applies only to samples taken after fasting for at least 8 hours.  Glucose, capillary     Status: Abnormal   Collection Time: 09/23/24 12:02 PM  Result Value Ref Range   Glucose-Capillary 144 (H) 70 - 99 mg/dL    Comment: Glucose reference range applies only to samples taken after fasting for at least 8 hours.  Glucose, capillary     Status: Abnormal   Collection Time: 09/23/24  4:29 PM  Result Value Ref Range   Glucose-Capillary 142 (H) 70 - 99 mg/dL    Comment: Glucose reference range applies only to samples taken after fasting for at least 8 hours.  Glucose, capillary     Status: Abnormal   Collection Time: 09/23/24  9:10 PM  Result Value Ref Range   Glucose-Capillary 156 (H) 70 - 99 mg/dL    Comment: Glucose reference range applies only to samples taken after fasting for at least 8 hours.   Comment 1 Notify RN   Glucose, capillary     Status: Abnormal   Collection Time: 09/24/24  12:18 AM  Result Value Ref Range   Glucose-Capillary 173 (H) 70 - 99 mg/dL    Comment: Glucose reference range applies only to samples taken after fasting for at least 8 hours.   Comment 1 Notify RN   Glucose, capillary     Status: Abnormal   Collection Time: 09/24/24  4:13 AM  Result Value Ref Range   Glucose-Capillary 156 (H) 70 - 99 mg/dL    Comment: Glucose reference range applies only to samples taken after fasting for at least 8 hours.   Comment 1 Notify RN   Basic metabolic panel     Status: Abnormal   Collection Time: 09/24/24  5:35 AM  Result Value Ref Range   Sodium 130 (L) 135 - 145 mmol/L   Potassium 3.9 3.5 - 5.1 mmol/L   Chloride 93 (L) 98 - 111 mmol/L   CO2 25 22 - 32 mmol/L   Glucose, Bld 167 (H) 70 - 99 mg/dL    Comment: Glucose reference range applies only to samples taken after fasting for at least 8 hours.   BUN 20 8 - 23 mg/dL   Creatinine, Ser 9.43 (L) 0.61 - 1.24 mg/dL   Calcium 7.3 (L) 8.9 - 10.3 mg/dL   GFR, Estimated >39 >39 mL/min    Comment: (NOTE) Calculated using the CKD-EPI Creatinine Equation (2021)    Anion gap 12 5 - 15    Comment: Performed at [facility], [address], [place], [place] [address]  Brain natriuretic peptide     Status: Abnormal   Collection Time: 09/24/24  5:35 AM  Result Value Ref Range   B Natriuretic Peptide 111.6 (H) 0.0 - 100.0  pg/mL    Comment: Performed at [facility], [address] [place], [place] [address]  Glucose, capillary     Status: Abnormal   Collection Time: 09/24/24  8:16 AM  Result Value Ref Range   Glucose-Capillary 176 (H) 70 - 99 mg/dL    Comment: Glucose reference range applies only to samples taken after fasting for at least 8 hours.  Glucose, capillary     Status: Abnormal   Collection Time: 09/24/24  9:01 AM  Result Value Ref Range   Glucose-Capillary 186 (H) 70 - 99 mg/dL    Comment: Glucose reference range applies only to samples taken after fasting for at least 8 hours.     CT ABDOMEN PELVIS W CONTRAST Addendum Date: 09/24/2024 ADDENDUM REPORT: 09/24/2024 14:17 ADDENDUM: Above findings indicate ischemic small bowel with perforation. Site of perforation unclear as may be related to the narrowed jejunal loop with twisting of the mesentery in the midline abdomen just right of midline as described. Critical Value/emergent results were called by telephone at the time of interpretation on 09/24/2024 at 2:15 pm to provider [facility] , who verbally acknowledged these results. Electronically Signed   By: Toribio Agreste M.D.   On: 09/24/2024 14:17   Result Date: 09/24/2024 CLINICAL DATA:  Shortness of breath. Abdominal pain. High probability of pulmonary embolism. EXAM: CT ANGIOGRAPHY CHEST CT ABDOMEN AND PELVIS WITH CONTRAST TECHNIQUE: Multidetector CT imaging of the chest was performed using the standard protocol during bolus administration of intravenous contrast. Multiplanar CT image reconstructions and MIPs were obtained to evaluate the vascular anatomy. Multidetector CT imaging of the abdomen and pelvis was performed using the standard protocol during bolus administration of intravenous contrast. RADIATION DOSE REDUCTION: This exam was performed according to the departmental dose-optimization program which includes automated exposure control, adjustment of the mA and/or kV according to patient size and/or use of iterative reconstruction technique. CONTRAST:  75mL OMNIPAQUE  IOHEXOL  350 MG/ML SOLN COMPARISON:  CT abdomen/pelvis 06/20/2024 FINDINGS: CTA CHEST FINDINGS Cardiovascular: Stable borderline cardiomegaly. Calcified plaque over the left anterior descending and right coronary arteries. Thoracic aorta is normal in caliber. Mild calcified plaque over the descending thoracic aorta. Pulmonary arterial system is adequately opacified without evidence of emboli. Remaining vascular structures are unremarkable. Mediastinum/Nodes: No mediastinal or hilar adenopathy.  Mild-to-moderate dilatation of the esophagus with air-fluid level. Prominent fluid-filled distal esophagus without significant change from the prior exam which may be due to reflux or dysmotility versus hiatal hernia. Lungs/Pleura: Centrilobular emphysematous disease is present. Lungs are adequately inflated. There is mild bilateral posterior airspace density which may be due to atelectasis or infection. Small to moderate bilateral pleural effusions right worse than left. Subtle hazy patchy somewhat nodular airspace opacification over the right upper lobe with minimal opacification over the posterior dependent upper lobes. Findings may be due to infection or inflammatory process and less likely underlying neoplasm. Airways are normal. Musculoskeletal: No focal abnormality. Review of the MIP images confirms the above findings. CT ABDOMEN and PELVIS FINDINGS Hepatobiliary: Several liver cysts unchanged. Gallbladder and biliary tree are unremarkable. Pancreas: Normal. Spleen: Normal. Adrenals/Urinary Tract: Adrenal glands are normal. Kidneys are normal in size without hydronephrosis or nephrolithiasis. Bilateral renal cysts unchanged. Ureters are unremarkable. Bladder minimally distended with small focus of air along the nondependent portion likely due to recent instrumentation. Stomach/Bowel: Stomach distended with fluid and air. Dilatation of the third portion of the duodenum measuring 3.2 cm in diameter. There is also dilatation of the proximal jejunum measuring 3.4 cm.  The dilated duodenum becomes significantly narrowed as it crosses the midline from left to right just below the level of the aortic bifurcation. Small bowel in this focal area appears to be associated with slight twisting of the mesentery. There are multiple mid to distal small bowel loops which are dilated and demonstrate diffuse pneumatosis. Distal ileal loop measures 3.6 cm in diameter. There is moderate fecal retention over the rectum. Stool and  air-filled right colon, transverse colon and splenic flexure. There is evidence of small amount of free peritoneal air best seen over the right peripelvic region anteriorly. Appendix not definitely visualized. Vascular/Lymphatic: Moderate calcified plaque over the abdominal aorta which is normal caliber. Celiac axis and superior mesenteric artery and branches appear to be patent. Inferior mesenteric artery not well visualized. Narrowed but patent left internal iliac artery. Portal vein and superior mesenteric vein are patent. No adenopathy. Reproductive: Moderate prostatic enlargement with moderate impression upon the bladder base unchanged. Other: Moderate free fluid over the pelvis. Musculoskeletal: No focal abnormality. Review of the MIP images confirms the above findings. IMPRESSION: 1. No evidence of pulmonary embolism. 2. Small to moderate bilateral pleural effusions right worse than left with associated bibasilar posterior airspace density which may be due to atelectasis or infection. Subtle hazy patchy somewhat nodular airspace opacification over the right upper lobe with minimal opacification over the posterior dependent upper lobes. Findings may be due to infection or inflammatory process and much less likely underlying neoplasm. Recommend follow-up CT 4-6 weeks. 3. Evidence of free peritoneal air with multiple mid to distal dilated small bowel loops demonstrating moderate diffuse pneumatosis. Findings are concerning for bowel ischemia. 4. Significant narrowing of the proximal jejunum as it crosses the midline from left to right just below the level of the aortic bifurcation. Small bowel in this focal area appears to be associated with slight twisting of the mesentery. Findings are concerning for small bowel obstruction due to volvulus or closed loop obstruction. 5. Moderate free fluid over the pelvis. 6. Moderate prostatic enlargement with moderate impression upon the bladder base unchanged. 7. Aortic  atherosclerosis. Atherosclerotic coronary artery disease. Aortic Atherosclerosis (ICD10-I70.0) and Emphysema (ICD10-J43.9). Currently patient provider. Electronically Signed: By: Toribio Agreste M.D. On: 09/24/2024 14:04   CT Angio Chest Pulmonary Embolism (PE) W or WO Contrast Addendum Date: 09/24/2024 ADDENDUM REPORT: 09/24/2024 14:17 ADDENDUM: Above findings indicate ischemic small bowel with perforation. Site of perforation unclear as may be related to the narrowed jejunal loop with twisting of the mesentery in the midline abdomen just right of midline as described. Critical Value/emergent results were called by telephone at the time of interpretation on 09/24/2024 at 2:15 pm to provider [facility] , who verbally acknowledged these results. Electronically Signed   By: Toribio Agreste M.D.   On: 09/24/2024 14:17   Result Date: 09/24/2024 CLINICAL DATA:  Shortness of breath. Abdominal pain. High probability of pulmonary embolism. EXAM: CT ANGIOGRAPHY CHEST CT ABDOMEN AND PELVIS WITH CONTRAST TECHNIQUE: Multidetector CT imaging of the chest was performed using the standard protocol during bolus administration of intravenous contrast. Multiplanar CT image reconstructions and MIPs were obtained to evaluate the vascular anatomy. Multidetector CT imaging of the abdomen and pelvis was performed using the standard protocol during bolus administration of intravenous contrast. RADIATION DOSE REDUCTION: This exam was performed according to the departmental dose-optimization program which includes automated exposure control, adjustment of the mA and/or kV according to patient size and/or use of iterative reconstruction technique. CONTRAST:  75mL OMNIPAQUE  IOHEXOL  350 MG/ML SOLN  COMPARISON:  CT abdomen/pelvis 06/20/2024 FINDINGS: CTA CHEST FINDINGS Cardiovascular: Stable borderline cardiomegaly. Calcified plaque over the left anterior descending and right coronary arteries. Thoracic aorta is normal in caliber. Mild  calcified plaque over the descending thoracic aorta. Pulmonary arterial system is adequately opacified without evidence of emboli. Remaining vascular structures are unremarkable. Mediastinum/Nodes: No mediastinal or hilar adenopathy. Mild-to-moderate dilatation of the esophagus with air-fluid level. Prominent fluid-filled distal esophagus without significant change from the prior exam which may be due to reflux or dysmotility versus hiatal hernia. Lungs/Pleura: Centrilobular emphysematous disease is present. Lungs are adequately inflated. There is mild bilateral posterior airspace density which may be due to atelectasis or infection. Small to moderate bilateral pleural effusions right worse than left. Subtle hazy patchy somewhat nodular airspace opacification over the right upper lobe with minimal opacification over the posterior dependent upper lobes. Findings may be due to infection or inflammatory process and less likely underlying neoplasm. Airways are normal. Musculoskeletal: No focal abnormality. Review of the MIP images confirms the above findings. CT ABDOMEN and PELVIS FINDINGS Hepatobiliary: Several liver cysts unchanged. Gallbladder and biliary tree are unremarkable. Pancreas: Normal. Spleen: Normal. Adrenals/Urinary Tract: Adrenal glands are normal. Kidneys are normal in size without hydronephrosis or nephrolithiasis. Bilateral renal cysts unchanged. Ureters are unremarkable. Bladder minimally distended with small focus of air along the nondependent portion likely due to recent instrumentation. Stomach/Bowel: Stomach distended with fluid and air. Dilatation of the third portion of the duodenum measuring 3.2 cm in diameter. There is also dilatation of the proximal jejunum measuring 3.4 cm. The dilated duodenum becomes significantly narrowed as it crosses the midline from left to right just below the level of the aortic bifurcation. Small bowel in this focal area appears to be associated with slight  twisting of the mesentery. There are multiple mid to distal small bowel loops which are dilated and demonstrate diffuse pneumatosis. Distal ileal loop measures 3.6 cm in diameter. There is moderate fecal retention over the rectum. Stool and air-filled right colon, transverse colon and splenic flexure. There is evidence of small amount of free peritoneal air best seen over the right peripelvic region anteriorly. Appendix not definitely visualized. Vascular/Lymphatic: Moderate calcified plaque over the abdominal aorta which is normal caliber. Celiac axis and superior mesenteric artery and branches appear to be patent. Inferior mesenteric artery not well visualized. Narrowed but patent left internal iliac artery. Portal vein and superior mesenteric vein are patent. No adenopathy. Reproductive: Moderate prostatic enlargement with moderate impression upon the bladder base unchanged. Other: Moderate free fluid over the pelvis. Musculoskeletal: No focal abnormality. Review of the MIP images confirms the above findings. IMPRESSION: 1. No evidence of pulmonary embolism. 2. Small to moderate bilateral pleural effusions right worse than left with associated bibasilar posterior airspace density which may be due to atelectasis or infection. Subtle hazy patchy somewhat nodular airspace opacification over the right upper lobe with minimal opacification over the posterior dependent upper lobes. Findings may be due to infection or inflammatory process and much less likely underlying neoplasm. Recommend follow-up CT 4-6 weeks. 3. Evidence of free peritoneal air with multiple mid to distal dilated small bowel loops demonstrating moderate diffuse pneumatosis. Findings are concerning for bowel ischemia. 4. Significant narrowing of the proximal jejunum as it crosses the midline from left to right just below the level of the aortic bifurcation. Small bowel in this focal area appears to be associated with slight twisting of the mesentery.  Findings are concerning for small bowel obstruction due to volvulus or closed  loop obstruction. 5. Moderate free fluid over the pelvis. 6. Moderate prostatic enlargement with moderate impression upon the bladder base unchanged. 7. Aortic atherosclerosis. Atherosclerotic coronary artery disease. Aortic Atherosclerosis (ICD10-I70.0) and Emphysema (ICD10-J43.9). Currently patient provider. Electronically Signed: By: Toribio Agreste M.D. On: 09/24/2024 14:04   DG Abd 1 View Result Date: 09/24/2024 EXAM: 1 VIEW XRAY OF THE ABDOMEN 09/24/2024 08:39:55 AM COMPARISON: KUB dated 09/18/2024. CLINICAL HISTORY: [id number] Bloating [id number] Bloating FINDINGS: LINES, TUBES AND DEVICES: There is a feeding tube within the stomach, which has its tip in the distal stomach. BOWEL: There is diffuse gastric distention of the small and large bowels. SOFT TISSUES: No opaque urinary calculi. BONES: No acute osseous abnormality. IMPRESSION: 1. Diffuse gastric distention of the small and large bowels. No bowel obstruction. Electronically signed by: Evalene Coho MD 09/24/2024 08:49 AM EST RP Workstation: [id number]   DG CHEST PORT 1 VIEW Result Date: 09/24/2024 EXAM: 1 VIEW(S) XRAY OF THE CHEST 09/24/2024 08:39:55 AM COMPARISON: AP radiograph of the chest dated 09/21/2024. CLINICAL HISTORY: Shortness of breath. FINDINGS: LINES, TUBES AND DEVICES: A feeding tube is present with its tip in the distal stomach. LUNGS AND PLEURA: There is mild atelectasis in the left lung base. No pleural effusion. No pneumothorax. HEART AND MEDIASTINUM: No acute abnormality of the cardiac and mediastinal silhouettes. BONES AND SOFT TISSUES: No acute osseous abnormality. IMPRESSION: 1. Mild atelectasis in the left lung base. Electronically signed by: Evalene Coho MD 09/24/2024 08:48 AM EST RP Workstation: [id number]    A/P: POPE BRUNTY is an 83 y.o. 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  By report, no clear evidence was ever found for a malignancy.  -The anatomy and physiology of the GI tract was discussed with his son and niece. The pathophysiology of internal hernia and ischemic bowel was discussed as well.  We discussed that without surgery this would certainly be fatal.  We discussed that with surgery, there is still a very high potential for mortality. -We have discussed surgery, emergent exploratory laparotomy, possible bowel resection based on intraoperative findings, possible application of temporary abdominal closure device. -The planned procedure, material risks (including, but not limited to, pain, bleeding, infection, scarring, need for blood transfusion, damage to surrounding structures- blood vessels/nerves/viscus/organs, anastomotic leaks, need for additional procedures, blood clot, pulmonary embolus, scenarios where a stoma may be necessary and where it may be permanent, worsening of pre-existing medical conditions, chronic diarrhea, constipation secondary to narcotic use, hernia, recurrence, pneumonia, heart attack, stroke, death) benefits and alternatives to surgery were discussed at length. The patient's son's questions were answered to his satisfaction, he voiced understanding and has elected to proceed with surgery.  Phone consent was obtained from his son-Philip Grossi-whom states that he makes all the medical decisions for his father when he is unable to do so himself.  -We discussed that based on intraoperative findings, the abdomen may be left open.  We also discussed the potential for perioperative morbidity/mortality in this situation given how  acutely ill he currently is.  We discussed that he would almost certainly remain intubated following surgery.  We discussed possibility of multiple subsequent procedures or even findings that may  indicate futility.  He has expressed clear understanding of all this.    I spent a total of 80 minutes in both face-to-face and non-face-to-face activities, excluding procedures performed, for this visit on the date of this encounter.  Lonni Pizza, MD [facility]

## 2024-09-24 NOTE — Progress Notes (Signed)
 MEWS Progress Note  Patient Details Name: Noah Burnett MRN: [id number] DOB: 1941/10/25 Today's Date: 09/24/2024   MEWS Flowsheet Documentation:  Assess: MEWS Score Temp: (!) 97.5 F (36.4 C) BP: 130/72 MAP (mmHg): 88 Pulse Rate: (!) 124 Resp: (!) 27 Level of Consciousness: Alert SpO2: (!) 87 % O2 Device: Nasal Cannula Patient Activity (if Appropriate): In bed O2 Flow Rate (L/min): 5 L/min Assess: MEWS Score MEWS Temp: 0 MEWS Systolic: 0 MEWS Pulse: 2 MEWS RR: 2 MEWS LOC: 0 MEWS Score: 4 MEWS Score Color: Red Assess: SIRS CRITERIA SIRS Temperature : 0 SIRS Respirations : 1 SIRS Pulse: 1 SIRS WBC: 0 SIRS Score Sum : 2 SIRS Temperature : 0 SIRS Pulse: 0 SIRS Respirations : 1 SIRS WBC: 0 SIRS Score Sum : 1 Provider Notification Provider Name/Title: Duwaine Barrs, MD Date Provider Notified: 09/24/24 Time Provider Notified: 0945 Method of Notification: Face-to-face, Call Notification Reason: Change in status, New onset of dysrhythmia Type of New Onset of Dysrhythmia: Sinus tachycardia Symptoms of New Onset of Dysrhythmia: Shortness of breath Provider response: At bedside, See new orders Date of Provider Response: 09/24/24 Time of Provider Response: 0945      Ulla JAYSON Elder 09/24/2024, 10:22 AM

## 2024-09-24 NOTE — Progress Notes (Signed)
 Pharmacy Antibiotic Note  Noah Burnett is a 83 y.o. male admitted on 09/24/2024 with possible ischemic bowel with perforation now s/p ex lap with reduction/repair of internal hernia.  Pharmacy has been consulted for Zosyn  dosing.  Received Zosyn  dose intra-op @1645 .  Plan: Zosyn  3.375g IV q8h (4 hour infusion). Monitor clinical status, duration  Height: 5' 7.01 (170.2 cm) IBW/kg (Calculated) : 66.12  Temp (24hrs), Avg:98.1 F (36.7 C), Min:97.5 F (36.4 C), Max:98.8 F (37.1 C)  Recent Labs  Lab 09/21/24 0501 09/21/24 1955 09/22/24 0526 09/23/24 0430 09/24/24 0535  WBC  --   --  9.6  --   --   CREATININE 0.54* 0.57* 0.53* 0.48* 0.56*    Estimated Creatinine Clearance: 50.6 mL/min (A) (by C-G formula based on SCr of 0.56 mg/dL (L)).    Allergies  Allergen Reactions   Haemophilus Influenzae Vaccines Other (See Comments)    I get deathly sick    Antimicrobials this admission: Zosyn  121/16 > c  Microbiology results: 11/16 MRSA: sent  Thank you for allowing pharmacy to be a part of this patient's care.  Noah Burnett 09/24/2024 6:21 PM

## 2024-09-24 NOTE — Consult Note (Addendum)
 Initial Consultation Note   Patient: Noah Burnett FMW:[id number] DOB: [birth year] PCP: [name], MD DOA: 09/21/2024 DOS: the patient was seen and examined on 09/24/2024 Primary service: Emeline Joesph BROCKS, DO  Referring physician: Duwaine Barrs, MD Reason for consult: Abdominal distention respiratory distress  Assessment/Plan:Noah Burnett is a 83 y.o. male with past medical history of  history of prostate cancer, tobacco abuse, hypertension, GERD, and a duodenal mass involving the pancreatic head for which he underwent a robotic gastrojejunostomy on 07/03/2024.  He has had progressive decline since that time and recent hospitalization with concern for shingles with encephalitis.  Admitted to inpatient rehab due to progressive decline.  Found to have acute respiratory hypoxia with abdominal distention for which we were advised to evaluate.  Assessment and Plan:  Failure to thrive History of nontraumatic subarachnoid hemorrhage History of viral encephalitis  Acute respiratory distress with hypoxia O2 saturation noted to drop down as low as 86% on room air increased respiratory rate into the 20s.  Chest x-ray noted mild atelectasis in the left lung base.  On physical exam patient noted to be tachypneic with decreased overall aeration but no significant rhonchi or wheezes appreciated.  O2 saturation currently maintained on 4 to 5 L of nasal cannula oxygen.  Question the possibility of functional aspect of hypoxia due to intra-abdominal process. - Continue nasal cannula oxygen to maintain O2 saturations - Recommended CT angiogram of the chest sure no signs of pulmonary embolism  Abdominal distention History of gastric mass with gastric outlet obstruction s/p gastrojejunostomy Patient presented with acute worsening abdominal distention this morning.  Had been receiving tube feeds via core track.  Prior history of gastric bypass.  On physical exam patient is abdomen is significantly distended  with bowel sounds decreased.  Initial x-ray of the abdomen and pelvis noted gaseous distention of the bowels.  Question possibility of gastric ischemia versus possible bowel perforation. - Recommended placement of NG tube - Recommended stat CT scan of the abdomen and pelvis with contrast.    TRH will continue to follow the patient.  HPI: Noah Burnett is a 83 y.o. male with past medical history of  history of prostate cancer, tobacco abuse, hypertension, GERD, and a duodenal mass involving the pancreatic head for which he underwent a robotic gastrojejunostomy on 07/03/2024. His postoperative course was complicated by delayed bowel function, malnutrition, progressive weight loss, declining mobility, and increasing need for assistance with activities of daily living. He also developed shingles in late September 2025. By late October, he experienced worsening weakness, functional decline, and an unwitnessed fall, prompting admission on 09/08/2024, where imaging revealed multifocal cortical and subcortical abnormalities concerning for infectious or inflammatory encephalitis versus vasculitis. 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 He improved clinically with acyclovir and high-dose corticosteroids, though sodium fluctuations required nephrology management with salt tablets, urea , and diuretics. His diet progressed from tube feeds to mechanical soft, and palliative care was involved for goals-of-care discussions. Due to persistent functional deficits, weakness, and need for mobility support, therapy services recommended admission to a comprehensive inpatient rehabilitation  program.  TRH consulted to eval due to abdominal distention.  He is experiencing significant gastric distention, noted this morning. He has not been eating and is only receiving tube feeds through  the CoreTrack.  He is also experiencing respiratory distress, described as 'gasping for air' today, which is a change from yesterday. His oxygen saturation was noted to be in the eighties which he had to be placed on nasal cannula oxygen but does not normally use this.  Since the August it seems that his condition has been deteriorating.  No shortness of breath, but his son reports he is grunting and in pain and it seems that his stomach is bothering him.  Review of Systems: As mentioned in the history of present illness. All other systems reviewed and are negative. Past Medical History:  Diagnosis Date   Arthritis    Bladder tumor    Deafness, left    Full dentures    GERD (gastroesophageal reflux disease)    History of [place] spotted fever    01-25-2020  per pt had in 2018 and was treated w/ no residual   Hyperplasia of prostate with lower urinary tract symptoms (LUTS)    Hypertension    followed by pcp   (01-25-2020  per pt had stress test approx. 2005, told normal , done in [place] somewhere)   Pre-diabetes    followed by pcp ,   watches diet   Prostate cancer [facility]) urologist--- dr watt   dx by bx01-13-2020,  Gleason 3+4 ;  MRI 12-19-2018, vol 96.26;   active survillance   Shingles    Wears glasses    Wears hearing aid in right ear    Past Surgical History:  Procedure Laterality Date   SHOULDER ARTHROSCOPY WITH ROTATOR CUFF REPAIR AND SUBACROMIAL DECOMPRESSION Right 05-20-2009  @[facility]    TRANSURETHRAL RESECTION OF BLADDER TUMOR WITH MITOMYCIN -C N/A 02/01/2020   Procedure: TRANSURETHRAL RESECTION OF BLADDER TUMOR;  Surgeon: Watt Rush, MD;  Location: [facility];  Service: Urology;  Laterality: N/A;   TRANSURETHRAL RESECTION OF PROSTATE     Social History:   reports that he quit smoking about 25 years ago. His smoking use included cigarettes. He started smoking about 61 years ago. He has a 36 pack-year smoking history. He has been exposed to tobacco smoke. He has never used smokeless tobacco. He reports that he does not currently use alcohol. He reports that he does not use drugs.  Allergies  Allergen Reactions   Haemophilus Influenzae Vaccines Other (See Comments)    I get deathly sick    History reviewed. No pertinent family history.  Prior to Admission medications   Medication Sig Start Date End Date Taking? Authorizing Provider  acetaminophen  (TYLENOL ) 500 MG tablet Take 1,000 mg by mouth every 6 (six) hours as needed for headache or mild pain (pain score 1-3).   Yes [provider]  docusate sodium (COLACE) 100 MG capsule Take 100 mg by mouth 2 (two) times daily.   Yes [provider]  furosemide (LASIX) 20 MG tablet Take 1 tablet (20 mg total) by mouth daily. 09/21/24 09/21/25 Yes Pahwani, Ravi, MD  megestrol (MEGACE) 40 MG/ML suspension Take 400 mg by mouth every morning. 09/04/24  Yes [provider]  omeprazole (PRILOSEC) 40 MG capsule Take 40 mg by mouth daily. 02/16/23  Yes [provider]  polyethylene glycol (MIRALAX / GLYCOLAX) 17 g packet Take 17 g by mouth daily.   Yes [provider]  rosuvastatin (CRESTOR) 10 MG tablet Take 1 tablet (10 mg total) by mouth daily. 09/22/24  Yes Vernon Ranks, MD  sodium chloride  1 g tablet Take 2 tablets (2 g total) by mouth  3 (three) times daily with meals for 7 days. 09/21/24 09/28/24 Yes Pahwani, Fredia, MD  urea  (URE-NA) 15 g PACK oral packet Take 15 g by mouth 2 (two) times daily for 3 days. 09/21/24 09/24/24 Yes Pahwani, Ravi, MD  amLODipine (NORVASC) 5 MG tablet Take 1 tablet (5 mg total) by mouth daily. 09/22/24   Pahwani, Ravi, MD  tamsulosin (FLOMAX) 0.4 MG CAPS capsule Take 1 capsule (0.4 mg total) by mouth daily after supper. 09/21/24 10/21/24   Vernon Fredia, MD    Physical Exam: Vitals:   09/24/24 0900 09/24/24 0943 09/24/24 1035 09/24/24 1100  BP:  130/72 92/67   Pulse:  (!) 124 (!) 112   Resp:  (!) 27 (!) 30   Temp:  (!) 97.5 F (36.4 C) 98.3 F (36.8 C)   TempSrc:  Oral Oral   SpO2: 94% (!) 87% 91% 92%  Weight:      Height:        Constitutional: Elderly male who appears to be in distress Eyes: PERRL, lids and conjunctivae normal ENMT: Mucous membranes are dry.  Neck: normal, supple  Respiratory: Tachypneic with decreased overall aeration.  No wheezes or rhonchi appreciated. Cardiovascular: Tachycardic.   Abdomen: Significantly distended abdomen with tinkering bowel sounds. Musculoskeletal: no clubbing / cyanosis.  Good ROM, no contractures.  Skin: no rashes, lesions, ulcers. No induration Neurologic: CN 2-12 grossly intact.   Psychiatric: Alert    Data Reviewed:    reviewed labs, imaging, and pertinent records as documented.   Family Communication: Son updated at bedside Primary team communication:  Thank you very much for involving us  in the care of your patient.  Author: Maximino DELENA Sharps, MD 09/24/2024 11:25 AM  For on call review [email or web address].

## 2024-09-25 ENCOUNTER — Encounter (HOSPITAL_COMMUNITY): Payer: Self-pay | Admitting: Surgery

## 2024-09-25 DIAGNOSIS — J90 Pleural effusion, not elsewhere classified: Secondary | ICD-10-CM

## 2024-09-25 LAB — BASIC METABOLIC PANEL WITH GFR
Anion gap: 10 (ref 5–15)
BUN: 23 mg/dL (ref 8–23)
CO2: 25 mmol/L (ref 22–32)
Calcium: 7.5 mg/dL — ABNORMAL LOW (ref 8.9–10.3)
Chloride: 98 mmol/L (ref 98–111)
Creatinine, Ser: 0.56 mg/dL — ABNORMAL LOW (ref 0.61–1.24)
GFR, Estimated: >60 mL/min (ref >60)
Glucose, Bld: 98 mg/dL (ref 70–99)
Potassium: 4.2 mmol/L (ref 3.5–5.1)
Sodium: 133 mmol/L — ABNORMAL LOW (ref 135–145)

## 2024-09-25 LAB — POCT I-STAT 7, (LYTES, BLD GAS, ICA,H+H)
Acid-Base Excess: 4 mmol/L — ABNORMAL HIGH (ref 0.0–2.0)
Bicarbonate: 26.3 mmol/L (ref 20.0–28.0)
Calcium, Ion: 1.01 mmol/L — ABNORMAL LOW (ref 1.15–1.40)
HCT: 31 % — ABNORMAL LOW (ref 39.0–52.0)
Hemoglobin: 10.5 g/dL — ABNORMAL LOW (ref 13.0–17.0)
O2 Saturation: 99 %
Patient temperature: 98.2
Potassium: 4.2 mmol/L (ref 3.5–5.1)
Sodium: 132 mmol/L — ABNORMAL LOW (ref 135–145)
TCO2: 27 mmol/L (ref 22–32)
pCO2 arterial: 29.8 mmHg — ABNORMAL LOW (ref 32–48)
pH, Arterial: 7.553 — ABNORMAL HIGH (ref 7.35–7.45)
pO2, Arterial: 112 mmHg — ABNORMAL HIGH (ref 83–108)

## 2024-09-25 LAB — GLUCOSE, CAPILLARY
Glucose-Capillary: 91 mg/dL (ref 70–99)
Glucose-Capillary: 88 mg/dL (ref 70–99)
Glucose-Capillary: 84 mg/dL (ref 70–99)

## 2024-09-25 LAB — CBC
HCT: 31.9 % — ABNORMAL LOW (ref 39.0–52.0)
Hemoglobin: 11.2 g/dL — ABNORMAL LOW (ref 13.0–17.0)
MCH: 31.1 pg (ref 26.0–34.0)
MCHC: 35.1 g/dL (ref 30.0–36.0)
MCV: 88.6 fL (ref 80.0–100.0)
Platelets: 225 K/uL (ref 150–400)
RBC: 3.6 MIL/uL — ABNORMAL LOW (ref 4.22–5.81)
RDW: 17.9 % — ABNORMAL HIGH (ref 11.5–15.5)
WBC: 4.5 K/uL (ref 4.0–10.5)
nRBC: 0 % (ref 0.0–0.2)

## 2024-09-25 LAB — MAGNESIUM: Magnesium: 1.6 mg/dL — ABNORMAL LOW (ref 1.7–2.4)

## 2024-09-25 LAB — TRIGLYCERIDES: Triglycerides: 55 mg/dL (ref <150)

## 2024-09-25 LAB — PHOSPHORUS: Phosphorus: 4.3 mg/dL (ref 2.5–4.6)

## 2024-09-25 MED ORDER — FENTANYL CITRATE (PF) 50 MCG/ML IJ SOSY
12.5000 ug | PREFILLED_SYRINGE | INTRAMUSCULAR | Status: DC | PRN
Start: 2024-09-25 — End: 2024-10-05
  Administered 2024-09-25 – 2024-09-26 (×6): 50 ug via INTRAVENOUS
  Administered 2024-09-27: 12.5 ug via INTRAVENOUS
  Administered 2024-09-27: 50 ug via INTRAVENOUS
  Administered 2024-09-27: 12.5 ug via INTRAVENOUS
  Administered 2024-09-28 – 2024-09-29 (×4): 25 ug via INTRAVENOUS
  Filled 2024-09-25 (×14): qty 1

## 2024-09-25 MED ORDER — MAGNESIUM SULFATE 2 GM/50ML IV SOLN
2.0000 g | Freq: Once | INTRAVENOUS | Status: AC
  Administered 2024-09-25: 2 g via INTRAVENOUS
  Filled 2024-09-25: qty 50

## 2024-09-25 MED ORDER — CALCIUM GLUCONATE-NACL 2-0.675 GM/100ML-% IV SOLN
2.0000 g | Freq: Once | INTRAVENOUS | Status: AC
  Administered 2024-09-25: 2000 mg via INTRAVENOUS
  Filled 2024-09-25: qty 100

## 2024-09-25 NOTE — Progress Notes (Signed)
 Met with patient today to review current situation . Patient confused ; will come back when family is here. Continue to follow along to provide educational needs to facilitate preparation for discharge.

## 2024-09-25 NOTE — Progress Notes (Signed)
 Occupational Therapy Note  Patient Details  Name: Noah Burnett MRN: [id number] Date of Birth: Jun 09, 1941   Occupational Therapy Discharge Note  This patient was unable to complete the inpatient rehab program due to increased medical complexities; therefore did not meet their long term goals. Pt left the program at a Min-Mod assist level for their  functional ADLs. This patient is being discharged from OT services at this time.  BIMS at time of d/c  Pt unable to complete due to medical status  See CareTool for functional status details.  If the patient is able to return to inpatient rehabilitation within 3 midnights, this may be considered an interrupted stay and therapy services will resume as ordered. Modification and reinstatement of their goals will be made upon completion of therapy service reevaluations.     Nereida Habermann, OTR/L, MSOT  09/25/2024, 5:02 PM

## 2024-09-25 NOTE — Progress Notes (Signed)
 Speech Language Pathology Note  Patient Details  Name: Noah Burnett MRN: [id number] Date of Birth: 1941-11-05 Today's Date: 09/25/2024                                                                 Speech Therapy Discharge Note  This patient was unable to complete the inpatient rehab program due to worsening medical status/discharge to acute care ; therefore, the patient did not meet their long term goals and has been discharged from skilled SLP services at this time.The patient left the program at a modA assist level for overall cognitive functioning. The patient is currently consuming Dys 3 textures with thin liquids with minA assist for use of swallowing compensatory strategies.   See CareTool for functional status details.  If the patient is able to return to inpatient rehabilitation within 3 midnights, this may be considered an interrupted stay and therapy services will resume as ordered. Modification and reinstatement of their goals will be made upon completion of therapy service reevaluations.     [name] 09/25/2024, 10:07 AM

## 2024-09-25 NOTE — Progress Notes (Signed)
 Inpatient Rehabilitation Care Coordinator Discharge Note   Patient Details  Name: Noah Burnett MRN: [id number] Date of Birth: [birth year]   Discharge location: D/c to acute due to medical reasons  Length of Stay: 2 days  Discharge activity level:    Home/community participation:    Patient response un:Yzjouy Literacy - How often do you need to have someone help you when you read instructions, pamphlets, or other written material from your doctor or pharmacy?: Never  Patient response un:Dnrpjo Isolation - How often do you feel lonely or isolated from those around you?: Patient unable to respond  Services provided included: MD, RD, PT, OT, SLP, RN, CM, TR, Pharmacy, Neuropsych, SW  Financial Services:  Field Seismologist Utilized: [employer]  Choices offered to/list presented to:    Follow-up services arranged:              Patient response to transportation need: Is the patient able to respond to transportation needs?: Yes In the past 12 months, has lack of transportation kept you from medical appointments or from getting medications?: No In the past 12 months, has lack of transportation kept you from meetings, work, or from getting things needed for daily living?: No   Patient/Family verbalized understanding of follow-up arrangements:  Yes  Individual responsible for coordination of the follow-up plan: contact pt dtr [name]  Confirmed correct DME delivered: Graeme DELENA Jude 09/25/2024    Comments (or additional information):    Shelvia Fojtik A Jude

## 2024-09-25 NOTE — Plan of Care (Signed)
  Problem: Elimination: Goal: Will not experience complications related to urinary retention Outcome: Progressing   Problem: Pain Managment: Goal: General experience of comfort will improve and/or be controlled Outcome: Progressing   Problem: Safety: Goal: Ability to remain free from injury will improve Outcome: Progressing

## 2024-09-25 NOTE — Progress Notes (Signed)
 Progress Note  1 Day Post-Op  Subjective: On the vent. Son at bedside. Pt alert and reaching for ETT.   Objective: Vital signs in last 24 hours: Temp:  [96.8 F (36 C)-99.2 F (37.3 C)] 99.2 F (37.3 C) (11/17 0840) Pulse Rate:  [55-124] 76 (11/17 0800) Resp:  [13-30] 21 (11/17 0800) BP: (79-151)/(54-88) 115/69 (11/17 0800) SpO2:  [87 %-100 %] 97 % (11/17 0800) Arterial Line BP: (84-175)/(45-99) 131/61 (11/17 0800) FiO2 (%):  [40 %-50 %] 40 % (11/17 0759) Weight:  [48.9 kg] 48.9 kg (11/17 0457) Last BM Date : 09/24/24  Intake/Output from previous day: 11/16 0701 - 11/17 0700 In: 1932.5 [I.V.:1563.1; IV Piggyback:369.3] Out: 1765 [Urine:855; Emesis/NG output:300; Blood:10] Intake/Output this shift: Total I/O In: 12.4 [IV Piggyback:12.4] Out: -   PE: General: WD, chronically ill appearing elderly male, intubated Heart: regular, rate, and rhythm.   Lungs: Intubated on 40% and 5 PEEP Abd: soft, appropriately ttp, honeycomb present to midline with small amount bloody drainage, NGT with thin non-bilious fluid, minimal distention   Lab Results:  Recent Labs    09/24/24 1953 09/25/24 0436 09/25/24 0500  WBC 6.9  --  4.5  HGB 12.9* 10.5* 11.2*  HCT 36.6* 31.0* 31.9*  PLT 281  --  225   BMET Recent Labs    09/24/24 1953 09/25/24 0436 09/25/24 0500  NA 132* 132* 133*  K 4.0 4.2 4.2  CL 96*  --  98  CO2 21*  --  25  GLUCOSE 182*  --  98  BUN 23  --  23  CREATININE 0.53*  --  0.56*  CALCIUM 7.5*  --  7.5*   PT/INR No results for input(s): LABPROT, INR in the last 72 hours. CMP     Component Value Date/Time   NA 133 (L) 09/25/2024 0500   K 4.2 09/25/2024 0500   CL 98 09/25/2024 0500   CO2 25 09/25/2024 0500   GLUCOSE 98 09/25/2024 0500   BUN 23 09/25/2024 0500   CREATININE 0.56 (L) 09/25/2024 0500   CALCIUM 7.5 (L) 09/25/2024 0500   PROT 4.7 (L) 09/24/2024 1953   ALBUMIN 2.5 (L) 09/24/2024 1953   AST 19 09/24/2024 1953   ALT 34 09/24/2024 1953    ALKPHOS 54 09/24/2024 1953   BILITOT 1.1 09/24/2024 1953   GFRNONAA >60 09/25/2024 0500   GFRAA >60 03/27/2018 1003   Lipase     Component Value Date/Time   LIPASE 27 09/08/2024 1229       Studies/Results: DG CHEST PORT 1 VIEW Result Date: 09/24/2024 CLINICAL DATA:  Respiratory failure. EXAM: PORTABLE CHEST 1 VIEW COMPARISON:  09/24/2024. FINDINGS: The heart size and mediastinal contours are within normal limits. There is atherosclerotic calcification of the aorta. An endotracheal tube terminates 3.4 cm above the carina. Scattered airspace opacities are noted in the lungs bilaterally. There is a small pleural effusion on the left. No pneumothorax bilaterally. An enteric tube terminates in the stomach. IMPRESSION: 1. Scattered airspace disease bilaterally, possible atelectasis or infiltrate. 2. Small left pleural effusion. 3. Support apparatus as described above. Electronically Signed   By: Leita Birmingham M.D.   On: 09/24/2024 18:54   DG Abd 1 View Result Date: 09/24/2024 CLINICAL DATA:  Abdominal distension.  NG tube placement. EXAM: ABDOMEN - 1 VIEW COMPARISON:  None Available. FINDINGS: Multiple dilated gas-filled loops of small large bowel are noted. The small bowel measures up to 4 cm. Surgical clips are present over the midline. An enteric tube terminates  in the stomach. Mild airspace disease is present at the left lung base. IMPRESSION: 1. Enteric tube terminates in the stomach. 2. Mildly distended loops gas-filled small and large bowel, possible postoperative ileus. 3. Mild atelectasis or infiltrate at the left lung base. Electronically Signed   By: Leita Birmingham M.D.   On: 09/24/2024 18:28   CT ABDOMEN PELVIS W CONTRAST Addendum Date: 09/24/2024 ADDENDUM REPORT: 09/24/2024 14:17 ADDENDUM: Above findings indicate ischemic small bowel with perforation. Site of perforation unclear as may be related to the narrowed jejunal loop with twisting of the mesentery in the midline abdomen just  right of midline as described. Critical Value/emergent results were called by telephone at the time of interpretation on 09/24/2024 at 2:15 pm to provider [facility] , who verbally acknowledged these results. Electronically Signed   By: Toribio Agreste M.D.   On: 09/24/2024 14:17   Result Date: 09/24/2024 CLINICAL DATA:  Shortness of breath. Abdominal pain. High probability of pulmonary embolism. EXAM: CT ANGIOGRAPHY CHEST CT ABDOMEN AND PELVIS WITH CONTRAST TECHNIQUE: Multidetector CT imaging of the chest was performed using the standard protocol during bolus administration of intravenous contrast. Multiplanar CT image reconstructions and MIPs were obtained to evaluate the vascular anatomy. Multidetector CT imaging of the abdomen and pelvis was performed using the standard protocol during bolus administration of intravenous contrast. RADIATION DOSE REDUCTION: This exam was performed according to the departmental dose-optimization program which includes automated exposure control, adjustment of the mA and/or kV according to patient size and/or use of iterative reconstruction technique. CONTRAST:  75mL OMNIPAQUE  IOHEXOL  350 MG/ML SOLN COMPARISON:  CT abdomen/pelvis 06/20/2024 FINDINGS: CTA CHEST FINDINGS Cardiovascular: Stable borderline cardiomegaly. Calcified plaque over the left anterior descending and right coronary arteries. Thoracic aorta is normal in caliber. Mild calcified plaque over the descending thoracic aorta. Pulmonary arterial system is adequately opacified without evidence of emboli. Remaining vascular structures are unremarkable. Mediastinum/Nodes: No mediastinal or hilar adenopathy. Mild-to-moderate dilatation of the esophagus with air-fluid level. Prominent fluid-filled distal esophagus without significant change from the prior exam which may be due to reflux or dysmotility versus hiatal hernia. Lungs/Pleura: Centrilobular emphysematous disease is present. Lungs are adequately inflated. There is  mild bilateral posterior airspace density which may be due to atelectasis or infection. Small to moderate bilateral pleural effusions right worse than left. Subtle hazy patchy somewhat nodular airspace opacification over the right upper lobe with minimal opacification over the posterior dependent upper lobes. Findings may be due to infection or inflammatory process and less likely underlying neoplasm. Airways are normal. Musculoskeletal: No focal abnormality. Review of the MIP images confirms the above findings. CT ABDOMEN and PELVIS FINDINGS Hepatobiliary: Several liver cysts unchanged. Gallbladder and biliary tree are unremarkable. Pancreas: Normal. Spleen: Normal. Adrenals/Urinary Tract: Adrenal glands are normal. Kidneys are normal in size without hydronephrosis or nephrolithiasis. Bilateral renal cysts unchanged. Ureters are unremarkable. Bladder minimally distended with small focus of air along the nondependent portion likely due to recent instrumentation. Stomach/Bowel: Stomach distended with fluid and air. Dilatation of the third portion of the duodenum measuring 3.2 cm in diameter. There is also dilatation of the proximal jejunum measuring 3.4 cm. The dilated duodenum becomes significantly narrowed as it crosses the midline from left to right just below the level of the aortic bifurcation. Small bowel in this focal area appears to be associated with slight twisting of the mesentery. There are multiple mid to distal small bowel loops which are dilated and demonstrate diffuse pneumatosis. Distal ileal loop measures 3.6 cm in  diameter. There is moderate fecal retention over the rectum. Stool and air-filled right colon, transverse colon and splenic flexure. There is evidence of small amount of free peritoneal air best seen over the right peripelvic region anteriorly. Appendix not definitely visualized. Vascular/Lymphatic: Moderate calcified plaque over the abdominal aorta which is normal caliber. Celiac axis  and superior mesenteric artery and branches appear to be patent. Inferior mesenteric artery not well visualized. Narrowed but patent left internal iliac artery. Portal vein and superior mesenteric vein are patent. No adenopathy. Reproductive: Moderate prostatic enlargement with moderate impression upon the bladder base unchanged. Other: Moderate free fluid over the pelvis. Musculoskeletal: No focal abnormality. Review of the MIP images confirms the above findings. IMPRESSION: 1. No evidence of pulmonary embolism. 2. Small to moderate bilateral pleural effusions right worse than left with associated bibasilar posterior airspace density which may be due to atelectasis or infection. Subtle hazy patchy somewhat nodular airspace opacification over the right upper lobe with minimal opacification over the posterior dependent upper lobes. Findings may be due to infection or inflammatory process and much less likely underlying neoplasm. Recommend follow-up CT 4-6 weeks. 3. Evidence of free peritoneal air with multiple mid to distal dilated small bowel loops demonstrating moderate diffuse pneumatosis. Findings are concerning for bowel ischemia. 4. Significant narrowing of the proximal jejunum as it crosses the midline from left to right just below the level of the aortic bifurcation. Small bowel in this focal area appears to be associated with slight twisting of the mesentery. Findings are concerning for small bowel obstruction due to volvulus or closed loop obstruction. 5. Moderate free fluid over the pelvis. 6. Moderate prostatic enlargement with moderate impression upon the bladder base unchanged. 7. Aortic atherosclerosis. Atherosclerotic coronary artery disease. Aortic Atherosclerosis (ICD10-I70.0) and Emphysema (ICD10-J43.9). Currently patient provider. Electronically Signed: By: Toribio Agreste M.D. On: 09/24/2024 14:04   CT Angio Chest Pulmonary Embolism (PE) W or WO Contrast Addendum Date: 09/24/2024 ADDENDUM  REPORT: 09/24/2024 14:17 ADDENDUM: Above findings indicate ischemic small bowel with perforation. Site of perforation unclear as may be related to the narrowed jejunal loop with twisting of the mesentery in the midline abdomen just right of midline as described. Critical Value/emergent results were called by telephone at the time of interpretation on 09/24/2024 at 2:15 pm to provider [facility] , who verbally acknowledged these results. Electronically Signed   By: Toribio Agreste M.D.   On: 09/24/2024 14:17   Result Date: 09/24/2024 CLINICAL DATA:  Shortness of breath. Abdominal pain. High probability of pulmonary embolism. EXAM: CT ANGIOGRAPHY CHEST CT ABDOMEN AND PELVIS WITH CONTRAST TECHNIQUE: Multidetector CT imaging of the chest was performed using the standard protocol during bolus administration of intravenous contrast. Multiplanar CT image reconstructions and MIPs were obtained to evaluate the vascular anatomy. Multidetector CT imaging of the abdomen and pelvis was performed using the standard protocol during bolus administration of intravenous contrast. RADIATION DOSE REDUCTION: This exam was performed according to the departmental dose-optimization program which includes automated exposure control, adjustment of the mA and/or kV according to patient size and/or use of iterative reconstruction technique. CONTRAST:  75mL OMNIPAQUE  IOHEXOL  350 MG/ML SOLN COMPARISON:  CT abdomen/pelvis 06/20/2024 FINDINGS: CTA CHEST FINDINGS Cardiovascular: Stable borderline cardiomegaly. Calcified plaque over the left anterior descending and right coronary arteries. Thoracic aorta is normal in caliber. Mild calcified plaque over the descending thoracic aorta. Pulmonary arterial system is adequately opacified without evidence of emboli. Remaining vascular structures are unremarkable. Mediastinum/Nodes: No mediastinal or hilar adenopathy. Mild-to-moderate dilatation of  the esophagus with air-fluid level. Prominent  fluid-filled distal esophagus without significant change from the prior exam which may be due to reflux or dysmotility versus hiatal hernia. Lungs/Pleura: Centrilobular emphysematous disease is present. Lungs are adequately inflated. There is mild bilateral posterior airspace density which may be due to atelectasis or infection. Small to moderate bilateral pleural effusions right worse than left. Subtle hazy patchy somewhat nodular airspace opacification over the right upper lobe with minimal opacification over the posterior dependent upper lobes. Findings may be due to infection or inflammatory process and less likely underlying neoplasm. Airways are normal. Musculoskeletal: No focal abnormality. Review of the MIP images confirms the above findings. CT ABDOMEN and PELVIS FINDINGS Hepatobiliary: Several liver cysts unchanged. Gallbladder and biliary tree are unremarkable. Pancreas: Normal. Spleen: Normal. Adrenals/Urinary Tract: Adrenal glands are normal. Kidneys are normal in size without hydronephrosis or nephrolithiasis. Bilateral renal cysts unchanged. Ureters are unremarkable. Bladder minimally distended with small focus of air along the nondependent portion likely due to recent instrumentation. Stomach/Bowel: Stomach distended with fluid and air. Dilatation of the third portion of the duodenum measuring 3.2 cm in diameter. There is also dilatation of the proximal jejunum measuring 3.4 cm. The dilated duodenum becomes significantly narrowed as it crosses the midline from left to right just below the level of the aortic bifurcation. Small bowel in this focal area appears to be associated with slight twisting of the mesentery. There are multiple mid to distal small bowel loops which are dilated and demonstrate diffuse pneumatosis. Distal ileal loop measures 3.6 cm in diameter. There is moderate fecal retention over the rectum. Stool and air-filled right colon, transverse colon and splenic flexure. There is  evidence of small amount of free peritoneal air best seen over the right peripelvic region anteriorly. Appendix not definitely visualized. Vascular/Lymphatic: Moderate calcified plaque over the abdominal aorta which is normal caliber. Celiac axis and superior mesenteric artery and branches appear to be patent. Inferior mesenteric artery not well visualized. Narrowed but patent left internal iliac artery. Portal vein and superior mesenteric vein are patent. No adenopathy. Reproductive: Moderate prostatic enlargement with moderate impression upon the bladder base unchanged. Other: Moderate free fluid over the pelvis. Musculoskeletal: No focal abnormality. Review of the MIP images confirms the above findings. IMPRESSION: 1. No evidence of pulmonary embolism. 2. Small to moderate bilateral pleural effusions right worse than left with associated bibasilar posterior airspace density which may be due to atelectasis or infection. Subtle hazy patchy somewhat nodular airspace opacification over the right upper lobe with minimal opacification over the posterior dependent upper lobes. Findings may be due to infection or inflammatory process and much less likely underlying neoplasm. Recommend follow-up CT 4-6 weeks. 3. Evidence of free peritoneal air with multiple mid to distal dilated small bowel loops demonstrating moderate diffuse pneumatosis. Findings are concerning for bowel ischemia. 4. Significant narrowing of the proximal jejunum as it crosses the midline from left to right just below the level of the aortic bifurcation. Small bowel in this focal area appears to be associated with slight twisting of the mesentery. Findings are concerning for small bowel obstruction due to volvulus or closed loop obstruction. 5. Moderate free fluid over the pelvis. 6. Moderate prostatic enlargement with moderate impression upon the bladder base unchanged. 7. Aortic atherosclerosis. Atherosclerotic coronary artery disease. Aortic  Atherosclerosis (ICD10-I70.0) and Emphysema (ICD10-J43.9). Currently patient provider. Electronically Signed: By: Toribio Agreste M.D. On: 09/24/2024 14:04   DG Abd 1 View Result Date: 09/24/2024 EXAM: 1 VIEW XRAY OF THE  ABDOMEN 09/24/2024 08:39:55 AM COMPARISON: KUB dated 09/18/2024. CLINICAL HISTORY: [id number] Bloating [id number] Bloating FINDINGS: LINES, TUBES AND DEVICES: There is a feeding tube within the stomach, which has its tip in the distal stomach. BOWEL: There is diffuse gastric distention of the small and large bowels. SOFT TISSUES: No opaque urinary calculi. BONES: No acute osseous abnormality. IMPRESSION: 1. Diffuse gastric distention of the small and large bowels. No bowel obstruction. Electronically signed by: Evalene Coho MD 09/24/2024 08:49 AM EST RP Workstation: [id number]   DG CHEST PORT 1 VIEW Result Date: 09/24/2024 EXAM: 1 VIEW(S) XRAY OF THE CHEST 09/24/2024 08:39:55 AM COMPARISON: AP radiograph of the chest dated 09/21/2024. CLINICAL HISTORY: Shortness of breath. FINDINGS: LINES, TUBES AND DEVICES: A feeding tube is present with its tip in the distal stomach. LUNGS AND PLEURA: There is mild atelectasis in the left lung base. No pleural effusion. No pneumothorax. HEART AND MEDIASTINUM: No acute abnormality of the cardiac and mediastinal silhouettes. BONES AND SOFT TISSUES: No acute osseous abnormality. IMPRESSION: 1. Mild atelectasis in the left lung base. 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 Teresa - ok to extubate from surgical standpoint if medically appropriate - would continue NGT as I suspect he may have some post-op ileus  - Continue abx for 24-48 hrs given pneumatosis but no perforation  - discussed with pt's son at bedside  FEN: NPO, IVF per CCM, NGT to LIWS VTE: ok to have SQH or LMWH from surgical standpoint  ID: [email or web address]  11/16>>  - per CCM -  Hx of prostate CA Tobacco use HTN GERD Shingles with encephalitis Recent non-traumatic SAH VDRF   LOS: 1 day     Burnard JONELLE Louder, [facility] Surgery 09/25/2024, 9:30 AM Please see Amion for pager number during day hours 7:00am-4:30pm

## 2024-09-25 NOTE — Plan of Care (Signed)

## 2024-09-25 NOTE — Progress Notes (Signed)
 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 Abdominal distention workup showed finding concerning for bowel ischemia in the setting of a prior gastrojejunostomy.  Imaging showed evidence of free peritoneal air with multiple mid to distal dilated small bowel with moderate diffuse pneumatosis.  Otherwise there is no PE, moderate bilateral pleural effusion was appreciated. On 11/16 patient was taken to the OR for ischemic bowel with perforation and underwent ex lap with reduction and repair of internal hernia with postop findings of internal hernia containing small bowel, and diffuse pneumatosis intestinalis  Pertinent  Medical History  CVA, tobacco use, HTN, robotic gastrojejunostomy for pancreatic head mass  Significant Hospital Events: Including procedures, antibiotic start and stop dates in addition to other pertinent events   11/16 ex lap with reduction and repair of internal hernia with postop findings of internal hernia containing small bowel, diffuse pneumatosis intestinalis  Interim History / Subjective:  Remained intubated overnight & was maintained on continuous fentanyl  and propofol  -- fortunately night nursing kept his RASS higher( 0 +/-1)  than ordered goal of -2  Off all sedation  now   Objective    Blood pressure 115/69, pulse 76, temperature 99.2 F (37.3 C), temperature source Oral, resp. rate (!) 21, height 5' 7.01 (1.702 m), weight 48.9 kg, SpO2 97%.    Vent Mode: PRVC FiO2 (%):  [40 %-50 %] 40 % Set Rate:  [18 bmp-20 bmp] 18 bmp Vt Set:  [530 mL] 530 mL PEEP:  [5 cmH20] 5 cmH20 Plateau Pressure:  [16 cmH20-17 cmH20] 16 cmH20   Intake/Output Summary (Last 24 hours) at 09/25/2024 0943 Last data filed at 09/25/2024 0800 Gross per 24 hour  Intake 1944.89 ml  Output 1765 ml  Net 179.89 ml   Filed Weights   09/25/24 0457  Weight: 48.9 kg    Examination: General: thin, chronically and critically ill appearing elderly M intubated  HENT: NCAT pink mm ETT secure  Lungs: CTAb, mechanically ventialted, even unlabored on PSV  Cardiovascular: s1s2 cap refill < 3 sec  Abdomen: surgical site w overlying honeycomb dressing, scant blood, appears well approximated. Soft. Absent bowel sounds  Extremities: no acute joint deformity, no cyanosis.  Neuro: Awake, following commands  GU: foley   Resolved problem list   Assessment and Plan    Endotracheally intubated  Small bilat pleural effusions  RUL opacity, possible aspiration PNA   Hx tobacco use  P -WUA/SBT, hopeful extubation 11/17 -IS, pulm hygiene, PT/OT consults   -zosyn  as below.   Possible ischemic bowel S/p ex lap +hernia repair  Bowel perf ruled out  P -NPO, keep NGT to liws  -CCS following  -zosyn  for 5 days (if this were to be de-escalated before that time, I would want to maintain aspiration coverage)   Hypomagnesemia Hypocalcemia Hyponatremia, mild  P -replace mag and ca   HTN Diastolic HF  P -holding home meds -if needed can add IV agents  Elevated LA -follow PRN    Labs   CBC: Recent Labs  Lab 09/22/24 0526 09/24/24 1706 09/24/24 1953 09/25/24 0436 09/25/24 0500  WBC 9.6  --  6.9  --  4.5  NEUTROABS 8.3*  --  6.3  --   --   HGB 12.5* 10.9* 12.9* 10.5* 11.2*   HCT 36.3* 32.0* 36.6* 31.0* 31.9*  MCV 88.8  --  88.0  --  88.6  PLT 208  --  281  --  225    Basic Metabolic Panel: Recent Labs  Lab 09/19/24 0159 09/20/24 0140 09/21/24 0501 09/21/24 1955 09/22/24 0526 09/23/24 0430 09/24/24 0535 09/24/24 1706 09/24/24 1953 09/25/24 0436 09/25/24 0500  NA 123* 123* 124*  --  132* 130* 130* 132* 132* 132* 133*  K 3.5 3.2* 3.3*  --  4.8 3.9 3.9 4.0 4.0 4.2 4.2  CL 92* 90* 90*  --  100 97* 93*  --  96*  --  98  CO2 20* 22 25  --  22 23 25   --  21*  --  25  GLUCOSE 196* 183* 176*  --  136* 128* 167*  --  182*  --  98  BUN 14 16 30*  --  23 18 20   --  23  --  23  CREATININE 0.57* 0.46* 0.54*   < > 0.53* 0.48* 0.56*  --  0.53*  --  0.56*  CALCIUM 7.1* 7.0* 6.6*  --  7.1* 7.1* 7.3*  --  7.5*  --  7.5*  MG 2.0  --   --   --   --   --   --   --  1.6*  --  1.6*  PHOS 2.0* <1.0* 2.2*  --   --   --   --   --  4.4  --  4.3   < > = values in this interval not displayed.   GFR: Estimated Creatinine Clearance: 50.1 mL/min (A) (by C-G formula based on SCr of 0.56 mg/dL (L)). Recent Labs  Lab 09/22/24 0526 09/24/24 1851 09/24/24 1953 09/24/24 2138 09/25/24 0500  WBC 9.6  --  6.9  --  4.5  LATICACIDVEN  --  1.2  --  2.1*  --     Liver Function Tests: Recent Labs  Lab 09/19/24 0159 09/20/24 0140 09/21/24 0501 09/22/24 0526 09/24/24 1953  AST  --   --   --  18 19  ALT  --   --   --  26 34  ALKPHOS  --   --   --  48 54  BILITOT  --   --   --  0.7 1.1  PROT  --   --   --  4.1* 4.7*  ALBUMIN 2.4* 2.4* 2.2* 2.0* 2.5*   No results for input(s): LIPASE, AMYLASE in the last 168 hours. No results for input(s): AMMONIA in the last 168 hours.  ABG    Component Value Date/Time   PHART 7.553 (H) 09/25/2024 0436   PCO2ART 29.8 (L) 09/25/2024 0436   PO2ART 112 (H) 09/25/2024 0436   HCO3 26.3 09/25/2024 0436   TCO2 27 09/25/2024 0436   O2SAT 99 09/25/2024 0436     Coagulation Profile: No results for input(s): INR, PROTIME in the  last 168 hours.  Cardiac Enzymes: No results for input(s): CKTOTAL, CKMB, CKMBINDEX, TROPONINI in the last 168 hours.  HbA1C: Hgb A1c MFr Bld  Date/Time Value Ref Range Status  09/10/2024 02:55 AM 5.6 4.8 -  5.6 % Final    Comment:    (NOTE) Diagnosis of Diabetes The following HbA1c ranges recommended by the American Diabetes Association (ADA) may be used as an aid in the diagnosis of diabetes mellitus.  Hemoglobin             Suggested A1C NGSP%              Diagnosis  <5.7                   Non Diabetic  5.7-6.4                Pre-Diabetic  >6.4                   Diabetic  <7.0                   Glycemic control for                       adults with diabetes.      CBG: Recent Labs  Lab 09/24/24 0901 09/24/24 1856 09/24/24 1937 09/24/24 2327 09/25/24 0838  GLUCAP 186* 139* 146* 116* 91    CRITICAL CARE Performed by: Ronnald FORBES Gave   Total critical care time: 38 minutes  Critical care time was exclusive of separately billable procedures and treating other patients. Critical care was necessary to treat or prevent imminent or life-threatening deterioration.  Critical care was time spent personally by me on the following activities: development of treatment plan with patient and/or surrogate as well as nursing, discussions with consultants, evaluation of patient's response to treatment, examination of patient, obtaining history from patient or surrogate, ordering and performing treatments and interventions, ordering and review of laboratory studies, ordering and review of radiographic studies, pulse oximetry and re-evaluation of patient's condition.  Ronnald Gave MSN, AGACNP-BC [facility] Amion for pager  09/25/2024, 9:43 AM

## 2024-09-25 NOTE — Procedures (Signed)
 Extubation Procedure Note  Patient Details:   Name: Noah Burnett DOB: [birth year] MRN: [id number]   Airway Documentation:    Vent end date: 09/25/24 Vent end time: 1009   Evaluation  O2 sats: stable throughout Complications: No apparent complications Patient did tolerate procedure well. Bilateral Breath Sounds: Clear, Diminished   Yes  Patient extubated per order to 4L [name] with no apparent complications. Positive cuff leak was noted prior to extubation. Patient is alert and is able to weakly speak. Vitals are stable. RT will continue to monitor.   Emilene Roma CHRISTELLA Anon 09/25/2024, 10:31 AM

## 2024-09-25 NOTE — TOC Initial Note (Signed)
 Transition of Care [facility]) - Initial/Assessment Note    Patient Details  Name: Noah Burnett MRN: [id number] Date of Birth: 1941-09-12  Transition of Care [facility] CM/SW Contact:    Justina Delcia Czar, RN Phone Number: 09/25/2024, 4:57 PM  Clinical Narrative:     Readmission  Spoke to pt and daughter, [name] at bedside. Pt was unable to answer question. He was lethargic. Dtr states pt was recently dc to IP rehab. States she is hoping he can go back to IP rehab. Explained PT/OT will evaluate and make recommendations.   Will continue to follow for dc needs.               Expected Discharge Plan: IP Rehab Facility Barriers to Discharge: Continued Medical Work up   Patient Goals and CMS Choice   CMS Medicare.gov Compare Post Acute Care list provided to:: Patient Represenative (must comment) Choice offered to / list presented to : Adult Children      Expected Discharge Plan and Services   Discharge Planning Services: CM Consult Post Acute Care Choice: IP Rehab Living arrangements for the past 2 months: Single Family Home                                      Prior Living Arrangements/Services Living arrangements for the past 2 months: Single Family Home Lives with:: Self Patient language and need for interpreter reviewed:: Yes Do you feel safe going back to the place where you live?: Yes      Need for Family Participation in Patient Care: Yes (Comment) Care giver support system in place?: Yes (comment) Current home services: DME (walker, wheelchair, shower seat) Criminal Activity/Legal Involvement Pertinent to Current Situation/Hospitalization: No - Comment as needed  Activities of Daily Living      Permission Sought/Granted Permission sought to share information with : Case Manager, Family Supports, PCP Permission granted to share information with : Yes, Verbal Permission Granted  Share Information with NAME: Tanya Wingate  Permission granted to share info w  AGENCY: PCP, IP rehab, DME, Home Health  Permission granted to share info w Relationship: daughter  Permission granted to share info w Contact Information: [phone]  Emotional Assessment Appearance:: Appears stated age Attitude/Demeanor/Rapport: Lethargic          Admission diagnosis:  SBO (small bowel obstruction) (HCC) [K56.609] Patient Active Problem List   Diagnosis Date Noted   SBO (small bowel obstruction) (HCC) 09/24/2024   Pressure injury of skin 09/24/2024   Subarachnoid hemorrhage, nontraumatic (HCC) 09/21/2024   Viral encephalitis 09/21/2024   Protein-calorie malnutrition, severe 09/11/2024   Failure to thrive in adult 09/08/2024   H/O bypass gastrojejunostomy 07/24/2024   Gastric outlet obstruction 06/27/2024   Hyponatremia 06/26/2024   Prediabetes 11/20/2021   Essential hypertension 05/08/2021   PCP:  [name], MD Pharmacy:   [facility] [address], [place] - [address] [place] [address] [place] [address] Phone: [phone] Fax: [phone]  [facility] [address] [place] [address] Phone: [phone] Fax: [phone]     Social Drivers of Health (SDOH) Social History: SDOH Screenings   Food Insecurity: No Food Insecurity (09/21/2024)  Housing: Low Risk  (09/21/2024)  Transportation Needs: No Transportation Needs (09/21/2024)  Utilities: Not At Risk (09/21/2024)  Social Connections: Unknown (09/08/2024)  Tobacco Use: Medium Risk (09/21/2024)   SDOH Interventions:  Readmission Risk Interventions     No data to display

## 2024-09-25 NOTE — Plan of Care (Signed)
 Patient was transferred to acute care unable to complete plan of care.

## 2024-09-25 NOTE — Progress Notes (Signed)
 Physical Therapy Note  Patient Details  Name: Noah Burnett MRN: [id number] Date of Birth: Aug 06, 1941 Today's Date: 09/25/2024    Physical Therapy Discharge Note  This patient was unable to complete the inpatient rehab program due to unplanned DC ; therefore did not meet their long term goals. Pt left the program at a min assist level for their functional mobility/ transfers. This patient is being discharged from PT services at this time.  Pt's perception of pain in the last five days was unable to answer at this time.    See CareTool for functional status details  If the patient is able to return to inpatient rehabilitation within 3 midnights, this may be considered an interrupted stay and therapy services will resume as ordered. Modification and reinstatement of their goals will be made upon completion of therapy service reevaluations.     Reche Ohara PT, DPT 09/25/2024, 12:50 PM

## 2024-09-26 DIAGNOSIS — J189 Pneumonia, unspecified organism: Secondary | ICD-10-CM | POA: Diagnosis not present

## 2024-09-26 DIAGNOSIS — K56609 Unspecified intestinal obstruction, unspecified as to partial versus complete obstruction: Secondary | ICD-10-CM

## 2024-09-26 DIAGNOSIS — I10 Essential (primary) hypertension: Secondary | ICD-10-CM | POA: Diagnosis not present

## 2024-09-26 DIAGNOSIS — E871 Hypo-osmolality and hyponatremia: Secondary | ICD-10-CM

## 2024-09-26 DIAGNOSIS — I609 Nontraumatic subarachnoid hemorrhage, unspecified: Secondary | ICD-10-CM

## 2024-09-26 DIAGNOSIS — L8995 Pressure ulcer of unspecified site, unstageable: Secondary | ICD-10-CM

## 2024-09-26 DIAGNOSIS — E43 Unspecified severe protein-calorie malnutrition: Secondary | ICD-10-CM

## 2024-09-26 LAB — BASIC METABOLIC PANEL WITH GFR
Anion gap: 11 (ref 5–15)
BUN: 19 mg/dL (ref 8–23)
CO2: 24 mmol/L (ref 22–32)
Calcium: 7.3 mg/dL — ABNORMAL LOW (ref 8.9–10.3)
Chloride: 99 mmol/L (ref 98–111)
Creatinine, Ser: 0.61 mg/dL (ref 0.61–1.24)
GFR, Estimated: >60 mL/min (ref >60)
Glucose, Bld: 76 mg/dL (ref 70–99)
Potassium: 3.3 mmol/L — ABNORMAL LOW (ref 3.5–5.1)
Sodium: 134 mmol/L — ABNORMAL LOW (ref 135–145)

## 2024-09-26 LAB — CBC
HCT: 30.4 % — ABNORMAL LOW (ref 39.0–52.0)
Hemoglobin: 10.7 g/dL — ABNORMAL LOW (ref 13.0–17.0)
MCH: 31.5 pg (ref 26.0–34.0)
MCHC: 35.2 g/dL (ref 30.0–36.0)
MCV: 89.4 fL (ref 80.0–100.0)
Platelets: 237 K/uL (ref 150–400)
RBC: 3.4 MIL/uL — ABNORMAL LOW (ref 4.22–5.81)
RDW: 17.9 % — ABNORMAL HIGH (ref 11.5–15.5)
WBC: 5.2 K/uL (ref 4.0–10.5)
nRBC: 0 % (ref 0.0–0.2)

## 2024-09-26 LAB — MAGNESIUM: Magnesium: 1.8 mg/dL (ref 1.7–2.4)

## 2024-09-26 LAB — GLUCOSE, CAPILLARY
Glucose-Capillary: 77 mg/dL (ref 70–99)
Glucose-Capillary: 66 mg/dL — ABNORMAL LOW (ref 70–99)
Glucose-Capillary: 97 mg/dL (ref 70–99)

## 2024-09-26 MED ORDER — LIDOCAINE 5 % EX PTCH
1.0000 | MEDICATED_PATCH | CUTANEOUS | Status: DC
  Administered 2024-09-26 – 2024-10-06 (×11): 1 via TRANSDERMAL
  Filled 2024-09-26 (×11): qty 1

## 2024-09-26 MED ORDER — PANTOPRAZOLE SODIUM 40 MG IV SOLR
40.0000 mg | INTRAVENOUS | Status: DC
  Administered 2024-09-26 – 2024-10-02 (×6): 40 mg via INTRAVENOUS
  Filled 2024-09-26 (×7): qty 10

## 2024-09-26 MED ORDER — METHOCARBAMOL 1000 MG/10ML IJ SOLN
500.0000 mg | Freq: Four times a day (QID) | INTRAMUSCULAR | Status: DC | PRN
Start: 2024-09-26 — End: 2024-10-06

## 2024-09-26 MED ORDER — POTASSIUM CHLORIDE 10 MEQ/100ML IV SOLN
10.0000 meq | INTRAVENOUS | Status: AC
Start: 2024-09-26 — End: 2024-09-26
  Administered 2024-09-26 (×4): 10 meq via INTRAVENOUS
  Filled 2024-09-26 (×4): qty 100

## 2024-09-26 MED ORDER — MAGNESIUM SULFATE 2 GM/50ML IV SOLN
2.0000 g | Freq: Once | INTRAVENOUS | Status: AC
  Administered 2024-09-26: 2 g via INTRAVENOUS
  Filled 2024-09-26: qty 50

## 2024-09-26 MED ORDER — DEXTROSE 50 % IV SOLN
INTRAVENOUS | Status: AC
  Filled 2024-09-26: qty 50

## 2024-09-26 MED ORDER — ACETAMINOPHEN 10 MG/ML IV SOLN
1000.0000 mg | Freq: Four times a day (QID) | INTRAVENOUS | Status: AC
  Administered 2024-09-26 – 2024-09-27 (×4): 1000 mg via INTRAVENOUS
  Filled 2024-09-26 (×4): qty 100

## 2024-09-26 MED ORDER — DEXTROSE 50 % IV SOLN
12.5000 g | INTRAVENOUS | Status: AC
  Administered 2024-09-26: 12.5 g via INTRAVENOUS

## 2024-09-26 MED ORDER — DEXTROSE-SODIUM CHLORIDE 5-0.9 % IV SOLN: INTRAVENOUS | Status: AC

## 2024-09-26 MED ORDER — PIPERACILLIN-TAZOBACTAM 3.375 G IVPB
3.3750 g | Freq: Three times a day (TID) | INTRAVENOUS | Status: AC
Start: 2024-09-26 — End: 2024-09-29
  Administered 2024-09-26 – 2024-09-28 (×8): 3.375 g via INTRAVENOUS
  Filled 2024-09-26 (×8): qty 50

## 2024-09-26 MED ORDER — HEPARIN SODIUM (PORCINE) 5000 UNIT/ML IJ SOLN
5000.0000 [IU] | Freq: Three times a day (TID) | INTRAMUSCULAR | Status: DC
  Administered 2024-09-26 – 2024-10-06 (×28): 5000 [IU] via SUBCUTANEOUS
  Filled 2024-09-26 (×26): qty 1

## 2024-09-26 NOTE — Assessment & Plan Note (Signed)
 11/16 Sp exploratory laparotomy with reduction and repair of internal hernia.   Post operative ileus Plan to clamp NG tube today.  Possible advance diet depending on bowel function.

## 2024-09-26 NOTE — Assessment & Plan Note (Signed)
 Continue pt and ot

## 2024-09-26 NOTE — Progress Notes (Signed)
 Inpatient Rehab Admissions Coordinator:   Spoke with Pt.'s daughter and she states they do want Pt. To return to CIR once medically ready. I will follow for potential admit once ready.  Leita Kleine, MS, CCC-SLP Rehab Admissions Coordinator  [phone] (celll) [phone] (office)

## 2024-09-26 NOTE — Assessment & Plan Note (Signed)
 Post operative respiratory failure, now resolved.  02 saturation 99% on 3 L/min per [facility]   Right upper lobe aspiration pneumonia  11/16 follow up chest radiograph with no frank infiltrate.  Will plan to complete antibiotic therapy for 5 days.  Continue to mobilize, PT and OT.

## 2024-09-26 NOTE — Assessment & Plan Note (Signed)
Continue local skin care.

## 2024-09-26 NOTE — Assessment & Plan Note (Signed)
 Continue with nutritional supplements.

## 2024-09-26 NOTE — Evaluation (Signed)
 Occupational Therapy Evaluation Patient Details Name: Noah Burnett MRN: [id number] DOB: October 10, 1941 Today's Date: 09/26/2024   History of Present Illness   83 y/o M presenting from CIR on 11/16 with acute respiratory hypoxia and abdominal distention, found to have ischemia bowel perforation s/p ex lap with reduction and repair of internal hernia on 09/24/2024    PMH includes duodenal mass s/p robotic gastrojejunostomy, nontraumatic SAH     Clinical Impressions Pt min A with RW for hall ambulation while on CIR, was still needing some assist for ADLs, per pt was living alone and ind with ADL prior to CIR stay. Pt currently HOH and ?impaired cognition, needs up to mod A overall for ADLs, CGA for bed mobility and min A for pivot transfer with RW. Pt with flexed posture and incr reliance on RW 2/2 abdominal pain. Pt presenting with impairments listed below, will follow acutely. Patient will benefit from intensive inpatient follow-up therapy, >3 hours/day to maximize safety/ind with ADL/functional mobility.      If plan is discharge home, recommend the following:   A little help with walking and/or transfers;A lot of help with bathing/dressing/bathroom;Direct supervision/assist for financial management;Assistance with cooking/housework;Direct supervision/assist for medications management;Help with stairs or ramp for entrance;Supervision due to cognitive status     Functional Status Assessment   Patient has had a recent decline in their functional status and demonstrates the ability to make significant improvements in function in a reasonable and predictable amount of time.     Equipment Recommendations   Other (comment);BSC/3in1 (RW)     Recommendations for Other Services   PT consult     Precautions/Restrictions   Precautions Precautions: Fall Precaution/Restrictions Comments: NGT Restrictions Weight Bearing Restrictions Per Provider Order: No     Mobility Bed  Mobility Overal bed mobility: Needs Assistance Bed Mobility: Supine to Sit     Supine to sit: Contact guard          Transfers Overall transfer level: Needs assistance Equipment used: Rolling walker (2 wheels) Transfers: Sit to/from Stand Sit to Stand: Min assist     Step pivot transfers: Min assist            Balance Overall balance assessment: Needs assistance Sitting-balance support: Feet supported Sitting balance-Leahy Scale: Fair Sitting balance - Comments: flexed posture 2/2 abd pain   Standing balance support: Reliant on assistive device for balance, During functional activity Standing balance-Leahy Scale: Poor Standing balance comment: reliant on external support                           ADL either performed or assessed with clinical judgement   ADL Overall ADL's : Needs assistance/impaired Eating/Feeding: Set up;Sitting   Grooming: Set up;Standing   Upper Body Bathing: Minimal assistance   Lower Body Bathing: Moderate assistance   Upper Body Dressing : Minimal assistance   Lower Body Dressing: Moderate assistance   Toilet Transfer: Minimal assistance;Stand-pivot;BSC/3in1;Rolling walker (2 wheels)           Functional mobility during ADLs: Minimal assistance;Rolling walker (2 wheels)       Vision   Vision Assessment?: No apparent visual deficits     Perception Perception: Not tested       Praxis Praxis: Not tested       Pertinent Vitals/Pain Pain Assessment Pain Assessment: Faces Pain Score: 8  Faces Pain Scale: Hurts whole lot Pain Location: abdomen Pain Descriptors / Indicators: Discomfort, Grimacing Pain Intervention(s): Limited activity within patient's  tolerance, Monitored during session, Repositioned     Extremity/Trunk Assessment Upper Extremity Assessment Upper Extremity Assessment: Generalized weakness (difficulty with FMC to open suction)   Lower Extremity Assessment Lower Extremity Assessment: Defer  to PT evaluation   Cervical / Trunk Assessment Cervical / Trunk Assessment: Kyphotic   Communication Communication Communication: Impaired Factors Affecting Communication: Hearing impaired   Cognition Arousal: Alert Behavior During Therapy: Flat affect Cognition: Cognition impaired     Awareness: Online awareness impaired     Executive functioning impairment (select all impairments): Initiation OT - Cognition Comments: incr time to follow all commands, but possibly related to [facility]                 Following commands: Impaired Following commands impaired: Follows one step commands with increased time     Cueing  General Comments   Cueing Techniques: Verbal cues;Tactile cues;Visual cues  VSS on RA   Exercises     Shoulder Instructions      Home Living Family/patient expects to be discharged to:: Inpatient rehab Living Arrangements: Alone Available Help at Discharge: Family Type of Home: House Home Access: Stairs to enter [employer] of Steps: 1 Entrance Stairs-Rails: None Home Layout: One level     Bathroom Shower/Tub: Producer, Television/film/video: Handicapped height Bathroom Accessibility: Yes How Accessible: Accessible via walker;Accessible via wheelchair Home Equipment: Shower Counsellor (2 wheels);Wheelchair - manual          Prior Functioning/Environment Prior Level of Function : Needs assist;Driving;History of Falls (last six months)             Mobility Comments: min A wtih RW per CIR notes ADLs Comments: ind with ADLs prior to admission, reports he was driving    OT Problem List: Decreased strength;Decreased range of motion;Decreased activity tolerance;Impaired balance (sitting and/or standing);Decreased cognition;Decreased safety awareness;Decreased coordination;Impaired UE functional use   OT Treatment/Interventions: Self-care/ADL training;Therapeutic exercise;Energy conservation;DME and/or AE  instruction;Therapeutic activities;Balance training;Patient/family education;Cognitive remediation/compensation      OT Goals(Current goals can be found in the care plan section)   Acute Rehab OT Goals Patient Stated Goal: did not state OT Goal Formulation: With patient Time For Goal Achievement: 10/10/24 Potential to Achieve Goals: Good ADL Goals Pt Will Perform Grooming: with supervision;sitting Pt Will Perform Upper Body Dressing: with contact guard assist;sitting;standing Pt Will Perform Lower Body Dressing: with contact guard assist;sit to/from stand;sitting/lateral leans Pt Will Transfer to Toilet: with contact guard assist;ambulating;regular height toilet Additional ADL Goal #1: pt will tolerate OOB standing activity x10 min in prep for OOB ADLs   OT Frequency:  Min 2X/week    Co-evaluation              AM-PAC OT 6 Clicks Daily Activity     Outcome Measure Help from another person eating meals?: A Little Help from another person taking care of personal grooming?: A Little Help from another person toileting, which includes using toliet, bedpan, or urinal?: A Lot Help from another person bathing (including washing, rinsing, drying)?: A Lot Help from another person to put on and taking off regular upper body clothing?: A Lot Help from another person to put on and taking off regular lower body clothing?: A Lot 6 Click Score: 14   End of Session Equipment Utilized During Treatment: Rolling walker (2 wheels) Nurse Communication: Mobility status  Activity Tolerance: Patient tolerated treatment well Patient left: in chair;with call bell/phone within reach;with chair alarm set;with family/visitor present  OT Visit Diagnosis: Unsteadiness on feet (  R26.81);Other abnormalities of gait and mobility (R26.89);Muscle weakness (generalized) (M62.81)                Time: 1141-1200 OT Time Calculation (min): 19 min Charges:  OT General Charges $OT Visit: 1 Visit OT  Evaluation $OT Eval Moderate Complexity: 1 Mod  Bernis Schreur K, OTD, OTR/L SecureChat Preferred Acute Rehab [phone]   Adaly Puder K Koonce 09/26/2024, 1:02 PM

## 2024-09-26 NOTE — Progress Notes (Signed)
 Progress Note  2 Days Post-Op  Subjective: Extubated yesterday, no family at bedside yet this AM. Pt unable to tell me if passing any flatus or not. NGT functioning well.   Objective: Vital signs in last 24 hours: Temp:  [97.5 F (36.4 C)-98.1 F (36.7 C)] 97.5 F (36.4 C) (11/18 0733) Pulse Rate:  [63-106] 84 (11/18 0700) Resp:  [15-25] 16 (11/18 0700) BP: (95-141)/(58-89) 133/79 (11/18 0700) SpO2:  [93 %-100 %] 99 % (11/18 0700) Arterial Line BP: (122)/(81) 122/81 (11/17 1000) FiO2 (%):  [36 %] 36 % (11/17 1011) Weight:  [48.9 kg] 48.9 kg (11/18 0437) Last BM Date : 09/24/24  Intake/Output from previous day: 11/17 0701 - 11/18 0700 In: 302 [IV Piggyback:302] Out: 1925 [Urine:925; Emesis/NG output:1000] Intake/Output this shift: No intake/output data recorded.  PE: General: WD, chronically ill appearing elderly male, NAD Heart: regular, rate, and rhythm.   Lungs: normal effort Abd: soft, appropriately ttp, honeycomb present to midline with small amount bloody drainage, NGT with thin bilious fluid, minimal distention GU: foley present with straw colored urine    Lab Results:  Recent Labs    09/25/24 0500 09/26/24 0353  WBC 4.5 5.2  HGB 11.2* 10.7*  HCT 31.9* 30.4*  PLT 225 237   BMET Recent Labs    09/25/24 0500 09/26/24 0353  NA 133* 134*  K 4.2 3.3*  CL 98 99  CO2 25 24  GLUCOSE 98 76  BUN 23 19  CREATININE 0.56* 0.61  CALCIUM 7.5* 7.3*   PT/INR No results for input(s): LABPROT, INR in the last 72 hours. CMP     Component Value Date/Time   NA 134 (L) 09/26/2024 0353   K 3.3 (L) 09/26/2024 0353   CL 99 09/26/2024 0353   CO2 24 09/26/2024 0353   GLUCOSE 76 09/26/2024 0353   BUN 19 09/26/2024 0353   CREATININE 0.61 09/26/2024 0353   CALCIUM 7.3 (L) 09/26/2024 0353   PROT 4.7 (L) 09/24/2024 1953   ALBUMIN 2.5 (L) 09/24/2024 1953   AST 19 09/24/2024 1953   ALT 34 09/24/2024 1953   ALKPHOS 54 09/24/2024 1953   BILITOT 1.1 09/24/2024  1953   GFRNONAA >60 09/26/2024 0353   GFRAA >60 03/27/2018 1003   Lipase     Component Value Date/Time   LIPASE 27 09/08/2024 1229       Studies/Results: DG CHEST PORT 1 VIEW Result Date: 09/24/2024 CLINICAL DATA:  Respiratory failure. EXAM: PORTABLE CHEST 1 VIEW COMPARISON:  09/24/2024. FINDINGS: The heart size and mediastinal contours are within normal limits. There is atherosclerotic calcification of the aorta. An endotracheal tube terminates 3.4 cm above the carina. Scattered airspace opacities are noted in the lungs bilaterally. There is a small pleural effusion on the left. No pneumothorax bilaterally. An enteric tube terminates in the stomach. IMPRESSION: 1. Scattered airspace disease bilaterally, possible atelectasis or infiltrate. 2. Small left pleural effusion. 3. Support apparatus as described above. Electronically Signed   By: Leita Birmingham M.D.   On: 09/24/2024 18:54   DG Abd 1 View Result Date: 09/24/2024 CLINICAL DATA:  Abdominal distension.  NG tube placement. EXAM: ABDOMEN - 1 VIEW COMPARISON:  None Available. FINDINGS: Multiple dilated gas-filled loops of small large bowel are noted. The small bowel measures up to 4 cm. Surgical clips are present over the midline. An enteric tube terminates in the stomach. Mild airspace disease is present at the left lung base. IMPRESSION: 1. Enteric tube terminates in the stomach. 2. Mildly distended loops  gas-filled small and large bowel, possible postoperative ileus. 3. Mild atelectasis or infiltrate at the left lung base. Electronically Signed   By: Leita Birmingham M.D.   On: 09/24/2024 18:28   CT ABDOMEN PELVIS W CONTRAST Addendum Date: 09/24/2024 ADDENDUM REPORT: 09/24/2024 14:17 ADDENDUM: Above findings indicate ischemic small bowel with perforation. Site of perforation unclear as may be related to the narrowed jejunal loop with twisting of the mesentery in the midline abdomen just right of midline as described. Critical Value/emergent  results were called by telephone at the time of interpretation on 09/24/2024 at 2:15 pm to provider [facility] , who verbally acknowledged these results. Electronically Signed   By: Toribio Agreste M.D.   On: 09/24/2024 14:17   Result Date: 09/24/2024 CLINICAL DATA:  Shortness of breath. Abdominal pain. High probability of pulmonary embolism. EXAM: CT ANGIOGRAPHY CHEST CT ABDOMEN AND PELVIS WITH CONTRAST TECHNIQUE: Multidetector CT imaging of the chest was performed using the standard protocol during bolus administration of intravenous contrast. Multiplanar CT image reconstructions and MIPs were obtained to evaluate the vascular anatomy. Multidetector CT imaging of the abdomen and pelvis was performed using the standard protocol during bolus administration of intravenous contrast. RADIATION DOSE REDUCTION: This exam was performed according to the departmental dose-optimization program which includes automated exposure control, adjustment of the mA and/or kV according to patient size and/or use of iterative reconstruction technique. CONTRAST:  75mL OMNIPAQUE  IOHEXOL  350 MG/ML SOLN COMPARISON:  CT abdomen/pelvis 06/20/2024 FINDINGS: CTA CHEST FINDINGS Cardiovascular: Stable borderline cardiomegaly. Calcified plaque over the left anterior descending and right coronary arteries. Thoracic aorta is normal in caliber. Mild calcified plaque over the descending thoracic aorta. Pulmonary arterial system is adequately opacified without evidence of emboli. Remaining vascular structures are unremarkable. Mediastinum/Nodes: No mediastinal or hilar adenopathy. Mild-to-moderate dilatation of the esophagus with air-fluid level. Prominent fluid-filled distal esophagus without significant change from the prior exam which may be due to reflux or dysmotility versus hiatal hernia. Lungs/Pleura: Centrilobular emphysematous disease is present. Lungs are adequately inflated. There is mild bilateral posterior airspace density which may be  due to atelectasis or infection. Small to moderate bilateral pleural effusions right worse than left. Subtle hazy patchy somewhat nodular airspace opacification over the right upper lobe with minimal opacification over the posterior dependent upper lobes. Findings may be due to infection or inflammatory process and less likely underlying neoplasm. Airways are normal. Musculoskeletal: No focal abnormality. Review of the MIP images confirms the above findings. CT ABDOMEN and PELVIS FINDINGS Hepatobiliary: Several liver cysts unchanged. Gallbladder and biliary tree are unremarkable. Pancreas: Normal. Spleen: Normal. Adrenals/Urinary Tract: Adrenal glands are normal. Kidneys are normal in size without hydronephrosis or nephrolithiasis. Bilateral renal cysts unchanged. Ureters are unremarkable. Bladder minimally distended with small focus of air along the nondependent portion likely due to recent instrumentation. Stomach/Bowel: Stomach distended with fluid and air. Dilatation of the third portion of the duodenum measuring 3.2 cm in diameter. There is also dilatation of the proximal jejunum measuring 3.4 cm. The dilated duodenum becomes significantly narrowed as it crosses the midline from left to right just below the level of the aortic bifurcation. Small bowel in this focal area appears to be associated with slight twisting of the mesentery. There are multiple mid to distal small bowel loops which are dilated and demonstrate diffuse pneumatosis. Distal ileal loop measures 3.6 cm in diameter. There is moderate fecal retention over the rectum. Stool and air-filled right colon, transverse colon and splenic flexure. There is evidence of small amount  of free peritoneal air best seen over the right peripelvic region anteriorly. Appendix not definitely visualized. Vascular/Lymphatic: Moderate calcified plaque over the abdominal aorta which is normal caliber. Celiac axis and superior mesenteric artery and branches appear to be  patent. Inferior mesenteric artery not well visualized. Narrowed but patent left internal iliac artery. Portal vein and superior mesenteric vein are patent. No adenopathy. Reproductive: Moderate prostatic enlargement with moderate impression upon the bladder base unchanged. Other: Moderate free fluid over the pelvis. Musculoskeletal: No focal abnormality. Review of the MIP images confirms the above findings. IMPRESSION: 1. No evidence of pulmonary embolism. 2. Small to moderate bilateral pleural effusions right worse than left with associated bibasilar posterior airspace density which may be due to atelectasis or infection. Subtle hazy patchy somewhat nodular airspace opacification over the right upper lobe with minimal opacification over the posterior dependent upper lobes. Findings may be due to infection or inflammatory process and much less likely underlying neoplasm. Recommend follow-up CT 4-6 weeks. 3. Evidence of free peritoneal air with multiple mid to distal dilated small bowel loops demonstrating moderate diffuse pneumatosis. Findings are concerning for bowel ischemia. 4. Significant narrowing of the proximal jejunum as it crosses the midline from left to right just below the level of the aortic bifurcation. Small bowel in this focal area appears to be associated with slight twisting of the mesentery. Findings are concerning for small bowel obstruction due to volvulus or closed loop obstruction. 5. Moderate free fluid over the pelvis. 6. Moderate prostatic enlargement with moderate impression upon the bladder base unchanged. 7. Aortic atherosclerosis. Atherosclerotic coronary artery disease. Aortic Atherosclerosis (ICD10-I70.0) and Emphysema (ICD10-J43.9). Currently patient provider. Electronically Signed: By: Toribio Agreste M.D. On: 09/24/2024 14:04   CT Angio Chest Pulmonary Embolism (PE) W or WO Contrast Addendum Date: 09/24/2024 ADDENDUM REPORT: 09/24/2024 14:17 ADDENDUM: Above findings indicate  ischemic small bowel with perforation. Site of perforation unclear as may be related to the narrowed jejunal loop with twisting of the mesentery in the midline abdomen just right of midline as described. Critical Value/emergent results were called by telephone at the time of interpretation on 09/24/2024 at 2:15 pm to provider [facility] , who verbally acknowledged these results. Electronically Signed   By: Toribio Agreste M.D.   On: 09/24/2024 14:17   Result Date: 09/24/2024 CLINICAL DATA:  Shortness of breath. Abdominal pain. High probability of pulmonary embolism. EXAM: CT ANGIOGRAPHY CHEST CT ABDOMEN AND PELVIS WITH CONTRAST TECHNIQUE: Multidetector CT imaging of the chest was performed using the standard protocol during bolus administration of intravenous contrast. Multiplanar CT image reconstructions and MIPs were obtained to evaluate the vascular anatomy. Multidetector CT imaging of the abdomen and pelvis was performed using the standard protocol during bolus administration of intravenous contrast. RADIATION DOSE REDUCTION: This exam was performed according to the departmental dose-optimization program which includes automated exposure control, adjustment of the mA and/or kV according to patient size and/or use of iterative reconstruction technique. CONTRAST:  75mL OMNIPAQUE  IOHEXOL  350 MG/ML SOLN COMPARISON:  CT abdomen/pelvis 06/20/2024 FINDINGS: CTA CHEST FINDINGS Cardiovascular: Stable borderline cardiomegaly. Calcified plaque over the left anterior descending and right coronary arteries. Thoracic aorta is normal in caliber. Mild calcified plaque over the descending thoracic aorta. Pulmonary arterial system is adequately opacified without evidence of emboli. Remaining vascular structures are unremarkable. Mediastinum/Nodes: No mediastinal or hilar adenopathy. Mild-to-moderate dilatation of the esophagus with air-fluid level. Prominent fluid-filled distal esophagus without significant change from the  prior exam which may be due to reflux or dysmotility  versus hiatal hernia. Lungs/Pleura: Centrilobular emphysematous disease is present. Lungs are adequately inflated. There is mild bilateral posterior airspace density which may be due to atelectasis or infection. Small to moderate bilateral pleural effusions right worse than left. Subtle hazy patchy somewhat nodular airspace opacification over the right upper lobe with minimal opacification over the posterior dependent upper lobes. Findings may be due to infection or inflammatory process and less likely underlying neoplasm. Airways are normal. Musculoskeletal: No focal abnormality. Review of the MIP images confirms the above findings. CT ABDOMEN and PELVIS FINDINGS Hepatobiliary: Several liver cysts unchanged. Gallbladder and biliary tree are unremarkable. Pancreas: Normal. Spleen: Normal. Adrenals/Urinary Tract: Adrenal glands are normal. Kidneys are normal in size without hydronephrosis or nephrolithiasis. Bilateral renal cysts unchanged. Ureters are unremarkable. Bladder minimally distended with small focus of air along the nondependent portion likely due to recent instrumentation. Stomach/Bowel: Stomach distended with fluid and air. Dilatation of the third portion of the duodenum measuring 3.2 cm in diameter. There is also dilatation of the proximal jejunum measuring 3.4 cm. The dilated duodenum becomes significantly narrowed as it crosses the midline from left to right just below the level of the aortic bifurcation. Small bowel in this focal area appears to be associated with slight twisting of the mesentery. There are multiple mid to distal small bowel loops which are dilated and demonstrate diffuse pneumatosis. Distal ileal loop measures 3.6 cm in diameter. There is moderate fecal retention over the rectum. Stool and air-filled right colon, transverse colon and splenic flexure. There is evidence of small amount of free peritoneal air best seen over the right  peripelvic region anteriorly. Appendix not definitely visualized. Vascular/Lymphatic: Moderate calcified plaque over the abdominal aorta which is normal caliber. Celiac axis and superior mesenteric artery and branches appear to be patent. Inferior mesenteric artery not well visualized. Narrowed but patent left internal iliac artery. Portal vein and superior mesenteric vein are patent. No adenopathy. Reproductive: Moderate prostatic enlargement with moderate impression upon the bladder base unchanged. Other: Moderate free fluid over the pelvis. Musculoskeletal: No focal abnormality. Review of the MIP images confirms the above findings. IMPRESSION: 1. No evidence of pulmonary embolism. 2. Small to moderate bilateral pleural effusions right worse than left with associated bibasilar posterior airspace density which may be due to atelectasis or infection. Subtle hazy patchy somewhat nodular airspace opacification over the right upper lobe with minimal opacification over the posterior dependent upper lobes. Findings may be due to infection or inflammatory process and much less likely underlying neoplasm. Recommend follow-up CT 4-6 weeks. 3. Evidence of free peritoneal air with multiple mid to distal dilated small bowel loops demonstrating moderate diffuse pneumatosis. Findings are concerning for bowel ischemia. 4. Significant narrowing of the proximal jejunum as it crosses the midline from left to right just below the level of the aortic bifurcation. Small bowel in this focal area appears to be associated with slight twisting of the mesentery. Findings are concerning for small bowel obstruction due to volvulus or closed loop obstruction. 5. Moderate free fluid over the pelvis. 6. Moderate prostatic enlargement with moderate impression upon the bladder base unchanged. 7. Aortic atherosclerosis. Atherosclerotic coronary artery disease. Aortic Atherosclerosis (ICD10-I70.0) and Emphysema (ICD10-J43.9). Currently patient  provider. Electronically Signed: By: Toribio Agreste M.D. 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 Teresa - NGT with thin bilious fluid 1 L recorded for output yesterday - abdomen soft but unclear if any bowel function - clamping trials today and possibly remove NGT tomorrow if tolerating and low residuals - do not think pt needs TPN today but if unable to remove NGT and start PO or enteral feeds tomorrow would consider TPN  - ok to stop abx and monitor off since no abscess or perforation at time of OR - mobilize as tolerated  FEN: NPO, IVF per TR[facility], NGT clamping trials VTE: start SQH ID: Zosyn  11/16>11/18 Foley: present prior to surgery for retention    - per TR[facility] -  Hx of prostate CA Tobacco use HTN GERD Shingles with encephalitis Recent non-traumatic SAH VDRF Urinary retention with foley present    LOS: 2 days     Burnard JONELLE Louder, [facility] Surgery 09/26/2024, 9:41 AM Please see Amion for pager number during day hours 7:00am-4:30pm

## 2024-09-26 NOTE — Assessment & Plan Note (Signed)
 Hypokalemia and hypomagnesemia   Renal function today with serum cr at 0,61 with K at 3,3 and serum bicarbonate at 24  Na 134 Mg 1,8   Plan to add Kcl 40 meq IV and 2 g mag sulfate.  Continue close follow up renal functiin and electrolytes.  Continue IV fluids with normal saline and dextrose  at 75 ml per hr

## 2024-09-26 NOTE — Progress Notes (Signed)
 Initial Nutrition Assessment  DOCUMENTATION CODES:   Underweight, Severe malnutrition in context of chronic illness  INTERVENTION:   Pt needs nutrition support. +Severe malnutrition, chronic and ongoing, receiving TF via Cortrak prior to acute GI issue with surgery on 11/16. Recommend EN vs TPN pending GI function. Pt previously on EN via Cortrak prior to transfer back to acute care, surgery. Recommend Cortrak placement tomorrow if able to utilize GI track for at least trickles. If not recommend TPN. If post-op ileus likely, recommend starting TPN. Pt does not have central access which will be a barrier to starting TPN.   Discussed plan with Burnard Louder, PA with General Surgery. Plan for NG clamping trials today; Plan to assess GI function tomorrow, further decision regarding Cortrak with EN vs TPN (central access will need to be placed) at that time. Surgery does agree that pt needs to resume some form of nutrition support  If Cortrak placed and TF initiated, recommend: Goal: [employer] 1.4 (Standard) at 45 ml/hr with Pro-Source TF20 60 mL daily Recommend starting at trickle rate and slowly advancing to goal TF provides 87 g of protein, 1592 kcals and 777 mL of free water   NUTRITION DIAGNOSIS:   Severe Malnutrition related to chronic illness as evidenced by severe muscle depletion, severe fat depletion.  GOAL:   Patient will meet greater than or equal to 90% of their needs  MONITOR:   Diet advancement, Labs, Weight trends, Skin, I & O's  REASON FOR ASSESSMENT:   Consult Assessment of nutrition requirement/status  ASSESSMENT:   83 yo male admitted from CIR on 11/16 for acute respiratory failure with abdominal distention. Stat CT obtained and concerning for SB pneumatosis, ischemic bowel with free air. Pt take emergently to OR for ex lap requiring reduction and repair of internal hernia. 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 robotic  gastrojejunostomy on 07/03/24 (required TPN), shingles with encephalitis, tobacco use, HTN, GERD, hx of prostate cancer, deafness (left ear), full dentures  Previous Admission (09/08/24): 10/31 Admitted for FTT, initial discussion re: starting TPN by MD (but TPN never started) 11/07 Cortrak placed, EN initiated 11/13 Admitted to CIR, continued TF via Cortrak  This Admission: 11/16 Admitted from CIR, NPO, OR: ex lap with reduction and repair of internal hernia 2/2 diffuse pneumatosis intestinalis, internal hernia containing SB 11/17 Extubated 11/18 NG Clamping trials initiated  Pt groggy, sleepy on visit today. Open eyes to voice but falls back asleep easily.   NPO, NG to LIS. Plan for clamping trials today per Surgery Cortrak no longer in place Per surgery, plan for NG clamping trials today  Current wt 48.9 kg; overall weight has trended down with some fluctuations up and down. Pt is underweight  Noted pt previously on Osmolite TF via Cortrak plus po diet but not eating much. Pt not wanting to drink anything with dairy as he reports it affects his BMs,[employer] was ordered by mouth. Pt received thiamine supplementation x 7 days starting on 11/16  Noted pt previously on salt tabs for hyponatremia secondary to SIADH, being followed by nephrology for new onset AKI; salt tabs stopped on 11/14. Salt tabs not currently ordered; noted sodium 134, BUN/Creatinine wdl.   Labs: Sodium 134 (L) Potassium 3.3 (L) BUN/Creatinine wdl Phosphorus 4.3 (wdl) Magnesium 1.8 (wdl)  Meds: Colace BID and prn KCl IV Zosyn  Mag Sulfate  NUTRITION - FOCUSED PHYSICAL EXAM:  Flowsheet Row Most Recent Value  Orbital Region Severe depletion  Upper Arm  Region Severe depletion  Thoracic and Lumbar Region Severe depletion  Buccal Region Severe depletion  Temple Region Severe depletion  Clavicle Bone Region Severe depletion  Clavicle and Acromion Bone Region Severe depletion  Scapular Bone Region Severe  depletion  Dorsal Hand Moderate depletion  Patellar Region Severe depletion  Anterior Thigh Region Severe depletion  Posterior Calf Region Severe depletion  Edema (RD Assessment) None    Diet Order:   Diet Order     None       EDUCATION NEEDS:   Not appropriate for education at this time  Skin:  Skin Assessment: Skin Integrity Issues: Skin Integrity Issues:: Stage I, Incisions Stage I: sacrum Incisions: abdomen (closed)  Last BM:  11/16  Height:   Ht Readings from Last 1 Encounters:  09/24/24 5' 7.01 (1.702 m)    Weight:   Wt Readings from Last 1 Encounters:  09/26/24 48.9 kg     BMI:  Body mass index is 16.88 kg/m.  Estimated Nutritional Needs:   Kcal:  1500-1700 kcals  Protein:  75-90 g  Fluid:  >/= 1.5 L    Betsey Finger MS, RDN, LDN, CNSC Registered Dietitian 3 Clinical Nutrition RD Inpatient Contact Info in Amion

## 2024-09-26 NOTE — PMR Pre-admission (Shared)
 PMR Admission Coordinator Pre-Admission Assessment Patient: Noah Burnett is an 83 y.o., male MRN: [id number] DOB: 11-05-1941 Height: 5' 7 (170.2 cm) Weight: 49.1 kg   Insurance Information HMO: yes    PPO: yes     PCP:      IPA:      80/20:      OTHER:  PRIMARY: [employer] Medicare HMO/PPO      Policy#:  [id number]     Subscriber: Pt CM Name:     Phone#: [phone] Fax: [phone] Pre-Cert#: [id number]    Employer:  Benefits:  Phone #:      Name:  Eustacio. Date: 11/09/20     Deduct: 0      Out of Pocket Max: 5,900 [phone] met)      Life Max: n/a CIR: $300/day for days 1-6      SNF: $10/day Outpatient: $30/ visit      Home Health: $0  DME: 20%      Providers: in network SECONDARY:       Policy#:      Phone#:    Artist:       Phone#:    The Data Processing Manager" for patients in Inpatient Rehabilitation Facilities with attached "Privacy Act Statement-Health Care Records" was provided and verbally reviewed with: Pt.  The "Data Collection Information Summary" for patients in Inpatient Rehabilitation Facilities with attached "Privacy Act Statement-Health Care Records" was provided and verbally reviewed with: Patient and Family  Emergency Contact Information Contact Information     Name Relation Home Work Monette Daughter [phone]        Other Contacts     Name Relation Home Work [place] Son   [phone]       Current Medical History  Patient Admitting Diagnosis: Encephalopathy History of Present Illness: Noah Burnett is a 83 year old right-handed male with history significant for hypertension, prediabetes, GERD, quit smoking 25 years ago, 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  He had an EGD that showed hiatal hernia and compression of the second portion  of duodenum with possible intraluminal neoplasm.  Biopsies were obtained which showed severely inflamed and ulcerated duodenal mucosa, but no evidence of malignancy.  A second EGD was done with ultrasound guidance but again pathology of the lesion extensive necrosis.  Surgical oncology consulted underwent robotic gastrojejunostomy on 8/25.  He did have delayed return of bowel function and a PICC line with TPN for several days until he had advancement of his diet.  Since his procedure he had had fairly steady decline with decreasing appetite significant weight loss and inability to walk.  He also had follow-up surgical oncology.  He had repeat MRI which did not show the duodenal mass.  Per chart review patient lives alone.  1 level home one-step to entry.  He has required assist from his family for ADLs and use of a rolling walker for mobility the past 2 weeks and has experienced 1 fall in the past 2 weeks.  Presented 09/08/2024 to [facility] with altered mental status and unwitnessed fall as well as reports of generalized weakness and fatigue.  In the ED abdominal x-ray showed no evidence of obstruction.  Cranial CT scan negative for acute changes.  MRI showed multifocal subcortical contrast-enhancement, greatest at the right parietal-occipital fissure but present at multiple subcortical  locations in both hemispheres.  Small areas of diffusion abnormality at the anterior left insula, left frontal operculum, right frontal operculum, and in multiple locations of both corona radiata and some lesions appearing more acute than others based on lower ADC values.  There was multiple possibilities raised by above findings considerations infectious versus inflammatory encephalitis versus vasculitis in CSF.  Patient was transferred to [facility] for further evaluation.  Chemistries unremarkable except sodium 128 potassium 3.4 chloride 90 AST 14, total CK of 30, TSH 4.632, a.m. cortisol 30, serum osmolality  257, urine sodium 137, urine osmolality 490 urinalysis negative for pyuria.  Neurology was consulted for evaluation of acute encephalopathy as well as nephrology consulted for hyponatremia.  Patient underwent fluoroscopic guided lumbar puncture and fluid sent for analysis for evaluation of meningitis encephalitis.  Procalcitonin negative, CRP 4.1.  EEG showed diffuse cerebral dysfunction no seizure seen throughout the recording.  VZV PCR was positive, varicella IgG negative.  On the morning of 09/13/2024 patient noted to have left gaze preference and decreased responsiveness code stroke was called patient was seen by neurology services.  Stat CT was done which showed left superior convexity subarachnoid hemorrhage without mass effect.  Otherwise no changes from previous CT.  CT angiogram ruled out large vessel occlusion.  Patient was initially kept n.p.o. follow-up speech therapy.  Eventually MRI completed showed prominent patchy cortical and subcortical signal abnormality throughout both hemispheres overall slight progressed from 09/09/2024 and intermediate although encephalitis and vasculitis remain leading considerations.  ID recommended continuing acyclovir .  Patient's mental status continued to improve.  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  Patient was also started on high-dose methylprednisolone  by neurology 09/14/2024 with the plan to continue for 5 days followed by taper of prednisone . 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  The sodium level had actually worsened with normal saline and since IV fluid was required during acyclovir   treatment reduce to 25 cc an hour and received a dose of Lasix  09/19/2024 as well as 09/20/2024. Bouts of urinary retention on the evening of 09/13/2024 patient was retaining urine bladder scan for 900 cc orders given for nurse for straight caths however that was unsuccessful so coud catheter was placed and started on Flomax . AKI creatinine jumped from 0.76-1 0.66-2.07 09/14/2024 nephrology had been following at that time given IV fluids AKI presumed to be secondary to obstructive uropathy as well as contrast-induced.  AKI resolved 09/15/2024.  Patient also with bouts of hypophosphatemia supplemented with follow-up chemistries Patient was cleared to begin Lovenox  for DVT prophylaxis. His diet has been advanced to mechanical soft initially requiring tube feeds for nutritional support.  Palliative care was consulted to establish goals of care.  Patient did have some persistent nausea and vomiting with KUB 09/17/2024 showing ileus.  He was having no tenderness with regular bowel movements reported. He admitted to [facility] 09/21/24 but transferred off 09/22/24 for bowel perforation. On 11/16 patient was taken to the OR for ischemic bowel with perforation and underwent ex lap with reduction and repair of internal hernia with postop findings of internal hernia containing small bowel, and diffuse pneumatosis intestinalis. He was extubated until 09/25/24 and PT/OT saw post  op and recommended return to PLOF.    Patient's medical record from [facility] has been reviewed by the rehabilitation admission coordinator and physician.  Past Medical History  Past Medical History:  Diagnosis Date   Arthritis    Bladder tumor    Deafness, left    Full dentures    GERD (gastroesophageal reflux disease)    History of [place] spotted fever    01-25-2020  per pt had in 2018 and was treated w/ no residual   Hyperplasia of prostate with lower urinary tract symptoms (LUTS)    Hypertension    followed by  pcp   (01-25-2020  per pt had stress test approx. 2005, told normal , done in [place] somewhere)   Pre-diabetes    followed by pcp ,   watches diet   Prostate cancer [facility]) urologist--- dr watt   dx by bx01-13-2020,  Gleason 3+4 ;  MRI 12-19-2018, vol 96.26;   active survillance   Shingles    Wears glasses    Wears hearing aid in right ear     Has the patient had major surgery during 100 days prior to admission? {Yes/No/Unknown:304600602}  Family History   family history is not on file.  Current Medications  Current Facility-Administered Medications:    alum & mag hydroxide-simeth (MAALOX/MYLANTA) 200-200-20 MG/5ML suspension 30 mL, 30 mL, Oral, Q4H PRN, Rashid, Farhan, MD   benzonatate  (TESSALON ) capsule 100 mg, 100 mg, Oral, TID PRN, Rashid, Farhan, MD   Chlorhexidine  Gluconate Cloth 2 % PADS 6 each, 6 each, Topical, Daily, Teresa Lonni HERO, MD, 6 each at 10/02/24 1058   docusate sodium  (COLACE) capsule 100 mg, 100 mg, Oral, BID PRN, Sharie Bourbon, MD, 100 mg at 09/28/24 2211   docusate sodium  (COLACE) capsule 100 mg, 100 mg, Oral, BID, Laron Agent, RPH, 100 mg at 09/30/24 1039   feeding supplement (KATE FARMS STANDARD 1.4) liquid 325 mL, 325 mL, Oral, BID BM, Rashid, Farhan, MD, 325 mL at 10/01/24 1434   fentaNYL  (SUBLIMAZE ) injection 12.5-50 mcg, 12.5-50 mcg, Intravenous, Q2H PRN, Bowser, Grace E, NP, 25 mcg at 09/29/24 1029   heparin  injection 5,000 Units, 5,000 Units, Subcutaneous, Q8H, Ezana, Hubbert, PA-C, 5,000 Units at 10/02/24 9485   lidocaine  (LIDODERM ) 5 % 1 patch, 1 patch, Transdermal, Q24H, Jashad, Depaula, PA-C, 1 patch at 10/02/24 1057   methocarbamol  (ROBAXIN ) injection 500 mg, 500 mg, Intravenous, Q6H PRN, Vicci Burnard SAUNDERS, PA-C   multivitamin with minerals tablet 1 tablet, 1 tablet, Oral, Daily, Rashid, Farhan, MD, 1 tablet at 10/02/24 1057   Oral care mouth rinse, 15 mL, Mouth Rinse, PRN, Teresa Lonni HERO, MD   pantoprazole  (PROTONIX ) EC tablet  40 mg, 40 mg, Oral, BID, Rashid, Farhan, MD   potassium chloride  (KLOR-CON ) packet 40 mEq, 40 mEq, Oral, Once, Rashid, Farhan, MD  Patients Current Diet:  Diet Order             DIET SOFT Room service appropriate? Yes; Fluid consistency: Thin  Diet effective now                    Precautions / Restrictions Precautions Precautions: Fall Restrictions Weight Bearing Restrictions Per Provider Order: No    Has the patient had 2 or more falls or a fall with injury in the past year? Yes   Prior Activity Level Community (5-7x/wk): Pt. active in the community PTA   Prior Functional Level Self Care: Did the patient need help bathing,  dressing, using the toilet or eating? Independent   Indoor Mobility: Did the patient need assistance with walking from room to room (with or without device)? Independent   Stairs: Did the patient need assistance with internal or external stairs (with or without device)? Independent   Functional Cognition: Did the patient need help planning regular tasks such as shopping or remembering to take medications? Independent   Patient Information Are you of Hispanic, Latino/a,or Spanish origin?: A. No, not of Hispanic, Latino/a, or Spanish origin What is your race?: A. White Do you need or want an interpreter to communicate with a doctor or health care staff?: 0. No Patient information obtained via proxy : yes   Patient's Response To:  Health Literacy and Transportation Is the patient able to respond to health literacy and transportation needs?: No Health Literacy - How often do you need to have someone help you when you read instructions, pamphlets, or other written material from your doctor or pharmacy?: Patient unable to respond In the past 12 months, has lack of transportation kept you from medical appointments or from getting medications?: No In the past 12 months, has lack of transportation kept you from meetings, work, or from getting things needed  for daily living?: No Higher Education Careers Adviser obtained via proxy: yes   Journalist, Newspaper / Equipment Home Equipment: Shower seat, Agricultural Consultant (2 wheels), Wheelchair - manual   Prior Device Use: Indicate devices/aids used by the patient prior to current illness, exacerbation or injury? Walker    Current Functional Level Cognition  Orientation Level: Oriented X4    Extremity Assessment (includes Sensation/Coordination)  Upper Extremity Assessment: Right hand dominant, Generalized weakness, RUE deficits/detail, LUE deficits/detail RUE Deficits / Details: generalized weakness; decrease grasp (R>L); decreased fine motor coordination; noted mildly edematous in hand and wrist RUE Coordination: decreased fine motor LUE Deficits / Details: generalized weakness; decrease grasp (R>L); decreased fine motor coordination  Lower Extremity Assessment: Defer to PT evaluation    ADLs  Overall ADL's : Needs assistance/impaired Eating/Feeding: Set up, Sitting (with use of built-up foam grips for utensils) Eating/Feeding Details (indicate cue type and reason): Pt eating lunch upon OT arrival with pt self-feeding using non-dominant Left hand due to decreased grasp/coordination in dominant R hand. Pt with significant spillage and difficulty feeding self with both hands without a built up grip. OT provided and trialled use of foam for built-up grip with pt demonstrating ability to self-feed with dominant R hand with limited spillage with use of built-up grip and pr reporting increased comfort and ease of use Grooming: Wash/dry hands, Minimal assistance, Standing Grooming Details (indicate cue type and reason): Min A to complete grooming tasks standing at sink. Pt with left and posterior sway in standing requiring increased multimodal cues for stability. Pt with forward propping on counter to assist in maintaining standing balance. Upper Body Bathing: Minimal assistance Lower Body Bathing:  Moderate assistance Upper Body Dressing : Minimal assistance, Sitting Upper Body Dressing Details (indicate cue type and reason): gown for back Lower Body Dressing: Moderate assistance Toilet Transfer: Minimal assistance, Cueing for safety, Ambulation, Regular Toilet, Rolling walker (2 wheels) Toilet Transfer Details (indicate cue type and reason): Min A for ambualtion to commode in bathroom. Verbal cues for safety with management of RW and awareness of toilet prior to sitting. Toileting- Clothing Manipulation and Hygiene: Moderate assistance Toileting - Clothing Manipulation Details (indicate cue type and reason): Pt able to complete some posterior peri care, required completion of hygeine and clothing management  in standing. Functional mobility during ADLs: Minimal assistance, Rolling walker (2 wheels)    Mobility  Overal bed mobility: Needs Assistance (Simultaneous filing. User may not have seen previous data.) Bed Mobility: Sidelying to Sit Sidelying to sit: Min assist Supine to sit: Supervision, HOB elevated Sit to supine: Min assist General bed mobility comments: min A to elevate trunk from sidelying>sit (Simultaneous filing. User may not have seen previous data.)    Transfers  Overall transfer level: Needs assistance (Simultaneous filing. User may not have seen previous data.) Equipment used: Rolling walker (2 wheels) (Simultaneous filing. User may not have seen previous data.) Transfers: Sit to/from Stand, Bed to chair/wheelchair/BSC (Simultaneous filing. User may not have seen previous data.) Sit to Stand: Min assist (Simultaneous filing. User may not have seen previous data.) Bed to/from chair/wheelchair/BSC transfer type:: Step pivot Step pivot transfers: Contact guard assist General transfer comment: min A to steady on rise (Simultaneous filing. User may not have seen previous data.)    Ambulation / Gait / Stairs / Wheelchair Mobility  Ambulation/Gait Ambulation/Gait  assistance: Contact guard assist Gait Distance (Feet): 55 Feet Assistive device: Rolling walker (2 wheels) Gait Pattern/deviations: Step-through pattern, Decreased stride length, Trunk flexed, Narrow base of support General Gait Details: pt with low shuffling steps with flexed trunk, CGA for safety, pt limited due to pain. Gait velocity: decreased Gait velocity interpretation: <1.8 ft/sec, indicate of risk for recurrent falls    Posture / Balance Dynamic Sitting Balance Sitting balance - Comments: flexed posture 2/2 abd pain (Simultaneous filing. User may not have seen previous data.) Balance Overall balance assessment: Needs assistance (Simultaneous filing. User may not have seen previous data.) Sitting-balance support: No upper extremity supported, Feet supported (Simultaneous filing. User may not have seen previous data.) Sitting balance-Leahy Scale: Fair (Simultaneous filing. User may not have seen previous data.) Sitting balance - Comments: flexed posture 2/2 abd pain (Simultaneous filing. User may not have seen previous data.) Postural control: Other (comment) (anterior lean) Standing balance support: Bilateral upper extremity supported, During functional activity, Reliant on assistive device for balance (Simultaneous filing. User may not have seen previous data.) Standing balance-Leahy Scale: Poor (Simultaneous filing. User may not have seen previous data.) Standing balance comment: no overt LOB, realiant on RW support (Simultaneous filing. User may not have seen previous data.)    Special considerations/life events  Skin *** and Special service needs ***   Previous Home Environment (from acute therapy documentation) Living Arrangements: Alone  Lives With: Alone Available Help at Discharge: Family Type of Home: House Home Layout: One level Home Access: Stairs to enter Entrance Stairs-Rails: None Entrance Stairs-Number of Steps: 1 Bathroom Shower/Tub: Therapist, Art: Handicapped height Bathroom Accessibility: Yes How Accessible: Accessible via walker, Accessible via wheelchair  Discharge Living Setting    Social/Family/Support Systems    Goals    Decrease burden of Care through IP rehab admission: Not anticipated  Possible need for SNF placement upon discharge: not anticipated  Patient Condition: I have reviewed medical records from [facility], spoken with CM, and patient and daughter. I met with patient at the bedside for inpatient rehabilitation assessment.  Patient will benefit from ongoing PT, OT, and SLP, can actively participate in 3 hours of therapy a day 5 days of the week, and can make measurable gains during the admission.  Patient will also benefit from the coordinated team approach during an Inpatient Acute Rehabilitation admission.  The patient will receive intensive therapy as well as Rehabilitation physician,  nursing, child psychotherapist, and care management interventions.  Due to bladder management, bowel management, safety, skin/wound care, disease management, medication administration, pain management, and patient education the patient requires 24 hour a day rehabilitation nursing.  The patient is currently *** with mobility and basic ADLs.  Discharge setting and therapy post discharge at home with home health is anticipated.  Patient has agreed to participate in the Acute Inpatient Rehabilitation Program and will admit {Time; today/tomorrow:10263}.  Preadmission Screen Completed By:  Leita KATHEE Kleine, 10/02/2024 3:19 PM ______________________________________________________________________   Discussed status with Dr. PIERRETTE on *** at *** and received approval for admission today.  Admission Coordinator:  Leita KATHEE Kleine, CCC-SLP, time PIERRETTEPattricia ***   Assessment/Plan: Diagnosis: *** Does the need for close, 24 hr/day Medical supervision in concert with the patient's rehab needs make it unreasonable for this patient to be  served in a less intensive setting? {yes_no_potentially:3041433} Co-Morbidities requiring supervision/potential complications: *** Due to {due un:6958565}, does the patient require 24 hr/day rehab nursing? {yes_no_potentially:3041433} Does the patient require coordinated care of a physician, rehab nurse, PT, OT, and SLP to address physical and functional deficits in the context of the above medical diagnosis(es)? {yes_no_potentially:3041433} Addressing deficits in the following areas: {deficits:3041436} Can the patient actively participate in an intensive therapy program of at least 3 hrs of therapy 5 days a week? {yes_no_potentially:3041433} The potential for patient to make measurable gains while on inpatient rehab is {potential:3041437} Anticipated functional outcomes upon discharge from inpatient rehab: {functional outcomes:304600100} PT, {functional outcomes:304600100} OT, {functional outcomes:304600100} SLP Estimated rehab length of stay to reach the above functional goals is: *** Anticipated discharge destination: {anticipated dc setting:21604} 10. Overall Rehab/Functional Prognosis: {potential:3041437}   MD Signature: ***

## 2024-09-26 NOTE — Hospital Course (Signed)
 83 year old male with history of prostate CVA, tobacco use, HTN, GERD, duodenal mass involving the pancreatic head s/p robotic gastrojejunostomy on 07/03/2024, with declining healthcare status including recent hospitalization for shingles and encephalitis.    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  Abdominal distention workup showed finding concerning for bowel ischemia in the setting of a prior gastrojejunostomy.   Imaging showed evidence of free peritoneal air with multiple mid to distal dilated small bowel with moderate diffuse pneumatosis.  Otherwise there is no PE, moderate bilateral pleural effusion was appreciated. On 11/16 patient was taken to the OR for ischemic bowel with perforation and underwent ex lap with reduction and repair of internal hernia with postop findings of internal hernia containing small bowel, and diffuse pneumatosis intestinalis  11/16 admitted to the ICU while on invasive mechanical ventilation.  Noted aspiration pneumonia on right upper lung.  11/17 patient extubated to nasal cannula.  11/18 transferred to [facility].

## 2024-09-26 NOTE — Assessment & Plan Note (Signed)
 Continue blood pressure monitoring.  Holding on antihypertensive agents for now due to risk of hypotension

## 2024-09-26 NOTE — Progress Notes (Signed)
 Hypoglycemic Event  CBG: 66  Treatment: D50 25 mL (12.5 gm)  Symptoms: None  Follow-up CBG: Time:1231 CBG Result:97  Possible Reasons for Event: Inadequate meal intake    Noah Burnett

## 2024-09-26 NOTE — Evaluation (Signed)
 Physical Therapy Evaluation Patient Details Name: Noah Burnett MRN: [id number] DOB: Nov 25, 1940 Today's Date: 09/26/2024  History of Present Illness  83 y/o M presenting from CIR on 11/16 with acute respiratory hypoxia and abdominal distention, found to have ischemia bowel perforation s/p ex lap with reduction and repair of internal hernia on 09/24/2024    PMH includes duodenal mass s/p robotic gastrojejunostomy, nontraumatic SAH  Clinical Impression  Pt admitted with above complications, evaluated post-op. Requires min assist for transfers from recliner and min assist for RW control with short distance ambulation in room, reliant on RW for support. Distance limited by fatigue and abdominal pain today but was eager to mobilize as tolerated. Patient will benefit from intensive inpatient follow-up therapy, >3 hours/day.  Pt currently with functional limitations due to the deficits listed below (see PT Problem List). Pt will benefit from acute skilled PT to increase their independence and safety with mobility to allow discharge.           If plan is discharge home, recommend the following: Assistance with cooking/housework;Direct supervision/assist for medications management;Direct supervision/assist for financial management;Assist for transportation;Help with stairs or ramp for entrance;A lot of help with walking and/or transfers;A lot of help with bathing/dressing/bathroom   Can travel by private vehicle        Equipment Recommendations None recommended by PT  Recommendations for Other Services  Rehab consult    Functional Status Assessment Patient has had a recent decline in their functional status and demonstrates the ability to make significant improvements in function in a reasonable and predictable amount of time.     Precautions / Restrictions Precautions Precautions: Fall Recall of Precautions/Restrictions: Impaired Precaution/Restrictions Comments: NGT Restrictions Weight  Bearing Restrictions Per Provider Order: No      Mobility  Bed Mobility Overal bed mobility: Needs Assistance Bed Mobility: Sit to Supine       Sit to supine: Mod assist   General bed mobility comments: Mod assist for LE and trunk support back into bed, somewhat impulsive to lie back despite cues for sidelying and rail use for comfort.    Transfers Overall transfer level: Needs assistance Equipment used: Rolling walker (2 wheels) Transfers: Sit to/from Stand Sit to Stand: Min assist           General transfer comment: Min assist for boost with cues for hand placement and set-up on edge of recline prior to rising. Stablized with RW for support.    Ambulation/Gait Ambulation/Gait assistance: Min assist Gait Distance (Feet): 15 Feet Assistive device: Rolling walker (2 wheels) Gait Pattern/deviations: Step-through pattern, Decreased stride length, Trunk flexed, Narrow base of support Gait velocity: decreased Gait velocity interpretation: <1.31 ft/sec, indicative of household ambulator   General Gait Details: Min assist for RW control and light balance with backing up. Able to walk around end of bed but fatigued rapidly, and declines further distance today. VSS throughout though on 3L supplemental O2.  Stairs            Wheelchair Mobility     Tilt Bed    Modified Rankin (Stroke Patients Only)       Balance Overall balance assessment: Needs assistance Sitting-balance support: Feet supported Sitting balance-Leahy Scale: Fair     Standing balance support: Reliant on assistive device for balance, During functional activity, Single extremity supported Standing balance-Leahy Scale: Poor Standing balance comment: reliant on external support  Pertinent Vitals/Pain Pain Assessment Pain Assessment: Faces Faces Pain Scale: Hurts even more Pain Location: abdomen Pain Descriptors / Indicators: Discomfort, Grimacing Pain  Intervention(s): Limited activity within patient's tolerance, Monitored during session, Repositioned    Home Living Family/patient expects to be discharged to:: Inpatient rehab Living Arrangements: Alone Available Help at Discharge: Family Type of Home: House Home Access: Stairs to enter Entrance Stairs-Rails: None Entrance Stairs-Number of Steps: 1   Home Layout: One level Home Equipment: Pharmacist, Hospital (2 wheels);Wheelchair - manual      Prior Function Prior Level of Function : Needs assist;Driving;History of Falls (last six months)             Mobility Comments: min A wtih RW per CIR notes ADLs Comments: ind with ADLs prior to admission, reports he was driving     Extremity/Trunk Assessment   Upper Extremity Assessment Upper Extremity Assessment: Defer to OT evaluation    Lower Extremity Assessment Lower Extremity Assessment: Generalized weakness    Cervical / Trunk Assessment Cervical / Trunk Assessment: Kyphotic  Communication   Communication Communication: Impaired Factors Affecting Communication: Hearing impaired    Cognition Arousal: Alert Behavior During Therapy: Flat affect   PT - Cognitive impairments: Difficult to assess Difficult to assess due to: Impaired communication                     PT - Cognition Comments: Limited verbalizations with pt occasionally nodding yes/no. Mainly incomprehensible speech with pt mumbling. Following commands: Impaired Following commands impaired: Follows one step commands with increased time, Follows one step commands inconsistently     Cueing Cueing Techniques: Verbal cues, Tactile cues, Visual cues     General Comments General comments (skin integrity, edema, etc.): HR 76, SPO2 100% on 3L, BP 132/75    Exercises General Exercises - Lower Extremity Ankle Circles/Pumps: AROM, 5 reps, 20 reps, Seated   Assessment/Plan    PT Assessment Patient needs continued PT services  PT Problem List  Decreased strength;Decreased balance;Decreased activity tolerance;Decreased mobility;Decreased cognition;Decreased knowledge of use of DME;Decreased safety awareness;Decreased range of motion;Decreased knowledge of precautions;Cardiopulmonary status limiting activity;Pain       PT Treatment Interventions DME instruction;Gait training;Functional mobility training;Therapeutic activities;Therapeutic exercise;Balance training;Neuromuscular re-education;Patient/family education;Modalities    PT Goals (Current goals can be found in the Care Plan section)  Acute Rehab PT Goals Patient Stated Goal: Back to rehab and finish goals there PT Goal Formulation: With patient Time For Goal Achievement: 10/10/24 Potential to Achieve Goals: Good    Frequency Min 3X/week     Co-evaluation               AM-PAC PT 6 Clicks Mobility  Outcome Measure Help needed turning from your back to your side while in a flat bed without using bedrails?: A Little Help needed moving from lying on your back to sitting on the side of a flat bed without using bedrails?: A Lot Help needed moving to and from a bed to a chair (including a wheelchair)?: A Little Help needed standing up from a chair using your arms (e.g., wheelchair or bedside chair)?: A Little Help needed to walk in hospital room?: A Little Help needed climbing 3-5 steps with a railing? : A Lot 6 Click Score: 16    End of Session   Activity Tolerance: Patient tolerated treatment well Patient left: with call bell/phone within reach;with family/visitor present;in bed;with bed alarm set Nurse Communication: Mobility status PT Visit Diagnosis: Unsteadiness on feet (R26.81);Other abnormalities of  gait and mobility (R26.89);Muscle weakness (generalized) (M62.81);History of falling (Z91.81);Difficulty in walking, not elsewhere classified (R26.2);Pain Pain - part of body:  (abdomen)    Time: 8886-8866 PT Time Calculation (min) (ACUTE ONLY): 20  min   Charges:   PT Evaluation $PT Eval Moderate Complexity: 1 Mod   PT General Charges $$ ACUTE PT VISIT: 1 Visit         Leontine Roads, PT, DPT [facility]  Rehabilitation Services Physical Therapist Office: [phone] Website: [email or web address]   Leontine GORMAN Roads 09/26/2024, 5:13 PM

## 2024-09-26 NOTE — Progress Notes (Signed)
 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  Abdominal distention workup showed finding concerning for bowel ischemia in the setting of a prior gastrojejunostomy.   Imaging showed evidence of free peritoneal air with multiple mid to distal dilated small bowel with moderate diffuse pneumatosis.  Otherwise there is no PE, moderate bilateral pleural effusion was appreciated. On 11/16 patient was taken to the OR for ischemic bowel with perforation and underwent ex lap with reduction and repair of internal hernia with postop findings of internal hernia containing small bowel, and diffuse pneumatosis intestinalis  11/16 admitted to the ICU while on invasive mechanical ventilation.  Noted aspiration pneumonia on right upper lung.  11/17 patient extubated to nasal cannula.  11/18 transferred to [facility].   Assessment and Plan: * SBO (small bowel obstruction) (HCC) 11/16 Sp exploratory laparotomy with reduction and repair of internal hernia.   Post operative ileus Plan to clamp NG tube today.  Possible advance diet depending on bowel function.    Right upper lobe pneumonia Post operative respiratory failure, now resolved.  02 saturation 99% on 3 L/min per [place]   Right upper lobe aspiration pneumonia  11/16 follow up chest radiograph with no frank  infiltrate.  Will plan to complete antibiotic therapy for 5 days.  Continue to mobilize, PT and OT.   Essential hypertension Continue blood pressure monitoring.  Holding on antihypertensive agents for now due to risk of hypotension   Hyponatremia Hypokalemia and hypomagnesemia   Renal function today with serum cr at 0,61 with K at 3,3 and serum bicarbonate at 24  Na 134 Mg 1,8   Plan to add Kcl 40 meq IV and 2 g mag sulfate.  Continue close follow up renal functiin and electrolytes.  Continue IV fluids with normal saline and dextrose  at 75 ml per hr   Subarachnoid hemorrhage, nontraumatic (HCC) Continue pt and ot   Pressure injury of skin Continue local skin care  Protein-calorie malnutrition, severe Continue with nutritional supplements.         Subjective: Patient very weak and deconditioned, no chest pain and no dyspnea. Continue with NG tube in place at the time of my examination his to suction   Physical Exam: Vitals:   09/26/24 1000 09/26/24 1100 09/26/24 1200 09/26/24 1209  BP: 125/76 134/76 136/79   Pulse: 84 86 99 73  Resp: 20 19 20 18   Temp:    98 F (36.7 C)  TempSrc:    Oral  SpO2: 98% 99% 98% 98%  Weight:      Height:       Neurology awake and alert, deconditioned and ill looking appearing ENT with mild pallor Cardiovascular with S1 and S2 present and regular with no gallops, rubs or murmurs Respiratory with no rales or wheezing, poor inspiratory effort on anterior auscultation  Abdomen with mild distention, non tender, bowel sounds positive  No lower extremity edema   Data Reviewed:  Family Communication: I spoke with patient's nice  at the bedside, we talked in detail about patient's condition, plan of care and prognosis and all questions were addressed.   Disposition: Status is: Inpatient Remains inpatient appropriate because: recovering SBO   Planned Discharge Destination: Skilled nursing facility    Author: Elidia Toribio Furnace,  MD 09/26/2024 3:21 PM  For on call review [email or web address].

## 2024-09-27 DIAGNOSIS — K56609 Unspecified intestinal obstruction, unspecified as to partial versus complete obstruction: Secondary | ICD-10-CM | POA: Diagnosis not present

## 2024-09-27 LAB — GLUCOSE, CAPILLARY
Glucose-Capillary: 105 mg/dL — ABNORMAL HIGH (ref 70–99)
Glucose-Capillary: 109 mg/dL — ABNORMAL HIGH (ref 70–99)
Glucose-Capillary: 132 mg/dL — ABNORMAL HIGH (ref 70–99)
Glucose-Capillary: 99 mg/dL (ref 70–99)

## 2024-09-27 LAB — BASIC METABOLIC PANEL WITH GFR
Anion gap: 13 (ref 5–15)
BUN: 18 mg/dL (ref 8–23)
CO2: 24 mmol/L (ref 22–32)
Calcium: 7.4 mg/dL — ABNORMAL LOW (ref 8.9–10.3)
Chloride: 98 mmol/L (ref 98–111)
Creatinine, Ser: 0.76 mg/dL (ref 0.61–1.24)
GFR, Estimated: >60 mL/min (ref >60)
Glucose, Bld: 102 mg/dL — ABNORMAL HIGH (ref 70–99)
Potassium: 3.1 mmol/L — ABNORMAL LOW (ref 3.5–5.1)
Sodium: 135 mmol/L (ref 135–145)

## 2024-09-27 LAB — MAGNESIUM: Magnesium: 2 mg/dL (ref 1.7–2.4)

## 2024-09-27 MED ORDER — POTASSIUM CHLORIDE 10 MEQ/100ML IV SOLN
10.0000 meq | INTRAVENOUS | Status: AC
  Administered 2024-09-27 (×2): 10 meq via INTRAVENOUS
  Filled 2024-09-27 (×2): qty 100

## 2024-09-27 NOTE — Care Management Obs Status (Signed)
 MEDICARE OBSERVATION STATUS NOTIFICATION   Patient Details  Name: Noah Burnett MRN: [id number] Date of Birth: 10-22-1941   Medicare Observation Status Notification Given:       Claretta Deed 09/27/2024, 4:17 PM

## 2024-09-27 NOTE — Progress Notes (Addendum)
 Progress Note  3 Days Post-Op  Subjective: No complaints.  Passing gas.  No n/v.    Objective: Vital signs in last 24 hours: Temp:  [97.3 F (36.3 C)-98.4 F (36.9 C)] 97.3 F (36.3 C) (11/19 0806) Pulse Rate:  [65-99] 72 (11/19 0806) Resp:  [15-21] 16 (11/19 0806) BP: (119-150)/(58-86) 139/76 (11/19 0806) SpO2:  [98 %-100 %] 100 % (11/19 0806) Weight:  [47.1 kg] 47.1 kg (11/19 0500) Last BM Date :  (PTA)  Intake/Output from previous day: 11/18 0701 - 11/19 0700 In: 250 [NG/GT:250] Out: 1350 [Urine:800; Emesis/NG output:550] Intake/Output this shift: No intake/output data recorded.  PE: General: WD, chronically ill appearing elderly male, NAD. Better mental status.  Hard of hearing.  Lungs: normal effort Abd: soft, minimally tender, honeycomb present to midline with small amount bloody drainage, NGT with thin bilious fluid, non distended.   GU: foley present with straw colored urine    Lab Results:  Recent Labs    09/25/24 0500 09/26/24 0353  WBC 4.5 5.2  HGB 11.2* 10.7*  HCT 31.9* 30.4*  PLT 225 237   BMET Recent Labs    09/26/24 0353 09/27/24 0442  NA 134* 135  K 3.3* 3.1*  CL 99 98  CO2 24 24  GLUCOSE 76 102*  BUN 19 18  CREATININE 0.61 0.76  CALCIUM  7.3* 7.4*   PT/INR No results for input(s): LABPROT, INR in the last 72 hours. CMP     Component Value Date/Time   NA 135 09/27/2024 0442   K 3.1 (L) 09/27/2024 0442   CL 98 09/27/2024 0442   CO2 24 09/27/2024 0442   GLUCOSE 102 (H) 09/27/2024 0442   BUN 18 09/27/2024 0442   CREATININE 0.76 09/27/2024 0442   CALCIUM  7.4 (L) 09/27/2024 0442   PROT 4.7 (L) 09/24/2024 1953   ALBUMIN  2.5 (L) 09/24/2024 1953   AST 19 09/24/2024 1953   ALT 34 09/24/2024 1953   ALKPHOS 54 09/24/2024 1953   BILITOT 1.1 09/24/2024 1953   GFRNONAA >60 09/27/2024 0442   GFRAA >60 03/27/2018 1003   Lipase     Component Value Date/Time   LIPASE 27 09/08/2024 1229       Studies/Results: No results  found.   Anti-infectives: Anti-infectives (From admission, onward)    Start     Dose/Rate Route Frequency Ordered Stop   09/26/24 1400  piperacillin -tazobactam (ZOSYN ) IVPB 3.375 g        3.375 g 12.5 mL/hr over 240 Minutes Intravenous Every 8 hours 09/26/24 1227 09/29/24 0559   09/25/24 0000  piperacillin -tazobactam (ZOSYN ) IVPB 3.375 g  Status:  Discontinued        3.375 g 12.5 mL/hr over 240 Minutes Intravenous Every 8 hours 09/24/24 1834 09/26/24 0940   09/24/24 1645  piperacillin -tazobactam (ZOSYN ) IVPB 3.375 g        3.375 g 100 mL/hr over 30 Minutes Intravenous Every 8 hours 09/24/24 1634 09/24/24 1645        Assessment/Plan  Hx of robotic gastrojejunostomy 07/03/24 for GOO related to duodenal mass involving the pancreatic head Internal hernia  POD2 S/P ex-lap with reduction and repair of internal hernia defect Dr. Teresa - NGT with thin bilious fluid 1 L recorded for output yesterday - abdomen soft but unclear if any bowel function - clamping trials today and possibly remove NGT tomorrow if tolerating and low residuals - do not think pt needs TPN today but if unable to remove NGT and start PO or enteral feeds tomorrow would  consider TPN  - ok to stop abx and monitor off since no abscess or perforation at time of OR - mobilize as tolerated  FEN:  IVF per [facility] VTE: start SQH ID: Zosyn  11/16>11/18 Foley: present prior to surgery for retention    - per [facility] -  Hx of prostate CA Tobacco use HTN GERD Shingles with encephalitis Recent non-traumatic SAH VDRF Urinary retention with foley present    LOS: 3 days   Jina LITTIE Nephew, MD, FACS, FSSO Surgical Oncology, General Surgery, Trauma and Critical [facility] Surgery, [place] [phone] for weekday/non holidays Check [email or web address] for coverage night/weekend/holidays under General Surgery

## 2024-09-27 NOTE — Care Management Important Message (Signed)
 Important Message  Patient Details  Name: Noah Burnett MRN: [id number] Date of Birth: [birth year]   Important Message Given:  Yes - Medicare IM     Claretta Deed 09/27/2024, 4:17 PM

## 2024-09-27 NOTE — Plan of Care (Signed)

## 2024-09-27 NOTE — Progress Notes (Signed)
 Physical Therapy Treatment Patient Details Name: Noah Burnett MRN: [id number] DOB: January 17, 1941 Today's Date: 09/27/2024   History of Present Illness 83 y/o M presenting from CIR on 11/16 with acute respiratory hypoxia and abdominal distention, found to have ischemia bowel perforation s/p ex lap with reduction and repair of internal hernia on 09/24/2024    PMH includes duodenal mass s/p robotic gastrojejunostomy, nontraumatic SAH    PT Comments  Pt is progressing well towards goals. Currently pt is supervision for bed mobility, CGA for sit to stand and gait with RW. Pt was limited by pain/fatigue this session. Very good family support at home; if 24/7 assist could potentially progress home. Due to pt current functional status, home set up and available assistance at home recommending skilled physical therapy services > 3 hours/day in order to address strength, balance and functional mobility to decrease risk for falls, injury, immobility, skin break down and re-hospitalization.      If plan is discharge home, recommend the following: Assistance with cooking/housework;Assist for transportation;Help with stairs or ramp for entrance;A lot of help with walking and/or transfers     Equipment Recommendations  None recommended by PT       Precautions / Restrictions Precautions Precautions: Fall Recall of Precautions/Restrictions: Impaired Restrictions Weight Bearing Restrictions Per Provider Order: No     Mobility  Bed Mobility Overal bed mobility: Needs Assistance Bed Mobility: Sit to Supine, Supine to Sit     Supine to sit: Supervision Sit to supine: Min assist   General bed mobility comments: Supervision for EOB, Min A for LE to get back to bed due to pain wtih increased abdominal activation    Transfers Overall transfer level: Needs assistance Equipment used: Rolling walker (2 wheels) Transfers: Sit to/from Stand Sit to Stand: Contact guard assist           General  transfer comment: CGA for steadying on standing from EOB. Verbal cues for safe hand placement.    Ambulation/Gait Ambulation/Gait assistance: Contact guard assist Gait Distance (Feet): 50 Feet Assistive device: Rolling walker (2 wheels) Gait Pattern/deviations: Step-through pattern, Decreased stride length, Trunk flexed, Narrow base of support Gait velocity: decreased Gait velocity interpretation: <1.8 ft/sec, indicate of risk for recurrent falls   General Gait Details: CGA for safety, pt limited due to pain.     Balance Overall balance assessment: Needs assistance Sitting-balance support: Feet supported Sitting balance-Leahy Scale: Fair     Standing balance support: Reliant on assistive device for balance, During functional activity, Single extremity supported Standing balance-Leahy Scale: Fair Standing balance comment: no overt LOB          Communication Communication Communication: Impaired Factors Affecting Communication: Hearing impaired  Cognition Arousal: Alert Behavior During Therapy: Flat affect, Agitated   PT - Cognitive impairments: Safety/Judgement     PT - Cognition Comments: HOH hears better on R ear. Pt getting aggravated easily during session. Pt very independent and ready to be out of hospital Following commands: Impaired Following commands impaired: Follows one step commands with increased time, Only follows one step commands consistently    Cueing Cueing Techniques: Verbal cues, Tactile cues, Visual cues     General Comments General comments (skin integrity, edema, etc.): vital signs stable on room air. Pt had O2 in room but O2 sats 98% with [place] off. Niece in room.      Pertinent Vitals/Pain Pain Assessment Pain Assessment: Faces Faces Pain Scale: Hurts whole lot Breathing: occasional labored breathing, short period of hyperventilation Negative Vocalization: occasional  moan/groan, low speech, negative/disapproving quality Facial Expression: sad,  frightened, frown Body Language: tense, distressed pacing, fidgeting Consolability: no need to console PAINAD Score: 4 Pain Location: abdomen Pain Descriptors / Indicators: Discomfort, Grimacing, Guarding Pain Intervention(s): Limited activity within patient's tolerance, Monitored during session, Repositioned     PT Goals (current goals can now be found in the care plan section) Acute Rehab PT Goals Patient Stated Goal: Back to rehab and finish goals there PT Goal Formulation: With patient Time For Goal Achievement: 10/10/24 Potential to Achieve Goals: Good Progress towards PT goals: Progressing toward goals    Frequency    Min 3X/week      PT Plan  Continue with current POC        AM-PAC PT 6 Clicks Mobility   Outcome Measure  Help needed turning from your back to your side while in a flat bed without using bedrails?: A Little Help needed moving from lying on your back to sitting on the side of a flat bed without using bedrails?: A Little Help needed moving to and from a bed to a chair (including a wheelchair)?: A Little Help needed standing up from a chair using your arms (e.g., wheelchair or bedside chair)?: A Little Help needed to walk in hospital room?: A Little Help needed climbing 3-5 steps with a railing? : A Little 6 Click Score: 18    End of Session Equipment Utilized During Treatment: Gait belt (at axilla) Activity Tolerance: Patient tolerated treatment well Patient left: with call bell/phone within reach;with family/visitor present;in bed;with bed alarm set Nurse Communication: Mobility status PT Visit Diagnosis: Unsteadiness on feet (R26.81);Other abnormalities of gait and mobility (R26.89);Muscle weakness (generalized) (M62.81);History of falling (Z91.81);Difficulty in walking, not elsewhere classified (R26.2);Pain Pain - part of body:  (abdomen)     Time: 8556-8493 PT Time Calculation (min) (ACUTE ONLY): 23 min  Charges:    $Therapeutic  Activity: 23-37 mins PT General Charges $$ ACUTE PT VISIT: 1 Visit                     Dorothyann Maier, DPT, CLT  Acute Rehabilitation Services Office: [phone] (Secure chat preferred)    Dorothyann VEAR Maier 09/27/2024, 4:43 PM

## 2024-09-27 NOTE — Progress Notes (Signed)
 Nutrition Brief Note   RD following up on diet advancement versus initiation of nutrition support. NGT to suction has been removed. Surgery to allow sips of clears today. Discussed with surgery PA, no plan to start TPN today but was amendable to placing Cortrak since patient previously had while at CIR. RD discussed with pt and patients daughter at bedside regarding replacing Cortrak to start enteral nutrition when appropriate; pt declined placement of Cortrak. Daughter expressed desire to speak with brother and RD shared that we can reconsider on Friday when Cortrak service is on again. RD reiterated the importance to receive proper nutrition to help maintain lean muscle and strength.    INTERVENTION:  Pt needs nutrition support. +Severe malnutrition, chronic and ongoing, receiving TF via Cortrak prior to acute GI issue with surgery on 11/16. Recommend EN vs TPN pending GI function. Pt previously on EN via Cortrak prior to transfer back to acute care, surgery. Recommend Cortrak placement if patient is agreeable and if able to utilize GI track for at least trickles. If not recommend TPN. If post-op ileus likely, recommend starting TPN. Pt does not have central access which will be a barrier to starting TPN.   If Cortrak placed and TF initiated, recommend: Goal: [place] 1.4 (Standard) at 45 ml/hr with [employer] TF20 60 mL daily Recommend starting at trickle rate and slowly advancing to goal TF provides 87 g of protein, 1592 kcals and 777 mL of free water   RD team will continue to follow.   Nestora Glatter RD, LDN Registered Dietitian I Please see AMION for contact information

## 2024-09-27 NOTE — Progress Notes (Signed)
 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   Abdominal distention workup showed finding concerning for bowel ischemia in the setting of a prior gastrojejunostomy.    Imaging showed evidence of free peritoneal air with multiple mid to distal dilated small bowel with moderate diffuse pneumatosis.  Otherwise there is no PE, moderate bilateral pleural effusion was appreciated. On 11/16 patient was taken to the OR for ischemic bowel with perforation and underwent ex lap with reduction and repair of internal hernia with postop findings of internal hernia containing small bowel, and diffuse pneumatosis intestinalis   11/16 admitted to the ICU while on invasive mechanical ventilation.  Noted aspiration pneumonia on right upper lung.  11/17 patient extubated to nasal cannula.  11/18 transferred to [facility].   Assessment & Plan:  Principal Problem:   SBO (small bowel obstruction) (HCC) Active Problems:   Right upper lobe pneumonia   Essential hypertension   Hyponatremia   Subarachnoid hemorrhage, nontraumatic (HCC)   Pressure injury of skin   Protein-calorie malnutrition, severe   SBO (small bowel obstruction) 11/16 S/p exploratory laparotomy with reduction and repair of internal hernia by Dr Teresa.  General surgery on board.   Post operative ileus Plan to remove NGT today, if possible  and start oral feeds. Patient is passing gas.   Right upper lobe possible bacterial pneumonia Post operative respiratory failure, now resolved.  02 saturation 99% on 3 L/min per [place]    Right upper lobe aspiration pneumonia  11/16 follow up chest radiograph with no frank infiltrate.  Will plan to complete antibiotic therapy for 5 days.  Continue to mobilize, PT and OT.    Essential hypertension Continue blood pressure monitoring.  Holding on antihypertensive agents for now due to risk of hypotension    Hyponatremia, resolved Hypokalemia and hypomagnesemia, prn repletion     Plan to add Kcl 40 meq IV and 2 g mag sulfate.  Continue close follow up renal functiin and electrolytes.  Continue IV fluids with normal saline and dextrose  at 75 ml per hr    Subarachnoid hemorrhage, nontraumatic  Continue pt and ot    Pressure injury of skin Continue local skin care   Protein-calorie malnutrition, severe  Disposition: He was living at home by himself prior to this hospitalization. He will likely need CIR vs SNF placement on discharge.     DVT prophylaxis: heparin  injection 5,000 Units Start: 09/26/24 1400 SCDs Start: 09/24/24 1816     Code Status: Full Code Family Communication:  Daughter, [name] at the bedside Status is: Inpatient Remains inpatient appropriate because: post op ileus    Subjective:  Denied any active complaints. Daughter was present at the bedside, NGT in place. He is passing gas.  Examination:  General exam: Appears calm and comfortable  Respiratory system: Clear to auscultation. Respiratory effort normal. Cardiovascular system: S1 & S2 heard, RRR. No JVD, murmurs, rubs, gallops or clicks. No pedal edema. Gastrointestinal system: Abdominal dressing in place, slight peri-incisional  tenderness present Central nervous system: Alert and oriented. No focal neurological deficits. Extremities: Symmetric 5 x 5 power. Skin: No rashes, lesions or  ulcers Psychiatry: Judgement and insight appear normal. Mood & affect appropriate.   Wound 09/24/24 1756 Pressure Injury Sacrum Medial Stage 1 -  Intact skin with non-blanchable redness of a localized area usually over a bony prominence. (Active)       Objective: Vitals:   09/26/24 2019 09/27/24 0500 09/27/24 0613 09/27/24 0806  BP: 135/76  119/82 139/76  Pulse: 81  83 72  Resp: 17  18 16   Temp: 98.4 F (36.9 C)  97.7 F (36.5 C) (!) 97.3 F (36.3 C)  TempSrc: Axillary  Oral Oral  SpO2: 100%  100% 100%  Weight:  47.1 kg    Height:        Intake/Output Summary (Last 24 hours) at 09/27/2024 1106 Last data filed at 09/27/2024 0614 Gross per 24 hour  Intake 250 ml  Output 1200 ml  Net -950 ml   Filed Weights   09/25/24 0457 09/26/24 0437 09/27/24 0500  Weight: 48.9 kg 48.9 kg 47.1 kg    Scheduled Meds:  Chlorhexidine Gluconate Cloth  6 each Topical Daily   docusate  100 mg Per Tube BID   heparin injection (subcutaneous)  5,000 Units Subcutaneous Q8H   lidocaine   1 patch Transdermal Q24H   pantoprazole  (PROTONIX ) IV  40 mg Intravenous Q24H   Continuous Infusions:  dextrose  5 % and 0.9 % NaCl 75 mL/hr at 09/26/24 1951   piperacillin -tazobactam (ZOSYN )  IV 3.375 g (09/27/24 0500)   potassium chloride 10 mEq (09/27/24 1024)    Nutritional status Signs/Symptoms: severe muscle depletion, severe fat depletion Interventions: Refer to RD note for recommendations Body mass index is 16.26 kg/m.  Data Reviewed:   CBC: Recent Labs  Lab 09/22/24 0526 09/24/24 1706 09/24/24 1953 09/25/24 0436 09/25/24 0500 09/26/24 0353  WBC 9.6  --  6.9  --  4.5 5.2  NEUTROABS 8.3*  --  6.3  --   --   --   HGB 12.5* 10.9* 12.9* 10.5* 11.2* 10.7*  HCT 36.3* 32.0* 36.6* 31.0* 31.9* 30.4*  MCV 88.8  --  88.0  --  88.6 89.4  PLT 208  --  281  --  225 237   Basic Metabolic Panel: Recent Labs  Lab 09/21/24 0501 09/21/24 1955 09/24/24 0535 09/24/24 1706 09/24/24 1953  09/25/24 0436 09/25/24 0500 09/26/24 0353 09/27/24 0442  NA 124*   < > 130*   < > 132* 132* 133* 134* 135  K 3.3*   < > 3.9   < > 4.0 4.2 4.2 3.3* 3.1*  CL 90*   < > 93*  --  96*  --  98 99 98  CO2 25   < > 25  --  21*  --  25 24 24   GLUCOSE 176*   < > 167*  --  182*  --  98 76 102*  BUN 30*   < > 20  --  23  --  23 19 18   CREATININE 0.54*   < > 0.56*  --  0.53*  --  0.56* 0.61 0.76  CALCIUM 6.6*   < > 7.3*  --  7.5*  --  7.5* 7.3* 7.4*  MG  --   --   --   --  1.6*  --  1.6* 1.8 2.0  PHOS 2.2*  --   --   --  4.4  --  4.3  --   --    < > = values in this interval not displayed.   GFR: Estimated Creatinine Clearance: 48.2 mL/min (by C-G formula based on SCr of 0.76 mg/dL). Liver Function Tests: Recent Labs  Lab 09/21/24 0501 09/22/24 0526 09/24/24 1953  AST  --  18 19  ALT  --  26 34  ALKPHOS  --  48 54  BILITOT  --  0.7 1.1  PROT  --  4.1* 4.7*  ALBUMIN  2.2* 2.0* 2.5*   No results for input(s): LIPASE, AMYLASE in the last 168 hours. No results for input(s): AMMONIA in the last 168 hours. Coagulation Profile: No results for input(s): INR, PROTIME in the last 168 hours. Cardiac Enzymes: No results for input(s): CKTOTAL, CKMB, CKMBINDEX, TROPONINI in the last 168 hours. BNP (last 3 results) No results for input(s): PROBNP in the last 8760 hours. HbA1C: No results for input(s): HGBA1C in the last 72 hours. CBG: Recent Labs  Lab 09/26/24 1207 09/26/24 1231 09/27/24 0011 09/27/24 0618 09/27/24 0809  GLUCAP 66* 97 105* 99 132*   Lipid Profile: Recent Labs    09/25/24 0500  TRIG 55   Thyroid Function Tests: No results for input(s): TSH, T4TOTAL, FREET4, T3FREE, THYROIDAB in the last 72 hours. Anemia Panel: No results for input(s): VITAMINB12, FOLATE, FERRITIN, TIBC, IRON, RETICCTPCT in the last 72 hours. Sepsis Labs: Recent Labs  Lab 09/24/24 1851 09/24/24 2138  LATICACIDVEN 1.2 2.1*    Recent Results (from the  past 240 hours)  MRSA Next Gen by PCR, Nasal     Status: None   Collection Time: 09/24/24  6:15 PM   Specimen: Nasal Mucosa; Nasal Swab  Result Value Ref Range Status   MRSA by PCR Next Gen NOT DETECTED NOT DETECTED Final    Comment: (NOTE) The GeneXpert MRSA Assay (FDA approved for NASAL specimens only), is one component of a comprehensive MRSA colonization surveillance program. It is not intended to diagnose MRSA infection nor to guide or monitor treatment for MRSA infections. Test performance is not FDA approved in patients less than 70 years old. Performed at [facility], [address] [place], [place] [address]          Radiology Studies: No results found.         LOS: 3 days   Time spent= 40 mins    Deliliah Room, MD [facility]  If 7PM-7AM, please contact night-coverage  09/27/2024, 11:06 AM

## 2024-09-28 DIAGNOSIS — K56609 Unspecified intestinal obstruction, unspecified as to partial versus complete obstruction: Secondary | ICD-10-CM | POA: Diagnosis not present

## 2024-09-28 LAB — GLUCOSE, CAPILLARY
Glucose-Capillary: 95 mg/dL (ref 70–99)
Glucose-Capillary: 85 mg/dL (ref 70–99)
Glucose-Capillary: 118 mg/dL — ABNORMAL HIGH (ref 70–99)

## 2024-09-28 MED ORDER — DOCUSATE SODIUM 100 MG PO CAPS
100.0000 mg | ORAL_CAPSULE | Freq: Two times a day (BID) | ORAL | Status: DC
  Administered 2024-09-29 – 2024-10-06 (×7): 100 mg via ORAL
  Filled 2024-09-28 (×15): qty 1

## 2024-09-28 MED ORDER — BOOST / RESOURCE BREEZE PO LIQD CUSTOM
1.0000 | Freq: Three times a day (TID) | ORAL | Status: DC
  Administered 2024-09-29: 1 via ORAL

## 2024-09-28 NOTE — Progress Notes (Signed)
 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   Abdominal distention workup showed finding concerning for bowel ischemia in the setting of a prior gastrojejunostomy.    Imaging showed evidence of free peritoneal air with multiple mid to distal dilated small bowel with moderate diffuse pneumatosis.  Otherwise there is no PE, moderate bilateral pleural effusion was appreciated. On 11/16 patient was taken to the OR for ischemic bowel with perforation and underwent ex lap with reduction and repair of internal hernia with postop findings of internal hernia containing small bowel, and diffuse pneumatosis intestinalis   11/16 admitted to the ICU while on invasive mechanical ventilation.  Noted aspiration pneumonia on right upper lung.  11/17 patient extubated to nasal cannula.  11/18 transferred to [facility].   Assessment & Plan:  Principal Problem:   SBO (small bowel obstruction) (HCC) Active Problems:   Right upper lobe pneumonia   Essential hypertension   Hyponatremia   Subarachnoid hemorrhage, nontraumatic (HCC)   Pressure injury of skin   Protein-calorie malnutrition, severe   SBO (small bowel obstruction) 11/16 S/p exploratory laparotomy with reduction and repair of internal hernia by Dr Teresa.  General surgery on board.   Post operative ileus NG tube has been removed Patient is  passing gas but no bowel movements yet   Right upper lobe possible bacterial pneumonia Post operative respiratory failure, now resolved.  Patient is not requiring any supplemental oxygen now   Right upper lobe aspiration pneumonia  11/16 follow up chest radiograph with no frank infiltrate.  Continue with intravenous Zosyn  for a total of 5 days Continue to mobilize, PT and OT.    Essential hypertension Continue blood pressure monitoring.  Holding on antihypertensive agents for now due to risk of hypotension    Hyponatremia, resolved Hypokalemia and hypomagnesemia, prn repletion    Subarachnoid hemorrhage, nontraumatic  Continue pt and ot    Pressure injury of skin Continue local skin care   Protein-calorie malnutrition, severe  Disposition: He was living at home by himself prior to this hospitalization. He will likely need CIR vs SNF placement on discharge.     DVT prophylaxis: heparin  injection 5,000 Units Start: 09/26/24 1400 SCDs Start: 09/24/24 1816     Code Status: Full Code Family Communication: None at the bedside Status is: Inpatient Remains inpatient appropriate because: post op ileus    Subjective:  No acute events overnight.  NG tube removed.  His only complaint is peri-incisional abdominal pain.  He is passing gas but has not had a bowel movement yet.  Examination:  General exam: Appears calm and comfortable  Respiratory system: Clear to auscultation. Respiratory effort normal. Cardiovascular system: S1 & S2 heard, RRR. No JVD, murmurs, rubs, gallops or clicks. No pedal edema. Gastrointestinal system: Abdominal dressing in place, slight peri-incisional tenderness present but no evidence of discharge Central nervous system: Alert and oriented. No focal neurological deficits. Extremities: Symmetric 5 x 5 power. Skin: No rashes, lesions or ulcers  Wound 09/24/24 1756 Pressure Injury Sacrum Medial Stage 1 -  Intact skin with non-blanchable redness of a  localized area usually over a bony prominence. (Active)     Diet Orders (From admission, onward)     Start     Ordered   09/27/24 1314  Diet NPO time specified Except for: [employer], Sips with Meds, Other (See Comments)  Diet effective now       Comments: Sips of clears from floor  Question Answer Comment  Except for Ice Chips   Except for Sips with Meds   Except for Other (See Comments)      09/27/24 1313            Objective: Vitals:   09/27/24 0613 09/27/24 0806 09/27/24 1703 09/27/24 1930  BP: 119/82 139/76 124/72 126/70  Pulse: 83 72 70 72  Resp: 18 16 18 16   Temp: 97.7 F (36.5 C) (!) 97.3 F (36.3 C) 97.6 F (36.4 C) 97.7 F (36.5 C)  TempSrc: Oral Oral Oral Oral  SpO2: 100% 100% 98% 99%  Weight:      Height:        Intake/Output Summary (Last 24 hours) at 09/28/2024 0932 Last data filed at 09/28/2024 0500 Gross per 24 hour  Intake --  Output 2100 ml  Net -2100 ml   Filed Weights   09/25/24 0457 09/26/24 0437 09/27/24 0500  Weight: 48.9 kg 48.9 kg 47.1 kg    Scheduled Meds:  Chlorhexidine  Gluconate Cloth  6 each Topical Daily   docusate  100 mg Per Tube BID   heparin  injection (subcutaneous)  5,000 Units Subcutaneous Q8H   lidocaine   1 patch Transdermal Q24H   pantoprazole  (PROTONIX ) IV  40 mg Intravenous Q24H   Continuous Infusions:  piperacillin -tazobactam (ZOSYN )  IV 3.375 g (09/28/24 9372)    Nutritional status Signs/Symptoms: severe muscle depletion, severe fat depletion Interventions: Refer to RD note for recommendations Body mass index is 16.26 kg/m.  Data Reviewed:   CBC: Recent Labs  Lab 09/22/24 0526 09/24/24 1706 09/24/24 1953 09/25/24 0436 09/25/24 0500 09/26/24 0353  WBC 9.6  --  6.9  --  4.5 5.2  NEUTROABS 8.3*  --  6.3  --   --   --   HGB 12.5* 10.9* 12.9* 10.5* 11.2* 10.7*  HCT 36.3* 32.0* 36.6* 31.0* 31.9* 30.4*  MCV 88.8  --  88.0  --  88.6 89.4  PLT 208  --  281  --  225 237   Basic Metabolic  Panel: Recent Labs  Lab 09/24/24 0535 09/24/24 1706 09/24/24 1953 09/25/24 0436 09/25/24 0500 09/26/24 0353 09/27/24 0442  NA 130*   < > 132* 132* 133* 134* 135  K 3.9   < > 4.0 4.2 4.2 3.3* 3.1*  CL 93*  --  96*  --  98 99 98  CO2 25  --  21*  --  25 24 24   GLUCOSE 167*  --  182*  --  98 76 102*  BUN 20  --  23  --  23 19 18   CREATININE 0.56*  --  0.53*  --  0.56* 0.61 0.76  CALCIUM  7.3*  --  7.5*  --  7.5* 7.3* 7.4*  MG  --   --  1.6*  --  1.6* 1.8 2.0  PHOS  --   --  4.4  --  4.3  --   --    < > = values in this interval not displayed.   GFR:  Estimated Creatinine Clearance: 48.2 mL/min (by C-G formula based on SCr of 0.76 mg/dL). Liver Function Tests: Recent Labs  Lab 09/22/24 0526 09/24/24 1953  AST 18 19  ALT 26 34  ALKPHOS 48 54  BILITOT 0.7 1.1  PROT 4.1* 4.7*  ALBUMIN 2.0* 2.5*   No results for input(s): LIPASE, AMYLASE in the last 168 hours. No results for input(s): AMMONIA in the last 168 hours. Coagulation Profile: No results for input(s): INR, PROTIME in the last 168 hours. Cardiac Enzymes: No results for input(s): CKTOTAL, CKMB, CKMBINDEX, TROPONINI in the last 168 hours. BNP (last 3 results) No results for input(s): PROBNP in the last 8760 hours. HbA1C: No results for input(s): HGBA1C in the last 72 hours. CBG: Recent Labs  Lab 09/27/24 0011 09/27/24 0618 09/27/24 0809 09/27/24 2330 09/28/24 0726  GLUCAP 105* 99 132* 109* 95   Lipid Profile: No results for input(s): CHOL, HDL, LDLCALC, TRIG, CHOLHDL, LDLDIRECT in the last 72 hours.  Thyroid Function Tests: No results for input(s): TSH, T4TOTAL, FREET4, T3FREE, THYROIDAB in the last 72 hours. Anemia Panel: No results for input(s): VITAMINB12, FOLATE, FERRITIN, TIBC, IRON, RETICCTPCT in the last 72 hours. Sepsis Labs: Recent Labs  Lab 09/24/24 1851 09/24/24 2138  LATICACIDVEN 1.2 2.1*    Recent Results (from the past 240 hours)   MRSA Next Gen by PCR, Nasal     Status: None   Collection Time: 09/24/24  6:15 PM   Specimen: Nasal Mucosa; Nasal Swab  Result Value Ref Range Status   MRSA by PCR Next Gen NOT DETECTED NOT DETECTED Final    Comment: (NOTE) The GeneXpert MRSA Assay (FDA approved for NASAL specimens only), is one component of a comprehensive MRSA colonization surveillance program. It is not intended to diagnose MRSA infection nor to guide or monitor treatment for MRSA infections. Test performance is not FDA approved in patients less than 17 years old. Performed at [facility], [address], [place], [place] [address]          Radiology Studies: No results found.         LOS: 4 days   Time spent= 40 mins    Deliliah Room, MD [facility]  If 7PM-7AM, please contact night-coverage  09/28/2024, 9:32 AM

## 2024-09-28 NOTE — Progress Notes (Signed)
 Progress Note  4 Days Post-Op  Subjective: Family at bedside.  Patient denies complaints. No abdominal pain. Tolerating small sips of liquids per report. No n/v. Passing gas. No BM.    Afebrile. No tachycardia or hypotension. WBC wnl on last checkon 11/18.   Objective: Vital signs in last 24 hours: Temp:  [97.3 F (36.3 C)-97.7 F (36.5 C)] 97.3 F (36.3 C) (11/20 0843) Pulse Rate:  [52-72] 52 (11/20 0843) Resp:  [16-18] 16 (11/20 0843) BP: (124-137)/(70-80) 137/80 (11/20 0843) SpO2:  [97 %-99 %] 97 % (11/20 0843) Last BM Date :  (PTA)  Intake/Output from previous day: 11/19 0701 - 11/20 0700 In: -  Out: 2100 [Urine:2100] Intake/Output this shift: No intake/output data recorded.  PE: General: WD, chronically ill appearing elderly male, NAD.  Abd: soft, ND, NT, midline wound with staples in place, cdi GU: foley present with straw colored urine    Lab Results:  Recent Labs    09/26/24 0353  WBC 5.2  HGB 10.7*  HCT 30.4*  PLT 237   BMET Recent Labs    09/26/24 0353 09/27/24 0442  NA 134* 135  K 3.3* 3.1*  CL 99 98  CO2 24 24  GLUCOSE 76 102*  BUN 19 18  CREATININE 0.61 0.76  CALCIUM 7.3* 7.4*   PT/INR No results for input(s): LABPROT, INR in the last 72 hours. CMP     Component Value Date/Time   NA 135 09/27/2024 0442   K 3.1 (L) 09/27/2024 0442   CL 98 09/27/2024 0442   CO2 24 09/27/2024 0442   GLUCOSE 102 (H) 09/27/2024 0442   BUN 18 09/27/2024 0442   CREATININE 0.76 09/27/2024 0442   CALCIUM 7.4 (L) 09/27/2024 0442   PROT 4.7 (L) 09/24/2024 1953   ALBUMIN 2.5 (L) 09/24/2024 1953   AST 19 09/24/2024 1953   ALT 34 09/24/2024 1953   ALKPHOS 54 09/24/2024 1953   BILITOT 1.1 09/24/2024 1953   GFRNONAA >60 09/27/2024 0442   GFRAA >60 03/27/2018 1003   Lipase     Component Value Date/Time   LIPASE 27 09/08/2024 1229       Studies/Results: No results found.   Anti-infectives: Anti-infectives (From admission, onward)     Start     Dose/Rate Route Frequency Ordered Stop   09/26/24 1400  piperacillin -tazobactam (ZOSYN ) IVPB 3.375 g        3.375 g 12.5 mL/hr over 240 Minutes Intravenous Every 8 hours 09/26/24 1227 09/29/24 0559   09/25/24 0000  piperacillin -tazobactam (ZOSYN ) IVPB 3.375 g  Status:  Discontinued        3.375 g 12.5 mL/hr over 240 Minutes Intravenous Every 8 hours 09/24/24 1834 09/26/24 0940   09/24/24 1645  piperacillin -tazobactam (ZOSYN ) IVPB 3.375 g        3.375 g 100 mL/hr over 30 Minutes Intravenous Every 8 hours 09/24/24 1634 09/24/24 1645        Assessment/Plan  Hx of robotic gastrojejunostomy 07/03/24 for GOO related to duodenal mass involving the pancreatic head Internal hernia  POD4 S/P ex-lap with reduction and repair of internal hernia defect Dr. Teresa on 11/16 - Allow CLD and boost.  - Reported to require cortrak/TF's prior to surgery. May need this replaced/restarted for supplemental nutrition  - On abx for aspiration pna. Okay to stop from our standpoint.  - Mobilize as tolerated. PT - Pulm toilet  FEN: CLD, boost, IVF per [facility] VTE: SCDs,  SQH ID: [email or web address]  11/16 Foley: present prior to surgery for retention,  per primary, okay to remove from our standpoint   - per [facility] -  Hx of prostate CA Tobacco use HTN GERD Shingles with encephalitis Recent non-traumatic SAH VDRF Urinary retention with foley present    LOS: 4 days   Noah Burnett Nephew, MD, FACS, FSSO Surgical Oncology, General Surgery, Trauma and Critical [facility], [place] [phone] for weekday/non holidays Check [email or web address] for coverage night/weekend/holidays under General Surgery

## 2024-09-28 NOTE — Progress Notes (Signed)
 Brief Nutrition Note  RD following up on diet advancement versus initiation of nutrition support. Pt advanced to clear liquid diet after 4 days of NPO status. Surgery following with no plan to start TPN but but surgery discussed the possible need to replace Cortrak tomorrow for supplemental nutrition if pt's PO intake is not improved on CLD. Pt previously denied wanting Cortrak when RD had discussed it yesterday. Sister in law at bedside today and reports ordering pt a variety of items for lunch to encourage PO intake, but also stressed understanding the importance improvement in pt's PO intake. If intake does not improve on CLD and pt continues to deny Cortrak, recommend palliative and GOC discussions as pt is at high risk for continued decline if nutrition status does not improve.      Josette Glance, MS, RDN, LDN Clinical Dietitian I Please reach out via secure chat

## 2024-09-28 NOTE — Progress Notes (Signed)
 Physical Therapy Treatment Patient Details Name: Noah Burnett MRN: [id number] DOB: July 14, 1941 Today's Date: 09/28/2024   History of Present Illness 83 y/o M presenting from CIR on 11/16 with acute respiratory hypoxia and abdominal distention, found to have ischemia bowel perforation s/p ex lap with reduction and repair of internal hernia on 09/24/2024    PMH includes duodenal mass s/p robotic gastrojejunostomy, nontraumatic SAH    PT Comments  Pt reports high pain levels today but is agreeable to participate in physical therapy session. Pt requiring min-mod assist for bed mobility and transfers. Ambulating 50 ft with RW and CGA (+2 safety with equipment). Pt sister in law at bedside and supportive. Patient will benefit from intensive inpatient follow-up therapy, >3 hours/day to address deficits and maximize functional mobility.     If plan is discharge home, recommend the following: Assistance with cooking/housework;Assist for transportation;Help with stairs or ramp for entrance;A lot of help with walking and/or transfers   Can travel by private vehicle        Equipment Recommendations  None recommended by PT    Recommendations for Other Services       Precautions / Restrictions Precautions Precautions: Fall Recall of Precautions/Restrictions: Impaired Restrictions Weight Bearing Restrictions Per Provider Order: No     Mobility  Bed Mobility Overal bed mobility: Needs Assistance Bed Mobility: Sidelying to Sit   Sidelying to sit: Mod assist       General bed mobility comments: Pt received in R sidelying, assist at trunk to elevate to sitting position    Transfers Overall transfer level: Needs assistance Equipment used: Rolling walker (2 wheels) Transfers: Sit to/from Stand Sit to Stand: Min assist           General transfer comment: MinA to power up to stand    Ambulation/Gait Ambulation/Gait assistance: Contact guard assist, +2 safety/equipment Gait  Distance (Feet): 50 Feet Assistive device: Rolling walker (2 wheels) Gait Pattern/deviations: Step-through pattern, Decreased stride length, Trunk flexed, Narrow base of support       General Gait Details: CGA for safety, pt limited due to pain.   Stairs             Wheelchair Mobility     Tilt Bed    Modified Rankin (Stroke Patients Only)       Balance Overall balance assessment: Needs assistance Sitting-balance support: Feet supported Sitting balance-Leahy Scale: Fair     Standing balance support: Reliant on assistive device for balance, During functional activity, Single extremity supported Standing balance-Leahy Scale: Poor                              Communication Communication Communication: Impaired Factors Affecting Communication: Hearing impaired  Cognition Arousal: Alert Behavior During Therapy: Flat affect   PT - Cognitive impairments: Safety/Judgement                         Following commands: Intact      Cueing Cueing Techniques: Verbal cues  Exercises      General Comments        Pertinent Vitals/Pain Pain Assessment Pain Assessment: Faces Faces Pain Scale: Hurts even more Pain Location: abdomen Pain Descriptors / Indicators: Discomfort, Grimacing, Guarding Pain Intervention(s): Monitored during session, Other (comment), Limited activity within patient's tolerance (RN notified)    Home Living  Prior Function            PT Goals (current goals can now be found in the care plan section) Acute Rehab PT Goals Potential to Achieve Goals: Good Progress towards PT goals: Progressing toward goals    Frequency    Min 3X/week      PT Plan      Co-evaluation              AM-PAC PT 6 Clicks Mobility   Outcome Measure  Help needed turning from your back to your side while in a flat bed without using bedrails?: A Little Help needed moving from lying on your  back to sitting on the side of a flat bed without using bedrails?: A Lot Help needed moving to and from a bed to a chair (including a wheelchair)?: A Little Help needed standing up from a chair using your arms (e.g., wheelchair or bedside chair)?: A Little Help needed to walk in hospital room?: A Little Help needed climbing 3-5 steps with a railing? : Total 6 Click Score: 15    End of Session Equipment Utilized During Treatment: Gait belt Activity Tolerance: Patient tolerated treatment well Patient left: in chair;with call bell/phone within reach;with family/visitor present Nurse Communication: Mobility status;Other (comment) (no chair alarm box) PT Visit Diagnosis: Unsteadiness on feet (R26.81);Other abnormalities of gait and mobility (R26.89);Muscle weakness (generalized) (M62.81);History of falling (Z91.81);Difficulty in walking, not elsewhere classified (R26.2);Pain Pain - part of body:  (abdomen)     Time: 8897-8874 PT Time Calculation (min) (ACUTE ONLY): 23 min  Charges:    $Therapeutic Activity: 23-37 mins PT General Charges $$ ACUTE PT VISIT: 1 Visit                     Noah Burnett, PT, DPT Acute Rehabilitation Services Office [phone]    Noah Burnett 09/28/2024, 2:00 PM

## 2024-09-28 NOTE — Plan of Care (Signed)

## 2024-09-28 NOTE — Progress Notes (Signed)
 Occupational Therapy Treatment Patient Details Name: Noah Burnett MRN: [id number] DOB: 1940/12/28 Today's Date: 09/28/2024   History of present illness 83 y/o M presenting from CIR on 11/16 with acute respiratory hypoxia and abdominal distention, found to have ischemia bowel perforation s/p ex lap with reduction and repair of internal hernia on 09/24/2024    PMH includes duodenal mass s/p robotic gastrojejunostomy, nontraumatic SAH   OT comments  OT session focused on training in use of built-up grips for utensils and grooming tools and therapeutic exercises (see below) for improved coordination, increased strength, and decreased edema in dominant R hand. Pt eating lunch upon OT arrival with pt noted to be self-feeding with non-dominant L hand and with significant spillage due to difficulty with R hand grasp and coordination. OT provided and trialed built-up foam grips with pt with pt demonstrating ability to self feed and complete grooming tasks using dominant R hand with use of built-up foam grips with Set up and with pt reporting improved comfort and ease of use of utensils/tools. Pt participated well in session and is making progress toward OT goals. Acute OT to continue to follow. Post acute discharge, pt will benefit from intensive inpatient skilled rehab services > 3 hours per day to maximize rehab potential. Pt may be able to flip to [facility] OT as pt is making good progress and has good family support at home.       If plan is discharge home, recommend the following:  A little help with walking and/or transfers;A lot of help with bathing/dressing/bathroom;Direct supervision/assist for financial management;Assistance with cooking/housework;Direct supervision/assist for medications management;Help with stairs or ramp for entrance;Supervision due to cognitive status   Equipment Recommendations  BSC/3in1;Other (comment) (RW; foam grips provided by OT this session)    Recommendations for Other  Services      Precautions / Restrictions Precautions Precautions: Fall Recall of Precautions/Restrictions: Impaired Restrictions Weight Bearing Restrictions Per Provider Order: No       Mobility Bed Mobility Overal bed mobility: Needs Assistance             General bed mobility comments: Pt sitting in recliner at beginning and end of session    Transfers Overall transfer level: Needs assistance Equipment used: Rolling walker (2 wheels) Transfers: Sit to/from Stand Sit to Stand: Min assist           General transfer comment: MinA to power up to stand to reposition in the recliner. Further mobility deferred this session due to pt wanting to finish lunch     Balance Overall balance assessment: Needs assistance Sitting-balance support: No upper extremity supported, Feet supported Sitting balance-Leahy Scale: Fair     Standing balance support: Bilateral upper extremity supported, Single extremity supported, During functional activity, Reliant on assistive device for balance Standing balance-Leahy Scale: Poor                             ADL either performed or assessed with clinical judgement   ADL Overall ADL's : Needs assistance/impaired Eating/Feeding: Set up;Sitting (with use of built-up foam grips for utensils) Eating/Feeding Details (indicate cue type and reason): Pt eating lunch upon OT arrival with pt self-feeding using non-dominant Left hand due to decreased grasp/coordination in dominant R hand. Pt with significant spillage and difficulty feeding self with both hands without a built up grip. OT provided and trialled use of foam for built-up grip with pt demonstrating ability to self-feed with dominant R hand  with limited spillage with use of built-up grip and pr reporting increased comfort and ease of use Grooming: Set up;Sitting (with foam for built-up grips) Grooming Details (indicate cue type and reason): Pt demonstrating difficulty to grasp tools  for grooming (i.e. toothbrush) with dominant R hand. OT provided and trialed use of foam built-up grips on items with pt then demonstrating ability to complete tasks with Set up using dominant Right hand and with pt reporting increased comfort and ease of use                                    Extremity/Trunk Assessment Upper Extremity Assessment Upper Extremity Assessment: Right hand dominant;Generalized weakness;RUE deficits/detail;LUE deficits/detail RUE Deficits / Details: generalized weakness; decrease grasp (R>L); decreased fine motor coordination; noted mildly edematous in hand and wrist RUE Coordination: decreased fine motor LUE Deficits / Details: generalized weakness; decrease grasp (R>L); decreased fine motor coordination   Lower Extremity Assessment Lower Extremity Assessment: Defer to PT evaluation        Vision       Perception     Praxis     Communication Communication Communication: Impaired Factors Affecting Communication: Hearing impaired   Cognition Arousal: Alert Behavior During Therapy: Flat affect Cognition: Cognition impaired   Orientation impairments: Time Awareness: Intellectual awareness intact, Online awareness impaired Memory impairment (select all impairments): Short-term memory Attention impairment (select first level of impairment): Selective attention Executive functioning impairment (select all impairments): Problem solving OT - Cognition Comments: incr time to follow all commands, but possibly related to [facility]                 Following commands: Intact (requires mildly increased time; suspect due to [facility])        Cueing   Cueing Techniques: Verbal cues, Gestural cues, Visual cues  Exercises Exercises: Hand exercises Hand Exercises Digit Composite Flexion: AROM, Right, 5 reps, Seated, Strengthening (increased coordination; decreased edema) Composite Extension: AROM, Right, 5 reps, Seated, Strengthening (increased  coordination; decreased edema) Opposition: AROM, Right, 5 reps, Strengthening, Seated (increased coordination; decreased edema)    Shoulder Instructions       General Comments VSS on RA. Pt family member present and supportive during session    Pertinent Vitals/ Pain       Pain Assessment Pain Assessment: Faces Faces Pain Scale: Hurts little more Pain Location: abdomen Pain Descriptors / Indicators: Discomfort, Grimacing, Guarding Pain Intervention(s): Monitored during session, Limited activity within patient's tolerance, Repositioned, Other (comment) (RN notified)  Home Living                                          Prior Functioning/Environment              Frequency  Min 2X/week        Progress Toward Goals  OT Goals(current goals can now be found in the care plan section)  Progress towards OT goals: Progressing toward goals  Acute Rehab OT Goals Patient Stated Goal: to be able to use his dominant Right hand more effectively and to feel better  Plan      Co-evaluation                 AM-PAC OT 6 Clicks Daily Activity     Outcome Measure   Help from another person eating  meals?: A Little Help from another person taking care of personal grooming?: A Little Help from another person toileting, which includes using toliet, bedpan, or urinal?: A Lot Help from another person bathing (including washing, rinsing, drying)?: A Lot Help from another person to put on and taking off regular upper body clothing?: A Lot Help from another person to put on and taking off regular lower body clothing?: A Lot 6 Click Score: 14    End of Session Equipment Utilized During Treatment: Other (comment);Rolling walker (2 wheels) (built-up foam grips)  OT Visit Diagnosis: Unsteadiness on feet (R26.81);Other abnormalities of gait and mobility (R26.89);Muscle weakness (generalized) (M62.81)   Activity Tolerance Patient tolerated treatment well   Patient  Left in chair;with call bell/phone within reach;with family/visitor present   Nurse Communication Mobility status;Other (comment) (Pt with questions regarding what beverages are allowed with clear liquid diet and with questions regarding pain medication schedule)        Time: 1202-1218 OT Time Calculation (min): 16 min  Charges: OT General Charges $OT Visit: 1 Visit OT Treatments $Self Care/Home Management : 8-22 mins  Margarie Rockey HERO., OTR/L, MA Acute Rehab [phone]   Margarie FORBES Horns 09/28/2024, 2:53 PM

## 2024-09-29 DIAGNOSIS — K56609 Unspecified intestinal obstruction, unspecified as to partial versus complete obstruction: Secondary | ICD-10-CM | POA: Diagnosis not present

## 2024-09-29 LAB — CBC WITH DIFFERENTIAL/PLATELET
Abs Immature Granulocytes: 0.08 K/uL — ABNORMAL HIGH (ref 0.00–0.07)
Basophils Absolute: 0 K/uL (ref 0.0–0.1)
Basophils Relative: 0 %
Eosinophils Absolute: 0 K/uL (ref 0.0–0.5)
Eosinophils Relative: 1 %
HCT: 36.5 % — ABNORMAL LOW (ref 39.0–52.0)
Hemoglobin: 12.6 g/dL — ABNORMAL LOW (ref 13.0–17.0)
Immature Granulocytes: 1 %
Lymphocytes Relative: 11 %
Lymphs Abs: 0.7 K/uL (ref 0.7–4.0)
MCH: 30.7 pg (ref 26.0–34.0)
MCHC: 34.5 g/dL (ref 30.0–36.0)
MCV: 88.8 fL (ref 80.0–100.0)
Monocytes Absolute: 0.4 K/uL (ref 0.1–1.0)
Monocytes Relative: 7 %
Neutro Abs: 5 K/uL (ref 1.7–7.7)
Neutrophils Relative %: 80 %
Platelets: 234 K/uL (ref 150–400)
RBC: 4.11 MIL/uL — ABNORMAL LOW (ref 4.22–5.81)
RDW: 17.2 % — ABNORMAL HIGH (ref 11.5–15.5)
WBC: 6.2 K/uL (ref 4.0–10.5)
nRBC: 0 % (ref 0.0–0.2)

## 2024-09-29 LAB — BASIC METABOLIC PANEL WITH GFR
Anion gap: 11 (ref 5–15)
BUN: 11 mg/dL (ref 8–23)
CO2: 26 mmol/L (ref 22–32)
Calcium: 7.2 mg/dL — ABNORMAL LOW (ref 8.9–10.3)
Chloride: 93 mmol/L — ABNORMAL LOW (ref 98–111)
Creatinine, Ser: 0.53 mg/dL — ABNORMAL LOW (ref 0.61–1.24)
GFR, Estimated: >60 mL/min (ref >60)
Glucose, Bld: 107 mg/dL — ABNORMAL HIGH (ref 70–99)
Potassium: 2.6 mmol/L — CL (ref 3.5–5.1)
Sodium: 130 mmol/L — ABNORMAL LOW (ref 135–145)

## 2024-09-29 LAB — GLUCOSE, CAPILLARY
Glucose-Capillary: 116 mg/dL — ABNORMAL HIGH (ref 70–99)
Glucose-Capillary: 116 mg/dL — ABNORMAL HIGH (ref 70–99)
Glucose-Capillary: 98 mg/dL (ref 70–99)

## 2024-09-29 LAB — MAGNESIUM: Magnesium: 1.6 mg/dL — ABNORMAL LOW (ref 1.7–2.4)

## 2024-09-29 LAB — TROPONIN I (HIGH SENSITIVITY)
Troponin I (High Sensitivity): 19 ng/L — ABNORMAL HIGH (ref <18)
Troponin I (High Sensitivity): 19 ng/L — ABNORMAL HIGH (ref <18)

## 2024-09-29 MED ORDER — ALUM & MAG HYDROXIDE-SIMETH 200-200-20 MG/5ML PO SUSP
30.0000 mL | ORAL | Status: DC | PRN
Start: 2024-09-29 — End: 2024-10-06

## 2024-09-29 MED ORDER — POTASSIUM CHLORIDE 10 MEQ/100ML IV SOLN
10.0000 meq | INTRAVENOUS | Status: AC
  Administered 2024-09-29 (×4): 10 meq via INTRAVENOUS
  Filled 2024-09-29 (×4): qty 100

## 2024-09-29 MED ORDER — ADULT MULTIVITAMIN W/MINERALS CH
1.0000 | ORAL_TABLET | Freq: Every day | ORAL | Status: DC
  Administered 2024-09-30 – 2024-10-06 (×7): 1 via ORAL
  Filled 2024-09-29 (×7): qty 1

## 2024-09-29 MED ORDER — POTASSIUM CHLORIDE 20 MEQ PO PACK
40.0000 meq | PACK | Freq: Once | ORAL | Status: DC
  Filled 2024-09-29: qty 2

## 2024-09-29 MED ORDER — MAGNESIUM SULFATE 2 GM/50ML IV SOLN
2.0000 g | Freq: Once | INTRAVENOUS | Status: AC
  Administered 2024-09-29: 2 g via INTRAVENOUS
  Filled 2024-09-29: qty 50

## 2024-09-29 MED ORDER — KATE FARMS STANDARD 1.4 PO LIQD
325.0000 mL | Freq: Two times a day (BID) | ORAL | Status: DC
  Administered 2024-09-30 – 2024-10-06 (×5): 325 mL via ORAL
  Filled 2024-09-29 (×16): qty 325

## 2024-09-29 MED ORDER — BENZONATATE 100 MG PO CAPS: 100.0000 mg | ORAL_CAPSULE | Freq: Three times a day (TID) | ORAL | Status: DC | PRN

## 2024-09-29 NOTE — Progress Notes (Signed)
 IP rehab admissions - I spoke with patient, his daughter and his nurse.  Patient is not yet medically ready for return to inpatient rehab.  Will follow up on Monday.  [phone]

## 2024-09-29 NOTE — Progress Notes (Signed)
 Occupational Therapy Treatment Patient Details Name: Noah Burnett MRN: [id number] DOB: [birth year] Today's Date: 09/29/2024   History of present illness 83 y/o M presenting from CIR on 11/16 with acute respiratory hypoxia and abdominal distention, found to have ischemia bowel perforation s/p ex lap with reduction and repair of internal hernia on 09/24/2024    PMH includes duodenal mass s/p robotic gastrojejunostomy, nontraumatic SAH   OT comments  Pt in bed upon therapy arrival. Encouragement needed to participate in skilled OT session focusing on functional mobility and activity tolerance. Pt verbalized frustration with the medical set backs he has experienced. Continues to demonstrate decreased balance, strength, and activity tolerance. Plan is to return to intensive inpatient follow-up therapy, >3 hours/day when medically cleared.        If plan is discharge home, recommend the following:  A little help with walking and/or transfers;A lot of help with bathing/dressing/bathroom;Direct supervision/assist for financial management;Assistance with cooking/housework;Direct supervision/assist for medications management;Help with stairs or ramp for entrance;Supervision due to cognitive status   Equipment Recommendations  BSC/3in1       Precautions / Restrictions Precautions Precautions: Fall Recall of Precautions/Restrictions: Impaired Restrictions Weight Bearing Restrictions Per Provider Order: No       Mobility Bed Mobility Overal bed mobility: Needs Assistance Bed Mobility: Supine to Sit, Sit to Supine     Supine to sit: Supervision, HOB elevated Sit to supine: Min assist   General bed mobility comments: Required assist to bring legs up onto bed. No assist needed to bring legs to edge of bed and off.    Transfers Overall transfer level: Needs assistance Equipment used: Rolling walker (2 wheels) Transfers: Sit to/from Stand, Bed to chair/wheelchair/BSC Sit to Stand: Min  assist     Step pivot transfers: Contact guard assist     General transfer comment: VC for hand placement and power up technique while using RW. Pt completed functional mobility while walking from bed out into hallway and back with CGA and use of RW.     Balance Overall balance assessment: Needs assistance Sitting-balance support: No upper extremity supported, Feet supported Sitting balance-Leahy Scale: Fair     Standing balance support: Bilateral upper extremity supported, During functional activity, Reliant on assistive device for balance Standing balance-Leahy Scale: Poor      ADL either performed or assessed with clinical judgement   ADL    Upper Body Dressing : Minimal assistance;Sitting Upper Body Dressing Details (indicate cue type and reason): gown for back               Communication Communication Communication: Impaired Factors Affecting Communication: Hearing impaired   Cognition Arousal: Alert Behavior During Therapy: Flat affect Cognition: Cognition impaired   Orientation impairments: Time Awareness: Intellectual awareness intact, Online awareness impaired Memory impairment (select all impairments): Short-term memory Attention impairment (select first level of impairment): Selective attention Executive functioning impairment (select all impairments): Problem solving  Following commands: Intact Following commands impaired: Follows one step commands with increased time      Cueing   Cueing Techniques: Verbal cues, Gestural cues        General Comments on RA    Pertinent Vitals/ Pain       Pain Assessment Pain Assessment: Faces Faces Pain Scale: No hurt         Frequency  Min 2X/week        Progress Toward Goals  OT Goals(current goals can now be found in the care plan section)  Progress towards OT goals: Progressing  toward goals            AM-PAC OT 6 Clicks Daily Activity     Outcome Measure   Help from another person eating  meals?: A Little Help from another person taking care of personal grooming?: A Little Help from another person toileting, which includes using toliet, bedpan, or urinal?: A Lot Help from another person bathing (including washing, rinsing, drying)?: A Lot Help from another person to put on and taking off regular upper body clothing?: A Lot Help from another person to put on and taking off regular lower body clothing?: A Lot 6 Click Score: 14    End of Session Equipment Utilized During Treatment: Rolling walker (2 wheels)  OT Visit Diagnosis: Unsteadiness on feet (R26.81);Other abnormalities of gait and mobility (R26.89);Muscle weakness (generalized) (M62.81)   Activity Tolerance Patient tolerated treatment well   Patient Left in bed;with call bell/phone within reach;with bed alarm set           Time: 1142-1202 OT Time Calculation (min): 20 min  Charges: OT General Charges $OT Visit: 1 Visit OT Treatments $Therapeutic Activity: 8-22 mins  Leita Howell, OTR/L,CBIS  Supplemental OT - [facility] and [facility] Secure Chat Preferred    Nyko Gell, Leita BIRCH 09/29/2024, 1:15 PM

## 2024-09-29 NOTE — Progress Notes (Addendum)
 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   Abdominal distention workup showed finding concerning for bowel ischemia in the setting of a prior gastrojejunostomy.    Imaging showed evidence of free peritoneal air with multiple mid to distal dilated small bowel with moderate diffuse pneumatosis.  Otherwise there is no PE, moderate bilateral pleural effusion was appreciated. On 11/16 patient was taken to the OR for ischemic bowel with perforation and underwent ex lap with reduction and repair of internal hernia with postop findings of internal hernia containing small bowel, and diffuse pneumatosis intestinalis   11/16 admitted to the ICU while on invasive mechanical ventilation.  Noted aspiration pneumonia on right upper lung.  11/17 patient extubated to nasal cannula.  11/18 transferred to [facility].   Assessment & Plan:  Principal Problem:   SBO (small bowel obstruction) (HCC) Active Problems:   Right upper lobe pneumonia   Essential hypertension   Hyponatremia   Subarachnoid hemorrhage, nontraumatic (HCC)   Pressure injury of skin   Protein-calorie malnutrition, severe   SBO (small bowel obstruction) 11/16 S/p exploratory laparotomy with reduction and repair of internal hernia by Dr Teresa.  General surgery on board. Prn analgesics   Post operative ileus NG tube has been  removed Patient is passing gas but and had a BM this morning On a full liquid diet now   Right upper lobe possible bacterial pneumonia Post operative respiratory failure, now resolved.  Patient is not requiring any supplemental oxygen now   Right upper lobe aspiration pneumonia  11/16 follow up chest radiograph with no frank infiltrate.  Continue with intravenous Zosyn  for a total of 5 days Continue to mobilize, PT and OT.   Severe hypokalemia: K 2.6 today. Ordered a total of 40 meq IV KCL and 40 meq oral Kcl.  F/u K and Mag levels in am Telemetry monitoring   Essential hypertension Continue blood pressure monitoring.  Holding on antihypertensive agents for now due to risk of hypotension    Hyponatremia, continue to monitor closely. Likely hypovolemic secondary to poor oral intake.    Subarachnoid hemorrhage, nontraumatic  Continue pt and ot    Pressure injury of skin Continue local skin care   Protein-calorie malnutrition, severe  Chest pain: Patient's nurse notified me of chest pain. I ordered STAT EKG and Trop I x 2 and went at the bedside to assess the patient. Patient's chest pain had resolved. Spoke to the son and daughter at the bedside. Will also order a CXR as patient has been having cough. Transfer orders placed to tele floor. Nurse was informed as well.  Disposition: Likely back to CIR     DVT prophylaxis: heparin  injection 5,000 Units Start: 09/26/24 1400 SCDs Start: 09/24/24 1816     Code Status: Full Code Family Communication: daughter at the bedside Status is: Inpatient Remains inpatient appropriate because: post op ileus    Subjective:  No acute events overnight.  He said that he had a BM  this morning. He was able to tolerate a clear liquid diet and is on a full liquid diet now. He wants his omeprazole  to be resumed and said that protonix  doesn't work. Still complaining of peri-incisional pain with minimal movement but no pain while still.    Examination:  General exam: Appears calm and comfortable  Respiratory system: Clear to auscultation. Respiratory effort normal. Cardiovascular system: S1 & S2 heard, RRR. No JVD, murmurs, rubs, gallops or clicks. No pedal edema. Gastrointestinal system: Abdominal dressing in place, slight peri-incisional tenderness present but no evidence of discharge Central nervous system: Alert and oriented. No focal neurological deficits. Extremities: Symmetric 5 x 5 power. Skin: No rashes, lesions or ulcers   Wound 09/24/24 1756 Pressure Injury Sacrum Medial Stage 1 -  Intact skin with non-blanchable redness of a localized area usually over a bony prominence. (Active)     Diet Orders (From admission, onward)     Start     Ordered   09/29/24 0802  Diet full liquid Room service appropriate? Yes; Fluid consistency: Thin  Diet effective now       Question Answer Comment  Room service appropriate? Yes   Fluid consistency: Thin      09/29/24 0801            Objective: Vitals:   09/27/24 1930 09/28/24 2041 09/29/24 0523 09/29/24 0747  BP: 126/70 138/70 136/77 131/89  Pulse: 72 72 85 87  Resp: 16 17 17 19   Temp: 97.7 F (36.5 C) 98.3 F (36.8 C) 98.5 F (36.9 C) 98.2 F (36.8 C)  TempSrc: Oral Oral Oral Oral  SpO2: 99% 98% 100% 99%  Weight:      Height:        Intake/Output Summary (Last 24 hours) at 09/29/2024 1043 Last data filed at 09/29/2024 0600 Gross per 24 hour  Intake 480 ml  Output 1600 ml  Net -1120 ml   Filed Weights   09/25/24 0457 09/26/24 0437 09/27/24 0500  Weight: 48.9 kg 48.9 kg 47.1 kg    Scheduled Meds:  Chlorhexidine  Gluconate Cloth  6 each Topical Daily   docusate sodium   100 mg Oral BID   feeding supplement  1 Container Oral TID BM   heparin  injection (subcutaneous)  5,000 Units Subcutaneous Q8H   lidocaine   1 patch Transdermal Q24H   pantoprazole  (PROTONIX ) IV  40 mg Intravenous Q24H   Continuous Infusions:    Nutritional  status Signs/Symptoms: severe muscle depletion, severe fat depletion Interventions: Refer to RD note for recommendations Body mass index is 16.26 kg/m.  Data Reviewed:   CBC: Recent Labs  Lab 09/24/24 1953 09/25/24 0436 09/25/24 0500 09/26/24 0353 09/29/24 0912  WBC 6.9  --  4.5 5.2 6.2  NEUTROABS 6.3  --   --   --  5.0  HGB 12.9* 10.5* 11.2* 10.7* 12.6*  HCT 36.6* 31.0* 31.9* 30.4* 36.5*  MCV 88.0  --  88.6 89.4 88.8  PLT 281  --  225 237 234   Basic Metabolic Panel: Recent Labs  Lab 09/24/24 0535 09/24/24 1706 09/24/24 1953 09/25/24 0436 09/25/24 0500 09/26/24 0353 09/27/24 0442  NA 130*   < > 132* 132* 133* 134* 135  K 3.9   < > 4.0 4.2 4.2 3.3* 3.1*  CL 93*  --  96*  --  98 99 98  CO2 25  --  21*  --  25 24 24   GLUCOSE 167*  --  182*  --  98 76 102*  BUN  20  --  23  --  23 19 18   CREATININE 0.56*  --  0.53*  --  0.56* 0.61 0.76  CALCIUM  7.3*  --  7.5*  --  7.5* 7.3* 7.4*  MG  --   --  1.6*  --  1.6* 1.8 2.0  PHOS  --   --  4.4  --  4.3  --   --    < > = values in this interval not displayed.   GFR: Estimated Creatinine Clearance: 48.2 mL/min (by C-G formula based on SCr of 0.76 mg/dL). Liver Function Tests: Recent Labs  Lab 09/24/24 1953  AST 19  ALT 34  ALKPHOS 54  BILITOT 1.1  PROT 4.7*  ALBUMIN  2.5*   No results for input(s): LIPASE, AMYLASE in the last 168 hours. No results for input(s): AMMONIA in the last 168 hours. Coagulation Profile: No results for input(s): INR, PROTIME in the last 168 hours. Cardiac Enzymes: No results for input(s): CKTOTAL, CKMB, CKMBINDEX, TROPONINI in the last 168 hours. BNP (last 3 results) No results for input(s): PROBNP in the last 8760 hours. HbA1C: No results for input(s): HGBA1C in the last 72 hours. CBG: Recent Labs  Lab 09/27/24 2330 09/28/24 0726 09/28/24 1125 09/28/24 1600 09/29/24 0537  GLUCAP 109* 95 85 118* 116*   Lipid Profile: No results for input(s): CHOL, HDL,  LDLCALC, TRIG, CHOLHDL, LDLDIRECT in the last 72 hours.  Thyroid Function Tests: No results for input(s): TSH, T4TOTAL, FREET4, T3FREE, THYROIDAB in the last 72 hours. Anemia Panel: No results for input(s): VITAMINB12, FOLATE, FERRITIN, TIBC, IRON, RETICCTPCT in the last 72 hours. Sepsis Labs: Recent Labs  Lab 09/24/24 1851 09/24/24 2138  LATICACIDVEN 1.2 2.1*    Recent Results (from the past 240 hours)  MRSA Next Gen by PCR, Nasal     Status: None   Collection Time: 09/24/24  6:15 PM   Specimen: Nasal Mucosa; Nasal Swab  Result Value Ref Range Status   MRSA by PCR Next Gen NOT DETECTED NOT DETECTED Final    Comment: (NOTE) The GeneXpert MRSA Assay (FDA approved for NASAL specimens only), is one component of a comprehensive MRSA colonization surveillance program. It is not intended to diagnose MRSA infection nor to guide or monitor treatment for MRSA infections. Test performance is not FDA approved in patients less than 81 years old. Performed at [facility], [address] [place], [place] [address]          Radiology Studies: No results found.         LOS: 5 days   Time spent= 40 mins    Deliliah Room, MD [facility]  If 7PM-7AM, please contact night-coverage  09/29/2024, 10:43 AM

## 2024-09-29 NOTE — Progress Notes (Signed)
 Initial Nutrition Assessment  DOCUMENTATION CODES:   Underweight, Severe malnutrition in context of chronic illness  INTERVENTION:   Continue full liquid diet, advance as tolerated Encourage PO intake  Discontinue Boost breeze and change to [employer] 1.4 vanilla shake po BID  Add MVI w/minerals daily  Check Phos and Mg due to low K on recent BMPs, replete as needed If GOC change and patient amendable to Cortrak, and TF initiated, recommend: Goal: [employer] 1.4 (Standard) at 45 ml/hr with [employer] TF20 60 mL daily Recommend starting at trickle rate and slowly advancing to goal TF provides 87 g of protein, 1592 kcals and 777 mL of free water   NUTRITION DIAGNOSIS:   Severe Malnutrition related to chronic illness as evidenced by severe muscle depletion, severe fat depletion. - continues   GOAL:   Patient will meet greater than or equal to 90% of their needs - not met   MONITOR:   Diet advancement, Labs, Weight trends, Skin, I & O's  REASON FOR ASSESSMENT:   Consult Assessment of nutrition requirement/status  ASSESSMENT:   83 yo male admitted from CIR on 11/16 for acute respiratory failure with abdominal distention. Stat CT obtained and concerning for SB pneumatosis, ischemic bowel with free air. Pt take emergently to OR for ex lap requiring reduction and repair of internal hernia. 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 robotic gastrojejunostomy on 07/03/24 (required TPN), shingles with encephalitis, tobacco use, HTN, GERD, hx of prostate cancer, deafness (left ear), full dentures  Previous Admission (09/08/24): 10/31 Admitted for FTT, initial discussion re: starting TPN by MD (but TPN never started) 11/07 Cortrak placed, EN initiated 11/13 Admitted to CIR, continued TF via Cortrak  This Admission: 11/16 Admitted from CIR, NPO, OR: ex lap with reduction and repair of internal hernia 2/2 diffuse pneumatosis intestinalis, internal  hernia containing SB 11/17 Extubated 11/18 NG Clamping trials initiated 11/19 - NGT removed   Patient seen in room, daughter at bedside. Pt advanced to full liquids this morning, working on eating vanilla pudding. Pt continues to decline placement of cortrak for nutrition support, he has been told previously why it its recommend if PO intake does not improve as pt is at high risk of continued decline if he cannot improve his nutrition status. Daughter reports patient is making an effort to eat more and did well with liquids at dinner last night. Discussed with patient re adding [employer] protein shakes now that diet is advanced and will discontinue boost brreeze as patient does not like that supplement.    Admit weight: 48.9 kg  Current weight: 47.1 kg  Nutritionally Relevant Medications: Colace, protonix   Labs Reviewed: K 2.6 (replacement ordered), Na 130, Cr 0.53, Calcium  7.2    NUTRITION - FOCUSED PHYSICAL EXAM:  Flowsheet Row Most Recent Value  Orbital Region Severe depletion  Upper Arm Region Severe depletion  Thoracic and Lumbar Region Severe depletion  Buccal Region Severe depletion  Temple Region Severe depletion  Clavicle Bone Region Severe depletion  Clavicle and Acromion Bone Region Severe depletion  Scapular Bone Region Severe depletion  Dorsal Hand Moderate depletion  Patellar Region Severe depletion  Anterior Thigh Region Severe depletion  Posterior Calf Region Severe depletion  Edema (RD Assessment) None    Diet Order:   Diet Order             Diet full liquid Room service appropriate? Yes; Fluid consistency: Thin  Diet effective now  EDUCATION NEEDS:   Not appropriate for education at this time  Skin:  Skin Assessment: Skin Integrity Issues: Skin Integrity Issues:: Stage I, Incisions Stage I: sacrum Incisions: abdomen (closed)  Last BM:  11/16  Height:   Ht Readings from Last 1 Encounters:  09/24/24 5' 7.01 (1.702 m)     Weight:   Wt Readings from Last 1 Encounters:  09/27/24 47.1 kg     BMI:  Body mass index is 16.26 kg/m.  Estimated Nutritional Needs:   Kcal:  1500-1700 kcals  Protein:  75-90 g  Fluid:  >/= 1.5 L  Ernesha Ramone, MS, RD, LDN Clinical Dietitian  Contact via secure chat. If unavailable, use group chat RD Inpatient.

## 2024-09-29 NOTE — Progress Notes (Signed)
 C/o pain, but says passing gas.  adv fulls    Progress Note  5 Days Post-Op  Subjective: Patient complains of pain this AM. + flatus.  Patient says he had BM, but none reported.  Tolerating clears.   Objective: Vital signs in last 24 hours: Temp:  [97.3 F (36.3 C)-98.5 F (36.9 C)] 98.2 F (36.8 C) (11/21 0747) Pulse Rate:  [52-85] 85 (11/21 0523) Resp:  [16-19] 19 (11/21 0747) BP: (131-141)/(70-89) 131/89 (11/21 0747) SpO2:  [93 %-100 %] 100 % (11/21 0523) Last BM Date : 09/29/24  Intake/Output from previous day: 11/20 0701 - 11/21 0700 In: 480 [P.O.:480] Out: 1600 [Urine:1600] Intake/Output this shift: No intake/output data recorded.  PE: General: WD, chronically ill appearing elderly male, looks uncomfortable Abd: soft, ND, NT, midline wound with staples in place, cdi    Lab Results:  No results for input(s): WBC, HGB, HCT, PLT in the last 72 hours.  BMET Recent Labs    09/27/24 0442  NA 135  K 3.1*  CL 98  CO2 24  GLUCOSE 102*  BUN 18  CREATININE 0.76  CALCIUM  7.4*   PT/INR No results for input(s): LABPROT, INR in the last 72 hours. CMP     Component Value Date/Time   NA 135 09/27/2024 0442   K 3.1 (L) 09/27/2024 0442   CL 98 09/27/2024 0442   CO2 24 09/27/2024 0442   GLUCOSE 102 (H) 09/27/2024 0442   BUN 18 09/27/2024 0442   CREATININE 0.76 09/27/2024 0442   CALCIUM  7.4 (L) 09/27/2024 0442   PROT 4.7 (L) 09/24/2024 1953   ALBUMIN  2.5 (L) 09/24/2024 1953   AST 19 09/24/2024 1953   ALT 34 09/24/2024 1953   ALKPHOS 54 09/24/2024 1953   BILITOT 1.1 09/24/2024 1953   GFRNONAA >60 09/27/2024 0442   GFRAA >60 03/27/2018 1003   Lipase     Component Value Date/Time   LIPASE 27 09/08/2024 1229       Studies/Results: No results found.   Anti-infectives: Anti-infectives (From admission, onward)    Start     Dose/Rate Route Frequency Ordered Stop   09/26/24 1400  piperacillin -tazobactam (ZOSYN ) IVPB 3.375 g        3.375  g 12.5 mL/hr over 240 Minutes Intravenous Every 8 hours 09/26/24 1227 09/29/24 0214   09/25/24 0000  piperacillin -tazobactam (ZOSYN ) IVPB 3.375 g  Status:  Discontinued        3.375 g 12.5 mL/hr over 240 Minutes Intravenous Every 8 hours 09/24/24 1834 09/26/24 0940   09/24/24 1645  piperacillin -tazobactam (ZOSYN ) IVPB 3.375 g        3.375 g 100 mL/hr over 30 Minutes Intravenous Every 8 hours 09/24/24 1634 09/24/24 1645        Assessment/Plan  Hx of robotic gastrojejunostomy 07/03/24 for GOO related to duodenal mass involving the pancreatic head Internal hernia  POD5 S/P ex-lap with reduction and repair of internal hernia defect Dr. Teresa on 11/16 - adv to full liquids.  - Reported to require cortrak/TF's prior to surgery. May need this replaced/restarted for supplemental nutrition - per medicine team. Ok to start.  - On abx for aspiration pna. Okay to stop from our standpoint.  - Mobilize as tolerated. PT - Pulm toilet  FEN: FLD, boost, IVF per [facility] VTE: SCDs,  SQH ID: Zosyn  11/16 Foley: present prior to surgery for retention, per primary, okay to remove from our standpoint   - per [facility] -  Hx of prostate CA Tobacco use HTN GERD Shingles  with encephalitis Recent non-traumatic SAH VDRF Urinary retention with foley present    LOS: 5 days   Jina LITTIE Nephew, MD, FACS, FSSO Surgical Oncology, General Surgery, Trauma and Critical [facility], [place] [phone] for weekday/non holidays Check [email or web address] for coverage night/weekend/holidays under General Surgery

## 2024-09-29 NOTE — Plan of Care (Signed)
  Problem: Education: Goal: Knowledge of General Education information will improve Description: Including pain rating scale, medication(s)/side effects and non-pharmacologic comfort measures Outcome: Progressing   Problem: Health Behavior/Discharge Planning: Goal: Ability to manage health-related needs will improve Outcome: Progressing   Problem: Clinical Measurements: Goal: Ability to maintain clinical measurements within normal limits will improve Outcome: Progressing Goal: Diagnostic test results will improve Outcome: Progressing   Problem: Activity: Goal: Risk for activity intolerance will decrease Outcome: Progressing   Problem: Coping: Goal: Level of anxiety will decrease Outcome: Progressing   Problem: Pain Managment: Goal: General experience of comfort will improve and/or be controlled Outcome: Progressing   Problem: Skin Integrity: Goal: Risk for impaired skin integrity will decrease Outcome: Progressing   Problem: Education: Goal: Ability to identify signs and symptoms of gastrointestinal bleeding will improve Outcome: Progressing

## 2024-09-29 NOTE — TOC Progression Note (Signed)
 Transition of Care [facility]) - Progression Note    Patient Details  Name: EULON ALLNUTT MRN: [id number] Date of Birth: 1941-02-17  [facility] CM/SW Contact  Lauraine FORBES Saa, LCSWA Phone Number: 09/29/2024, 2:54 PM  Clinical Narrative:     2:54 PM Per progressions, patient is not yet medically ready to discharge due to post op ileus. Per chart review, [facility] continues to follow patient for medical stability. TOC will continue to follow.  Expected Discharge Plan: IP Rehab Facility Barriers to Discharge: Continued Medical Work up, [employer] and Services   Discharge Planning Services: CM Consult Post Acute Care Choice: IP Rehab Living arrangements for the past 2 months: Single Family Home                                       Social Drivers of Health (SDOH) Interventions SDOH Screenings   Food Insecurity: No Food Insecurity (09/21/2024)  Housing: Low Risk  (09/21/2024)  Transportation Needs: No Transportation Needs (09/21/2024)  Utilities: Not At Risk (09/21/2024)  Social Connections: Unknown (09/08/2024)  Tobacco Use: Medium Risk (09/21/2024)    Readmission Risk Interventions     No data to display

## 2024-09-29 NOTE — Progress Notes (Signed)
 Patient admitted to [id number]. On assessment right hand is swollen, red and warm to touch. +2 radial pulse, full sensation, with no complaints of pain. Patient's son reports this is new. Dino, MD was made aware.  Patient also has a stage 2 sacral wound, previously documented as stage 1. Sacral foam applied and patient positioned on his right side.

## 2024-09-30 ENCOUNTER — Inpatient Hospital Stay (HOSPITAL_COMMUNITY)

## 2024-09-30 DIAGNOSIS — K56609 Unspecified intestinal obstruction, unspecified as to partial versus complete obstruction: Secondary | ICD-10-CM | POA: Diagnosis not present

## 2024-09-30 LAB — BASIC METABOLIC PANEL WITH GFR
Anion gap: 10 (ref 5–15)
BUN: 7 mg/dL — ABNORMAL LOW (ref 8–23)
CO2: 23 mmol/L (ref 22–32)
Calcium: 7.2 mg/dL — ABNORMAL LOW (ref 8.9–10.3)
Chloride: 96 mmol/L — ABNORMAL LOW (ref 98–111)
Creatinine, Ser: 0.45 mg/dL — ABNORMAL LOW (ref 0.61–1.24)
GFR, Estimated: >60 mL/min (ref >60)
Glucose, Bld: 81 mg/dL (ref 70–99)
Potassium: 2.9 mmol/L — ABNORMAL LOW (ref 3.5–5.1)
Sodium: 129 mmol/L — ABNORMAL LOW (ref 135–145)

## 2024-09-30 LAB — PHOSPHORUS: Phosphorus: 2.1 mg/dL — ABNORMAL LOW (ref 2.5–4.6)

## 2024-09-30 LAB — MAGNESIUM: Magnesium: 1.8 mg/dL (ref 1.7–2.4)

## 2024-09-30 LAB — GLUCOSE, CAPILLARY
Glucose-Capillary: 90 mg/dL (ref 70–99)
Glucose-Capillary: 100 mg/dL — ABNORMAL HIGH (ref 70–99)

## 2024-09-30 MED ORDER — POTASSIUM PHOSPHATES 15 MMOLE/5ML IV SOLN
15.0000 mmol | Freq: Once | INTRAVENOUS | Status: AC
  Administered 2024-09-30: 15 mmol via INTRAVENOUS
  Filled 2024-09-30: qty 5

## 2024-09-30 MED ORDER — POTASSIUM CHLORIDE 10 MEQ/100ML IV SOLN
10.0000 meq | INTRAVENOUS | Status: AC
Start: 2024-09-30 — End: 2024-09-30
  Administered 2024-09-30 (×4): 10 meq via INTRAVENOUS
  Filled 2024-09-30: qty 100

## 2024-09-30 NOTE — Progress Notes (Addendum)
 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   Abdominal distention workup showed finding concerning for bowel ischemia in the setting of a prior gastrojejunostomy.    Imaging showed evidence of free peritoneal air with multiple mid to distal dilated small bowel with moderate diffuse pneumatosis.  Otherwise there is no PE, moderate bilateral pleural effusion was appreciated. On 11/16 patient was taken to the OR for ischemic bowel with perforation and underwent ex lap with reduction and repair of internal hernia with postop findings of internal hernia containing small bowel, and diffuse pneumatosis intestinalis   11/16 admitted to the ICU while on invasive mechanical ventilation.  Noted aspiration pneumonia on right upper lung.  11/17 patient extubated to nasal cannula.  11/18 transferred to [facility].   Assessment & Plan:  Principal Problem:   SBO (small bowel obstruction) (HCC) Active Problems:   Right upper lobe pneumonia   Essential hypertension   Hyponatremia   Subarachnoid hemorrhage, nontraumatic (HCC)   Pressure injury of skin   Protein-calorie malnutrition, severe   SBO (small bowel obstruction) 11/16 S/p exploratory laparotomy with reduction and repair of internal hernia by Dr Teresa.  General surgery on board. Prn analgesics   Post operative ileus NG tube has been  removed Patient is passing gas and had a BM On a full liquid diet now   Right upper lobe possible bacterial pneumonia Post operative respiratory failure, now resolved.  Patient is not requiring any supplemental oxygen now   Right upper lobe aspiration pneumonia  11/16 follow up chest radiograph with no frank infiltrate.  Continue with intravenous Zosyn  for a total of 5 days Continue to mobilize, PT and OT.   Severe hypokalemia: repleted on 11/21. Ordered repletion on 11/22 as well with 40 meq of IV Kcl F/u K and Mag levels Telemetry monitoring  Hypophosphatemia: Ordered repletion with 15 mmol of Kphos.   Essential hypertension Continue blood pressure monitoring.  Holding on antihypertensive agents for now due to risk of hypotension    Hyponatremia, continue to monitor closely. Likely hypovolemic secondary to poor oral intake.    Subarachnoid hemorrhage, nontraumatic  Continue pt and ot    Pressure injury of skin Continue local skin care   Protein-calorie malnutrition, severe  Disposition: Likely back to CIR, once medically stable.     DVT prophylaxis: heparin  injection 5,000 Units Start: 09/26/24 1400 SCDs Start: 09/24/24 1816     Code Status: Full Code Family Communication: None at the bedside Status is: Inpatient Remains inpatient appropriate because: post op ileus    Subjective:  No acute events overnight.  Denies chest pain or shortness of breath. He had an episode of chest pain yesterday evening but resolved shortly. Unremarkable EKG, Trop and CXR.  Examination:  General exam: Appears calm and comfortable  Respiratory system: Clear to auscultation. Respiratory effort normal. Cardiovascular system: S1 & S2 heard, RRR. No JVD, murmurs, rubs, gallops or clicks. No pedal edema. Gastrointestinal system: Abdominal dressing in place, slight  peri-incisional tenderness present but no evidence of discharge Central nervous system: Alert and oriented. No focal  neurological deficits. Extremities: Symmetric 5 x 5 power. Skin: No rashes, lesions or ulcers   Wound 09/24/24 1756 Pressure Injury Sacrum Medial Stage 1 -  Intact skin with non-blanchable redness of a localized area usually over a bony prominence. (Active)     Diet Orders (From admission, onward)     Start     Ordered   09/29/24 0802  Diet full liquid Room service appropriate? Yes; Fluid consistency: Thin  Diet effective now       Question Answer Comment  Room service appropriate? Yes   Fluid consistency: Thin      09/29/24 0801            Objective: Vitals:   09/29/24 1937 09/30/24 0429 09/30/24 0500 09/30/24 0734  BP: 137/62 118/65  (!) 118/58  Pulse: (!) 54 60  74  Resp: 16 16  18   Temp: 98 F (36.7 C) (!) 97.5 F (36.4 C)  97.6 F (36.4 C)  TempSrc: Oral Axillary    SpO2: 97% 99%  98%  Weight:   48.7 kg   Height:        Intake/Output Summary (Last 24 hours) at 09/30/2024 0955 Last data filed at 09/29/2024 1921 Gross per 24 hour  Intake --  Output 1200 ml  Net -1200 ml   Filed Weights   09/26/24 0437 09/27/24 0500 09/30/24 0500  Weight: 48.9 kg 47.1 kg 48.7 kg    Scheduled Meds:  Chlorhexidine  Gluconate Cloth  6 each Topical Daily   docusate sodium   100 mg Oral BID   feeding supplement (KATE FARMS STANDARD 1.4)  325 mL Oral BID BM   heparin  injection (subcutaneous)  5,000 Units Subcutaneous Q8H   lidocaine   1 patch Transdermal Q24H   multivitamin with minerals  1 tablet Oral Daily   pantoprazole  (PROTONIX ) IV  40 mg Intravenous Q24H   potassium chloride   40 mEq Oral Once   Continuous Infusions:    Nutritional status Signs/Symptoms: severe muscle depletion, severe fat depletion Interventions: Refer to RD note for recommendations Body mass index is 16.81 kg/m.  Data Reviewed:   CBC: Recent Labs  Lab 09/24/24 1953 09/25/24 0436 09/25/24 0500 09/26/24 0353 09/29/24 0912  WBC 6.9  --  4.5 5.2 6.2  NEUTROABS 6.3  --   --   --  5.0   HGB 12.9* 10.5* 11.2* 10.7* 12.6*  HCT 36.6* 31.0* 31.9* 30.4* 36.5*  MCV 88.0  --  88.6 89.4 88.8  PLT 281  --  225 237 234   Basic Metabolic Panel: Recent Labs  Lab 09/24/24 1953 09/25/24 0436 09/25/24 0500 09/26/24 0353 09/27/24 0442 09/29/24 0912 09/29/24 1130  NA 132* 132* 133* 134* 135 130*  --   K 4.0 4.2 4.2 3.3* 3.1* 2.6*  --   CL 96*  --  98 99 98 93*  --   CO2 21*  --  25 24 24 26   --   GLUCOSE 182*  --  98 76 102* 107*  --   BUN 23  --  23 19 18 11   --   CREATININE 0.53*  --  0.56* 0.61 0.76 0.53*  --   CALCIUM  7.5*  --  7.5* 7.3* 7.4* 7.2*  --   MG 1.6*  --  1.6* 1.8 2.0  --  1.6*  PHOS 4.4  --  4.3  --   --   --   --  GFR: Estimated Creatinine Clearance: 49.9 mL/min (A) (by C-G formula based on SCr of 0.53 mg/dL (L)). Liver Function Tests: Recent Labs  Lab 09/24/24 1953  AST 19  ALT 34  ALKPHOS 54  BILITOT 1.1  PROT 4.7*  ALBUMIN  2.5*   No results for input(s): LIPASE, AMYLASE in the last 168 hours. No results for input(s): AMMONIA in the last 168 hours. Coagulation Profile: No results for input(s): INR, PROTIME in the last 168 hours. Cardiac Enzymes: No results for input(s): CKTOTAL, CKMB, CKMBINDEX, TROPONINI in the last 168 hours. BNP (last 3 results) No results for input(s): PROBNP in the last 8760 hours. HbA1C: No results for input(s): HGBA1C in the last 72 hours. CBG: Recent Labs  Lab 09/28/24 1600 09/29/24 0537 09/29/24 1350 09/29/24 2309 09/30/24 0615  GLUCAP 118* 116* 116* 98 90   Lipid Profile: No results for input(s): CHOL, HDL, LDLCALC, TRIG, CHOLHDL, LDLDIRECT in the last 72 hours.  Thyroid Function Tests: No results for input(s): TSH, T4TOTAL, FREET4, T3FREE, THYROIDAB in the last 72 hours. Anemia Panel: No results for input(s): VITAMINB12, FOLATE, FERRITIN, TIBC, IRON, RETICCTPCT in the last 72 hours. Sepsis Labs: Recent Labs  Lab 09/24/24 1851 09/24/24 2138   LATICACIDVEN 1.2 2.1*    Recent Results (from the past 240 hours)  MRSA Next Gen by PCR, Nasal     Status: None   Collection Time: 09/24/24  6:15 PM   Specimen: Nasal Mucosa; Nasal Swab  Result Value Ref Range Status   MRSA by PCR Next Gen NOT DETECTED NOT DETECTED Final    Comment: (NOTE) The GeneXpert MRSA Assay (FDA approved for NASAL specimens only), is one component of a comprehensive MRSA colonization surveillance program. It is not intended to diagnose MRSA infection nor to guide or monitor treatment for MRSA infections. Test performance is not FDA approved in patients less than 58 years old. Performed at [facility], [address], [place], [place] [address]          Radiology Studies: DG CHEST PORT 1 VIEW Result Date: 09/30/2024 CLINICAL DATA:  Chest pain EXAM: PORTABLE CHEST 1 VIEW COMPARISON:  09/24/2024 FINDINGS: Lung apices and upper mediastinum obscured by the patient's face. Subtle left base atelectasis or infiltrate. Subtle patchy airspace disease noted right upper lung. Interstitial markings are diffusely coarsened with chronic features. The cardiopericardial silhouette is within normal limits for size. Telemetry leads overlie the chest. IMPRESSION: Subtle patchy airspace disease right upper lung and left base. Imaging features could be related to atelectasis or pneumonia. Electronically Signed   By: Camellia Candle M.D.   On: 09/30/2024 09:28           LOS: 6 days   Time spent= 40 mins    Deliliah Room, MD [facility]  If 7PM-7AM, please contact night-coverage  09/30/2024, 9:55 AM

## 2024-09-30 NOTE — Plan of Care (Signed)
  Problem: Education: Goal: Knowledge of General Education information will improve Description: Including pain rating scale, medication(s)/side effects and non-pharmacologic comfort measures 09/30/2024 1922 by Benjamine Shu, RN Outcome: Progressing 09/30/2024 1922 by Benjamine Shu, RN Outcome: Progressing   Problem: Clinical Measurements: Goal: Ability to maintain clinical measurements within normal limits will improve Outcome: Progressing   Problem: Activity: Goal: Risk for activity intolerance will decrease Outcome: Progressing   Problem: Nutrition: Goal: Adequate nutrition will be maintained Outcome: Progressing   Problem: Coping: Goal: Level of anxiety will decrease Outcome: Progressing   Problem: Skin Integrity: Goal: Risk for impaired skin integrity will decrease Outcome: Progressing

## 2024-09-30 NOTE — Progress Notes (Signed)
 6 Days Post-Op   Subjective/Chief Complaint: Wants more to eat   Objective: Vital signs in last 24 hours: Temp:  [97.5 F (36.4 C)-98.6 F (37 C)] 97.6 F (36.4 C) (11/22 0734) Pulse Rate:  [54-74] 74 (11/22 0734) Resp:  [16-18] 18 (11/22 0734) BP: (118-151)/(58-85) 118/58 (11/22 0734) SpO2:  [96 %-99 %] 98 % (11/22 0734) Weight:  [48.7 kg] 48.7 kg (11/22 0500) Last BM Date : 09/29/24  Intake/Output from previous day: 11/21 0701 - 11/22 0700 In: -  Out: 1200 [Urine:1200] Intake/Output this shift: No intake/output data recorded.   Abd: soft, ND, NT, midline wound with staples in place, cdi    Lab Results:  Recent Labs    09/29/24 0912  WBC 6.2  HGB 12.6*  HCT 36.5*  PLT 234   BMET Recent Labs    09/29/24 0912 09/30/24 0930  NA 130* 129*  K 2.6* 2.9*  CL 93* 96*  CO2 26 23  GLUCOSE 107* 81  BUN 11 7*  CREATININE 0.53* 0.45*  CALCIUM  7.2* 7.2*   PT/INR No results for input(s): LABPROT, INR in the last 72 hours. ABG No results for input(s): PHART, HCO3 in the last 72 hours.  Invalid input(s): PCO2, PO2  Studies/Results: DG CHEST PORT 1 VIEW Result Date: 09/30/2024 CLINICAL DATA:  Chest pain EXAM: PORTABLE CHEST 1 VIEW COMPARISON:  09/24/2024 FINDINGS: Lung apices and upper mediastinum obscured by the patient's face. Subtle left base atelectasis or infiltrate. Subtle patchy airspace disease noted right upper lung. Interstitial markings are diffusely coarsened with chronic features. The cardiopericardial silhouette is within normal limits for size. Telemetry leads overlie the chest. IMPRESSION: Subtle patchy airspace disease right upper lung and left base. Imaging features could be related to atelectasis or pneumonia. Electronically Signed   By: Noah Burnett M.D.   On: 09/30/2024 09:28    Anti-infectives: Anti-infectives (From admission, onward)    Start     Dose/Rate Route Frequency Ordered Stop   09/26/24 1400  piperacillin -tazobactam  (ZOSYN ) IVPB 3.375 g        3.375 g 12.5 mL/hr over 240 Minutes Intravenous Every 8 hours 09/26/24 1227 09/29/24 0214   09/25/24 0000  piperacillin -tazobactam (ZOSYN ) IVPB 3.375 g  Status:  Discontinued        3.375 g 12.5 mL/hr over 240 Minutes Intravenous Every 8 hours 09/24/24 1834 09/26/24 0940   09/24/24 1645  piperacillin -tazobactam (ZOSYN ) IVPB 3.375 g        3.375 g 100 mL/hr over 30 Minutes Intravenous Every 8 hours 09/24/24 1634 09/24/24 1645       Assessment/Plan: s/p Procedure(s): LAPAROTOMY, EXPLORATORY; REDUCTION AND REPAIR OF INTERNAL HERNIA (N/A)  Hx of robotic gastrojejunostomy 07/03/24 for GOO related to duodenal mass involving the pancreatic head Internal hernia  POD6 S/P ex-lap with reduction and repair of internal hernia defect Dr. Teresa on 11/16 - adv to full liquids.  - Reported to require cortrak/TF's prior to surgery. May need this replaced/restarted for supplemental nutrition - per medicine team. Ok to start.  - On abx for aspiration pna. Okay to stop from our standpoint.  - Mobilize as tolerated. PT - Pulm toilet   FEN: FLD, boost, IVF per [facility] VTE: SCDs,  SQH ID: Zosyn  11/16 Foley: present prior to surgery for retention, per primary, okay to remove from our standpoint    - per [facility] -  Hx of prostate CA Tobacco use HTN GERD Shingles with encephalitis Recent non-traumatic SAH VDRF Urinary retention with foley present  Adv diet      LOS: 6 days    Noah DELENA Shipper MD  09/30/2024

## 2024-09-30 NOTE — Plan of Care (Signed)

## 2024-10-01 DIAGNOSIS — K56609 Unspecified intestinal obstruction, unspecified as to partial versus complete obstruction: Secondary | ICD-10-CM | POA: Diagnosis not present

## 2024-10-01 LAB — BASIC METABOLIC PANEL WITH GFR
Anion gap: 10 (ref 5–15)
BUN: 8 mg/dL (ref 8–23)
CO2: 23 mmol/L (ref 22–32)
Calcium: 7.5 mg/dL — ABNORMAL LOW (ref 8.9–10.3)
Chloride: 95 mmol/L — ABNORMAL LOW (ref 98–111)
Creatinine, Ser: 0.42 mg/dL — ABNORMAL LOW (ref 0.61–1.24)
GFR, Estimated: >60 mL/min (ref >60)
Glucose, Bld: 91 mg/dL (ref 70–99)
Potassium: 2.9 mmol/L — ABNORMAL LOW (ref 3.5–5.1)
Sodium: 128 mmol/L — ABNORMAL LOW (ref 135–145)

## 2024-10-01 LAB — GLUCOSE, CAPILLARY
Glucose-Capillary: 117 mg/dL — ABNORMAL HIGH (ref 70–99)
Glucose-Capillary: 117 mg/dL — ABNORMAL HIGH (ref 70–99)
Glucose-Capillary: 97 mg/dL (ref 70–99)

## 2024-10-01 MED ORDER — POTASSIUM CHLORIDE 10 MEQ/100ML IV SOLN
10.0000 meq | INTRAVENOUS | Status: AC
  Administered 2024-10-01 (×4): 10 meq via INTRAVENOUS
  Filled 2024-10-01 (×4): qty 100

## 2024-10-01 NOTE — Progress Notes (Signed)
 7 Days Post-Op   Subjective/Chief Complaint: Patient does not like the food.  Denies abdominal pain   Objective: Vital signs in last 24 hours: Temp:  [97.4 F (36.3 C)-98.1 F (36.7 C)] 98.1 F (36.7 C) (11/23 0739) Pulse Rate:  [59-85] 59 (11/23 0739) Resp:  [15-16] 16 (11/23 0739) BP: (122-133)/(67-73) 133/72 (11/23 0739) SpO2:  [99 %-100 %] 100 % (11/23 0739) Last BM Date : 09/29/24  Intake/Output from previous day: 11/22 0701 - 11/23 0700 In: 365.3 [P.O.:120; IV Piggyback:245.3] Out: 1175 [Urine:1175] Intake/Output this shift: No intake/output data recorded.  Abdomen: Incision clean dry intact soft nontender staples in place  Lab Results:  Recent Labs    09/29/24 0912  WBC 6.2  HGB 12.6*  HCT 36.5*  PLT 234   BMET Recent Labs    09/30/24 0930 10/01/24 0600  NA 129* 128*  K 2.9* 2.9*  CL 96* 95*  CO2 23 23  GLUCOSE 81 91  BUN 7* 8  CREATININE 0.45* 0.42*  CALCIUM  7.2* 7.5*   PT/INR No results for input(s): LABPROT, INR in the last 72 hours. ABG No results for input(s): PHART, HCO3 in the last 72 hours.  Invalid input(s): PCO2, PO2  Studies/Results: DG CHEST PORT 1 VIEW Result Date: 09/30/2024 CLINICAL DATA:  Chest pain EXAM: PORTABLE CHEST 1 VIEW COMPARISON:  09/24/2024 FINDINGS: Lung apices and upper mediastinum obscured by the patient's face. Subtle left base atelectasis or infiltrate. Subtle patchy airspace disease noted right upper lung. Interstitial markings are diffusely coarsened with chronic features. The cardiopericardial silhouette is within normal limits for size. Telemetry leads overlie the chest. IMPRESSION: Subtle patchy airspace disease right upper lung and left base. Imaging features could be related to atelectasis or pneumonia. Electronically Signed   By: Camellia Candle M.D.   On: 09/30/2024 09:28    Anti-infectives: Anti-infectives (From admission, onward)    Start     Dose/Rate Route Frequency Ordered Stop   09/26/24  1400  piperacillin -tazobactam (ZOSYN ) IVPB 3.375 g        3.375 g 12.5 mL/hr over 240 Minutes Intravenous Every 8 hours 09/26/24 1227 09/29/24 0214   09/25/24 0000  piperacillin -tazobactam (ZOSYN ) IVPB 3.375 g  Status:  Discontinued        3.375 g 12.5 mL/hr over 240 Minutes Intravenous Every 8 hours 09/24/24 1834 09/26/24 0940   09/24/24 1645  piperacillin -tazobactam (ZOSYN ) IVPB 3.375 g        3.375 g 100 mL/hr over 30 Minutes Intravenous Every 8 hours 09/24/24 1634 09/24/24 1645       Assessment/Plan: s/p Procedure(s): LAPAROTOMY, EXPLORATORY; REDUCTION AND REPAIR OF INTERNAL HERNIA (N/A) Hx of robotic gastrojejunostomy 07/03/24 for GOO related to duodenal mass involving the pancreatic head Internal hernia  POD7 S/P ex-lap with reduction and repair of internal hernia defect Dr. Teresa on 11/16 - Soft diet - Reported to require cortrak/TF's prior to surgery. May need this replaced/restarted for supplemental nutrition - per medicine team. Ok to start.  - On abx for aspiration pna. Okay to stop from our standpoint.  - Mobilize as tolerated. PT - Pulm toilet  -Stable surgically for placement for discharge when medically. FEN: FLD, boost, IVF per [facility] VTE: SCDs,  SQH ID: Zosyn  11/16 Foley: present prior to surgery for retention, per primary, okay to remove from our standpoint    - per [facility] -  Hx of prostate CA Tobacco use HTN GERD Shingles with encephalitis Recent non-traumatic SAH VDRF Urinary retention with foley present   LOS: 7  days    [name] MD 10/01/2024

## 2024-10-01 NOTE — Progress Notes (Signed)
 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   Abdominal distention workup showed finding concerning for bowel ischemia in the setting of a prior gastrojejunostomy.    Imaging showed evidence of free peritoneal air with multiple mid to distal dilated small bowel with moderate diffuse pneumatosis.  Otherwise there is no PE, moderate bilateral pleural effusion was appreciated. On 11/16 patient was taken to the OR for ischemic bowel with perforation and underwent ex lap with reduction and repair of internal hernia with postop findings of internal hernia containing small bowel, and diffuse pneumatosis intestinalis   11/16 admitted to the ICU while on invasive mechanical ventilation.  Noted aspiration pneumonia on right upper lung.  11/17 patient extubated to nasal cannula.  11/18 transferred to [facility].   Assessment & Plan:  Principal Problem:   SBO (small bowel obstruction) (HCC) Active Problems:   Right upper lobe pneumonia   Essential hypertension   Hyponatremia   Subarachnoid hemorrhage, nontraumatic (HCC)   Pressure injury of skin   Protein-calorie malnutrition, severe   SBO (small bowel obstruction) 11/16 S/p exploratory laparotomy with reduction and repair of internal hernia by Dr Teresa.  General surgery on board. Prn analgesics   Post operative ileus NG tube has been  removed Patient is passing gas and had a BM On a soft diet now   Right upper lobe possible bacterial pneumonia Post operative respiratory failure, now resolved.  Patient is not requiring any supplemental oxygen now   Right upper lobe aspiration pneumonia  11/16 follow up chest radiograph with no frank infiltrate.  Finished course of intravenous Zosyn  Continue to mobilize, PT and OT.   Severe hypokalemia: repleted on 11/21 as well as 11/22 Ordered repletion today as well as K is 2.9. Ordered a total of 40 meq IV KCL. Telemetry monitoring  Hypophosphatemia:prn repletion   Essential hypertension Continue blood pressure monitoring.  Holding on antihypertensive agents for now due to risk of hypotension    Hyponatremia, continue to monitor closely. Likely hypovolemic secondary to poor oral intake.    Subarachnoid hemorrhage, nontraumatic  Continue pt and ot    Pressure injury of skin Continue local skin care   Protein-calorie malnutrition, severe  Disposition: Likely back to CIR, once medically stable.     DVT prophylaxis: heparin  injection 5,000 Units Start: 09/26/24 1400 SCDs Start: 09/24/24 1816     Code Status: Full Code Family Communication: None at the bedside Status is: Inpatient Remains inpatient appropriate because: post op ileus    Subjective:  No acute events overnight.  Denies chest pain or shortness of breath. He had an episode of chest pain yesterday evening but resolved shortly. Unremarkable EKG, Trop and CXR.  Examination:  General exam: Appears calm and comfortable, slightly sleepy  Respiratory system: Clear to auscultation. Respiratory effort normal. Cardiovascular system: S1 & S2 heard, RRR. No JVD, murmurs, rubs, gallops or clicks. No pedal edema. Gastrointestinal system: Abdominal dressing in place, slight peri-incisional tenderness present but no evidence  of discharge Central nervous system: Alert and oriented. No focal neurological  deficits. Extremities: Symmetric 5 x 5 power. Skin: No rashes, lesions or ulcers   Wound 09/24/24 1756 Pressure Injury Sacrum Medial Stage 1 -  Intact skin with non-blanchable redness of a localized area usually over a bony prominence. (Active)     Diet Orders (From admission, onward)     Start     Ordered   09/30/24 1208  DIET SOFT Room service appropriate? Yes; Fluid consistency: Thin  Diet effective now       Question Answer Comment  Room service appropriate? Yes   Fluid consistency: Thin      09/30/24 1207            Objective: Vitals:   09/30/24 1436 09/30/24 1948 10/01/24 0328 10/01/24 0739  BP: 122/68 130/73 126/67 133/72  Pulse: 78 85 82 (!) 59  Resp: 16  15 16   Temp: (!) 97.4 F (36.3 C) 98 F (36.7 C) 97.6 F (36.4 C) 98.1 F (36.7 C)  TempSrc: Oral     SpO2: 99% 99% 100% 100%  Weight:      Height:        Intake/Output Summary (Last 24 hours) at 10/01/2024 0849 Last data filed at 10/01/2024 0300 Gross per 24 hour  Intake 365.3 ml  Output 1175 ml  Net -809.7 ml   Filed Weights   09/26/24 0437 09/27/24 0500 09/30/24 0500  Weight: 48.9 kg 47.1 kg 48.7 kg    Scheduled Meds:  Chlorhexidine  Gluconate Cloth  6 each Topical Daily   docusate sodium   100 mg Oral BID   feeding supplement (KATE FARMS STANDARD 1.4)  325 mL Oral BID BM   heparin  injection (subcutaneous)  5,000 Units Subcutaneous Q8H   lidocaine   1 patch Transdermal Q24H   multivitamin with minerals  1 tablet Oral Daily   pantoprazole  (PROTONIX ) IV  40 mg Intravenous Q24H   potassium chloride   40 mEq Oral Once   Continuous Infusions:  potassium chloride        Nutritional status Signs/Symptoms: severe muscle depletion, severe fat depletion Interventions: Refer to RD note for recommendations Body mass index is 16.81 kg/m.  Data Reviewed:   CBC: Recent Labs  Lab 09/24/24 1953 09/25/24 0436 09/25/24 0500 09/26/24 0353 09/29/24 0912  WBC 6.9  --  4.5 5.2 6.2  NEUTROABS 6.3   --   --   --  5.0  HGB 12.9* 10.5* 11.2* 10.7* 12.6*  HCT 36.6* 31.0* 31.9* 30.4* 36.5*  MCV 88.0  --  88.6 89.4 88.8  PLT 281  --  225 237 234   Basic Metabolic Panel: Recent Labs  Lab 09/24/24 1953 09/25/24 0436 09/25/24 0500 09/26/24 0353 09/27/24 0442 09/29/24 0912 09/29/24 1130 09/30/24 0930 10/01/24 0600  NA 132*   < > 133* 134* 135 130*  --  129* 128*  K 4.0   < > 4.2 3.3* 3.1* 2.6*  --  2.9* 2.9*  CL 96*  --  98 99 98 93*  --  96* 95*  CO2 21*  --  25 24 24 26   --  23 23  GLUCOSE 182*  --  98 76 102* 107*  --  81 91  BUN 23  --  23 19 18 11   --  7* 8  CREATININE 0.53*  --  0.56* 0.61 0.76 0.53*  --  0.45* 0.42*  CALCIUM  7.5*  --  7.5* 7.3* 7.4* 7.2*  --  7.2* 7.5*  MG 1.6*  --  1.6* 1.8 2.0  --  1.6* 1.8  --   PHOS 4.4  --  4.3  --   --   --   --  2.1*  --    < > = values in this interval not displayed.   GFR: Estimated Creatinine Clearance: 49.9 mL/min (A) (by C-G formula based on SCr of 0.42 mg/dL (L)). Liver Function Tests: Recent Labs  Lab 09/24/24 1953  AST 19  ALT 34  ALKPHOS 54  BILITOT 1.1  PROT 4.7*  ALBUMIN  2.5*   No results for input(s): LIPASE, AMYLASE in the last 168 hours. No results for input(s): AMMONIA in the last 168 hours. Coagulation Profile: No results for input(s): INR, PROTIME in the last 168 hours. Cardiac Enzymes: No results for input(s): CKTOTAL, CKMB, CKMBINDEX, TROPONINI in the last 168 hours. BNP (last 3 results) No results for input(s): PROBNP in the last 8760 hours. HbA1C: No results for input(s): HGBA1C in the last 72 hours. CBG: Recent Labs  Lab 09/29/24 1350 09/29/24 2309 09/30/24 0615 09/30/24 1639 09/30/24 2336  GLUCAP 116* 98 90 100* 117*   Lipid Profile: No results for input(s): CHOL, HDL, LDLCALC, TRIG, CHOLHDL, LDLDIRECT in the last 72 hours.  Thyroid Function Tests: No results for input(s): TSH, T4TOTAL, FREET4, T3FREE, THYROIDAB in the last 72 hours. Anemia  Panel: No results for input(s): VITAMINB12, FOLATE, FERRITIN, TIBC, IRON, RETICCTPCT in the last 72 hours. Sepsis Labs: Recent Labs  Lab 09/24/24 1851 09/24/24 2138  LATICACIDVEN 1.2 2.1*    Recent Results (from the past 240 hours)  MRSA Next Gen by PCR, Nasal     Status: None   Collection Time: 09/24/24  6:15 PM   Specimen: Nasal Mucosa; Nasal Swab  Result Value Ref Range Status   MRSA by PCR Next Gen NOT DETECTED NOT DETECTED Final    Comment: (NOTE) The GeneXpert MRSA Assay (FDA approved for NASAL specimens only), is one component of a comprehensive MRSA colonization surveillance program. It is not intended to diagnose MRSA infection nor to guide or monitor treatment for MRSA infections. Test performance is not FDA approved in patients less than 71 years old. Performed at [facility], [address] [place], [place] [address]          Radiology Studies: DG CHEST PORT 1 VIEW Result Date: 09/30/2024 CLINICAL DATA:  Chest pain EXAM: PORTABLE CHEST 1 VIEW COMPARISON:  09/24/2024 FINDINGS: Lung apices and upper mediastinum obscured by the patient's face. Subtle left base atelectasis or infiltrate. Subtle patchy airspace disease noted right upper lung. Interstitial markings are diffusely coarsened with chronic features. The cardiopericardial silhouette is within normal limits for size. Telemetry leads overlie the chest. IMPRESSION: Subtle patchy airspace disease right upper lung and left base. Imaging features could be related to atelectasis or pneumonia. Electronically Signed   By: Camellia Candle M.D.   On: 09/30/2024 09:28           LOS: 7 days   Time spent= 40 mins    Deliliah Room, MD [facility]  If 7PM-7AM, please contact night-coverage  10/01/2024, 8:49 AM

## 2024-10-02 LAB — BASIC METABOLIC PANEL WITH GFR
Anion gap: 15 (ref 5–15)
BUN: 10 mg/dL (ref 8–23)
CO2: 22 mmol/L (ref 22–32)
Calcium: 8.3 mg/dL — ABNORMAL LOW (ref 8.9–10.3)
Chloride: 94 mmol/L — ABNORMAL LOW (ref 98–111)
Creatinine, Ser: 0.65 mg/dL (ref 0.61–1.24)
GFR, Estimated: >60 mL/min
Glucose, Bld: 92 mg/dL (ref 70–99)
Potassium: 3.6 mmol/L (ref 3.5–5.1)
Sodium: 131 mmol/L — ABNORMAL LOW (ref 135–145)

## 2024-10-02 LAB — GLUCOSE, CAPILLARY
Glucose-Capillary: 84 mg/dL (ref 70–99)
Glucose-Capillary: 77 mg/dL (ref 70–99)

## 2024-10-02 MED ORDER — PANTOPRAZOLE SODIUM 40 MG PO TBEC
40.0000 mg | DELAYED_RELEASE_TABLET | Freq: Two times a day (BID) | ORAL | Status: DC
  Administered 2024-10-02 – 2024-10-06 (×3): 40 mg via ORAL
  Filled 2024-10-02 (×7): qty 1

## 2024-10-02 NOTE — Progress Notes (Signed)
 Physical Therapy Treatment Patient Details Name: Noah Burnett MRN: [id number] DOB: 02/10/1941 Today's Date: 10/02/2024   History of Present Illness 83 y/o M presenting from CIR on 11/16 with acute respiratory hypoxia and abdominal distention, found to have ischemia bowel perforation s/p ex lap with reduction and repair of internal hernia on 09/24/2024    PMH includes duodenal mass s/p robotic gastrojejunostomy, nontraumatic SAH (Simultaneous filing. User may not have seen previous data.)    PT Comments  Pt agreeable to PT session and demonstrating steady progress towards acute goals. Pt continues to be limited in safe mobility by decreased activity tolerance, global weakness, pain, and impaired balance/postural reactions, pt requiring grossly min A to complete bed mobility and transfers sit<>stand. Pt progressing gait distance slightly with RW for support and CGA for safety, however distance limited to pt stated tolerance due to fatigue and pain. Educated pt on importance of frequent mobilization to maximize functional mobility gains with pt verbalizing understanding. Pt continues to benefit from skilled PT services to progress toward functional mobility goals.     If plan is discharge home, recommend the following: Assistance with cooking/housework;Assist for transportation;Help with stairs or ramp for entrance;A lot of help with walking and/or transfers   Can travel by private vehicle        Equipment Recommendations  None recommended by PT    Recommendations for Other Services       Precautions / Restrictions Precautions Precautions: Fall (Simultaneous filing. User may not have seen previous data.) Recall of Precautions/Restrictions: Impaired (Simultaneous filing. User may not have seen previous data.) Restrictions Weight Bearing Restrictions Per Provider Order: No (Simultaneous filing. User may not have seen previous data.)     Mobility  Bed Mobility Overal bed mobility: Needs  Assistance (Simultaneous filing. User may not have seen previous data.) Bed Mobility: Sidelying to Sit   Sidelying to sit: Min assist       General bed mobility comments: min A to elevate trunk from sidelying>sit (Simultaneous filing. User may not have seen previous data.)    Transfers Overall transfer level: Needs assistance (Simultaneous filing. User may not have seen previous data.) Equipment used: Rolling walker (2 wheels) (Simultaneous filing. User may not have seen previous data.) Transfers: Sit to/from Stand, Bed to chair/wheelchair/BSC (Simultaneous filing. User may not have seen previous data.) Sit to Stand: Min assist (Simultaneous filing. User may not have seen previous data.)           General transfer comment: min A to steady on rise (Simultaneous filing. User may not have seen previous data.)    Ambulation/Gait Ambulation/Gait assistance: Contact guard assist Gait Distance (Feet): 55 Feet Assistive device: Rolling walker (2 wheels) Gait Pattern/deviations: Step-through pattern, Decreased stride length, Trunk flexed, Narrow base of support Gait velocity: decreased     General Gait Details: pt with low shuffling steps with flexed trunk, CGA for safety, pt limited due to pain.   Stairs             Wheelchair Mobility     Tilt Bed    Modified Rankin (Stroke Patients Only)       Balance Overall balance assessment: Needs assistance (Simultaneous filing. User may not have seen previous data.) Sitting-balance support: No upper extremity supported, Feet supported (Simultaneous filing. User may not have seen previous data.) Sitting balance-Leahy Scale: Fair (Simultaneous filing. User may not have seen previous data.) Sitting balance - Comments: flexed posture 2/2 abd pain (Simultaneous filing. User may not have seen previous data.)  Standing balance support: Bilateral upper extremity supported, During functional activity, Reliant on assistive device for  balance (Simultaneous filing. User may not have seen previous data.) Standing balance-Leahy Scale: Poor (Simultaneous filing. User may not have seen previous data.) Standing balance comment: no overt LOB, realiant on RW support (Simultaneous filing. User may not have seen previous data.)                            Communication Communication Communication: Impaired (Simultaneous filing. User may not have seen previous data.) Factors Affecting Communication: Hearing impaired (Simultaneous filing. User may not have seen previous data.)  Cognition Arousal: Alert (Simultaneous filing. User may not have seen previous data.) Behavior During Therapy: Flat affect (Simultaneous filing. User may not have seen previous data.)   PT - Cognitive impairments: Safety/Judgement Difficult to assess due to: Impaired communication                     PT - Cognition Comments: HOH hears better on R ear. Following commands: Intact (Simultaneous filing. User may not have seen previous data.) Following commands impaired: Follows one step commands with increased time (Simultaneous filing. User may not have seen previous data.)    Cueing Cueing Techniques: Verbal cues, Gestural cues (Simultaneous filing. User may not have seen previous data.)  Exercises      General Comments General comments (skin integrity, edema, etc.): VSS on RW. Pt required encouragement for engagement in therapy session.      Pertinent Vitals/Pain Pain Assessment Pain Assessment: Faces (Simultaneous filing. User may not have seen previous data.) Faces Pain Scale: Hurts little more (Simultaneous filing. User may not have seen previous data.) Pain Location: low back (Simultaneous filing. User may not have seen previous data.) Pain Descriptors / Indicators: Discomfort, Grimacing, Guarding (Simultaneous filing. User may not have seen previous data.) Pain Intervention(s): Monitored during session, Limited activity within  patient's tolerance (Simultaneous filing. User may not have seen previous data.)    Home Living                          Prior Function            PT Goals (current goals can now be found in the care plan section) Acute Rehab PT Goals Patient Stated Goal: Back to rehab and finish goals there PT Goal Formulation: With patient Time For Goal Achievement: 10/10/24 Progress towards PT goals: Progressing toward goals    Frequency    Min 3X/week      PT Plan      Co-evaluation              AM-PAC PT 6 Clicks Mobility   Outcome Measure  Help needed turning from your back to your side while in a flat bed without using bedrails?: A Little Help needed moving from lying on your back to sitting on the side of a flat bed without using bedrails?: A Lot Help needed moving to and from a bed to a chair (including a wheelchair)?: A Little Help needed standing up from a chair using your arms (e.g., wheelchair or bedside chair)?: A Little Help needed to walk in hospital room?: A Little Help needed climbing 3-5 steps with a railing? : Total 6 Click Score: 15    End of Session Equipment Utilized During Treatment: Gait belt Activity Tolerance: Patient tolerated treatment well Patient left: in chair;with call bell/phone within reach;with family/visitor  present Nurse Communication: Mobility status PT Visit Diagnosis: Unsteadiness on feet (R26.81);Other abnormalities of gait and mobility (R26.89);Muscle weakness (generalized) (M62.81);History of falling (Z91.81);Difficulty in walking, not elsewhere classified (R26.2);Pain Pain - part of body:  (abdomen)     Time: 9149-9092 PT Time Calculation (min) (ACUTE ONLY): 17 min  Charges:    $Gait Training: 8-22 mins PT General Charges $$ ACUTE PT VISIT: 1 Visit                     Rand Boller R. PTA Acute Rehabilitation Services Office: [phone]   [name] 10/02/2024, 9:22 AM

## 2024-10-02 NOTE — Progress Notes (Addendum)
 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   Abdominal distention workup showed finding concerning for bowel ischemia in the setting of a prior gastrojejunostomy.    Imaging showed evidence of free peritoneal air with multiple mid to distal dilated small bowel with moderate diffuse pneumatosis.  Otherwise there is no PE, moderate bilateral pleural effusion was appreciated. On 11/16 patient was taken to the OR for ischemic bowel with perforation and underwent ex lap with reduction and repair of internal hernia with postop findings of internal hernia containing small bowel, and diffuse pneumatosis intestinalis   11/16 admitted to the ICU while on invasive mechanical ventilation.  Noted aspiration pneumonia on right upper lung.  11/17 patient extubated to nasal cannula.  11/18 transferred to [facility].   Doing well except poor oral intake Awaiting insurance auth to go back to CIR.  Assessment & Plan:  Principal Problem:   SBO (small bowel obstruction) (HCC) Active Problems:   Right upper lobe pneumonia   Essential hypertension   Hyponatremia   Subarachnoid hemorrhage, nontraumatic (HCC)   Pressure injury of skin   Protein-calorie malnutrition, severe   SBO (small bowel obstruction), resolved 11/16 S/p exploratory laparotomy with reduction and repair of internal hernia by Dr Teresa.   General surgery on board. Prn analgesics   Post operative ileus, resolved NG tube has been removed Patient is passing gas and having BMs On a soft diet now, tolerating well   Right upper lobe possible bacterial pneumonia Post operative respiratory failure, now resolved.  Patient is not requiring any supplemental oxygen now   Right upper lobe aspiration pneumonia  11/16 follow up chest radiograph with no frank infiltrate.  Finished course of intravenous Zosyn  Continue to mobilize, PT and OT.   Severe hypokalemia: prn repletion  Hypophosphatemia:prn repletion   Essential hypertension Continue blood pressure monitoring.  Holding on antihypertensive agents for now due to risk of hypotension    Hyponatremia, continue to monitor closely. Likely hypovolemic secondary to poor oral intake.    Subarachnoid hemorrhage, nontraumatic  Continue PT and OT   Pressure injury of skin Continue local skin care   Protein-calorie malnutrition, severe Ordered calorie count Poor oral intake Unwilling to have NGT or cortrack placement. TPN is a temporary option.  Will get palliative on board.  Disposition: Back to CIR, awaiting insurance auth.   DVT prophylaxis: heparin  injection 5,000 Units Start: 09/26/24 1400 SCDs Start: 09/24/24 1816     Code Status: Full Code Family Communication: Daughter is at the bedside Status is: Inpatient Remains inpatient appropriate because: post op ileus, pending CIR placement    Subjective:  No acute events overnight.  Denies chest pain or shortness of breath. He was sitting in the chair and was trimming his beard with a trimmer. His daughter was present at the bedside.  Examination:  General exam: Appears calm and comfortable, sitting in the chair Respiratory system: Clear to auscultation. Respiratory effort normal. Cardiovascular system: S1 & S2 heard, RRR.  No JVD, murmurs, rubs, gallops or clicks. No pedal edema. Gastrointestinal system:  Abdominal dressing in place, slight peri-incisional tenderness present but no evidence of discharge Central nervous system: Alert and oriented. No focal neurological deficits. Extremities: Symmetric 5 x 5 power. Skin: No rashes, lesions or ulcers   Wound 09/24/24 1756 Pressure Injury Sacrum Medial Stage 1 -  Intact skin with non-blanchable redness of a localized area usually over a bony prominence. (Active)     Diet Orders (From admission, onward)     Start     Ordered   09/30/24 1208  DIET SOFT Room service appropriate? Yes; Fluid consistency: Thin  Diet effective now       Question Answer Comment  Room service appropriate? Yes   Fluid consistency: Thin      09/30/24 1207            Objective: Vitals:   10/01/24 1420 10/01/24 1929 10/02/24 0316 10/02/24 0732  BP: 110/70 129/79 118/84 117/76  Pulse: 72 84 79 82  Resp: 16 15  18   Temp: 97.7 F (36.5 C) 98 F (36.7 C) 97.7 F (36.5 C) 98.1 F (36.7 C)  TempSrc: Oral     SpO2: 99% 99% 98% 100%  Weight:      Height:        Intake/Output Summary (Last 24 hours) at 10/02/2024 1120 Last data filed at 10/02/2024 1037 Gross per 24 hour  Intake 400 ml  Output 1475 ml  Net -1075 ml   Filed Weights   09/26/24 0437 09/27/24 0500 09/30/24 0500  Weight: 48.9 kg 47.1 kg 48.7 kg    Scheduled Meds:  Chlorhexidine  Gluconate Cloth  6 each Topical Daily   docusate sodium   100 mg Oral BID   feeding supplement ([employer] STANDARD 1.4)  325 mL Oral BID BM   heparin  injection (subcutaneous)  5,000 Units Subcutaneous Q8H   lidocaine   1 patch Transdermal Q24H   multivitamin with minerals  1 tablet Oral Daily   pantoprazole   40 mg Oral BID   potassium chloride   40 mEq Oral Once   Continuous Infusions:     Nutritional status Signs/Symptoms: severe muscle depletion, severe fat depletion Interventions: Refer to RD note for recommendations Body mass index is 16.81 kg/m.  Data Reviewed:   CBC: Recent Labs  Lab  09/26/24 0353 09/29/24 0912  WBC 5.2 6.2  NEUTROABS  --  5.0  HGB 10.7* 12.6*  HCT 30.4* 36.5*  MCV 89.4 88.8  PLT 237 234   Basic Metabolic Panel: Recent Labs  Lab 09/26/24 0353 09/27/24 0442 09/29/24 0912 09/29/24 1130 09/30/24 0930 10/01/24 0600  NA 134* 135 130*  --  129* 128*  K 3.3* 3.1* 2.6*  --  2.9* 2.9*  CL 99 98 93*  --  96* 95*  CO2 24 24 26   --  23 23  GLUCOSE 76 102* 107*  --  81 91  BUN 19 18 11   --  7* 8  CREATININE 0.61 0.76 0.53*  --  0.45* 0.42*  CALCIUM  7.3* 7.4* 7.2*  --  7.2* 7.5*  MG 1.8 2.0  --  1.6* 1.8  --   PHOS  --   --   --   --  2.1*  --    GFR: Estimated Creatinine Clearance: 49.9 mL/min (A) (by C-G formula based on SCr of 0.42 mg/dL (L)). Liver Function Tests: No results for input(s): AST, ALT, ALKPHOS, BILITOT, PROT, ALBUMIN  in the last 168 hours.  No results for  input(s): LIPASE, AMYLASE in the last 168 hours. No results for input(s): AMMONIA in the last 168 hours. Coagulation Profile: No results for input(s): INR, PROTIME in the last 168 hours. Cardiac Enzymes: No results for input(s): CKTOTAL, CKMB, CKMBINDEX, TROPONINI in the last 168 hours. BNP (last 3 results) No results for input(s): PROBNP in the last 8760 hours. HbA1C: No results for input(s): HGBA1C in the last 72 hours. CBG: Recent Labs  Lab 09/30/24 1639 09/30/24 2336 10/01/24 1605 10/01/24 2325 10/02/24 0731  GLUCAP 100* 117* 117* 97 84   Lipid Profile: No results for input(s): CHOL, HDL, LDLCALC, TRIG, CHOLHDL, LDLDIRECT in the last 72 hours.  Thyroid Function Tests: No results for input(s): TSH, T4TOTAL, FREET4, T3FREE, THYROIDAB in the last 72 hours. Anemia Panel: No results for input(s): VITAMINB12, FOLATE, FERRITIN, TIBC, IRON, RETICCTPCT in the last 72 hours. Sepsis Labs: No results for input(s): PROCALCITON, LATICACIDVEN in the last 168 hours.   Recent Results (from the past 240  hours)  MRSA Next Gen by PCR, Nasal     Status: None   Collection Time: 09/24/24  6:15 PM   Specimen: Nasal Mucosa; Nasal Swab  Result Value Ref Range Status   MRSA by PCR Next Gen NOT DETECTED NOT DETECTED Final    Comment: (NOTE) The GeneXpert MRSA Assay (FDA approved for NASAL specimens only), is one component of a comprehensive MRSA colonization surveillance program. It is not intended to diagnose MRSA infection nor to guide or monitor treatment for MRSA infections. Test performance is not FDA approved in patients less than 41 years old. Performed at [facility], [address], [place], [place] [address]          Radiology Studies: No results found.          LOS: 8 days   Time spent= 40 mins    Deliliah Room, MD [facility]  If 7PM-7AM, please contact night-coverage  10/02/2024, 11:20 AM

## 2024-10-02 NOTE — Progress Notes (Signed)
 Inpatient Rehab Admissions Coordinator:    Cir following, Pt  medically stable and case sent to insurance today.   Leita Kleine, MS, CCC-SLP Rehab Admissions Coordinator  [phone] (celll) [phone] (office)

## 2024-10-02 NOTE — Progress Notes (Signed)
 Physical Medicine & Rehabilitation Consult Service  Pt discussed with rehab admissions coordinator. Chart has been reviewed. This is a 83 year old male who was admitted earlier this month to inpatient rehab after a nontraumatic subarachnoid hemorrhage secondary to varicella-zoster viral encephalitis.  Unfortunately, early during his rehab stay the patient began to decompensate with workup demonstrating hypoxia and free peritoneal air in the abdomen with multiple mid to distal dilated small bowel loops involved.  Findings were concerning for ischemic bowel.  Patient was emergently transferred to the surgical service where he underwent exploratory laparotomy with reduction and repair of internal hernia by Dr. Teresa.  He was placed on antibiotics for aspiration pneumonia.  Postoperative ileus.  Patient also with hypokalemia, hypophosphatemia, and hyponatremia.  Patient has been up with therapies and is min assist transfers 55 feet for gait, contact-guard assist with a rolling walker.    Home: Home Living Family/patient expects to be discharged to:: Inpatient rehab Living Arrangements: Alone Available Help at Discharge: Family Type of Home: House Home Access: Stairs to enter Secretary/administrator of Steps: 1 Entrance Stairs-Rails: None Home Layout: One level Bathroom Shower/Tub: Health Visitor: Handicapped height Bathroom Accessibility: Yes Home Equipment: Information systems manager, Agricultural Consultant (2 wheels), Wheelchair - manual  Lives With: Alone  Functional History: Prior Function Prior Level of Function : Needs assist, Driving, History of Falls (last six months) Mobility Comments: min A wtih RW per CIR notes ADLs Comments: ind with ADLs prior to admission, reports he was driving Functional Status:  Mobility: Bed Mobility Overal bed mobility: Needs Assistance (Simultaneous filing. User may not have seen previous data.) Bed Mobility: Sidelying to Sit Sidelying to sit: Min assist Supine  to sit: Supervision, HOB elevated Sit to supine: Min assist General bed mobility comments: min A to elevate trunk from sidelying>sit (Simultaneous filing. User may not have seen previous data.) Transfers Overall transfer level: Needs assistance (Simultaneous filing. User may not have seen previous data.) Equipment used: Rolling walker (2 wheels) (Simultaneous filing. User may not have seen previous data.) Transfers: Sit to/from Stand, Bed to chair/wheelchair/BSC (Simultaneous filing. User may not have seen previous data.) Sit to Stand: Min assist (Simultaneous filing. User may not have seen previous data.) Bed to/from chair/wheelchair/BSC transfer type:: Step pivot Step pivot transfers: Contact guard assist General transfer comment: min A to steady on rise (Simultaneous filing. User may not have seen previous data.) Ambulation/Gait Ambulation/Gait assistance: Contact guard assist Gait Distance (Feet): 55 Feet Assistive device: Rolling walker (2 wheels) Gait Pattern/deviations: Step-through pattern, Decreased stride length, Trunk flexed, Narrow base of support General Gait Details: pt with low shuffling steps with flexed trunk, CGA for safety, pt limited due to pain. Gait velocity: decreased Gait velocity interpretation: <1.8 ft/sec, indicate of risk for recurrent falls    ADL: ADL Overall ADL's : Needs assistance/impaired Eating/Feeding: Set up, Sitting (with use of built-up foam grips for utensils) Eating/Feeding Details (indicate cue type and reason): Pt eating lunch upon OT arrival with pt self-feeding using non-dominant Left hand due to decreased grasp/coordination in dominant R hand. Pt with significant spillage and difficulty feeding self with both hands without a built up grip. OT provided and trialled use of foam for built-up grip with pt demonstrating ability to self-feed with dominant R hand with limited spillage with use of built-up grip and pr reporting increased comfort and ease  of use Grooming: Wash/dry hands, Minimal assistance, Standing Grooming Details (indicate cue type and reason): Min A to complete grooming tasks standing at sink. Pt with  left and posterior sway in standing requiring increased multimodal cues for stability. Pt with forward propping on counter to assist in maintaining standing balance. Upper Body Bathing: Minimal assistance Lower Body Bathing: Moderate assistance Upper Body Dressing : Minimal assistance, Sitting Upper Body Dressing Details (indicate cue type and reason): gown for back Lower Body Dressing: Moderate assistance Toilet Transfer: Minimal assistance, Cueing for safety, Ambulation, Regular Toilet, Rolling walker (2 wheels) Toilet Transfer Details (indicate cue type and reason): Min A for ambualtion to commode in bathroom. Verbal cues for safety with management of RW and awareness of toilet prior to sitting. Toileting- Clothing Manipulation and Hygiene: Moderate assistance Toileting - Clothing Manipulation Details (indicate cue type and reason): Pt able to complete some posterior peri care, required completion of hygeine and clothing management in standing. Functional mobility during ADLs: Minimal assistance, Rolling walker (2 wheels)  Cognition: Cognition Orientation Level: Oriented X4 Cognition Arousal: Alert (Simultaneous filing. User may not have seen previous data.) Behavior During Therapy: Flat affect (Simultaneous filing. User may not have seen previous data.)   Assessment: 83 year old with recent subarachnoid hemorrhage Acute internal hernia requiring ex lap with reduction and repair Debility due to the above.   Plan:  This patient would benefit from acute inpatient rehab to address functional mobility and self-care. Additionally, the patient requires daily MD oversight of the active medical issues noted above. Projected goals would be modified independent to supervision with an ELOS of 7 days.  Dispo and social supports  appear appropriate.    Rehab Admissions Coordinator to follow up.    Arthea IVAR Gunther, MD, [facility] Physical Medicine & Rehabilitation Medical Director Rehabilitation Services 10/02/2024

## 2024-10-02 NOTE — Progress Notes (Signed)
 Pt will not drink the [employer] drinks that are ordered.  Pt states  I do NOT like them.  Notified MD.

## 2024-10-02 NOTE — Progress Notes (Signed)
 Nutrition Follow-up  DOCUMENTATION CODES:   Underweight, Severe malnutrition in context of chronic illness  INTERVENTION:   Continue GI soft diet, advance as tolerated Encourage PO intake  Continue [employer] 1.4 vanilla shake po BID  Continue MVI w/minerals daily  Recommend Palliatve care consult to establish GOC  If GOC change and patient amendable to Cortrak, and TF initiated, recommend: Goal: [employer] 1.4 (Standard) at 45 ml/hr with Pro-Source TF20 60 mL daily Recommend starting at trickle rate and slowly advancing to goal TF provides 87 g of protein, 1592 kcals and 777 mL of free water   NUTRITION DIAGNOSIS:   Severe Malnutrition related to chronic illness as evidenced by severe muscle depletion, severe fat depletion. - continues   GOAL:   Patient will meet greater than or equal to 90% of their needs - not met   MONITOR:   Diet advancement, Labs, Weight trends, Skin, I & O's  REASON FOR ASSESSMENT:   Consult Calorie Count  ASSESSMENT:   83 yo male admitted from CIR on 11/16 for acute respiratory failure with abdominal distention. Stat CT obtained and concerning for SB pneumatosis, ischemic bowel with free air. Pt take emergently to OR for ex lap requiring reduction and repair of internal hernia. 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 robotic gastrojejunostomy on 07/03/24 (required TPN), shingles with encephalitis, tobacco use, HTN, GERD, hx of prostate cancer, deafness (left ear), full dentures  Previous Admission (09/08/24): 10/31 Admitted for FTT, initial discussion re: starting TPN by MD (but TPN never started) 11/07 Cortrak placed, EN initiated 11/13 Admitted to CIR, continued TF via Cortrak  This Admission: 11/16 Admitted from CIR, NPO, OR: ex lap with reduction and repair of internal hernia 2/2 diffuse pneumatosis intestinalis, internal hernia containing SB 11/17 Extubated 11/18 NG Clamping trials initiated 11/19  - NGT removed   Re consulted for calorie count. Nutrition following patient since admission for severe malnutrition, and requiring Cortrak prior to surgery on 11/16. Pt has expressed in the past that he does not want a cortrak placed again. RD had long discussion with patient and daughter about pt's current nutrition status. Reviewed need for increased nutrition to help patient in rehab and that pt's PO intake alone ( only taking a few bites food at meals and intermittently consuming ONS) is insufficient and that supplemental nutrition would be beneficial for patient's progress. Pt against  replacement of NGT/cortrak and resumption of tube feeds at this time. Discussed with pt and daughter that  alternative to tube feeds would be TPN however that would not be long term solution and that supplemental tube feeds would be the best option. During visit, patient also expressed wanting to go home vs returning to CIR and that he is tired/exhausted from prolonged hospitalizations. Secure chat sent to attending MD and surgery PA about involving palliative care for to help establish GOC for patient.    Admit weight: 48.9 kg  Current weight: 48.7 kg  Nutritionally Relevant Medications: Coalce, MVI w/minerals, protonix   Labs Reviewed: Sodium 131, Cl 94, Calcium  8.3.     NUTRITION - FOCUSED PHYSICAL EXAM:  Flowsheet Row Most Recent Value  Orbital Region Severe depletion  Upper Arm Region Severe depletion  Thoracic and Lumbar Region Severe depletion  Buccal Region Severe depletion  Temple Region Severe depletion  Clavicle Bone Region Severe depletion  Clavicle and Acromion Bone Region Severe depletion  Scapular Bone Region Severe depletion  Dorsal Hand Moderate depletion  Patellar Region Severe depletion  Anterior Thigh Region Severe depletion  Posterior Calf Region Severe depletion  Edema (RD Assessment) None    Diet Order:   Diet Order             DIET SOFT Room service appropriate? Yes; Fluid  consistency: Thin  Diet effective now                   EDUCATION NEEDS:   Not appropriate for education at this time  Skin:  Skin Assessment: Skin Integrity Issues: Skin Integrity Issues:: Stage I, Incisions Stage I: sacrum Incisions: abdomen (closed)  Last BM:  11/21  Height:   Ht Readings from Last 1 Encounters:  09/24/24 5' 7.01 (1.702 m)    Weight:   Wt Readings from Last 1 Encounters:  09/30/24 48.7 kg     BMI:  Body mass index is 16.81 kg/m.  Estimated Nutritional Needs:   Kcal:  1500-1700 kcals  Protein:  75-90 g  Fluid:  >/= 1.5 L  Tomasita Beevers, MS, RD, LDN Clinical Dietitian  Contact via secure chat. If unavailable, use group chat RD Inpatient.

## 2024-10-02 NOTE — Progress Notes (Signed)
 Occupational Therapy Treatment Patient Details Name: Noah Burnett MRN: [id number] DOB: 1941-10-23 Today's Date: 10/02/2024   History of present illness 83 y/o M presenting from CIR on 11/16 with acute respiratory hypoxia and abdominal distention, found to have ischemia bowel perforation s/p ex lap with reduction and repair of internal hernia on 09/24/2024    PMH includes duodenal mass s/p robotic gastrojejunostomy, nontraumatic SAH   OT comments  Pt is progressing towards OT established goals. Focus of session on progressing functional mobility and increasing independence with ADL tasks. Pt required Min A for functional transfer to commode this session, and required up to Mod A for ADL task engagement. Pt was able to tolerate up to 4 minutes of OOB activity this session. Pt continues to progress towards goals and benefit from skilled OT services. OT to continue per POC. Current d/c recommendation remains appropriate.       If plan is discharge home, recommend the following:  A little help with walking and/or transfers;A lot of help with bathing/dressing/bathroom;Direct supervision/assist for financial management;Assistance with cooking/housework;Direct supervision/assist for medications management;Help with stairs or ramp for entrance;Supervision due to cognitive status   Equipment Recommendations  BSC/3in1    Recommendations for Other Services      Precautions / Restrictions Precautions Precautions: Fall Recall of Precautions/Restrictions: Impaired Restrictions Weight Bearing Restrictions Per Provider Order: No       Mobility Bed Mobility Overal bed mobility: Needs Assistance             General bed mobility comments: Pt greeted seated EOB, required verbal cues to scoot towards EOB to assist in sit to stand transfer    Transfers Overall transfer level: Needs assistance Equipment used: Rolling walker (2 wheels) Transfers: Sit to/from Stand Sit to Stand: Min assist            General transfer comment: Repeated verbal cues for proper hand placement on RW for sit to stand. Pt required verbal cues to scoot closer to EOB to facilitate transfer, increased ability to clear bed aftre scooting. Pt with kyphotic posture in standing.     Balance Overall balance assessment: Needs assistance Sitting-balance support: No upper extremity supported, Feet supported Sitting balance-Leahy Scale: Fair Sitting balance - Comments: flexed posture 2/2 abdominal pain Postural control: Other (comment) (anterior lean) Standing balance support: Bilateral upper extremity supported, During functional activity, Reliant on assistive device for balance Standing balance-Leahy Scale: Poor Standing balance comment: Pt dependent on RW and external support to maintain standing balance. Strong left and postural sway observed in standing with Min A.                           ADL either performed or assessed with clinical judgement   ADL Overall ADL's : Needs assistance/impaired     Grooming: Wash/dry hands;Minimal assistance;Standing Grooming Details (indicate cue type and reason): Min A to complete grooming tasks standing at sink. Pt with left and posterior sway in standing requiring increased multimodal cues for stability. Pt with forward propping on counter to assist in maintaining standing balance.                 Toilet Transfer: Minimal assistance;Cueing for safety;Ambulation;Regular Toilet;Rolling walker (2 wheels) Toilet Transfer Details (indicate cue type and reason): Min A for ambualtion to commode in bathroom. Verbal cues for safety with management of RW and awareness of toilet prior to sitting. Toileting- Clothing Manipulation and Hygiene: Moderate assistance Toileting - Clothing Manipulation Details (indicate  cue type and reason): Pt able to complete some posterior peri care, required completion of hygeine and clothing management in standing.             Extremity/Trunk Assessment Upper Extremity Assessment Upper Extremity Assessment: Right hand dominant;Generalized weakness;RUE deficits/detail;LUE deficits/detail RUE Deficits / Details: generalized weakness; decrease grasp (R>L); decreased fine motor coordination; noted mildly edematous in hand and wrist RUE Coordination: decreased fine motor LUE Deficits / Details: generalized weakness; decrease grasp (R>L); decreased fine motor coordination            Vision       Perception     Praxis     Communication Communication Communication: Impaired Factors Affecting Communication: Hearing impaired   Cognition Arousal: Lethargic Behavior During Therapy: Flat affect Cognition: Cognition impaired     Awareness: Intellectual awareness intact, Online awareness impaired   Attention impairment (select first level of impairment): Selective attention Executive functioning impairment (select all impairments): Problem solving, Reasoning OT - Cognition Comments: Increased time to follow one step commands                 Following commands: Impaired Following commands impaired: Follows one step commands with increased time      Cueing   Cueing Techniques: Verbal cues, Visual cues  Exercises      Shoulder Instructions       General Comments VSS on RW. Pt required encouragement for engagement in therapy session.    Pertinent Vitals/ Pain       Pain Assessment Pain Assessment: Faces Faces Pain Scale: Hurts even more Pain Location: back and abdomen Pain Descriptors / Indicators: Discomfort, Guarding, Grimacing Pain Intervention(s): Limited activity within patient's tolerance, Monitored during session  Home Living                                          Prior Functioning/Environment              Frequency  Min 2X/week        Progress Toward Goals  OT Goals(current goals can now be found in the care plan section)  Progress towards OT  goals: Progressing toward goals  Acute Rehab OT Goals Patient Stated Goal: to get home OT Goal Formulation: With patient Time For Goal Achievement: 10/10/24 Potential to Achieve Goals: Good ADL Goals Pt Will Perform Grooming: with supervision;sitting Pt Will Perform Upper Body Dressing: with contact guard assist;sitting;standing Pt Will Perform Lower Body Dressing: with contact guard assist;sit to/from stand;sitting/lateral leans Pt Will Transfer to Toilet: with contact guard assist;ambulating;regular height toilet Additional ADL Goal #1: pt will tolerate OOB standing activity x10 min in prep for OOB ADLs  Plan      Co-evaluation                 AM-PAC OT 6 Clicks Daily Activity     Outcome Measure   Help from another person eating meals?: A Little Help from another person taking care of personal grooming?: A Little Help from another person toileting, which includes using toliet, bedpan, or urinal?: A Lot Help from another person bathing (including washing, rinsing, drying)?: A Lot Help from another person to put on and taking off regular upper body clothing?: A Lot Help from another person to put on and taking off regular lower body clothing?: A Lot 6 Click Score: 14    End of Session Equipment Utilized During  Treatment: Gait belt;Rolling walker (2 wheels)  OT Visit Diagnosis: Unsteadiness on feet (R26.81);Other abnormalities of gait and mobility (R26.89);Muscle weakness (generalized) (M62.81)   Activity Tolerance Patient tolerated treatment well   Patient Left Other (comment) (Pt care handed off to PTA)   Nurse Communication          Time: 9157-9145 OT Time Calculation (min): 12 min  Charges: OT General Charges $OT Visit: 1 Visit OT Treatments $Self Care/Home Management : 8-22 mins  Maurilio CROME, OTR/L.  [facility] Acute Rehabilitation  Office: [phone]   Maurilio PARAS Shivan Hodes 10/02/2024, 9:21 AM

## 2024-10-02 NOTE — Progress Notes (Signed)
 8 Days Post-Op   Subjective/Chief Complaint: Sitting up in the chair. Reports poor appetite and early satiety, says he is drinking the [employer] protein shakes. +flatus and BMs, last BM yesterday. Denies nausea or vomiting.  States he wants to go home. Family member at bedside.   Objective: Vital signs in last 24 hours: Temp:  [97.7 F (36.5 C)-98.1 F (36.7 C)] 98.1 F (36.7 C) (11/24 0732) Pulse Rate:  [72-84] 82 (11/24 0732) Resp:  [15-18] 18 (11/24 0732) BP: (110-129)/(70-84) 117/76 (11/24 0732) SpO2:  [98 %-100 %] 100 % (11/24 0732) Last BM Date : 10/01/24  Intake/Output from previous day: 11/23 0701 - 11/24 0700 In: 400 [IV Piggyback:400] Out: 1200 [Urine:1200] Intake/Output this shift: Total I/O In: -  Out: 275 [Urine:275]  Gen: alert, up in chair, chronically ill appearing Abdomen: Incision clean dry intact soft nontender staples in place MSK: muscle wasting of the extremities  Lab Results:  No results for input(s): WBC, HGB, HCT, PLT in the last 72 hours.  BMET Recent Labs    09/30/24 0930 10/01/24 0600  NA 129* 128*  K 2.9* 2.9*  CL 96* 95*  CO2 23 23  GLUCOSE 81 91  BUN 7* 8  CREATININE 0.45* 0.42*  CALCIUM  7.2* 7.5*      Anti-infectives: Anti-infectives (From admission, onward)    Start     Dose/Rate Route Frequency Ordered Stop   09/26/24 1400  piperacillin -tazobactam (ZOSYN ) IVPB 3.375 g        3.375 g 12.5 mL/hr over 240 Minutes Intravenous Every 8 hours 09/26/24 1227 09/29/24 0214   09/25/24 0000  piperacillin -tazobactam (ZOSYN ) IVPB 3.375 g  Status:  Discontinued        3.375 g 12.5 mL/hr over 240 Minutes Intravenous Every 8 hours 09/24/24 1834 09/26/24 0940   09/24/24 1645  piperacillin -tazobactam (ZOSYN ) IVPB 3.375 g        3.375 g 100 mL/hr over 30 Minutes Intravenous Every 8 hours 09/24/24 1634 09/24/24 1645       Assessment/Plan: s/p Procedure(s): LAPAROTOMY, EXPLORATORY; REDUCTION AND REPAIR OF INTERNAL HERNIA  (N/A) 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 Teresa on 11/16 - Soft diet - Reported to require cortrak/TF's prior to surgery. May need this replaced/restarted for supplemental nutrition - per medicine team.  - On abx for aspiration pna. Okay to stop from our standpoint.  - Mobilize as tolerated. PT - Pulm toilet  -Stable surgically for placement for discharge when medically. FEN: soft, boost, IVF per [facility] VTE: SCDs,  SQH ID: [email or web address]  completed for aspiration PNA, no role for abx from surgical standpoint Foley: present prior to surgery for retention, per primary, okay to remove from our standpoint   Dispo: CIR vs home, I have ordered a calorie count for better assessment of nutritional status, but this could be continued at CIR. From a surgical standpoint he is stable for CIR.   - per [facility] -  Hx of prostate CA Tobacco use HTN GERD Shingles with encephalitis Recent non-traumatic SAH VDRF Urinary retention with foley present   LOS: 8 days    Almarie GORMAN Pringle MD 10/02/2024

## 2024-10-02 NOTE — Plan of Care (Signed)

## 2024-10-03 DIAGNOSIS — R638 Other symptoms and signs concerning food and fluid intake: Secondary | ICD-10-CM

## 2024-10-03 DIAGNOSIS — Z789 Other specified health status: Secondary | ICD-10-CM

## 2024-10-03 DIAGNOSIS — Z515 Encounter for palliative care: Secondary | ICD-10-CM

## 2024-10-03 DIAGNOSIS — Z7189 Other specified counseling: Secondary | ICD-10-CM

## 2024-10-03 LAB — GLUCOSE, CAPILLARY
Glucose-Capillary: 90 mg/dL (ref 70–99)
Glucose-Capillary: 76 mg/dL (ref 70–99)
Glucose-Capillary: 96 mg/dL (ref 70–99)
Glucose-Capillary: 101 mg/dL — ABNORMAL HIGH (ref 70–99)

## 2024-10-03 NOTE — Plan of Care (Signed)
  Problem: Education: Goal: Knowledge of General Education information will improve Description: Including pain rating scale, medication(s)/side effects and non-pharmacologic comfort measures Outcome: Progressing   Problem: Health Behavior/Discharge Planning: Goal: Ability to manage health-related needs will improve Outcome: Progressing   Problem: Clinical Measurements: Goal: Ability to maintain clinical measurements within normal limits will improve Outcome: Progressing Goal: Will remain free from infection Outcome: Progressing Goal: Diagnostic test results will improve Outcome: Progressing Goal: Respiratory complications will improve Outcome: Progressing Goal: Cardiovascular complication will be avoided Outcome: Progressing   Problem: Activity: Goal: Risk for activity intolerance will decrease Outcome: Progressing   Problem: Coping: Goal: Level of anxiety will decrease Outcome: Progressing   Problem: Elimination: Goal: Will not experience complications related to bowel motility Outcome: Progressing Goal: Will not experience complications related to urinary retention Outcome: Progressing   Problem: Pain Managment: Goal: General experience of comfort will improve and/or be controlled Outcome: Progressing   Problem: Safety: Goal: Ability to remain free from injury will improve Outcome: Progressing   Problem: Skin Integrity: Goal: Risk for impaired skin integrity will decrease Outcome: Progressing   Problem: Education: Goal: Ability to identify signs and symptoms of gastrointestinal bleeding will improve Outcome: Progressing   Problem: Bowel/Gastric: Goal: Will show no signs and symptoms of gastrointestinal bleeding Outcome: Progressing   Problem: Clinical Measurements: Goal: Complications related to the disease process, condition or treatment will be avoided or minimized Outcome: Progressing

## 2024-10-03 NOTE — Consult Note (Signed)
 Consultation Note Date: 10/03/2024   Patient Name: Noah Burnett  DOB: [birth year]  MRN: [id number]  Age / Sex: 83 y.o., male  PCP: [name], MD Referring Physician: Dino Antu, MD  Reason for Consultation: Establishing goals of care, poor oral intake, s/p bowel surgery, protein calorie malnutrition, unwilling to have cortrack/NGT, advanced age   HPI/Patient Profile: 83 y.o. 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  Patient was admitted to CIR earlier this month after nontraumatic subarachnoid hemorrhage secondary to varicella-zoster viral encephalitis.  Unfortunately, after admission to CIR he developed small bowel obstruction secondary to hernia and underwent an exploratory laparotomy with hernia repair on 11/16.  After surgery he was admitted to ICU for vent dependent respiratory failure and subsequently extubated on 11/17.  His hospital course has been complicated by postop ileus, right upper lobe aspiration pneumonia, severe protein calorie malnutrition, and severe hypokalemia.  His oral intake remains poor.  Of note, patient has had 3 admissions and 2 ED visits in the last 6 months.  He is a 7-day readmission.  Clinical Assessment and Goals of Care: I have reviewed medical records including: EPIC notes: Hospitalist, nursing, surgery, speech pathology, CIR admissions coordinator, PMR, PT/OT, TOC, dietitian Labs: Albumin  noted at 2.5 assessed for overall health, nutritional status, and disease severity, helping predict prognosis and guide care Advanced directives in ACP: None  Received report from primary RN -no acute concerns.  RN reports that patient is  unmotivated to participate in his care, wants to go home, continues with poor oral intake.  Went to visit patient at bedside - sister-in-law/Audrey present. Patient was lying in bed awake, alert, oriented, and able to participate in conversation.  He is eating breakfast -about 15% consumed during visit.  No signs or non-verbal gestures of pain or discomfort noted. No respiratory distress, increased work of breathing, or secretions noted.  Patient is frail appearing.   --------------------------------------------------------------------------------------------  Advance Care Planning Conversation   Pertinent diagnoses: Severe protein calorie malnutrition  The patient and/or family consented to a voluntary Advance Care Planning conversation in person and via phone. Individuals present for the conversation: Patient and sister-in-law/Audrey in person along with daughter/[name] via speaker phone  Summary of the conversation:   Met with patient, Gustav and [name] to discuss diagnosis, prognosis, GOC, EOL wishes, disposition, and options.  I introduced Palliative Medicine as specialized medical care for people living with serious illness. It focuses on providing relief from the symptoms and stress of a serious illness. The goal is to improve quality of life for both the patient and the family.  We discussed a brief life review of the patient as well as functional and nutritional status.  Patient has 2 children-1 daughter and 1 son.  Prior to hospitalization, he was living in a private residence alone but with family all around.  Gustav and [name] indicate that patient has a lot of family support.  At home, patient was described as being very independent.  He was mowing his grass, caring for himself, jumping in and out of the truck bed, and very active.  We discussed patient's current illness and what it means in the larger context of patient's on-going co-morbidities.  Patient and family have a clear  understanding of his current acute medical situation.  Therapeutic listening provided as patient and his family reflect on the medical issues he has had in the last several months.  Natural disease trajectory and expectations at EOL were discussed. I attempted to elicit values and goals of care important to the patient. The difference between aggressive medical intervention and comfort care was considered in light of the patient's goals of care.   Discussed that the goal of rehab is improvement/stabalization of functional status,  which can be a difficult goal to meet for patients with advanced illness and multiple medical conditions. Reviewed what is needed for someone to have a positive rehabilitation experience to include adequate nutritional intake as well as willingness/ability to participate.  Expressed concern that his current nutritional intake is not enough to support him long-term.  We discussed that poor oral intake is a poor prognostic indicator.  Patient denies nausea and no issues with his dentures; he simply reports early satiety.  He is very clear he is not interested in artificial nutrition but is motivated to try and increase his oral intake if he can have food that he enjoys (does not like the hospital food). His/family's ultimate goal is for him to hopefully regain strength and return home.  The difference between CIR versus home health support discussed in detail.  Patient is open for discharge to CIR per family request though really would prefer to go [facility] with [facility]. Outpatient palliative care explained and offered - patient and family accepting.   Concepts related to his high risk of rehospitalization were discussed-patient feels he would not want rehospitalization though family wish to make stepwise decisions pending his course in CIR.  Patient and family understand if he does not wish for rehospitalization hospice care is recommended -they expressed understanding.  Patient does have  HCPOA documents, listing daughter/[name] eyes primary and son/Philip as secondary surrogate decision makers.  Requested copy of documents if family are able to provide before his discharge.  Encouraged patient/family to consider DNR/DNI status understanding evidenced based poor outcomes in similar hospitalized patient, as the cause of arrest is likely associated with advanced chronic/terminal illness rather than an easily reversible acute cardio-pulmonary event.  I shared that even if we pursued resuscitation we would not able to resolve the underlying factors. I explained that DNR/DNI does not change the medical plan and it only comes into effect after a person has arrested (died).  It is a protective measure to keep us  from harming the patient in their last moments of life. Patient is not agreeable to DNR/DNI with understanding that he would receive CPR, defibrillation, ACLS medications, and/or intubation.  He was not open to discussing if he would want these interventions as a trial period vs long-term.  Discussed with patient/family the importance of continued conversation with each other and the medical providers regarding overall plan of care and treatment options, ensuring decisions are within the context of the patient's values and GOCs.    Questions and concerns were addressed. The patient/family was encouraged to call with questions and/or concerns. PMT card was provided.  Repositioned patient in bed per his request.    Outcome of the conversation: - Hopeful for discharge to CIR with ultimate goal to return home - Continue full code -Outpatient palliative care referral - No artificial feeding now or in the future  I spent 35 minutes providing separately identifiable ACP services with the patient and/or surrogate decision maker in a voluntary, in-person conversation discussing the patient's wishes and goals as detailed in the above note.  Primary Decision Maker: PATIENT    SUMMARY  OF RECOMMENDATIONS   Hopeful for discharge to CIR to regain strength with ultimate goal to return home Continue full code No artificial nutrition now or in the future Patient understands he is at high risk for decline if oral intake remains poor.  Patient indicates he would not want rehospitalization though he and his family will make stepwise decisions pending CIR progress Encourage oral intake.  If able in CIR, encouraged family to bring his favorite foods TOC notified and consulted for: Outpatient palliative care referral Obtain copy of HCPOA document if able PMT will continue to follow peripherally. If there are any imminent needs please call the service directly   Code Status/Advance Care Planning: Full code  Palliative Prophylaxis:  Bowel Regimen, Frequent Pain Assessment, and Turn Reposition  Additional Recommendations (Limitations, Scope, Preferences): Full Scope Treatment  Psycho-social/Spiritual:  Desire for further Chaplaincy support:no Created space and opportunity for patient and family to express thoughts and feelings regarding patient's current medical situation.  Emotional support and therapeutic listening provided.  Prognosis:  Unable to determine  Discharge Planning: To Be Determined      Primary Diagnoses: Present on Admission:  SBO (small bowel obstruction) (HCC)  Essential hypertension  Hyponatremia  Protein-calorie malnutrition, severe   I have reviewed the medical record, interviewed the patient and family, and examined the patient. The following aspects are pertinent.  Past Medical History:  Diagnosis Date   Arthritis    Bladder tumor    Deafness, left    Full dentures    GERD (gastroesophageal reflux disease)    History of [place] spotted fever    01-25-2020  per pt had in 2018 and was treated w/ no residual   Hyperplasia of prostate with lower urinary tract symptoms (LUTS)    Hypertension    followed by pcp   (01-25-2020  per pt  had stress test approx. 2005, told normal , done in [place] somewhere)   Pre-diabetes    followed by pcp ,   watches diet   Prostate cancer [facility]) urologist--- dr watt   dx by bx01-13-2020,  Gleason 3+4 ;  MRI 12-19-2018, vol 96.26;   active survillance   Shingles    Wears glasses    Wears hearing aid in right ear    Social History   Socioeconomic History   Marital status: Single    Spouse name: Not on file   Number of children: Not on file   Years of education: Not on file   Highest education level: Not on file  Occupational History   Not on file  Tobacco Use   Smoking status: Former    Current packs/day: 0.00    Average packs/day: 1 pack/day for 36.0 years (36.0 ttl pk-yrs)    Types: Cigarettes    Start date: 49    Quit date: 2000    Years since quitting: 25.9    Passive exposure: Past   Smokeless tobacco: Never  Vaping Use   Vaping status: Never Used  Substance and Sexual Activity   Alcohol use: Not Currently   Drug use: Never   Sexual activity: Not Currently    Comment: vasectomy  1970s  Other Topics Concern   Not on file  Social History Narrative   Not on file   Social Drivers of Health   Financial Resource Strain: Not on file  Food Insecurity: No Food Insecurity (09/21/2024)   Hunger Vital Sign    Worried About Running  Out of Food in the Last Year: Never true    Ran Out of Food in the Last Year: Never true  Transportation Needs: No Transportation Needs (09/21/2024)   PRAPARE - Administrator, Civil Service (Medical): No    Lack of Transportation (Non-Medical): No  Physical Activity: Not on file  Stress: Not on file  Social Connections: Unknown (09/08/2024)   Social Connection and Isolation Panel    Frequency of Communication with Friends and Family: More than three times a week    Frequency of Social Gatherings with Friends and Family: More than three times a week    Attends Religious Services: Not on file    Active Member of Clubs or  Organizations: Patient unable to answer    Attends Banker Meetings: Patient unable to answer    Marital Status: Patient declined   No family history on file. Scheduled Meds:  Chlorhexidine  Gluconate Cloth  6 each Topical Daily   docusate sodium   100 mg Oral BID   feeding supplement (KATE FARMS STANDARD 1.4)  325 mL Oral BID BM   heparin  injection (subcutaneous)  5,000 Units Subcutaneous Q8H   lidocaine   1 patch Transdermal Q24H   multivitamin with minerals  1 tablet Oral Daily   pantoprazole   40 mg Oral BID   potassium chloride   40 mEq Oral Once   Continuous Infusions: PRN Meds:.alum & mag hydroxide-simeth, benzonatate , docusate sodium , fentaNYL  (SUBLIMAZE ) injection, methocarbamol  (ROBAXIN ) injection, mouth rinse Medications Prior to Admission:  Prior to Admission medications   Medication Sig Start Date End Date Taking? Authorizing Provider  omeprazole  (PRILOSEC) 40 MG capsule Take 40 mg by mouth daily. 02/16/23  Yes [provider]  amLODipine  (NORVASC ) 5 MG tablet Take 1 tablet (5 mg total) by mouth daily. Patient not taking: Reported on 09/24/2024 09/22/24   Vernon Ranks, MD   Allergies  Allergen Reactions   Haemophilus Influenzae Vaccines Other (See Comments)    I get deathly sick   Review of Systems  Constitutional:  Positive for activity change, appetite change and fatigue.  Respiratory:  Negative for shortness of breath.   Gastrointestinal:  Negative for nausea and vomiting.  Neurological:  Positive for weakness.    Physical Exam Vitals and nursing note reviewed.  Constitutional:      General: He is not in acute distress.    Comments: Frail appearing  Pulmonary:     Effort: No respiratory distress.  Skin:    General: Skin is warm and dry.  Neurological:     Mental Status: He is alert and oriented to person, place, and time.     Motor: Weakness present.  Psychiatric:        Attention and Perception: Attention normal.        Mood and  Affect: Affect is blunt.        Behavior: Behavior is agitated. Behavior is cooperative.        Cognition and Memory: Cognition and memory normal.     Vital Signs: BP 119/75 (BP Location: Right Arm)   Pulse (!) 123   Temp 98.7 F (37.1 C)   Resp 18   Ht 5' 7.01 (1.702 m)   Wt 48.7 kg   SpO2 99%   BMI 16.81 kg/m  Pain Scale: 0-10 POSS *See Group Information*: 2-Acceptable,Slightly drowsy, easily aroused Pain Score: 0-No pain   SpO2: SpO2: 99 % O2 Device:SpO2: 99 % O2 Flow Rate: .O2 Flow Rate (L/min): 1 L/min  IO: Intake/output summary:  Intake/Output Summary (Last 24 hours) at 10/03/2024 9070 Last data filed at 10/02/2024 2000 Gross per 24 hour  Intake 480 ml  Output 400 ml  Net 80 ml    LBM: Last BM Date : 10/01/24 Baseline Weight: Weight: 48.9 kg Most recent weight: Weight: 48.7 kg     Palliative Assessment/Data: PPS 50%    Discussed case with: Dr. Dino, [facility], primary RN, patient, patient's family  Signed by: Jeoffrey CHRISTELLA Sharps, NP   Please contact Palliative Medicine Team phone at [phone] for questions and concerns.  For individual provider: See Amion  *Portions of this note are a verbal dictation therefore any spelling and/or grammatical errors are due to the Dragon Medical One system interpretation.

## 2024-10-03 NOTE — Progress Notes (Signed)
 PROGRESS NOTE    Noah Burnett  FMW:[id number] DOB: 1941-10-30 DOA: 09/24/2024 PCP: [name], MD   Brief Narrative:   83 year old male with history of prostate CVA, tobacco use, HTN, GERD, duodenal mass involving the pancreatic head s/p robotic gastrojejunostomy on 07/03/2024, with declining healthcare status including recent hospitalization for shingles and encephalitis.     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   Abdominal distention workup showed finding concerning for bowel ischemia in the setting of a prior gastrojejunostomy.    Imaging showed evidence of free peritoneal air with multiple mid to distal dilated small bowel with moderate diffuse pneumatosis.  Otherwise there is no PE, moderate bilateral pleural effusion was appreciated. On 11/16 patient was taken to the OR for ischemic bowel with perforation and underwent ex lap with reduction and repair of internal hernia with postop findings of internal hernia containing small bowel, and diffuse pneumatosis intestinalis   11/16 admitted to the ICU while on invasive mechanical ventilation.  Noted aspiration pneumonia on right upper lung.  11/17 patient extubated to nasal cannula.  11/18 transferred to [facility].   Doing well except poor oral intake Awaiting insurance auth to go back to CIR. Palliative consulted.  Assessment & Plan:  Principal Problem:   SBO (small bowel obstruction) (HCC) Active Problems:   Right upper lobe pneumonia   Essential hypertension   Hyponatremia   Subarachnoid hemorrhage, nontraumatic (HCC)   Pressure injury of skin   Protein-calorie malnutrition, severe   SBO (small bowel obstruction), resolved 11/16 S/p exploratory laparotomy with reduction and repair of  internal hernia by Dr Teresa.  General surgery on board. Prn analgesics   Post operative ileus, resolved NG tube has been removed Patient is passing gas and having BMs On a soft diet now, tolerating well but has poor appetite and early satiety.   Right upper lobe possible bacterial pneumonia Post operative respiratory failure, now resolved.  Patient is not requiring any supplemental oxygen now   Right upper lobe aspiration pneumonia  11/16 follow up chest radiograph with no frank infiltrate.  Finished course of intravenous Zosyn  Continue to mobilize, PT and OT.   Severe hypokalemia: prn repletion  Hypophosphatemia:prn repletion   Essential hypertension Continue blood pressure monitoring.  Holding on antihypertensive agents for now due to risk of hypotension    Hyponatremia, continue to monitor closely. Likely hypovolemic secondary to poor oral intake.    Subarachnoid hemorrhage, nontraumatic  Continue PT and OT   Pressure injury of skin Continue local skin care   Protein-calorie malnutrition, severe Ordered calorie count Poor oral intake Unwilling to have NGT or cortrack placement. Not a candidate for TPN with intact bowel function. Palliative consulted. Nutritionist on board  Disposition: Back to CIR, awaiting insurance auth.   DVT prophylaxis: heparin  injection 5,000 Units Start: 09/26/24 1400 SCDs Start: 09/24/24 1816     Code Status: Full Code Family Communication: Daughter is at the bedside Status is: Inpatient Remains inpatient appropriate because: post op ileus, pending CIR placement    Subjective:  No acute events overnight.  Noah Burnett breakfast (bacon, pancakes, scrambled eggs) was in front of him and he said that he is going to try to eat it. He has early satiety.  Examination:  General exam: Appears calm and comfortable, sitting in the chair Respiratory system: Clear to  auscultation. Respiratory effort normal. Cardiovascular system: S1 & S2 heard,  RRR. No JVD, murmurs, rubs, gallops or clicks. No pedal edema. Gastrointestinal system: Abdominal dressing in place, slight peri-incisional tenderness present but no evidence of discharge Central nervous system: Alert and oriented. No focal neurological deficits. Extremities: Symmetric 5 x 5 power. Skin: No rashes, lesions or ulcers   Wound 09/24/24 1756 Pressure Injury Sacrum Medial Stage 1 -  Intact skin with non-blanchable redness of a localized area usually over a bony prominence. (Active)     Diet Orders (From admission, onward)     Start     Ordered   09/30/24 1208  DIET SOFT Room service appropriate? Yes; Fluid consistency: Thin  Diet effective now       Question Answer Comment  Room service appropriate? Yes   Fluid consistency: Thin      09/30/24 1207            Objective: Vitals:   10/02/24 1409 10/02/24 1941 10/03/24 0429 10/03/24 0757  BP: 129/77 120/74 134/69 119/75  Pulse: 80 84 80 (!) 123  Resp: 17 16 16 18   Temp: (!) 97.4 F (36.3 C) 98.2 F (36.8 C) 97.7 F (36.5 C) 98.7 F (37.1 C)  TempSrc: Oral     SpO2: 100% 99% 99% 99%  Weight:      Height:        Intake/Output Summary (Last 24 hours) at 10/03/2024 1026 Last data filed at 10/02/2024 2000 Gross per 24 hour  Intake 480 ml  Output 400 ml  Net 80 ml   Filed Weights   09/26/24 0437 09/27/24 0500 09/30/24 0500  Weight: 48.9 kg 47.1 kg 48.7 kg    Scheduled Meds:  Chlorhexidine  Gluconate Cloth  6 each Topical Daily   docusate sodium   100 mg Oral BID   feeding supplement (KATE FARMS STANDARD 1.4)  325 mL Oral BID BM   heparin  injection (subcutaneous)  5,000 Units Subcutaneous Q8H   lidocaine   1 patch Transdermal Q24H   multivitamin with minerals  1 tablet Oral Daily   pantoprazole   40 mg Oral BID   potassium chloride   40 mEq Oral Once   Continuous Infusions:     Nutritional status Signs/Symptoms: severe muscle depletion, severe fat depletion Interventions: Refer to RD note for  recommendations Body mass index is 16.81 kg/m.  Data Reviewed:   CBC: Recent Labs  Lab 09/29/24 0912  WBC 6.2  NEUTROABS 5.0  HGB 12.6*  HCT 36.5*  MCV 88.8  PLT 234   Basic Metabolic Panel: Recent Labs  Lab 09/27/24 0442 09/29/24 0912 09/29/24 1130 09/30/24 0930 10/01/24 0600 10/02/24 1048  NA 135 130*  --  129* 128* 131*  K 3.1* 2.6*  --  2.9* 2.9* 3.6  CL 98 93*  --  96* 95* 94*  CO2 24 26  --  23 23 22   GLUCOSE 102* 107*  --  81 91 92  BUN 18 11  --  7* 8 10  CREATININE 0.76 0.53*  --  0.45* 0.42* 0.65  CALCIUM  7.4* 7.2*  --  7.2* 7.5* 8.3*  MG 2.0  --  1.6* 1.8  --   --   PHOS  --   --   --  2.1*  --   --    GFR: Estimated Creatinine Clearance: 49.9 mL/min (by C-G formula based on SCr of 0.65 mg/dL). Liver Function Tests: No results for input(s): AST, ALT, ALKPHOS, BILITOT, PROT, ALBUMIN  in the last 168 hours.  No results for input(s): LIPASE, AMYLASE in the last 168 hours. No results for input(s): AMMONIA in the last 168 hours. Coagulation Profile: No results for input(s): INR, PROTIME in the last 168 hours. Cardiac Enzymes: No results for input(s): CKTOTAL, CKMB, CKMBINDEX, TROPONINI in the last 168 hours. BNP (last 3 results) No results for input(s): PROBNP in the last 8760 hours. HbA1C: No results for input(s): HGBA1C in the last 72 hours. CBG: Recent Labs  Lab 10/01/24 2325 10/02/24 0731 10/02/24 2028 10/03/24 0427 10/03/24 0726  GLUCAP 97 84 77 90 76   Lipid Profile: No results for input(s): CHOL, HDL, LDLCALC, TRIG, CHOLHDL, LDLDIRECT in the last 72 hours.  Thyroid Function Tests: No results for input(s): TSH, T4TOTAL, FREET4, T3FREE, THYROIDAB in the last 72 hours. Anemia Panel: No results for input(s): VITAMINB12, FOLATE, FERRITIN, TIBC, IRON, RETICCTPCT in the last 72 hours. Sepsis Labs: No results for input(s): PROCALCITON, LATICACIDVEN in the last 168  hours.   Recent Results (from the past 240 hours)  MRSA Next Gen by PCR, Nasal     Status: None   Collection Time: 09/24/24  6:15 PM   Specimen: Nasal Mucosa; Nasal Swab  Result Value Ref Range Status   MRSA by PCR Next Gen NOT DETECTED NOT DETECTED Final    Comment: (NOTE) The GeneXpert MRSA Assay (FDA approved for NASAL specimens only), is one component of a comprehensive MRSA colonization surveillance program. It is not intended to diagnose MRSA infection nor to guide or monitor treatment for MRSA infections. Test performance is not FDA approved in patients less than 67 years old. Performed at [facility], [address] [place], [place] [address]          Radiology Studies: No results found.          LOS: 9 days   Time spent= 40 mins    Deliliah Room, MD [facility]  If 7PM-7AM, please contact night-coverage  10/03/2024, 10:26 AM

## 2024-10-03 NOTE — Progress Notes (Signed)
 9 Days Post-Op   Subjective/Chief Complaint: Continues to endorse poor appetite and early satiety, says he is drinking the [employer] protein shakes. Continues to have bowel function.  Denies nausea or vomiting.  Continues to decline nasoenteric feeding tube and states I will just go home and die  Objective: Vital signs in last 24 hours: Temp:  [97.4 F (36.3 C)-98.7 F (37.1 C)] 98.7 F (37.1 C) (11/25 0757) Pulse Rate:  [80-123] 123 (11/25 0757) Resp:  [16-18] 18 (11/25 0757) BP: (119-134)/(69-77) 119/75 (11/25 0757) SpO2:  [99 %-100 %] 99 % (11/25 0757) Last BM Date : 10/01/24  Intake/Output from previous day: 11/24 0701 - 11/25 0700 In: 480 [P.O.:480] Out: 400 [Urine:400] Intake/Output this shift: No intake/output data recorded.  Gen: alert, up in chair, chronically ill appearing Abdomen: Incision clean dry intact soft nontender staples in place MSK: muscle wasting of the extremities  Lab Results:  No results for input(s): WBC, HGB, HCT, PLT in the last 72 hours.  BMET Recent Labs    10/01/24 0600 10/02/24 1048  NA 128* 131*  K 2.9* 3.6  CL 95* 94*  CO2 23 22  GLUCOSE 91 92  BUN 8 10  CREATININE 0.42* 0.65  CALCIUM  7.5* 8.3*      Anti-infectives: Anti-infectives (From admission, onward)    Start     Dose/Rate Route Frequency Ordered Stop   09/26/24 1400  piperacillin -tazobactam (ZOSYN ) IVPB 3.375 g        3.375 g 12.5 mL/hr over 240 Minutes Intravenous Every 8 hours 09/26/24 1227 09/29/24 0214   09/25/24 0000  piperacillin -tazobactam (ZOSYN ) IVPB 3.375 g  Status:  Discontinued        3.375 g 12.5 mL/hr over 240 Minutes Intravenous Every 8 hours 09/24/24 1834 09/26/24 0940   09/24/24 1645  piperacillin -tazobactam (ZOSYN ) IVPB 3.375 g        3.375 g 100 mL/hr over 30 Minutes Intravenous Every 8 hours 09/24/24 1634 09/24/24 1645       Assessment/Plan: s/p Procedure(s): LAPAROTOMY, EXPLORATORY; REDUCTION AND REPAIR OF INTERNAL HERNIA  (N/A) 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 Teresa on 11/16 - Soft diet - Reported to require cortrak/TF's prior to surgery. May need this replaced/restarted for supplemental nutrition - per medicine team.  Patient currently refusing.  - On abx for aspiration pna.  Defer stoplight to primary team. - Mobilize as tolerated. PT - Pulm toilet  -Stable surgically for placement for discharge when medically.  FEN: soft, boost, IVF per [facility] VTE: SCDs,  SQH ID: [email or web address]  completed for aspiration PNA, no role for abx from surgical standpoint Foley: present prior to surgery for retention, per primary, okay to remove from our standpoint   Dispo: CIR vs home, I have ordered a calorie count for better assessment of nutritional status, but this could be continued at CIR. From a surgical standpoint he is stable for CIR. Surgery team will sign off at this point.  Please call if any questions or concerns.  - per [facility] -  Hx of prostate CA Tobacco use HTN GERD Shingles with encephalitis Recent non-traumatic SAH VDRF Urinary retention with foley present    LOS: 9 days    Mitzie DELENA Freund MD 10/03/2024

## 2024-10-03 NOTE — Progress Notes (Signed)
 PT Cancellation Note  Patient Details Name: MARSALIS BEAULIEU MRN: [id number] DOB: 03/27/1941   Cancelled Treatment:    Reason Eval/Treat Not Completed: Other (comment). Pt declined and stated I'm going to wait for my food and eat my four bites and then I'll see if I'm up to walking. Acute PT to return as able to progress mobility.  Arlicia Paquette, PT, DPT Acute Rehabilitation Services Secure chat preferred Office #: [phone]     Norene CHRISTELLA Ames 10/03/2024, 12:10 PM

## 2024-10-04 LAB — BASIC METABOLIC PANEL WITH GFR
Anion gap: 15 (ref 5–15)
BUN: 13 mg/dL (ref 8–23)
CO2: 17 mmol/L — ABNORMAL LOW (ref 22–32)
Calcium: 7.7 mg/dL — ABNORMAL LOW (ref 8.9–10.3)
Chloride: 99 mmol/L (ref 98–111)
Creatinine, Ser: 0.6 mg/dL — ABNORMAL LOW (ref 0.61–1.24)
GFR, Estimated: >60 mL/min (ref >60)
Glucose, Bld: 94 mg/dL (ref 70–99)
Potassium: 3.6 mmol/L (ref 3.5–5.1)
Sodium: 131 mmol/L — ABNORMAL LOW (ref 135–145)

## 2024-10-04 LAB — GLUCOSE, CAPILLARY
Glucose-Capillary: 112 mg/dL — ABNORMAL HIGH (ref 70–99)
Glucose-Capillary: 136 mg/dL — ABNORMAL HIGH (ref 70–99)
Glucose-Capillary: 57 mg/dL — ABNORMAL LOW (ref 70–99)
Glucose-Capillary: 82 mg/dL (ref 70–99)
Glucose-Capillary: 84 mg/dL (ref 70–99)

## 2024-10-04 MED ORDER — POLYETHYLENE GLYCOL 3350 17 G PO PACK: 17.0000 g | PACK | Freq: Every day | ORAL | Status: DC | PRN

## 2024-10-04 MED ORDER — MAGNESIUM OXIDE -MG SUPPLEMENT 400 (240 MG) MG PO TABS
200.0000 mg | ORAL_TABLET | Freq: Every day | ORAL | Status: DC
  Administered 2024-10-04 – 2024-10-06 (×3): 200 mg via ORAL
  Filled 2024-10-04 (×3): qty 1

## 2024-10-04 MED ORDER — POLYETHYLENE GLYCOL 3350 17 G PO PACK: 17.0000 g | PACK | Freq: Every day | ORAL | Status: DC

## 2024-10-04 NOTE — Progress Notes (Signed)
 Occupational Therapy Treatment Patient Details Name: Noah Burnett MRN: [id number] DOB: [birth year] Today's Date: 10/04/2024   History of present illness 83 y/o M presenting from CIR on 11/16 with acute respiratory hypoxia and abdominal distention, found to have ischemia bowel perforation s/p ex lap with reduction and repair of internal hernia on 09/24/2024    PMH includes duodenal mass s/p robotic gastrojejunostomy, nontraumatic SAH   OT comments  Pt is progressing towards goals. Focus of session on progressing functional mobility and increasing independence in ADL tasks OOB. Pt required Min A for functional transfer and UB dressing this date. Pt upset throughout session regarding length of hospital stay and complications with insurance. OT addressed Pt concerns to the best ability and attempted to redirect Pt perseverating conversation. Pt continues to benefit from skilled OT services in acute care to maximize functional abilities and facilitate progress towards goals. Continue per POC.       If plan is discharge home, recommend the following:  A little help with walking and/or transfers;A lot of help with bathing/dressing/bathroom;Direct supervision/assist for financial management;Assistance with cooking/housework;Direct supervision/assist for medications management;Help with stairs or ramp for entrance;Supervision due to cognitive status   Equipment Recommendations  BSC/3in1    Recommendations for Other Services      Precautions / Restrictions Precautions Precautions: Fall Recall of Precautions/Restrictions: Impaired Restrictions Weight Bearing Restrictions Per Provider Order: No       Mobility Bed Mobility Overal bed mobility: Needs Assistance Bed Mobility: Supine to Sit     Supine to sit: Supervision, HOB elevated     General bed mobility comments: Supervision for safety and verbal cues to maintain sitting at EOB until safe to rise.    Transfers Overall transfer  level: Needs assistance Equipment used: Rolling walker (2 wheels) Transfers: Sit to/from Stand, Bed to chair/wheelchair/BSC Sit to Stand: Min assist     Step pivot transfers: Contact guard assist     General transfer comment: Min A for safety and to facilitate trunk extension in standing. Pt impulsive with mobility requiring verbal cues for enviornmental awareness. Decreased activity tolerance with functional transfers.     Balance Overall balance assessment: Needs assistance Sitting-balance support: No upper extremity supported, Feet supported Sitting balance-Leahy Scale: Fair Sitting balance - Comments: flexed posture 2/2 abd pain Postural control: Other (comment) (anterior) Standing balance support: Bilateral upper extremity supported, During functional activity, Reliant on assistive device for balance Standing balance-Leahy Scale: Poor Standing balance comment: Dependent on RW and CGA.                           ADL either performed or assessed with clinical judgement   ADL Overall ADL's : Needs assistance/impaired                 Upper Body Dressing : Minimal assistance;Sitting Upper Body Dressing Details (indicate cue type and reason): Min A to don new gown     Toilet Transfer: Minimal assistance;Cueing for safety;Ambulation;Regular Toilet;Rolling walker (2 wheels)                  Extremity/Trunk Assessment Upper Extremity Assessment Upper Extremity Assessment: Right hand dominant;Generalized weakness;RUE deficits/detail;LUE deficits/detail RUE Deficits / Details: generalized weakness; decrease grasp (R>L); decreased fine motor coordination; noted mildly edematous in hand and wrist RUE Coordination: decreased fine motor LUE Deficits / Details: generalized weakness; decrease grasp (R>L); decreased fine motor coordination            Vision  Vision Assessment?: No apparent visual deficits   Perception     Praxis     Communication  Communication Communication: Impaired Factors Affecting Communication: Hearing impaired   Cognition Arousal: Alert Behavior During Therapy: Impulsive, Flat affect Cognition: Cognition impaired   Orientation impairments: Situation Awareness: Intellectual awareness intact, Online awareness impaired Memory impairment (select all impairments): Short-term memory Attention impairment (select first level of impairment): Selective attention Executive functioning impairment (select all impairments): Reasoning, Problem solving OT - Cognition Comments: Pt with decreased insight into deficits and safety awareness. OT attempted to explain to Pt current plan of care, but unable to reason.                 Following commands: Intact        Cueing   Cueing Techniques: Verbal cues, Visual cues  Exercises      Shoulder Instructions       General Comments Pt upset regarding length of hospital stay and difficulties with insurance. Pt provided with explanations and reasoning for continued engagement in acute rehab until insurance authorization. OT explained safety procedures (gait belt, chair alarm, RW) to Pt with minimal response noted.    Pertinent Vitals/ Pain       Pain Assessment Pain Assessment: Faces Faces Pain Scale: Hurts even more Pain Location: abdomen Pain Descriptors / Indicators: Discomfort, Moaning Pain Intervention(s): Limited activity within patient's tolerance, Monitored during session, Repositioned  Home Living                                          Prior Functioning/Environment              Frequency  Min 2X/week        Progress Toward Goals  OT Goals(current goals can now be found in the care plan section)  Progress towards OT goals: Progressing toward goals  Acute Rehab OT Goals Patient Stated Goal: to get out of hospital OT Goal Formulation: With patient Time For Goal Achievement: 10/10/24 Potential to Achieve Goals: Good ADL  Goals Pt Will Perform Grooming: with supervision;sitting Pt Will Perform Upper Body Dressing: with contact guard assist;sitting;standing Pt Will Perform Lower Body Dressing: with contact guard assist;sit to/from stand;sitting/lateral leans Pt Will Transfer to Toilet: with contact guard assist;ambulating;regular height toilet Additional ADL Goal #1: pt will tolerate OOB standing activity x10 min in prep for OOB ADLs  Plan      Co-evaluation                 AM-PAC OT 6 Clicks Daily Activity     Outcome Measure   Help from another person eating meals?: A Little Help from another person taking care of personal grooming?: A Little Help from another person toileting, which includes using toliet, bedpan, or urinal?: A Lot Help from another person bathing (including washing, rinsing, drying)?: A Lot Help from another person to put on and taking off regular upper body clothing?: A Lot Help from another person to put on and taking off regular lower body clothing?: A Lot 6 Click Score: 14    End of Session Equipment Utilized During Treatment: Gait belt;Rolling walker (2 wheels)  OT Visit Diagnosis: Unsteadiness on feet (R26.81);Other abnormalities of gait and mobility (R26.89);Muscle weakness (generalized) (M62.81)   Activity Tolerance Patient tolerated treatment well   Patient Left in chair;with call bell/phone within reach;with chair alarm set   Nurse Communication  Time: 0810-0828 OT Time Calculation (min): 18 min  Charges: OT General Charges $OT Visit: 1 Visit OT Treatments $Therapeutic Activity: 8-22 mins  Noah CROME, OTR/L.  [facility] Acute Rehabilitation  Office: [phone]   Noah Burnett 10/04/2024, 10:35 AM

## 2024-10-04 NOTE — TOC Progression Note (Signed)
 Transition of Care [facility]) - Progression Note    Patient Details  Name: Noah Burnett MRN: [id number] Date of Birth: 1941-09-06  Transition of Care [facility]) CM/SW Contact  Rosalva Jon Bloch, RN Phone Number: 10/04/2024, 3:58 PM  Clinical Narrative:    Pt wants to d/c to home with home health services, no CIR appeal. Daughter to assist with care once home.  Preference for home health services: [facility]. Referral made with [facility] and accepted pending MD orders.  Referral made with [employer] for W/C and BSC. Equipment will be delivered to bedside prior to d/c. IP CM team following ....  Expected Discharge Plan: Home w Home Health Services Barriers to Discharge: Continued Medical Work up, English As A Second Language Teacher               Expected Discharge Plan and Services   Discharge Planning Services: CM Consult Post Acute Care Choice: Home Health Living arrangements for the past 2 months: Single Family Home                 DME Arranged: Bedside commode, Wheelchair manual   Date DME Agency Contacted: 10/04/24 Time DME Agency Contacted: 1557 Representative spoke with at DME Agency: [facility] HH Arranged: PT, OT [facility] Agency: [employer] [facility] Date [facility] Agency Contacted: 10/04/24 Time HH Agency Contacted: 1557 Representative spoke with at [facility] Agency: [place]   Social Drivers of Health (SDOH) Interventions SDOH Screenings   Food Insecurity: No Food Insecurity (09/21/2024)  Housing: Low Risk  (09/21/2024)  Transportation Needs: No Transportation Needs (09/21/2024)  Utilities: Not At Risk (09/21/2024)  Social Connections: Unknown (09/08/2024)  Tobacco Use: Medium Risk (09/21/2024)    Readmission Risk Interventions     No data to display

## 2024-10-04 NOTE — Progress Notes (Signed)
 Physical Therapy Treatment Patient Details Name: Noah Burnett MRN: [id number] DOB: March 05, 1941 Today's Date: 10/04/2024   History of Present Illness 83 y/o M presenting from CIR on 11/16 with acute respiratory hypoxia and abdominal distention, found to have ischemia bowel perforation s/p ex lap with reduction and repair of internal hernia on 09/24/2024    PMH includes duodenal mass s/p robotic gastrojejunostomy, nontraumatic SAH    PT Comments  Pt very frustrated with length of hospital stay, progressive weakness, and inability to eat and keep food down. Pt strongly desires to return home. Family reports the has a good support system. Pt continues to present with decreased activity tolerance, generalized weakness, impaired balance, and increased fall risk. Offered to complete strengthening exercises however pt declined stating I am wore out.  Acute PT to cont to follow.    If plan is discharge home, recommend the following: Assistance with cooking/housework;Assist for transportation;Help with stairs or ramp for entrance;A lot of help with walking and/or transfers   Can travel by private vehicle        Equipment Recommendations  None recommended by PT    Recommendations for Other Services Rehab consult     Precautions / Restrictions Precautions Precautions: Fall Recall of Precautions/Restrictions: Impaired Restrictions Weight Bearing Restrictions Per Provider Order: No     Mobility  Bed Mobility Overal bed mobility: Needs Assistance Bed Mobility: Sit to Supine       Sit to supine: Min assist   General bed mobility comments: minA for LE management back into bed    Transfers Overall transfer level: Needs assistance Equipment used: Rolling walker (2 wheels) Transfers: Sit to/from Stand, Bed to chair/wheelchair/BSC Sit to Stand: Min assist           General transfer comment: Min A for safety and to facilitate trunk extension in standing. Pt impulsive with mobility  requiring verbal cues for enviornmental awareness. Decreased activity tolerance with functional transfers.    Ambulation/Gait Ambulation/Gait assistance: Contact guard assist Gait Distance (Feet): 60 Feet Assistive device: Rolling walker (2 wheels) Gait Pattern/deviations: Step-through pattern, Decreased stride length, Trunk flexed, Narrow base of support Gait velocity: decreased Gait velocity interpretation: <1.31 ft/sec, indicative of household ambulator   General Gait Details: pt with low shuffling steps with flexed trunk, CGA for safety, pt limited due to pain and onset of fatigue   Stairs             Wheelchair Mobility     Tilt Bed    Modified Rankin (Stroke Patients Only) Modified Rankin (Stroke Patients Only) Pre-Morbid Rankin Score: Moderately severe disability Modified Rankin: Moderately severe disability     Balance Overall balance assessment: Needs assistance Sitting-balance support: No upper extremity supported, Feet supported Sitting balance-Leahy Scale: Fair Sitting balance - Comments: flexed posture 2/2 abd pain Postural control: Other (comment) (anterior) Standing balance support: Bilateral upper extremity supported, During functional activity, Reliant on assistive device for balance Standing balance-Leahy Scale: Poor Standing balance comment: Dependent on RW and CGA.                            Communication Communication Communication: Impaired Factors Affecting Communication: Hearing impaired  Cognition Arousal: Alert Behavior During Therapy: Flat affect   PT - Cognitive impairments: Safety/Judgement Difficult to assess due to: Impaired communication                     PT - Cognition Comments: HOH hears better on  R ear. Pt frustrated with inability to eat, progressive weakness, and prolonged hospital stay and desires to return home. Following commands: Intact Following commands impaired: Follows one step commands with  increased time    Cueing Cueing Techniques: Verbal cues, Visual cues  Exercises      General Comments General comments (skin integrity, edema, etc.): VSS      Pertinent Vitals/Pain Pain Assessment Pain Assessment: Faces Faces Pain Scale: Hurts little more Pain Location: abdomen Pain Descriptors / Indicators: Discomfort, Moaning Pain Intervention(s): Monitored during session    Home Living                          Prior Function            PT Goals (current goals can now be found in the care plan section) Acute Rehab PT Goals Patient Stated Goal: go home PT Goal Formulation: With patient Time For Goal Achievement: 10/10/24 Potential to Achieve Goals: Good Progress towards PT goals: Progressing toward goals    Frequency    Min 3X/week      PT Plan      Co-evaluation              AM-PAC PT 6 Clicks Mobility   Outcome Measure  Help needed turning from your back to your side while in a flat bed without using bedrails?: A Little Help needed moving from lying on your back to sitting on the side of a flat bed without using bedrails?: A Lot Help needed moving to and from a bed to a chair (including a wheelchair)?: A Little Help needed standing up from a chair using your arms (e.g., wheelchair or bedside chair)?: A Little Help needed to walk in hospital room?: A Little Help needed climbing 3-5 steps with a railing? : Total 6 Click Score: 15    End of Session Equipment Utilized During Treatment: Gait belt Activity Tolerance: Patient tolerated treatment well Patient left: in chair;with call bell/phone within reach;with family/visitor present Nurse Communication: Mobility status PT Visit Diagnosis: Unsteadiness on feet (R26.81);Other abnormalities of gait and mobility (R26.89);Muscle weakness (generalized) (M62.81);History of falling (Z91.81);Difficulty in walking, not elsewhere classified (R26.2);Pain Pain - part of body:  (abdomen)     Time:  8689-8678 PT Time Calculation (min) (ACUTE ONLY): 11 min  Charges:    $Gait Training: 8-22 mins PT General Charges $$ ACUTE PT VISIT: 1 Visit                     Norene Ames, PT, DPT Acute Rehabilitation Services Secure chat preferred Office #: [phone]    Norene CHRISTELLA Ames 10/04/2024, 1:40 PM

## 2024-10-04 NOTE — Progress Notes (Signed)
 IP rehab admissions - We received a denial for CIR admission.  Initially patient and daughter wanted to appeal.  After meeting with patient/daughter, they have decided to go home with [facility] services.  Recommend HH PT/OT, an aide/nurse and as much help as we can get for him to decrease the burden of care for the family.  [phone]

## 2024-10-04 NOTE — Progress Notes (Signed)
 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   Abdominal distention workup showed finding concerning for bowel ischemia in the setting of a prior gastrojejunostomy.    Imaging showed evidence of free peritoneal air with multiple mid to distal dilated small bowel with moderate diffuse pneumatosis.  Otherwise there is no PE, moderate bilateral pleural effusion was appreciated. On 11/16 patient was taken to the OR for ischemic bowel with perforation and underwent ex lap with reduction and repair of internal hernia with postop findings of internal hernia containing small bowel, and diffuse pneumatosis intestinalis   Doing well except poor oral intake Awaiting insurance auth to go back to CIR. Palliative consulted.  Assessment & Plan:   SBO (small bowel obstruction), resolved 11/16 S/p exploratory laparotomy with reduction and repair of internal hernia by Dr Teresa.  Prn analgesics   Post operative ileus, resolved NG tube has been removed Patient is passing gas and having BMs On a soft diet now, tolerating well but has poor appetite and early satiety.   Right upper lobe possible bacterial pneumonia/aspiration pna Post operative respiratory failure, now resolved.  Patient is not requiring any supplemental oxygen now Finished course of intravenous Zosyn    Severe hypokalemia: prn  repletion  Hypophosphatemia:prn repletion   Essential hypertension Continue blood pressure monitoring.  Holding on antihypertensive agents for now due to risk of hypotension    Hyponatremia, continue to monitor closely. Likely hypovolemic secondary to poor oral intake.    Subarachnoid hemorrhage, nontraumatic  Continue PT and OT   Pressure injury of skin Wound 09/24/24 1756 Pressure Injury Sacrum Medial Stage 1 -  Intact skin with non-blanchable redness of a localized area usually over a bony prominence. (Active)       Protein-calorie malnutrition, severe Ordered calorie count Poor oral intake Unwilling to have NGT or cortrack placement. Not a candidate for TPN with intact bowel function. Palliative consulted. Nutritionist on board  Disposition: Back to CIR, awaiting insurance auth.   DVT prophylaxis: heparin  injection 5,000 Units Start: 09/26/24 1400 SCDs Start: 09/24/24 1816     Code Status: Full Code  Status is: Inpatient Remains inpatient appropriate because: post op ileus, pending CIR placement    Subjective:  No acute events overnight.  His breakfast (bacon, pancakes, scrambled eggs) was in front of him and he said that he is going to try to eat it. He has early satiety.  Examination:   General: Appearance:    Thin male in no acute distress     Lungs:     respirations unlabored  Heart:    Bradycardic  MS:   All extremities are intact.   Neurologic:   Awake, alert      Wound 09/24/24 1756 Pressure Injury Sacrum Medial Stage 1 -  Intact skin with non-blanchable redness of a localized area usually over a bony prominence. (Active)     Diet Orders (From admission, onward)     Start     Ordered  09/30/24 1208  DIET SOFT Room service appropriate? Yes; Fluid consistency: Thin  Diet effective now       Question Answer Comment  Room service appropriate? Yes   Fluid consistency: Thin      09/30/24 1207            Objective: Vitals:   10/03/24  2058 10/04/24 0457 10/04/24 0458 10/04/24 0733  BP: 128/65  (!) 113/58 119/84  Pulse: 82  64 (!) 49  Resp: 16  18 16   Temp: 98.6 F (37 C)  97.9 F (36.6 C) (!) 97.5 F (36.4 C)  TempSrc: Oral  Oral   SpO2: 99%  99% 98%  Weight:  49 kg    Height:        Intake/Output Summary (Last 24 hours) at 10/04/2024 0958 Last data filed at 10/04/2024 0501 Gross per 24 hour  Intake 360 ml  Output 525 ml  Net -165 ml   Filed Weights   09/27/24 0500 09/30/24 0500 10/04/24 0457  Weight: 47.1 kg 48.7 kg 49 kg    Scheduled Meds:  Chlorhexidine  Gluconate Cloth  6 each Topical Daily   docusate sodium   100 mg Oral BID   feeding supplement (KATE FARMS STANDARD 1.4)  325 mL Oral BID BM   heparin  injection (subcutaneous)  5,000 Units Subcutaneous Q8H   lidocaine   1 patch Transdermal Q24H   multivitamin with minerals  1 tablet Oral Daily   pantoprazole   40 mg Oral BID   potassium chloride   40 mEq Oral Once   Continuous Infusions:     Nutritional status Signs/Symptoms: severe muscle depletion, severe fat depletion Interventions: Refer to RD note for recommendations Body mass index is 16.92 kg/m.  Data Reviewed:   CBC: Recent Labs  Lab 09/29/24 0912  WBC 6.2  NEUTROABS 5.0  HGB 12.6*  HCT 36.5*  MCV 88.8  PLT 234   Basic Metabolic Panel: Recent Labs  Lab 09/29/24 0912 09/29/24 1130 09/30/24 0930 10/01/24 0600 10/02/24 1048 10/04/24 0453  NA 130*  --  129* 128* 131* 131*  K 2.6*  --  2.9* 2.9* 3.6 3.6  CL 93*  --  96* 95* 94* 99  CO2 26  --  23 23 22  17*  GLUCOSE 107*  --  81 91 92 94  BUN 11  --  7* 8 10 13   CREATININE 0.53*  --  0.45* 0.42* 0.65 0.60*  CALCIUM  7.2*  --  7.2* 7.5* 8.3* 7.7*  MG  --  1.6* 1.8  --   --   --   PHOS  --   --  2.1*  --   --   --    GFR: Estimated Creatinine Clearance: 50.2 mL/min (A) (by C-G formula based on SCr of 0.6 mg/dL (L)). Liver Function Tests: No results for input(s): AST, ALT, ALKPHOS, BILITOT, PROT, ALBUMIN  in  the last 168 hours.  No results for input(s): LIPASE, AMYLASE in the last 168 hours. No results for input(s): AMMONIA in the last 168 hours. Coagulation Profile: No results for input(s): INR, PROTIME in the last 168 hours. Cardiac Enzymes: No results for input(s): CKTOTAL, CKMB, CKMBINDEX, TROPONINI in the last 168 hours. BNP (last 3 results) No results for input(s): PROBNP in the last 8760 hours. HbA1C: No results for input(s): HGBA1C in the last 72 hours. CBG: Recent Labs  Lab 10/03/24 0726 10/03/24 1148 10/03/24 1548 10/04/24 0918 10/04/24 0952  GLUCAP 76 96 101* 57* 82   Lipid Profile: No results for input(s):  CHOL, HDL, LDLCALC, TRIG, CHOLHDL, LDLDIRECT in the last 72 hours.  Thyroid Function Tests: No results for input(s): TSH, T4TOTAL, FREET4, T3FREE, THYROIDAB in the last 72 hours. Anemia Panel: No results for input(s): VITAMINB12, FOLATE, FERRITIN, TIBC, IRON, RETICCTPCT in the last 72 hours. Sepsis Labs: No results for input(s): PROCALCITON, LATICACIDVEN in the last 168 hours.   Recent Results (from the past 240 hours)  MRSA Next Gen by PCR, Nasal     Status: None   Collection Time: 09/24/24  6:15 PM   Specimen: Nasal Mucosa; Nasal Swab  Result Value Ref Range Status   MRSA by PCR Next Gen NOT DETECTED NOT DETECTED Final    Comment: (NOTE) The GeneXpert MRSA Assay (FDA approved for NASAL specimens only), is one component of a comprehensive MRSA colonization surveillance program. It is not intended to diagnose MRSA infection nor to guide or monitor treatment for MRSA infections. Test performance is not FDA approved in patients less than 13 years old. Performed at [facility], [address], [place], [place] [address]          Radiology Studies: No results found.          LOS: 10 days   Time spent= 40 mins    Harlene RAYMOND Bowl, DO [facility]  If 7PM-7AM, please  contact night-coverage  10/04/2024, 9:58 AM

## 2024-10-05 LAB — GLUCOSE, CAPILLARY
Glucose-Capillary: 97 mg/dL (ref 70–99)
Glucose-Capillary: 77 mg/dL (ref 70–99)
Glucose-Capillary: 80 mg/dL (ref 70–99)

## 2024-10-05 MED ORDER — TAMSULOSIN HCL 0.4 MG PO CAPS
0.4000 mg | ORAL_CAPSULE | Freq: Every day | ORAL | Status: DC
  Administered 2024-10-05 – 2024-10-06 (×2): 0.4 mg via ORAL
  Filled 2024-10-05 (×2): qty 1

## 2024-10-05 MED ORDER — TAMSULOSIN HCL 0.4 MG PO CAPS: 0.4000 mg | ORAL_CAPSULE | Freq: Every day | ORAL | 0 refills | Status: AC

## 2024-10-05 MED ORDER — POLYETHYLENE GLYCOL 3350 17 G PO PACK: 17.0000 g | PACK | Freq: Every day | ORAL | Status: AC | PRN

## 2024-10-05 MED ORDER — MAGNESIUM OXIDE -MG SUPPLEMENT 400 (240 MG) MG PO TABS: 200.0000 mg | ORAL_TABLET | Freq: Every day | ORAL | Status: AC

## 2024-10-05 MED ORDER — DOCUSATE SODIUM 100 MG PO CAPS: 100.0000 mg | ORAL_CAPSULE | Freq: Two times a day (BID) | ORAL | Status: AC | PRN

## 2024-10-05 MED ORDER — LIDOCAINE 5 % EX PTCH: 1.0000 | MEDICATED_PATCH | CUTANEOUS | Status: AC

## 2024-10-05 NOTE — Progress Notes (Addendum)
 No mention of foley in any note from prior [facility] MD.  Will start flomax  and do voiding trial prior to d/c. JV  Will continue to monitor as still has not voided on own- may need IVF.  Hope for d/c in AM JV

## 2024-10-05 NOTE — Plan of Care (Signed)
   Problem: Coping: Goal: Level of anxiety will decrease Outcome: Progressing   Problem: Pain Managment: Goal: General experience of comfort will improve and/or be controlled Outcome: Progressing   Problem: Safety: Goal: Ability to remain free from injury will improve Outcome: Progressing

## 2024-10-05 NOTE — Discharge Summary (Signed)
 Physician Discharge Summary  Noah Burnett FMW:[id number] DOB: 1941/08/05 DOA: 09/24/2024  PCP: Noah Norleen PEDLAR, MD  Admit date: 09/24/2024 Discharge date: 10/06/2024  Admitted From:  Discharge disposition: home   Recommendations for Outpatient Follow-Up:   Insurance denied CIR- home with maxed out home health [facility] care-- needs voiding trial with Noah Burnett Consider palliative care referral for continued GOC   Discharge Diagnosis:   Principal Problem:   SBO (small bowel obstruction) (HCC) Active Problems:   Right upper lobe pneumonia   Essential hypertension   Hyponatremia   Subarachnoid hemorrhage, nontraumatic (HCC)   Pressure injury of skin   Protein-calorie malnutrition, severe    Discharge Condition: Improved.  Diet recommendation: Regular.  Wound care: None.  Code status: Full.   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  He had an EGD that showed hiatal hernia and compression of the second portion of duodenum with possible intraluminal neoplasm.  Biopsies were obtained which showed severely inflamed and ulcerated duodenal mucosa, but no evidence of malignancy.  A second EGD was done with ultrasound guidance but again pathology of the lesion extensive necrosis.  Surgical oncology consulted underwent robotic gastrojejunostomy on 8/25.  He did have delayed return of bowel function and a PICC line with TPN for several days until he had advancement of his diet.  Since his procedure he had had fairly steady decline with decreasing appetite significant weight loss and inability to walk.  He also had follow-up surgical oncology.  He had repeat MRI  which did not show the duodenal mass.  Per chart review patient lives alone.  1 level home one-step to entry.  He has required assist from his family for ADLs and use of a rolling walker for mobility the past 2 weeks and has experienced 1 fall in the past 2 weeks.  Presented 09/08/2024 to [facility] with altered mental status and unwitnessed fall as well as reports of generalized weakness and fatigue.  In the ED abdominal x-ray showed no evidence of obstruction.  Cranial CT scan negative for acute changes.  MRI showed multifocal subcortical contrast-enhancement, greatest at the right parietal-occipital fissure but present at multiple subcortical locations in both hemispheres.  Small areas of diffusion abnormality at the anterior left insula, left frontal operculum, right frontal operculum, and in multiple locations of both corona radiata and some lesions appearing more acute than others based on lower ADC values.  There was multiple possibilities raised by above findings considerations infectious versus inflammatory encephalitis versus vasculitis in CSF.  Patient was transferred to [facility] for further evaluation.  Chemistries unremarkable except sodium 128 potassium 3.4 chloride 90 AST 14, total CK of 30, TSH 4.632, a.m. cortisol 30, serum osmolality 257, urine sodium 137, urine osmolality 490 urinalysis negative for pyuria.  Neurology was consulted for evaluation of acute encephalopathy as well as nephrology consulted for hyponatremia.  Patient underwent fluoroscopic guided lumbar puncture and fluid sent for analysis for evaluation of meningitis encephalitis.  Procalcitonin negative, CRP 4.1.  EEG showed diffuse cerebral dysfunction no seizure seen throughout the recording.  VZV PCR was positive, varicella IgG negative.  On the morning of 09/13/2024 patient noted to have  left gaze preference and decreased responsiveness code stroke was called patient was seen by neurology services.  Stat CT was  done which showed left superior convexity subarachnoid hemorrhage without mass effect.  Otherwise no changes from previous CT.  CT angiogram ruled out large vessel occlusion.  Patient was initially kept n.p.o. follow-up speech therapy.  Eventually MRI completed showed prominent patchy cortical and subcortical signal abnormality throughout both hemispheres overall slight progressed from 09/09/2024 and intermediate although encephalitis and vasculitis remain leading considerations.  ID recommended continuing acyclovir .  Patient's mental status continued to improve.  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  Patient was also started on high-dose methylprednisolone  by neurology 09/14/2024 with the plan to continue for 5 days followed by taper of prednisone . In regards to patient's hyponatremia felt to be chronic with nephrology follow-up possibly SIADH noted sodium in August 2025 was 129 and latest sodium 123-124  and currently remains on sodium chloride  tablets x 7 days and the addition of urea  x 6 doses with recent worsening of hyponatremia likely attributed to recent high-dose Solu-Medrol .  The sodium level had actually worsened with normal saline and since IV fluid was required during acyclovir  treatment reduce to 25 cc an hour and received a dose of Lasix  09/19/2024 as well as 09/20/2024 and plan to continue Lasix  at 20 mg p.o. daily. Bouts of urinary retention on the evening of 09/13/2024 patient was retaining urine bladder scan for 900 cc orders given for nurse for straight caths however that was unsuccessful so coud catheter was placed and started on Flomax . AKI creatinine jumped from 0.76-1 0.66-2.07 09/14/2024 nephrology had been following at that time given IV fluids AKI presumed to be secondary to obstructive uropathy as well as contrast-induced.  AKI resolved 09/15/2024.  Patient also with bouts of  hypophosphatemia supplemented with follow-up chemistries Patient was cleared to begin Lovenox  for DVT prophylaxis. His diet has been advanced to mechanical soft initially requiring tube feeds for nutritional support.  Palliative care was consulted to establish goals of care.  Patient did have some persistent nausea and vomiting with KUB 09/17/2024 showing ileus.  He was having no tenderness with regular bowel movements reported. Therapy evaluations completed due to patient decreased functional mobility was admitted for a comprehensive rehab program.   Hospital Course by Problem:   SBO (small bowel obstruction), resolved 11/16 S/p exploratory laparotomy with reduction and repair of internal hernia by Dr Teresa.  Prn analgesics   Post operative ileus, resolved NG tube has been removed Patient is passing gas and having BMs On a soft diet now, tolerating well but has poor appetite and early satiety.   Right upper lobe possible bacterial pneumonia/aspiration pna Post operative respiratory failure, now resolved.  Patient is not requiring any supplemental oxygen now Finished course of intravenous Zosyn      Severe hypokalemia: prn repletion   Hypophosphatemia:prn repletion   Essential hypertension Continue blood pressure monitoring.  Holding on antihypertensive agents for now due to risk of hypotension    Hyponatremia, continue to monitor closely. Likely hypovolemic secondary to poor oral intake.     Subarachnoid hemorrhage, nontraumatic  Continue PT and OT at home   Pressure injury of skin Wound 09/24/24 1756 Pressure Injury Sacrum Medial Stage 1 -  Intact skin with non-blanchable redness of a localized area usually over a bony prominence. (Active)        Protein-calorie malnutrition, severe Poor oral intake  Medical Consultants:   PCCM Palliative care General surgery   Discharge Exam:   Vitals:   10/06/24 0350 10/06/24 0752  BP: 116/74 114/71  Pulse: 61 76  Resp:  17 16  Temp: 97.8 F (36.6 C) 97.9 F (36.6 C)  SpO2: 98% 100%   Vitals:   10/05/24 1954 10/06/24 0350 10/06/24 0407 10/06/24 0752  BP: (!) 96/52 116/74  114/71  Pulse: 72 61  76  Resp: 17 17  16   Temp: 98.5 F (36.9 C) 97.8 F (36.6 C)  97.9 F (36.6 C)  TempSrc: Oral Oral  Oral  SpO2: 99% 98%  100%  Weight:   46.1 kg   Height:        General exam: Appears calm and comfortable.     The results of significant diagnostics from this hospitalization (including imaging, microbiology, ancillary and laboratory) are listed below for reference.     Procedures and Diagnostic Studies:   DG CHEST PORT 1 VIEW Result Date: 09/24/2024 CLINICAL DATA:  Respiratory failure. EXAM: PORTABLE CHEST 1 VIEW COMPARISON:  09/24/2024. FINDINGS: The heart size and mediastinal contours are within normal limits. There is atherosclerotic calcification of the aorta. An endotracheal tube terminates 3.4 cm above the carina. Scattered airspace opacities are noted in the lungs bilaterally. There is a small pleural effusion on the left. No pneumothorax bilaterally. An enteric tube terminates in the stomach. IMPRESSION: 1. Scattered airspace disease bilaterally, possible atelectasis or infiltrate. 2. Small left pleural effusion. 3. Support apparatus as described above. Electronically Signed   By: Leita Birmingham M.D.   On: 09/24/2024 18:54   DG Abd 1 View Result Date: 09/24/2024 CLINICAL DATA:  Abdominal distension.  NG tube placement. EXAM: ABDOMEN - 1 VIEW COMPARISON:  None Available. FINDINGS: Multiple dilated gas-filled loops of small large bowel are noted. The small bowel measures up to 4 cm. Surgical clips are present over the midline. An enteric tube terminates in the stomach. Mild airspace disease is present at the left lung base. IMPRESSION: 1. Enteric tube terminates in the stomach. 2. Mildly distended loops gas-filled small and large bowel, possible postoperative ileus. 3. Mild atelectasis or infiltrate at  the left lung base. Electronically Signed   By: Leita Birmingham M.D.   On: 09/24/2024 18:28   CT ABDOMEN PELVIS W CONTRAST Addendum Date: 09/24/2024 ADDENDUM REPORT: 09/24/2024 14:17 ADDENDUM: Above findings indicate ischemic small bowel with perforation. Site of perforation unclear as may be related to the narrowed jejunal loop with twisting of the mesentery in the midline abdomen just right of midline as described. Critical Value/emergent results were called by telephone at the time of interpretation on 09/24/2024 at 2:15 pm to provider [facility] , who verbally acknowledged these results. Electronically Signed   By: Toribio Agreste M.D.   On: 09/24/2024 14:17   Result Date: 09/24/2024 CLINICAL DATA:  Shortness of breath. Abdominal pain. High probability of pulmonary embolism. EXAM: CT ANGIOGRAPHY CHEST CT ABDOMEN AND PELVIS WITH CONTRAST TECHNIQUE: Multidetector CT imaging of the chest was performed using the standard protocol during bolus administration of intravenous contrast. Multiplanar CT image reconstructions and MIPs were obtained to evaluate the vascular anatomy. Multidetector CT imaging of the abdomen and pelvis was performed using the standard protocol during bolus administration of intravenous contrast. RADIATION DOSE REDUCTION: This exam was performed according to the departmental dose-optimization program which includes automated exposure control, adjustment of the mA and/or kV according to patient size and/or use of iterative reconstruction technique. CONTRAST:  75mL OMNIPAQUE  IOHEXOL   350 MG/ML SOLN COMPARISON:  CT abdomen/pelvis 06/20/2024 FINDINGS: CTA CHEST FINDINGS Cardiovascular: Stable borderline cardiomegaly. Calcified plaque over the left anterior descending and right coronary arteries. Thoracic aorta is normal in caliber. Mild calcified plaque over the descending thoracic aorta. Pulmonary arterial system is adequately opacified without evidence of emboli. Remaining vascular structures  are unremarkable. Mediastinum/Nodes: No mediastinal or hilar adenopathy. Mild-to-moderate dilatation of the esophagus with air-fluid level. Prominent fluid-filled distal esophagus without significant change from the prior exam which may be due to reflux or dysmotility versus hiatal hernia. Lungs/Pleura: Centrilobular emphysematous disease is present. Lungs are adequately inflated. There is mild bilateral posterior airspace density which may be due to atelectasis or infection. Small to moderate bilateral pleural effusions right worse than left. Subtle hazy patchy somewhat nodular airspace opacification over the right upper lobe with minimal opacification over the posterior dependent upper lobes. Findings may be due to infection or inflammatory process and less likely underlying neoplasm. Airways are normal. Musculoskeletal: No focal abnormality. Review of the MIP images confirms the above findings. CT ABDOMEN and PELVIS FINDINGS Hepatobiliary: Several liver cysts unchanged. Gallbladder and biliary tree are unremarkable. Pancreas: Normal. Spleen: Normal. Adrenals/Urinary Tract: Adrenal glands are normal. Kidneys are normal in size without hydronephrosis or nephrolithiasis. Bilateral renal cysts unchanged. Ureters are unremarkable. Bladder minimally distended with small focus of air along the nondependent portion likely due to recent instrumentation. Stomach/Bowel: Stomach distended with fluid and air. Dilatation of the third portion of the duodenum measuring 3.2 cm in diameter. There is also dilatation of the proximal jejunum measuring 3.4 cm. The dilated duodenum becomes significantly narrowed as it crosses the midline from left to right just below the level of the aortic bifurcation. Small bowel in this focal area appears to be associated with slight twisting of the mesentery. There are multiple mid to distal small bowel loops which are dilated and demonstrate diffuse pneumatosis. Distal ileal loop measures 3.6 cm  in diameter. There is moderate fecal retention over the rectum. Stool and air-filled right colon, transverse colon and splenic flexure. There is evidence of small amount of free peritoneal air best seen over the right peripelvic region anteriorly. Appendix not definitely visualized. Vascular/Lymphatic: Moderate calcified plaque over the abdominal aorta which is normal caliber. Celiac axis and superior mesenteric artery and branches appear to be patent. Inferior mesenteric artery not well visualized. Narrowed but patent left internal iliac artery. Portal vein and superior mesenteric vein are patent. No adenopathy. Reproductive: Moderate prostatic enlargement with moderate impression upon the bladder base unchanged. Other: Moderate free fluid over the pelvis. Musculoskeletal: No focal abnormality. Review of the MIP images confirms the above findings. IMPRESSION: 1. No evidence of pulmonary embolism. 2. Small to moderate bilateral pleural effusions right worse than left with associated bibasilar posterior airspace density which may be due to atelectasis or infection. Subtle hazy patchy somewhat nodular airspace opacification over the right upper lobe with minimal opacification over the posterior dependent upper lobes. Findings may be due to infection or inflammatory process and much less likely underlying neoplasm. Recommend follow-up CT 4-6 weeks. 3. Evidence of free peritoneal air with multiple mid to distal dilated small bowel loops demonstrating moderate diffuse pneumatosis. Findings are concerning for bowel ischemia. 4. Significant narrowing of the proximal jejunum as it crosses the midline from left to right just below the level of the aortic bifurcation. Small bowel in this focal area appears to be associated with slight twisting of the mesentery. Findings are concerning for small bowel obstruction due to volvulus  or closed loop obstruction. 5. Moderate free fluid over the pelvis. 6. Moderate prostatic  enlargement with moderate impression upon the bladder base unchanged. 7. Aortic atherosclerosis. Atherosclerotic coronary artery disease. Aortic Atherosclerosis (ICD10-I70.0) and Emphysema (ICD10-J43.9). Currently patient provider. Electronically Signed: By: Toribio Agreste M.D. On: 09/24/2024 14:04   CT Angio Chest Pulmonary Embolism (PE) W or WO Contrast Addendum Date: 09/24/2024 ADDENDUM REPORT: 09/24/2024 14:17 ADDENDUM: Above findings indicate ischemic small bowel with perforation. Site of perforation unclear as may be related to the narrowed jejunal loop with twisting of the mesentery in the midline abdomen just right of midline as described. Critical Value/emergent results were called by telephone at the time of interpretation on 09/24/2024 at 2:15 pm to provider [facility] , who verbally acknowledged these results. Electronically Signed   By: Toribio Agreste M.D.   On: 09/24/2024 14:17   Result Date: 09/24/2024 CLINICAL DATA:  Shortness of breath. Abdominal pain. High probability of pulmonary embolism. EXAM: CT ANGIOGRAPHY CHEST CT ABDOMEN AND PELVIS WITH CONTRAST TECHNIQUE: Multidetector CT imaging of the chest was performed using the standard protocol during bolus administration of intravenous contrast. Multiplanar CT image reconstructions and MIPs were obtained to evaluate the vascular anatomy. Multidetector CT imaging of the abdomen and pelvis was performed using the standard protocol during bolus administration of intravenous contrast. RADIATION DOSE REDUCTION: This exam was performed according to the departmental dose-optimization program which includes automated exposure control, adjustment of the mA and/or kV according to patient size and/or use of iterative reconstruction technique. CONTRAST:  75mL OMNIPAQUE  IOHEXOL  350 MG/ML SOLN COMPARISON:  CT abdomen/pelvis 06/20/2024 FINDINGS: CTA CHEST FINDINGS Cardiovascular: Stable borderline cardiomegaly. Calcified plaque over the left anterior  descending and right coronary arteries. Thoracic aorta is normal in caliber. Mild calcified plaque over the descending thoracic aorta. Pulmonary arterial system is adequately opacified without evidence of emboli. Remaining vascular structures are unremarkable. Mediastinum/Nodes: No mediastinal or hilar adenopathy. Mild-to-moderate dilatation of the esophagus with air-fluid level. Prominent fluid-filled distal esophagus without significant change from the prior exam which may be due to reflux or dysmotility versus hiatal hernia. Lungs/Pleura: Centrilobular emphysematous disease is present. Lungs are adequately inflated. There is mild bilateral posterior airspace density which may be due to atelectasis or infection. Small to moderate bilateral pleural effusions right worse than left. Subtle hazy patchy somewhat nodular airspace opacification over the right upper lobe with minimal opacification over the posterior dependent upper lobes. Findings may be due to infection or inflammatory process and less likely underlying neoplasm. Airways are normal. Musculoskeletal: No focal abnormality. Review of the MIP images confirms the above findings. CT ABDOMEN and PELVIS FINDINGS Hepatobiliary: Several liver cysts unchanged. Gallbladder and biliary tree are unremarkable. Pancreas: Normal. Spleen: Normal. Adrenals/Urinary Tract: Adrenal glands are normal. Kidneys are normal in size without hydronephrosis or nephrolithiasis. Bilateral renal cysts unchanged. Ureters are unremarkable. Bladder minimally distended with small focus of air along the nondependent portion likely due to recent instrumentation. Stomach/Bowel: Stomach distended with fluid and air. Dilatation of the third portion of the duodenum measuring 3.2 cm in diameter. There is also dilatation of the proximal jejunum measuring 3.4 cm. The dilated duodenum becomes significantly narrowed as it crosses the midline from left to right just below the level of the aortic  bifurcation. Small bowel in this focal area appears to be associated with slight twisting of the mesentery. There are multiple mid to distal small bowel loops which are dilated and demonstrate diffuse pneumatosis. Distal ileal loop measures 3.6 cm in diameter. There  is moderate fecal retention over the rectum. Stool and air-filled right colon, transverse colon and splenic flexure. There is evidence of small amount of free peritoneal air best seen over the right peripelvic region anteriorly. Appendix not definitely visualized. Vascular/Lymphatic: Moderate calcified plaque over the abdominal aorta which is normal caliber. Celiac axis and superior mesenteric artery and branches appear to be patent. Inferior mesenteric artery not well visualized. Narrowed but patent left internal iliac artery. Portal vein and superior mesenteric vein are patent. No adenopathy. Reproductive: Moderate prostatic enlargement with moderate impression upon the bladder base unchanged. Other: Moderate free fluid over the pelvis. Musculoskeletal: No focal abnormality. Review of the MIP images confirms the above findings. IMPRESSION: 1. No evidence of pulmonary embolism. 2. Small to moderate bilateral pleural effusions right worse than left with associated bibasilar posterior airspace density which may be due to atelectasis or infection. Subtle hazy patchy somewhat nodular airspace opacification over the right upper lobe with minimal opacification over the posterior dependent upper lobes. Findings may be due to infection or inflammatory process and much less likely underlying neoplasm. Recommend follow-up CT 4-6 weeks. 3. Evidence of free peritoneal air with multiple mid to distal dilated small bowel loops demonstrating moderate diffuse pneumatosis. Findings are concerning for bowel ischemia. 4. Significant narrowing of the proximal jejunum as it crosses the midline from left to right just below the level of the aortic bifurcation. Small bowel in  this focal area appears to be associated with slight twisting of the mesentery. Findings are concerning for small bowel obstruction due to volvulus or closed loop obstruction. 5. Moderate free fluid over the pelvis. 6. Moderate prostatic enlargement with moderate impression upon the bladder base unchanged. 7. Aortic atherosclerosis. Atherosclerotic coronary artery disease. Aortic Atherosclerosis (ICD10-I70.0) and Emphysema (ICD10-J43.9). Currently patient provider. Electronically Signed: By: Toribio Agreste M.D. On: 09/24/2024 14:04   DG Abd 1 View Result Date: 09/24/2024 EXAM: 1 VIEW XRAY OF THE ABDOMEN 09/24/2024 08:39:55 AM COMPARISON: KUB dated 09/18/2024. CLINICAL HISTORY: [id number] Bloating [id number] Bloating FINDINGS: LINES, TUBES AND DEVICES: There is a feeding tube within the stomach, which has its tip in the distal stomach. BOWEL: There is diffuse gastric distention of the small and large bowels. SOFT TISSUES: No opaque urinary calculi. BONES: No acute osseous abnormality. IMPRESSION: 1. Diffuse gastric distention of the small and large bowels. No bowel obstruction. Electronically signed by: Evalene Coho MD 09/24/2024 08:49 AM EST RP Workstation: [id number]   DG CHEST PORT 1 VIEW Result Date: 09/24/2024 EXAM: 1 VIEW(S) XRAY OF THE CHEST 09/24/2024 08:39:55 AM COMPARISON: AP radiograph of the chest dated 09/21/2024. CLINICAL HISTORY: Shortness of breath. FINDINGS: LINES, TUBES AND DEVICES: A feeding tube is present with its tip in the distal stomach. LUNGS AND PLEURA: There is mild atelectasis in the left lung base. No pleural effusion. No pneumothorax. HEART AND MEDIASTINUM: No acute abnormality of the cardiac and mediastinal silhouettes. BONES AND SOFT TISSUES: No acute osseous abnormality. IMPRESSION: 1. Mild atelectasis in the left lung base. Electronically signed by: Evalene Coho MD 09/24/2024 08:48 AM EST RP Workstation: [id number]     Labs:   Basic Metabolic Panel: Recent Labs  Lab  09/30/24 0930 10/01/24 0600 10/02/24 1048 10/04/24 0453  NA 129* 128* 131* 131*  K 2.9* 2.9* 3.6 3.6  CL 96* 95* 94* 99  CO2 23 23 22  17*  GLUCOSE 81 91 92 94  BUN 7* 8 10 13   CREATININE 0.45* 0.42* 0.65 0.60*  CALCIUM  7.2* 7.5* 8.3* 7.7*  MG 1.8  --   --   --  PHOS 2.1*  --   --   --    GFR Estimated Creatinine Clearance: 47.2 mL/min (A) (by C-G formula based on SCr of 0.6 mg/dL (L)). Liver Function Tests: No results for input(s): AST, ALT, ALKPHOS, BILITOT, PROT, ALBUMIN  in the last 168 hours. No results for input(s): LIPASE, AMYLASE in the last 168 hours. No results for input(s): AMMONIA in the last 168 hours. Coagulation profile No results for input(s): INR, PROTIME in the last 168 hours.  CBC: No results for input(s): WBC, NEUTROABS, HGB, HCT, MCV, PLT in the last 168 hours.  Cardiac Enzymes: No results for input(s): CKTOTAL, CKMB, CKMBINDEX, TROPONINI in the last 168 hours. BNP: Invalid input(s): POCBNP CBG: Recent Labs  Lab 10/04/24 2357 10/05/24 0731 10/05/24 1633 10/05/24 2321 10/06/24 0756  GLUCAP 112* 97 77 80 73   D-Dimer No results for input(s): DDIMER in the last 72 hours. Hgb A1c No results for input(s): HGBA1C in the last 72 hours. Lipid Profile No results for input(s): CHOL, HDL, LDLCALC, TRIG, CHOLHDL, LDLDIRECT in the last 72 hours. Thyroid function studies No results for input(s): TSH, T4TOTAL, T3FREE, THYROIDAB in the last 72 hours.  Invalid input(s): FREET3 Anemia work up No results for input(s): VITAMINB12, FOLATE, FERRITIN, TIBC, IRON, RETICCTPCT in the last 72 hours. Microbiology No results found for this or any previous visit (from the past 240 hours).   Discharge Instructions:   Discharge Instructions     Discharge instructions   Complete by: As directed    Soft diet-- eat small frequent meals-- can use protein shakes to supplement   Discharge  instructions   Complete by: As directed    Home health Small frequent meals   Increase activity slowly   Complete by: As directed    Increase activity slowly   Complete by: As directed    No wound care   Complete by: As directed    No wound care   Complete by: As directed       Allergies as of 10/06/2024       Reactions   Haemophilus Influenzae Vaccines Other (See Comments)   I get deathly sick        Medication List     STOP taking these medications    amLODipine  5 MG tablet Commonly known as: NORVASC        TAKE these medications    docusate sodium  100 MG capsule Commonly known as: COLACE Take 1 capsule (100 mg total) by mouth 2 (two) times daily as needed for mild constipation.   lidocaine  5 % Commonly known as: LIDODERM  Place 1 patch onto the skin daily. Remove & Discard patch within 12 hours or as directed by MD   magnesium  oxide 400 (240 Mg) MG tablet Commonly known as: MAG-OX Take 0.5 tablets (200 mg total) by mouth daily.   omeprazole  40 MG capsule Commonly known as: PRILOSEC Take 40 mg by mouth daily.   polyethylene glycol 17 g packet Commonly known as: MIRALAX  / GLYCOLAX  Take 17 g by mouth daily as needed for moderate constipation.   tamsulosin  0.4 MG Caps capsule Commonly known as: FLOMAX  Take 1 capsule (0.4 mg total) by mouth daily.               Durable Medical Equipment  (From admission, onward)           Start     Ordered   10/04/24 1552  For home use only DME Bedside commode  Once  Question:  Patient needs a bedside commode to treat with the following condition  Answer:  Gait instability   10/04/24 1555   10/04/24 1552  For home use only DME standard manual wheelchair with seat cushion  Once       Comments: Patient suffers from weakness, s/p exploratory laparotomy with reduction and repair of internal hernia which impairs their ability to perform daily activities like bathing in the home.  A walker will not resolve  issue with performing activities of daily living. A wheelchair will allow patient to safely perform daily activities. Patient can safely propel the wheelchair in the home or has a caregiver who can provide assistance. Length of need Lifetime. Accessories: elevating leg rests (ELRs), wheel locks, extensions and anti-tippers.   10/04/24 1555            Contact information for follow-up providers     Surgery, [facility]. Go on 10/09/2024.   Specialty: General Surgery Why: 2:00 PM for staple removal, RN visit only. Please arrive 30 min prior to appointment time to check in. Contact information: [address] [place] [address] [phone]         Teresa Lonni HERO, MD. Go on 10/24/2024.   Specialties: General Surgery, Colon and Rectal Surgery Why: 11:20 AM for post-op follow up, please arrive 15 min prior to appointment time. Contact information: [address] [place] [address] [phone]         Care, [facility] Follow up.   Contact information: [address] [place] [address] [phone]         Noah Norleen PEDLAR, MD Follow up.   Specialty: Internal Medicine Contact information: [address] [place] [address] [phone]         Burnett Norleen, MD Follow up.   Specialty: Urology Why: call to schedule a voiding trial Contact information: [address] [place] [facility] [address] [phone]              Contact information for after-discharge care     Home Medical Care     [facility]) .   Service: Home Health Services                      Time coordinating discharge: 45 min  Signed:  Harlene RAYMOND Bowl DO  [facility] 10/06/2024, 1:50 PM

## 2024-10-06 LAB — GLUCOSE, CAPILLARY: Glucose-Capillary: 73 mg/dL (ref 70–99)

## 2024-10-06 MED ORDER — SODIUM CHLORIDE 0.9 % IV SOLN: INTRAVENOUS | Status: DC

## 2024-10-06 NOTE — Progress Notes (Signed)
 PT Cancellation Note  Patient Details Name: Noah Burnett MRN: [id number] DOB: 25-Jun-1941   Cancelled Treatment:    Reason Eval/Treat Not Completed: (P) Other (comment), pt declining mobility stating he is worn out and requesting to hold therapies. Will check back as schedule allows to continue with PT POC.  [name]. PTA Acute Rehabilitation Services Office: [phone]    [name] 10/06/2024, 1:55 PM

## 2024-10-06 NOTE — Progress Notes (Signed)
 Patient has not urinated this morning and bladder scan reveal 203 ml. We will continue monitoring. MD notified.

## 2024-10-06 NOTE — Plan of Care (Signed)
  Problem: Urinary Elimination: Goal: Signs and symptoms of infection will decrease Outcome: Progressing   Problem: Clinical Measurements: Goal: Ability to maintain clinical measurements within normal limits will improve Outcome: Progressing

## 2024-10-09 ENCOUNTER — Other Ambulatory Visit: Payer: Self-pay | Admitting: Urology

## 2024-10-09 ENCOUNTER — Encounter: Payer: Self-pay | Admitting: Urology

## 2024-10-09 DIAGNOSIS — C61 Malignant neoplasm of prostate: Secondary | ICD-10-CM

## 2024-10-09 DIAGNOSIS — R972 Elevated prostate specific antigen [PSA]: Secondary | ICD-10-CM

## 2024-10-16 DIAGNOSIS — R338 Other retention of urine: Secondary | ICD-10-CM | POA: Diagnosis not present

## 2024-11-21 ENCOUNTER — Encounter: Payer: Self-pay | Admitting: Urology

## 2024-12-07 NOTE — Progress Notes (Signed)
 PMR Admission Coordinator Pre-Admission Assessment   Patient: Noah Burnett is an 84 y.o., male MRN: [id number] DOB: 17-Apr-1941 Height: 5' 7 (170.2 cm) Weight: 49.1 kg   Insurance Information HMO: yes    PPO: yes     PCP:      IPA:      80/20:      OTHER:  PRIMARY: [employer] Medicare HMO/PPO      Policy#:  [id number]     Subscriber: Pt CM Name: Noah Burnett      Phone#: [phone]     Fax#: [phone] Pre-Cert#: [id number]  I received auth for CIR from Tiffany with [employer] for admit 09/15/24  through 09/21/24.  Updates due to 09/21/24 at fax listed above.    Employer:  Benefits:  Phone #:      Name:  Eff. Date: 11/09/20     Deduct: 0      Out of Pocket Max: 5,900 [phone] met)      Life Max: n/a CIR: $300/day for days 1-6      SNF: $10/day Outpatient: $30/ visit      Home Health: $0  DME: 20%      Providers: in network SECONDARY:       Policy#:      Phone#:    Artist:       Phone#:    The Data Processing Manager" for patients in Inpatient Rehabilitation Facilities with attached "Privacy Act Statement-Health Care Records" was provided and verbally reviewed with: Pt.   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  He had an EGD that showed hiatal hernia and compression of the second portion of duodenum with possible intraluminal neoplasm.   Biopsies were obtained which showed severely inflamed and ulcerated duodenal mucosa, but no evidence of malignancy.  A second EGD was done with ultrasound guidance but again pathology of the lesion extensive necrosis.  Surgical oncology consulted underwent robotic gastrojejunostomy on 8/25.  He did have delayed return of bowel function and a PICC line with TPN for several days until he had advancement of his diet.  Since his procedure he had had fairly steady decline with decreasing appetite significant weight loss and inability to walk.  He also had follow-up surgical oncology.  He had repeat MRI which did not show the duodenal mass.  Per chart review patient lives alone.  1 level home one-step to entry.  He has required assist from his family for ADLs and use of a rolling walker for mobility the past 2 weeks and has experienced 1 fall in the past 2 weeks.  Presented 09/08/2024 to [facility] with altered mental status and unwitnessed fall as well as reports of generalized weakness and fatigue.  In the ED abdominal x-ray showed no evidence of obstruction.  Cranial  CT scan negative for acute changes.  MRI showed multifocal subcortical contrast-enhancement, greatest at the right parietal-occipital fissure but present at multiple subcortical locations in both hemispheres.  Small areas of diffusion abnormality at the anterior left insula, left frontal operculum, right frontal operculum, and in multiple locations of both corona radiata and some lesions appearing more acute than others based on lower ADC values.  There was multiple possibilities raised by above findings considerations infectious versus inflammatory encephalitis versus vasculitis in CSF.  Patient was transferred to [facility] for further evaluation.  Chemistries unremarkable except sodium 128 potassium 3.4 chloride 90 AST 14, total CK of 30, TSH 4.632, a.m. cortisol 30, serum osmolality 257, urine sodium 137, urine osmolality 490  urinalysis negative for pyuria.  Neurology was consulted for evaluation of acute encephalopathy as well as nephrology consulted for hyponatremia.  Patient underwent fluoroscopic guided lumbar puncture and fluid sent for analysis for evaluation of meningitis encephalitis.  Procalcitonin negative, CRP 4.1.  EEG showed diffuse cerebral dysfunction no seizure seen throughout the recording.  VZV PCR was positive, varicella IgG negative.  On the morning of 09/13/2024 patient noted to have left gaze preference and decreased responsiveness code stroke was called patient was seen by neurology services.  Stat CT was done which showed left superior convexity subarachnoid hemorrhage without mass effect.  Otherwise no changes from previous CT.  CT angiogram ruled out large vessel occlusion.  Patient was initially kept n.p.o. follow-up speech therapy.  Eventually MRI completed showed prominent patchy cortical and subcortical signal abnormality throughout both hemispheres overall slight progressed from 09/09/2024 and intermediate although encephalitis and vasculitis remain leading considerations.  ID recommended continuing acyclovir.  Patient's mental status continued to improve.  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  Patient was also started on high-dose methylprednisolone by neurology 09/14/2024 with the plan to continue for 5 days followed by taper of prednisone. In regards to patient's hyponatremia felt to be chronic with nephrology follow-up possibly SIADH noted sodium in August 2025 was 129 and latest sodium 123-124  and currently remains on sodium chloride  tablets and the addition of urea  with recent worsening of hyponatremia likely attributed to recent high-dose Solu-Medrol.  The sodium level had actually worsened with normal saline and since IV fluid was required during acyclovir treatment reduce to 25 cc an hour and  received a dose of Lasix 09/19/2024 as well as 09/20/2024. Bouts of urinary retention on the evening of 09/13/2024 patient was retaining urine bladder scan for 900 cc orders given for nurse for straight caths however that was unsuccessful so coud catheter was placed and started on Flomax. AKI creatinine jumped from 0.76-1 0.66-2.07 09/14/2024 nephrology had been following at that time given IV fluids AKI presumed to be secondary to obstructive uropathy as well as contrast-induced.  AKI resolved 09/15/2024.  Patient also with bouts of hypophosphatemia supplemented with follow-up chemistries Patient was cleared to begin Lovenox for DVT prophylaxis. His diet has been advanced to mechanical soft initially requiring tube feeds for nutritional support.  Palliative care was consulted to establish goals of care.  Patient did have some persistent nausea and vomiting with KUB 09/17/2024 showing ileus.  He was having no tenderness with regular bowel movements reported. Therapy evaluations completed due to patient decreased functional mobility was admitted for a comprehensive rehab program.     Complete NIHSS TOTAL: 2   Patient's medical record from [facility] has  been reviewed by the rehabilitation admission coordinator and physician.   Past Medical History      Past Medical History:  Diagnosis Date   Arthritis     Bladder tumor     Deafness, left     Full dentures     GERD (gastroesophageal reflux disease)     History of [place] spotted fever      01-25-2020  per pt had in 2018 and was treated w/ no residual   Hyperplasia of prostate with lower urinary tract symptoms (LUTS)     Hypertension      followed by pcp   (01-25-2020  per pt had stress test approx. 2005, told normal , done in [place] somewhere)   Pre-diabetes      followed by pcp ,   watches diet   Prostate cancer [facility]) urologist--- dr watt    dx by bx01-13-2020,  Gleason 3+4 ;  MRI 12-19-2018, vol 96.26;    active survillance   Shingles     Wears glasses     Wears hearing aid in right ear            Has the patient had major surgery during 100 days prior to admission? Yes   Family History   family history is not on file.   Current Medications  Current Medications    Current Facility-Administered Medications:    0.9 %  sodium chloride  infusion, , Intravenous, Continuous, Dolan Mateo Larger, MD, Last Rate: 25 mL/hr at 09/21/24 0500, Infusion Verify at 09/21/24 0500   acetaminophen  (TYLENOL ) tablet 650 mg, 650 mg, Oral, Q6H PRN, Tat, Alm, MD, 650 mg at 09/17/24 0208   acyclovir (ZOVIRAX) 455 mg in dextrose  5 % 100 mL IVPB, 455 mg, Intravenous, Q12H, Laurence Jinnie BIRCH, RPH, Stopped at 09/20/24 2349   amLODipine (NORVASC) tablet 5 mg, 5 mg, Oral, Daily, Pahwani, Fredia, MD, 5 mg at 09/19/24 1513   Chlorhexidine Gluconate Cloth 2 % PADS 6 each, 6 each, Topical, Daily, Rathore, Vasundhra, MD, 6 each at 09/20/24 0930   chlorproMAZINE (THORAZINE) 25 mg in sodium chloride  0.9 % 25 mL IVPB, 25 mg, Intravenous, Q8H PRN, Vernon Fredia, MD, Stopped at 09/21/24 0203   enoxaparin (LOVENOX) injection 30 mg, 30 mg, Subcutaneous, Q24H, Tat, Alm, MD, 30 mg at 09/20/24 2217   feeding supplement (KATE FARMS STANDARD ENT 1.4) liquid 325 mL, 325 mL, Oral, BID BM, Pahwani, Ravi, MD, 325 mL at 09/20/24 0932   feeding supplement (OSMOLITE 1.5 CAL) liquid 1,000 mL, 1,000 mL, Per Tube, Continuous, Pahwani, Fredia, MD, Last Rate: 40 mL/hr at 09/21/24 0500, Infusion Verify at 09/21/24 0500   feeding supplement (PROSource TF20) liquid 60 mL, 60 mL, Per Tube, Daily, Pahwani, Ravi, MD, 60 mL at 09/20/24 0932   furosemide (LASIX) injection 20 mg, 20 mg, Intravenous, Once, Dolan Mateo Larger, MD   lidocaine  (XYLOCAINE ) 2 % jelly 1 Application, 1 Application, Urethral, Once, Vernon Fredia, MD   loperamide (IMODIUM) capsule 2 mg, 2 mg, Oral, PRN, Howerter, Justin B, DO, 2 mg at 09/11/24 2328   multivitamin with minerals  tablet 1 tablet, 1 tablet, Oral, Daily, Tat, David, MD, 1 tablet at 09/19/24 1044   ondansetron  (ZOFRAN ) tablet 4 mg, 4 mg, Oral, Q6H PRN **OR** ondansetron  (ZOFRAN ) injection 4 mg, 4 mg, Intravenous, Q6H PRN, Tat, David, MD, 4 mg at 09/18/24 9385   pantoprazole  (PROTONIX ) EC tablet 40 mg, 40 mg, Oral, Daily, Pahwani, Ravi, MD, 40 mg at 09/18/24 0919   polyethylene glycol (  MIRALAX / GLYCOLAX) packet 17 g, 17 g, Oral, Daily, Tat, David, MD, 17 g at 09/19/24 1043   potassium & sodium phosphates (PHOS-NAK) 280-160-250 MG packet 1 packet, 1 packet, Oral, TID AC & HS, Mansy, Jan A, MD, 1 packet at 09/21/24 0651   potassium chloride (KLOR-CON) packet 40 mEq, 40 mEq, Oral, TID, Dolan Mateo Larger, MD   rosuvastatin (CRESTOR) tablet 10 mg, 10 mg, Oral, Daily, Arora, Ashish, MD, 10 mg at 09/19/24 1044   senna (SENOKOT) tablet 17.2 mg, 2 tablet, Oral, Daily, Tat, David, MD, 17.2 mg at 09/19/24 1044   sodium chloride  tablet 2 g, 2 g, Oral, TID WC, Geralynn Charleston, MD, 2 g at 09/20/24 1709   tamsulosin (FLOMAX) capsule 0.4 mg, 0.4 mg, Oral, QPC supper, Pahwani, Ravi, MD, 0.4 mg at 09/20/24 1709   thiamine (VITAMIN B1) tablet 100 mg, 100 mg, Oral, Daily, Pahwani, Ravi, MD, 100 mg at 09/19/24 1044   urea  (URE-NA) oral packet 15 g, 15 g, Oral, BID, Dolan Mateo Larger, MD, 15 g at 09/20/24 2217   vitamin B-12 (CYANOCOBALAMIN) tablet 500 mcg, 500 mcg, Oral, Daily, Akula, Vijaya, MD, 500 mcg at 09/19/24 1043     Patients Current Diet:  Diet Order                  DIET DYS 3 Room service appropriate? Yes; Fluid consistency: Thin  Diet effective now                         Precautions / Restrictions Precautions Precautions: Fall Restrictions Weight Bearing Restrictions Per Provider Order: No    Has the patient had 2 or more falls or a fall with injury in the past year? Yes   Prior Activity Level Community (5-7x/wk): Pt. active in the community PTA   Prior Functional Level Self Care: Did the  patient need help bathing, dressing, using the toilet or eating? Independent   Indoor Mobility: Did the patient need assistance with walking from room to room (with or without device)? Independent   Stairs: Did the patient need assistance with internal or external stairs (with or without device)? Independent   Functional Cognition: Did the patient need help planning regular tasks such as shopping or remembering to take medications? Independent   Patient Information Are you of Hispanic, Latino/a,or Spanish origin?: A. No, not of Hispanic, Latino/a, or Spanish origin What is your race?: A. White Do you need or want an interpreter to communicate with a doctor or health care staff?: 0. No Patient information obtained via proxy : yes   Patient's Response To:  Health Literacy and Transportation Is the patient able to respond to health literacy and transportation needs?: No Health Literacy - How often do you need to have someone help you when you read instructions, pamphlets, or other written material from your doctor or pharmacy?: Patient unable to respond In the past 12 months, has lack of transportation kept you from medical appointments or from getting medications?: No In the past 12 months, has lack of transportation kept you from meetings, work, or from getting things needed for daily living?: No Higher Education Careers Adviser obtained via proxy: yes   Journalist, Newspaper / Equipment Home Equipment: Shower seat, Agricultural Consultant (2 wheels), Wheelchair - manual   Prior Device Use: Indicate devices/aids used by the patient prior to current illness, exacerbation or injury? Walker   Current Functional Level Cognition   Arousal/Alertness: Awake/alert Overall Cognitive Status: Impaired/Different  from baseline Orientation Level: Oriented to person, Oriented to place, Oriented to situation, Disoriented to time Attention: Sustained Sustained Attention: Impaired Sustained Attention  Impairment: Verbal basic, Functional basic Memory: Impaired Memory Impairment: Decreased short term memory, Decreased recall of new information, Retrieval deficit Decreased Short Term Memory: Verbal basic, Functional basic Awareness: Appears intact Problem Solving: Appears intact Safety/Judgment: Appears intact    Extremity Assessment (includes Sensation/Coordination)   Upper Extremity Assessment: Generalized weakness  Lower Extremity Assessment: Defer to PT evaluation     ADLs   Overall ADL's : Needs assistance/impaired Eating/Feeding: Set up Eating/Feeding Details (indicate cue type and reason): assistance opening containers Grooming: Sitting, Wash/dry hands, Wash/dry face, Set up Grooming Details (indicate cue type and reason): stood at sink with min assist and required mod assist to complete dentures care Upper Body Bathing: Minimal assistance, Sitting Upper Body Bathing Details (indicate cue type and reason): at sink Lower Body Bathing: Moderate assistance, Sit to/from stand Lower Body Bathing Details (indicate cue type and reason): assistance with peri area bathing while standing Upper Body Dressing : Minimal assistance, Sitting Upper Body Dressing Details (indicate cue type and reason): gown for back Lower Body Dressing: Maximal assistance, Sitting/lateral leans Lower Body Dressing Details (indicate cue type and reason): changed socks with patient assisting with doffing Toilet Transfer: Minimal assistance, Rolling walker (2 wheels) Toilet Transfer Details (indicate cue type and reason): 2 person HHA Toileting- Clothing Manipulation and Hygiene: Maximal assistance Functional mobility during ADLs: Minimal assistance, Moderate assistance General ADL Comments: focused on transfers and standing balance     Mobility   Overal bed mobility: Needs Assistance Bed Mobility: Supine to Sit Supine to sit: Min assist, Used rails, HOB elevated Sit to supine: Mod assist General bed  mobility comments: OOB in recliner     Transfers   Overall transfer level: Needs assistance Equipment used: Rolling walker (2 wheels) Transfers: Sit to/from Stand Sit to Stand: Min assist Bed to/from chair/wheelchair/BSC transfer type:: Step pivot Step pivot transfers: Min assist, +2 safety/equipment General transfer comment: cues for hand placement and min assist to power up     Ambulation / Gait / Stairs / Wheelchair Mobility   Ambulation/Gait Ambulation/Gait assistance: Min assist, Contact guard assist, +2 safety/equipment Gait Distance (Feet): 170 Feet Assistive device: Rolling walker (2 wheels) Gait Pattern/deviations: Step-through pattern, Decreased stride length, Trunk flexed General Gait Details: Pt demonstrates slow gait with low foot clearance despite cues. Cues for upright posture and RW management Gait velocity: decreased     Posture / Balance Dynamic Sitting Balance Sitting balance - Comments: in recliner without back support Balance Overall balance assessment: Needs assistance Sitting-balance support: Feet supported Sitting balance-Leahy Scale: Fair Sitting balance - Comments: in recliner without back support Postural control: Posterior lean, Other (comment) (anterior) Standing balance support: Single extremity supported, Bilateral upper extremity supported, No upper extremity supported, During functional activity Standing balance-Leahy Scale: Poor Standing balance comment: performd reaching tasks to simulate standing ADLs with min assist for balance     Special considerations/life events  Special service needs none    Previous Home Environment (from acute therapy documentation) Living Arrangements: Alone  Lives With: Alone Available Help at Discharge: Family, Available PRN/intermittently (they check in on him daily) Type of Home: House Home Layout: One level Home Access: Stairs to enter Entrance Stairs-Rails: None Entrance Stairs-Number of Steps: 1 Bathroom  Shower/Tub: Health Visitor: Handicapped height Bathroom Accessibility: Yes How Accessible: Accessible via walker, Accessible via wheelchair Home Care Services: No   Discharge Living  Setting Plans for Discharge Living Setting: Patient's home Type of Home at Discharge: House Discharge Home Layout: One level Discharge Home Access: Stairs to enter Entrance Stairs-Number of Steps: 1 Discharge Bathroom Shower/Tub: Walk-in shower Discharge Bathroom Toilet: Standard Discharge Bathroom Accessibility: Yes How Accessible: Accessible via walker, Accessible via wheelchair Does the patient have any problems obtaining your medications?: No   Social/Family/Support Systems Patient Roles: Other (Comment) Contact Information: [phone] Anticipated Caregiver: [name] Wingate Ability/Limitations of Caregiver: Daughter and other family can do 24/7 min a Caregiver Availability: 24/7 Discharge Plan Discussed with Primary Caregiver: Yes Is Caregiver In Agreement with Plan?: Yes Does Caregiver/Family have Issues with Lodging/Transportation while Pt is in Rehab?: No   Goals Patient/Family Goal for Rehab: PT/OT min A, SLP mod A Expected length of stay: 12-14 days Pt/Family Agrees to Admission and willing to participate: Yes Program Orientation Provided & Reviewed with Pt/Caregiver Including Roles  & Responsibilities: Yes   Decrease burden of Care through IP rehab admission: Not anticipated   Possible need for SNF placement upon discharge: Not anticipated   Patient Condition: I have reviewed medical records from [facility], spoken with CM, and patient and daughter. I met with patient at the bedside for inpatient rehabilitation assessment.  Patient will benefit from ongoing PT, OT, and SLP, can actively participate in 3 hours of therapy a day 5 days of the week, and can make measurable gains during the admission.  Patient will also benefit from the coordinated team approach  during an Inpatient Acute Rehabilitation admission.  The patient will receive intensive therapy as well as Rehabilitation physician, nursing, social worker, and care management interventions.  Due to safety, skin/wound care, disease management, medication administration, pain management, and patient education the patient requires 24 hour a day rehabilitation nursing.  The patient is currently Min A-mod A with mobility and basic ADLs.  Discharge setting and therapy post discharge at home with home health is anticipated.  Patient has agreed to participate in the Acute Inpatient Rehabilitation Program and will admit today.   Preadmission Screen Completed By:  Leita KATHEE Kleine, 09/21/2024 9:37 AM ______________________________________________________________________   Discussed status with Dr. Emeline on 09/21/24 at 938 and received approval for admission today.   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 Yes Co-Morbidities requiring supervision/potential complications: Multifocal CVAs, metabolic encephalopathy, hyponatremia with chronic SIADH, urinary retention, AKI, Duodenal ulcer s/p gastrojejunostomy 8/25, ileaus, hypertension, electrolyte deficiencies, and shingles  Due to bladder management, safety, skin/wound care, disease management, medication administration, pain management, and patient education, does the patient require 24 hr/day rehab nursing? Yes Does the patient require coordinated care of a physician, rehab nurse, PT, OT, and SLP to address physical and functional deficits in the context of the above medical diagnosis(es)? Yes Addressing deficits in the following areas: balance, endurance, locomotion, strength,  transferring, bowel/bladder control, bathing, dressing, feeding, grooming, toileting, and cognition Can the patient actively participate in an intensive therapy program of at least 3 hrs of therapy 5 days a week? Yes The potential for patient to make measurable gains while on inpatient rehab is good Anticipated functional outcomes upon discharge from inpatient rehab: min assist PT, min assist OT, mod assist SLP Estimated rehab length of stay to reach the above  functional goals is: 12-14 days Anticipated discharge destination: Home 10. Overall Rehab/Functional Prognosis: good     MD Signature:   Joesph JAYSON Likes, DO 09/21/2024  Revision History  Date/Time User Provider Type Action  09/21/2024 10:24 AM Likes Joesph JAYSON, DO Physician Sign  09/21/2024  9:38 AM Cecilie Leita NOVAK, CCC-SLP Rehab Admission Coordinator Share  09/20/2024  2:58 PM Cecilie Leita NOVAK RAEJEAN Rehab Admission Coordinator Share  09/17/2024  9:04 AM Cecilie Leita NOVAK, CCC-SLP Rehab Admission Coordinator Share  09/11/2024  2:04 PM Cecilie Leita NOVAK RAEJEAN Rehab Admission Coordinator Share  09/11/2024  1:43 PM Cecilie Leita NOVAK RAEJEAN Rehab Admission Coordinator

## 2024-12-07 NOTE — Progress Notes (Signed)
 PMR Admission Coordinator Pre-Admission Assessment   Patient: Noah Burnett is an 84 y.o., male MRN: [id number] DOB: 17-Apr-1941 Height: 5' 7 (170.2 cm) Weight: 49.1 kg   Insurance Information HMO: yes    PPO: yes     PCP:      IPA:      80/20:      OTHER:  PRIMARY: [employer] Medicare HMO/PPO      Policy#:  [id number]     Subscriber: Pt CM Name: Noah Burnett      Phone#: [phone]     Fax#: [phone] Pre-Cert#: [id number]  I received auth for CIR from Tiffany with [employer] for admit 09/15/24  through 09/21/24.  Updates due to 09/21/24 at fax listed above.    Employer:  Benefits:  Phone #:      Name:  Eff. Date: 11/09/20     Deduct: 0      Out of Pocket Max: 5,900 [phone] met)      Life Max: n/a CIR: $300/day for days 1-6      SNF: $10/day Outpatient: $30/ visit      Home Health: $0  DME: 20%      Providers: in network SECONDARY:       Policy#:      Phone#:    Artist:       Phone#:    The Data Processing Manager" for patients in Inpatient Rehabilitation Facilities with attached "Privacy Act Statement-Health Care Records" was provided and verbally reviewed with: Pt.   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  He had an EGD that showed hiatal hernia and compression of the second portion of duodenum with possible intraluminal neoplasm.   Biopsies were obtained which showed severely inflamed and ulcerated duodenal mucosa, but no evidence of malignancy.  A second EGD was done with ultrasound guidance but again pathology of the lesion extensive necrosis.  Surgical oncology consulted underwent robotic gastrojejunostomy on 8/25.  He did have delayed return of bowel function and a PICC line with TPN for several days until he had advancement of his diet.  Since his procedure he had had fairly steady decline with decreasing appetite significant weight loss and inability to walk.  He also had follow-up surgical oncology.  He had repeat MRI which did not show the duodenal mass.  Per chart review patient lives alone.  1 level home one-step to entry.  He has required assist from his family for ADLs and use of a rolling walker for mobility the past 2 weeks and has experienced 1 fall in the past 2 weeks.  Presented 09/08/2024 to [facility] with altered mental status and unwitnessed fall as well as reports of generalized weakness and fatigue.  In the ED abdominal x-ray showed no evidence of obstruction.  Cranial  CT scan negative for acute changes.  MRI showed multifocal subcortical contrast-enhancement, greatest at the right parietal-occipital fissure but present at multiple subcortical locations in both hemispheres.  Small areas of diffusion abnormality at the anterior left insula, left frontal operculum, right frontal operculum, and in multiple locations of both corona radiata and some lesions appearing more acute than others based on lower ADC values.  There was multiple possibilities raised by above findings considerations infectious versus inflammatory encephalitis versus vasculitis in CSF.  Patient was transferred to [facility] for further evaluation.  Chemistries unremarkable except sodium 128 potassium 3.4 chloride 90 AST 14, total CK of 30, TSH 4.632, a.m. cortisol 30, serum osmolality 257, urine sodium 137, urine osmolality 490  urinalysis negative for pyuria.  Neurology was consulted for evaluation of acute encephalopathy as well as nephrology consulted for hyponatremia.  Patient underwent fluoroscopic guided lumbar puncture and fluid sent for analysis for evaluation of meningitis encephalitis.  Procalcitonin negative, CRP 4.1.  EEG showed diffuse cerebral dysfunction no seizure seen throughout the recording.  VZV PCR was positive, varicella IgG negative.  On the morning of 09/13/2024 patient noted to have left gaze preference and decreased responsiveness code stroke was called patient was seen by neurology services.  Stat CT was done which showed left superior convexity subarachnoid hemorrhage without mass effect.  Otherwise no changes from previous CT.  CT angiogram ruled out large vessel occlusion.  Patient was initially kept n.p.o. follow-up speech therapy.  Eventually MRI completed showed prominent patchy cortical and subcortical signal abnormality throughout both hemispheres overall slight progressed from 09/09/2024 and intermediate although encephalitis and vasculitis remain leading considerations.  ID recommended continuing acyclovir.  Patient's mental status continued to improve.  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  Patient was also started on high-dose methylprednisolone by neurology 09/14/2024 with the plan to continue for 5 days followed by taper of prednisone. In regards to patient's hyponatremia felt to be chronic with nephrology follow-up possibly SIADH noted sodium in August 2025 was 129 and latest sodium 123-124  and currently remains on sodium chloride  tablets and the addition of urea  with recent worsening of hyponatremia likely attributed to recent high-dose Solu-Medrol.  The sodium level had actually worsened with normal saline and since IV fluid was required during acyclovir treatment reduce to 25 cc an hour and  received a dose of Lasix 09/19/2024 as well as 09/20/2024. Bouts of urinary retention on the evening of 09/13/2024 patient was retaining urine bladder scan for 900 cc orders given for nurse for straight caths however that was unsuccessful so coud catheter was placed and started on Flomax. AKI creatinine jumped from 0.76-1 0.66-2.07 09/14/2024 nephrology had been following at that time given IV fluids AKI presumed to be secondary to obstructive uropathy as well as contrast-induced.  AKI resolved 09/15/2024.  Patient also with bouts of hypophosphatemia supplemented with follow-up chemistries Patient was cleared to begin Lovenox for DVT prophylaxis. His diet has been advanced to mechanical soft initially requiring tube feeds for nutritional support.  Palliative care was consulted to establish goals of care.  Patient did have some persistent nausea and vomiting with KUB 09/17/2024 showing ileus.  He was having no tenderness with regular bowel movements reported. Therapy evaluations completed due to patient decreased functional mobility was admitted for a comprehensive rehab program.     Complete NIHSS TOTAL: 2   Patient's medical record from [facility] has  been reviewed by the rehabilitation admission coordinator and physician.   Past Medical History      Past Medical History:  Diagnosis Date   Arthritis     Bladder tumor     Deafness, left     Full dentures     GERD (gastroesophageal reflux disease)     History of [place] spotted fever      01-25-2020  per pt had in 2018 and was treated w/ no residual   Hyperplasia of prostate with lower urinary tract symptoms (LUTS)     Hypertension      followed by pcp   (01-25-2020  per pt had stress test approx. 2005, told normal , done in [place] somewhere)   Pre-diabetes      followed by pcp ,   watches diet   Prostate cancer [facility]) urologist--- dr watt    dx by bx01-13-2020,  Gleason 3+4 ;  MRI 12-19-2018, vol 96.26;    active survillance   Shingles     Wears glasses     Wears hearing aid in right ear            Has the patient had major surgery during 100 days prior to admission? Yes   Family History   family history is not on file.   Current Medications  Current Medications    Current Facility-Administered Medications:    0.9 %  sodium chloride  infusion, , Intravenous, Continuous, Dolan Mateo Larger, MD, Last Rate: 25 mL/hr at 09/21/24 0500, Infusion Verify at 09/21/24 0500   acetaminophen  (TYLENOL ) tablet 650 mg, 650 mg, Oral, Q6H PRN, Tat, Alm, MD, 650 mg at 09/17/24 0208   acyclovir (ZOVIRAX) 455 mg in dextrose  5 % 100 mL IVPB, 455 mg, Intravenous, Q12H, Laurence Jinnie BIRCH, RPH, Stopped at 09/20/24 2349   amLODipine (NORVASC) tablet 5 mg, 5 mg, Oral, Daily, Pahwani, Fredia, MD, 5 mg at 09/19/24 1513   Chlorhexidine Gluconate Cloth 2 % PADS 6 each, 6 each, Topical, Daily, Rathore, Vasundhra, MD, 6 each at 09/20/24 0930   chlorproMAZINE (THORAZINE) 25 mg in sodium chloride  0.9 % 25 mL IVPB, 25 mg, Intravenous, Q8H PRN, Vernon Fredia, MD, Stopped at 09/21/24 0203   enoxaparin (LOVENOX) injection 30 mg, 30 mg, Subcutaneous, Q24H, Tat, Alm, MD, 30 mg at 09/20/24 2217   feeding supplement (KATE FARMS STANDARD ENT 1.4) liquid 325 mL, 325 mL, Oral, BID BM, Pahwani, Ravi, MD, 325 mL at 09/20/24 0932   feeding supplement (OSMOLITE 1.5 CAL) liquid 1,000 mL, 1,000 mL, Per Tube, Continuous, Pahwani, Fredia, MD, Last Rate: 40 mL/hr at 09/21/24 0500, Infusion Verify at 09/21/24 0500   feeding supplement (PROSource TF20) liquid 60 mL, 60 mL, Per Tube, Daily, Pahwani, Ravi, MD, 60 mL at 09/20/24 0932   furosemide (LASIX) injection 20 mg, 20 mg, Intravenous, Once, Dolan Mateo Larger, MD   lidocaine  (XYLOCAINE ) 2 % jelly 1 Application, 1 Application, Urethral, Once, Vernon Fredia, MD   loperamide (IMODIUM) capsule 2 mg, 2 mg, Oral, PRN, Howerter, Justin B, DO, 2 mg at 09/11/24 2328   multivitamin with minerals  tablet 1 tablet, 1 tablet, Oral, Daily, Tat, David, MD, 1 tablet at 09/19/24 1044   ondansetron  (ZOFRAN ) tablet 4 mg, 4 mg, Oral, Q6H PRN **OR** ondansetron  (ZOFRAN ) injection 4 mg, 4 mg, Intravenous, Q6H PRN, Tat, David, MD, 4 mg at 09/18/24 9385   pantoprazole  (PROTONIX ) EC tablet 40 mg, 40 mg, Oral, Daily, Pahwani, Ravi, MD, 40 mg at 09/18/24 0919   polyethylene glycol (  MIRALAX / GLYCOLAX) packet 17 g, 17 g, Oral, Daily, Tat, David, MD, 17 g at 09/19/24 1043   potassium & sodium phosphates (PHOS-NAK) 280-160-250 MG packet 1 packet, 1 packet, Oral, TID AC & HS, Mansy, Jan A, MD, 1 packet at 09/21/24 0651   potassium chloride (KLOR-CON) packet 40 mEq, 40 mEq, Oral, TID, Dolan Mateo Larger, MD   rosuvastatin (CRESTOR) tablet 10 mg, 10 mg, Oral, Daily, Arora, Ashish, MD, 10 mg at 09/19/24 1044   senna (SENOKOT) tablet 17.2 mg, 2 tablet, Oral, Daily, Tat, David, MD, 17.2 mg at 09/19/24 1044   sodium chloride  tablet 2 g, 2 g, Oral, TID WC, Geralynn Charleston, MD, 2 g at 09/20/24 1709   tamsulosin (FLOMAX) capsule 0.4 mg, 0.4 mg, Oral, QPC supper, Pahwani, Ravi, MD, 0.4 mg at 09/20/24 1709   thiamine (VITAMIN B1) tablet 100 mg, 100 mg, Oral, Daily, Pahwani, Ravi, MD, 100 mg at 09/19/24 1044   urea  (URE-NA) oral packet 15 g, 15 g, Oral, BID, Dolan Mateo Larger, MD, 15 g at 09/20/24 2217   vitamin B-12 (CYANOCOBALAMIN) tablet 500 mcg, 500 mcg, Oral, Daily, Akula, Vijaya, MD, 500 mcg at 09/19/24 1043     Patients Current Diet:  Diet Order                  DIET DYS 3 Room service appropriate? Yes; Fluid consistency: Thin  Diet effective now                         Precautions / Restrictions Precautions Precautions: Fall Restrictions Weight Bearing Restrictions Per Provider Order: No    Has the patient had 2 or more falls or a fall with injury in the past year? Yes   Prior Activity Level Community (5-7x/wk): Pt. active in the community PTA   Prior Functional Level Self Care: Did the  patient need help bathing, dressing, using the toilet or eating? Independent   Indoor Mobility: Did the patient need assistance with walking from room to room (with or without device)? Independent   Stairs: Did the patient need assistance with internal or external stairs (with or without device)? Independent   Functional Cognition: Did the patient need help planning regular tasks such as shopping or remembering to take medications? Independent   Patient Information Are you of Hispanic, Latino/a,or Spanish origin?: A. No, not of Hispanic, Latino/a, or Spanish origin What is your race?: A. White Do you need or want an interpreter to communicate with a doctor or health care staff?: 0. No Patient information obtained via proxy : yes   Patient's Response To:  Health Literacy and Transportation Is the patient able to respond to health literacy and transportation needs?: No Health Literacy - How often do you need to have someone help you when you read instructions, pamphlets, or other written material from your doctor or pharmacy?: Patient unable to respond In the past 12 months, has lack of transportation kept you from medical appointments or from getting medications?: No In the past 12 months, has lack of transportation kept you from meetings, work, or from getting things needed for daily living?: No Higher Education Careers Adviser obtained via proxy: yes   Journalist, Newspaper / Equipment Home Equipment: Shower seat, Agricultural Consultant (2 wheels), Wheelchair - manual   Prior Device Use: Indicate devices/aids used by the patient prior to current illness, exacerbation or injury? Walker   Current Functional Level Cognition   Arousal/Alertness: Awake/alert Overall Cognitive Status: Impaired/Different  from baseline Orientation Level: Oriented to person, Oriented to place, Oriented to situation, Disoriented to time Attention: Sustained Sustained Attention: Impaired Sustained Attention  Impairment: Verbal basic, Functional basic Memory: Impaired Memory Impairment: Decreased short term memory, Decreased recall of new information, Retrieval deficit Decreased Short Term Memory: Verbal basic, Functional basic Awareness: Appears intact Problem Solving: Appears intact Safety/Judgment: Appears intact    Extremity Assessment (includes Sensation/Coordination)   Upper Extremity Assessment: Generalized weakness  Lower Extremity Assessment: Defer to PT evaluation     ADLs   Overall ADL's : Needs assistance/impaired Eating/Feeding: Set up Eating/Feeding Details (indicate cue type and reason): assistance opening containers Grooming: Sitting, Wash/dry hands, Wash/dry face, Set up Grooming Details (indicate cue type and reason): stood at sink with min assist and required mod assist to complete dentures care Upper Body Bathing: Minimal assistance, Sitting Upper Body Bathing Details (indicate cue type and reason): at sink Lower Body Bathing: Moderate assistance, Sit to/from stand Lower Body Bathing Details (indicate cue type and reason): assistance with peri area bathing while standing Upper Body Dressing : Minimal assistance, Sitting Upper Body Dressing Details (indicate cue type and reason): gown for back Lower Body Dressing: Maximal assistance, Sitting/lateral leans Lower Body Dressing Details (indicate cue type and reason): changed socks with patient assisting with doffing Toilet Transfer: Minimal assistance, Rolling walker (2 wheels) Toilet Transfer Details (indicate cue type and reason): 2 person HHA Toileting- Clothing Manipulation and Hygiene: Maximal assistance Functional mobility during ADLs: Minimal assistance, Moderate assistance General ADL Comments: focused on transfers and standing balance     Mobility   Overal bed mobility: Needs Assistance Bed Mobility: Supine to Sit Supine to sit: Min assist, Used rails, HOB elevated Sit to supine: Mod assist General bed  mobility comments: OOB in recliner     Transfers   Overall transfer level: Needs assistance Equipment used: Rolling walker (2 wheels) Transfers: Sit to/from Stand Sit to Stand: Min assist Bed to/from chair/wheelchair/BSC transfer type:: Step pivot Step pivot transfers: Min assist, +2 safety/equipment General transfer comment: cues for hand placement and min assist to power up     Ambulation / Gait / Stairs / Wheelchair Mobility   Ambulation/Gait Ambulation/Gait assistance: Min assist, Contact guard assist, +2 safety/equipment Gait Distance (Feet): 170 Feet Assistive device: Rolling walker (2 wheels) Gait Pattern/deviations: Step-through pattern, Decreased stride length, Trunk flexed General Gait Details: Pt demonstrates slow gait with low foot clearance despite cues. Cues for upright posture and RW management Gait velocity: decreased     Posture / Balance Dynamic Sitting Balance Sitting balance - Comments: in recliner without back support Balance Overall balance assessment: Needs assistance Sitting-balance support: Feet supported Sitting balance-Leahy Scale: Fair Sitting balance - Comments: in recliner without back support Postural control: Posterior lean, Other (comment) (anterior) Standing balance support: Single extremity supported, Bilateral upper extremity supported, No upper extremity supported, During functional activity Standing balance-Leahy Scale: Poor Standing balance comment: performd reaching tasks to simulate standing ADLs with min assist for balance     Special considerations/life events  Special service needs none    Previous Home Environment (from acute therapy documentation) Living Arrangements: Alone  Lives With: Alone Available Help at Discharge: Family, Available PRN/intermittently (they check in on him daily) Type of Home: House Home Layout: One level Home Access: Stairs to enter Entrance Stairs-Rails: None Entrance Stairs-Number of Steps: 1 Bathroom  Shower/Tub: Health Visitor: Handicapped height Bathroom Accessibility: Yes How Accessible: Accessible via walker, Accessible via wheelchair Home Care Services: No   Discharge Living  Setting Plans for Discharge Living Setting: Patient's home Type of Home at Discharge: House Discharge Home Layout: One level Discharge Home Access: Stairs to enter Entrance Stairs-Number of Steps: 1 Discharge Bathroom Shower/Tub: Walk-in shower Discharge Bathroom Toilet: Standard Discharge Bathroom Accessibility: Yes How Accessible: Accessible via walker, Accessible via wheelchair Does the patient have any problems obtaining your medications?: No   Social/Family/Support Systems Patient Roles: Other (Comment) Contact Information: [phone] Anticipated Caregiver: [name] Wingate Ability/Limitations of Caregiver: Daughter and other family can do 24/7 min a Caregiver Availability: 24/7 Discharge Plan Discussed with Primary Caregiver: Yes Is Caregiver In Agreement with Plan?: Yes Does Caregiver/Family have Issues with Lodging/Transportation while Pt is in Rehab?: No   Goals Patient/Family Goal for Rehab: PT/OT min A, SLP mod A Expected length of stay: 12-14 days Pt/Family Agrees to Admission and willing to participate: Yes Program Orientation Provided & Reviewed with Pt/Caregiver Including Roles  & Responsibilities: Yes   Decrease burden of Care through IP rehab admission: Not anticipated   Possible need for SNF placement upon discharge: Not anticipated   Patient Condition: I have reviewed medical records from [facility], spoken with CM, and patient and daughter. I met with patient at the bedside for inpatient rehabilitation assessment.  Patient will benefit from ongoing PT, OT, and SLP, can actively participate in 3 hours of therapy a day 5 days of the week, and can make measurable gains during the admission.  Patient will also benefit from the coordinated team approach  during an Inpatient Acute Rehabilitation admission.  The patient will receive intensive therapy as well as Rehabilitation physician, nursing, social worker, and care management interventions.  Due to safety, skin/wound care, disease management, medication administration, pain management, and patient education the patient requires 24 hour a day rehabilitation nursing.  The patient is currently Min A-mod A with mobility and basic ADLs.  Discharge setting and therapy post discharge at home with home health is anticipated.  Patient has agreed to participate in the Acute Inpatient Rehabilitation Program and will admit today.   Preadmission Screen Completed By:  Leita KATHEE Kleine, 09/21/2024 9:37 AM ______________________________________________________________________   Discussed status with Dr. Emeline on 09/21/24 at 938 and received approval for admission today.   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 Yes Co-Morbidities requiring supervision/potential complications: Multifocal CVAs, metabolic encephalopathy, hyponatremia with chronic SIADH, urinary retention, AKI, Duodenal ulcer s/p gastrojejunostomy 8/25, ileaus, hypertension, electrolyte deficiencies, and shingles  Due to bladder management, safety, skin/wound care, disease management, medication administration, pain management, and patient education, does the patient require 24 hr/day rehab nursing? Yes Does the patient require coordinated care of a physician, rehab nurse, PT, OT, and SLP to address physical and functional deficits in the context of the above medical diagnosis(es)? Yes Addressing deficits in the following areas: balance, endurance, locomotion, strength,  transferring, bowel/bladder control, bathing, dressing, feeding, grooming, toileting, and cognition Can the patient actively participate in an intensive therapy program of at least 3 hrs of therapy 5 days a week? Yes The potential for patient to make measurable gains while on inpatient rehab is good Anticipated functional outcomes upon discharge from inpatient rehab: min assist PT, min assist OT, mod assist SLP Estimated rehab length of stay to reach the above  functional goals is: 12-14 days Anticipated discharge destination: Home 10. Overall Rehab/Functional Prognosis: good     MD Signature:   Joesph JAYSON Likes, DO 09/21/2024  Revision History  Date/Time User Provider Type Action  09/21/2024 10:24 AM Likes Joesph JAYSON, DO Physician Sign  09/21/2024  9:38 AM Cecilie Leita NOVAK, CCC-SLP Rehab Admission Coordinator Share  09/20/2024  2:58 PM Cecilie Leita NOVAK RAEJEAN Rehab Admission Coordinator Share  09/17/2024  9:04 AM Cecilie Leita NOVAK, CCC-SLP Rehab Admission Coordinator Share  09/11/2024  2:04 PM Cecilie Leita NOVAK RAEJEAN Rehab Admission Coordinator Share  09/11/2024  1:43 PM Cecilie Leita NOVAK RAEJEAN Rehab Admission Coordinator

## 2024-12-10 DEATH — deceased

## 2024-12-22 ENCOUNTER — Ambulatory Visit: Admitting: Diagnostic Neuroimaging
